# Patient Record
Sex: Male | Born: 1970 | Race: White | Hispanic: No | Marital: Single | Smoking: Former smoker
Health system: Southern US, Community
[De-identification: ages and names within clinical notes are randomized; demographics above are authoritative.]

## PROBLEM LIST (undated history)

## (undated) DIAGNOSIS — F419 Anxiety disorder, unspecified: Secondary | ICD-10-CM

## (undated) DIAGNOSIS — F32A Depression, unspecified: Secondary | ICD-10-CM

## (undated) DIAGNOSIS — E119 Type 2 diabetes mellitus without complications: Secondary | ICD-10-CM

## (undated) DIAGNOSIS — F329 Major depressive disorder, single episode, unspecified: Secondary | ICD-10-CM

## (undated) DIAGNOSIS — I350 Nonrheumatic aortic (valve) stenosis: Secondary | ICD-10-CM

## (undated) DIAGNOSIS — I509 Heart failure, unspecified: Secondary | ICD-10-CM

## (undated) DIAGNOSIS — I4891 Unspecified atrial fibrillation: Secondary | ICD-10-CM

## (undated) DIAGNOSIS — I10 Essential (primary) hypertension: Secondary | ICD-10-CM

## (undated) HISTORY — PX: CARDIAC VALVE REPLACEMENT: SHX585

## (undated) HISTORY — PX: HERNIA REPAIR: SHX51

## (undated) HISTORY — PX: GREATER OMENTAL FLAP CLOSURE: SHX6319

## (undated) HISTORY — PX: OTHER SURGICAL HISTORY: SHX169

## (undated) HISTORY — PX: VALVE REPLACEMENT: SUR13

## (undated) HISTORY — PX: APPLICATION OF WOUND VAC: SHX5189

---

## 2011-08-07 DIAGNOSIS — T8579XA Infection and inflammatory reaction due to other internal prosthetic devices, implants and grafts, initial encounter: Secondary | ICD-10-CM | POA: Insufficient documentation

## 2012-08-18 ENCOUNTER — Emergency Department: Payer: Self-pay | Admitting: Emergency Medicine

## 2012-08-18 LAB — CBC
HCT: 40.9 % (ref 40.0–52.0)
HGB: 13.8 g/dL (ref 13.0–18.0)
MCH: 27.6 pg (ref 26.0–34.0)
MCHC: 33.9 g/dL (ref 32.0–36.0)
Platelet: 257 10*3/uL (ref 150–440)
RDW: 14.3 % (ref 11.5–14.5)

## 2012-08-18 LAB — COMPREHENSIVE METABOLIC PANEL
Albumin: 3.9 g/dL (ref 3.4–5.0)
Alkaline Phosphatase: 69 U/L (ref 50–136)
Anion Gap: 9 (ref 7–16)
BUN: 8 mg/dL (ref 7–18)
Bilirubin,Total: 0.4 mg/dL (ref 0.2–1.0)
Chloride: 104 mmol/L (ref 98–107)
Co2: 24 mmol/L (ref 21–32)
EGFR (Non-African Amer.): >60
Osmolality: 271 (ref 275–301)
Potassium: 3.3 mmol/L — ABNORMAL LOW (ref 3.5–5.1)
SGOT(AST): 13 U/L — ABNORMAL LOW (ref 15–37)
Total Protein: 7.2 g/dL (ref 6.4–8.2)

## 2012-08-18 LAB — TROPONIN I: Troponin-I: <0.02 ng/mL

## 2012-08-19 LAB — TROPONIN I: Troponin-I: <0.02 ng/mL

## 2015-03-18 ENCOUNTER — Inpatient Hospital Stay
Admit: 2015-03-18 | Discharge: 2015-03-18 | Disposition: A | Discharge status: Home / Self Care | Payer: 59 | Attending: Cardiovascular Disease | Admitting: Cardiovascular Disease

## 2015-03-18 ENCOUNTER — Inpatient Hospital Stay
Admission: EM | Admit: 2015-03-18 | Discharge: 2015-03-19 | DRG: 292 | Disposition: A | Discharge status: Home / Self Care | Payer: 59 | Attending: Internal Medicine | Admitting: Internal Medicine

## 2015-03-18 ENCOUNTER — Encounter: Payer: Self-pay | Admitting: Emergency Medicine

## 2015-03-18 ENCOUNTER — Emergency Department: Payer: 59

## 2015-03-18 DIAGNOSIS — F17210 Nicotine dependence, cigarettes, uncomplicated: Secondary | ICD-10-CM | POA: Diagnosis present

## 2015-03-18 DIAGNOSIS — Z79899 Other long term (current) drug therapy: Secondary | ICD-10-CM | POA: Diagnosis not present

## 2015-03-18 DIAGNOSIS — R7989 Other specified abnormal findings of blood chemistry: Secondary | ICD-10-CM

## 2015-03-18 DIAGNOSIS — R0600 Dyspnea, unspecified: Secondary | ICD-10-CM

## 2015-03-18 DIAGNOSIS — E089 Diabetes mellitus due to underlying condition without complications: Secondary | ICD-10-CM

## 2015-03-18 DIAGNOSIS — I352 Nonrheumatic aortic (valve) stenosis with insufficiency: Secondary | ICD-10-CM | POA: Diagnosis present

## 2015-03-18 DIAGNOSIS — I959 Hypotension, unspecified: Secondary | ICD-10-CM | POA: Diagnosis present

## 2015-03-18 DIAGNOSIS — M7989 Other specified soft tissue disorders: Secondary | ICD-10-CM

## 2015-03-18 DIAGNOSIS — Z6841 Body Mass Index (BMI) 40.0 and over, adult: Secondary | ICD-10-CM

## 2015-03-18 DIAGNOSIS — F17200 Nicotine dependence, unspecified, uncomplicated: Secondary | ICD-10-CM | POA: Diagnosis present

## 2015-03-18 DIAGNOSIS — F419 Anxiety disorder, unspecified: Secondary | ICD-10-CM | POA: Diagnosis present

## 2015-03-18 DIAGNOSIS — I248 Other forms of acute ischemic heart disease: Secondary | ICD-10-CM | POA: Diagnosis present

## 2015-03-18 DIAGNOSIS — R0601 Orthopnea: Secondary | ICD-10-CM

## 2015-03-18 DIAGNOSIS — I110 Hypertensive heart disease with heart failure: Secondary | ICD-10-CM | POA: Diagnosis not present

## 2015-03-18 DIAGNOSIS — E119 Type 2 diabetes mellitus without complications: Secondary | ICD-10-CM | POA: Diagnosis present

## 2015-03-18 DIAGNOSIS — G4730 Sleep apnea, unspecified: Secondary | ICD-10-CM | POA: Diagnosis present

## 2015-03-18 DIAGNOSIS — I5031 Acute diastolic (congestive) heart failure: Secondary | ICD-10-CM | POA: Insufficient documentation

## 2015-03-18 DIAGNOSIS — I509 Heart failure, unspecified: Secondary | ICD-10-CM

## 2015-03-18 DIAGNOSIS — Z7984 Long term (current) use of oral hypoglycemic drugs: Secondary | ICD-10-CM

## 2015-03-18 DIAGNOSIS — E785 Hyperlipidemia, unspecified: Secondary | ICD-10-CM | POA: Diagnosis present

## 2015-03-18 DIAGNOSIS — E66813 Obesity, class 3: Secondary | ICD-10-CM

## 2015-03-18 DIAGNOSIS — R778 Other specified abnormalities of plasma proteins: Secondary | ICD-10-CM

## 2015-03-18 DIAGNOSIS — Z720 Tobacco use: Secondary | ICD-10-CM

## 2015-03-18 HISTORY — DX: Type 2 diabetes mellitus without complications: E11.9

## 2015-03-18 HISTORY — DX: Essential (primary) hypertension: I10

## 2015-03-18 HISTORY — DX: Anxiety disorder, unspecified: F41.9

## 2015-03-18 LAB — CBC
HEMATOCRIT: 37.6 % — AB (ref 40.0–52.0)
HEMOGLOBIN: 12 g/dL — AB (ref 13.0–18.0)
MCH: 26 pg (ref 26.0–34.0)
MCHC: 32 g/dL (ref 32.0–36.0)
MCV: 81.2 fL (ref 80.0–100.0)
PLATELETS: 293 10*3/uL (ref 150–440)
RBC: 4.63 MIL/uL (ref 4.40–5.90)
RDW: 17 % — AB (ref 11.5–14.5)
WBC: 9.6 10*3/uL (ref 3.8–10.6)

## 2015-03-18 LAB — COMPREHENSIVE METABOLIC PANEL
ALBUMIN: 4 g/dL (ref 3.5–5.0)
ALK PHOS: 64 U/L (ref 38–126)
ALT: 28 U/L (ref 17–63)
ANION GAP: 8 (ref 5–15)
AST: 19 U/L (ref 15–41)
BILIRUBIN TOTAL: 0.9 mg/dL (ref 0.3–1.2)
BUN: 15 mg/dL (ref 6–20)
CALCIUM: 9.2 mg/dL (ref 8.9–10.3)
CO2: 25 mmol/L (ref 22–32)
Chloride: 106 mmol/L (ref 101–111)
Creatinine, Ser: 0.55 mg/dL — ABNORMAL LOW (ref 0.61–1.24)
Glucose, Bld: 114 mg/dL — ABNORMAL HIGH (ref 65–99)
POTASSIUM: 4 mmol/L (ref 3.5–5.1)
Sodium: 139 mmol/L (ref 135–145)
TOTAL PROTEIN: 6.9 g/dL (ref 6.5–8.1)

## 2015-03-18 LAB — FIBRIN DERIVATIVES D-DIMER (ARMC ONLY): FIBRIN DERIVATIVES D-DIMER (ARMC): 863 — AB (ref 0–499)

## 2015-03-18 LAB — CBC WITH DIFFERENTIAL/PLATELET
BASOS ABS: 0.1 10*3/uL (ref 0–0.1)
BASOS PCT: 1 %
Eosinophils Absolute: 0.1 10*3/uL (ref 0–0.7)
Eosinophils Relative: 2 %
HEMATOCRIT: 36.4 % — AB (ref 40.0–52.0)
HEMOGLOBIN: 11.5 g/dL — AB (ref 13.0–18.0)
Lymphocytes Relative: 21 %
Lymphs Abs: 1.7 10*3/uL (ref 1.0–3.6)
MCH: 26.3 pg (ref 26.0–34.0)
MCHC: 31.6 g/dL — ABNORMAL LOW (ref 32.0–36.0)
MCV: 83.3 fL (ref 80.0–100.0)
MONOS PCT: 6 %
Monocytes Absolute: 0.5 10*3/uL (ref 0.2–1.0)
NEUTROS ABS: 5.7 10*3/uL (ref 1.4–6.5)
Neutrophils Relative %: 70 %
Platelets: 270 10*3/uL (ref 150–440)
RBC: 4.37 MIL/uL — ABNORMAL LOW (ref 4.40–5.90)
RDW: 17.3 % — ABNORMAL HIGH (ref 11.5–14.5)
WBC: 7.9 10*3/uL (ref 3.8–10.6)

## 2015-03-18 LAB — CREATININE, SERUM
CREATININE: 0.56 mg/dL — AB (ref 0.61–1.24)
GFR calc Af Amer: >60 mL/min (ref >60)
GFR calc non Af Amer: >60 mL/min (ref >60)

## 2015-03-18 LAB — TSH: TSH: 1.459 u[IU]/mL (ref 0.350–4.500)

## 2015-03-18 LAB — GLUCOSE, CAPILLARY
GLUCOSE-CAPILLARY: 85 mg/dL (ref 65–99)
GLUCOSE-CAPILLARY: 101 mg/dL — AB (ref 65–99)
Glucose-Capillary: 91 mg/dL (ref 65–99)

## 2015-03-18 LAB — HEMOGLOBIN A1C: Hgb A1c MFr Bld: 5.7 % (ref 4.0–6.0)

## 2015-03-18 LAB — TROPONIN I
TROPONIN I: 0.06 ng/mL — AB (ref <0.031)
TROPONIN I: 0.05 ng/mL — AB (ref <0.031)
Troponin I: 0.06 ng/mL — ABNORMAL HIGH (ref <0.031)

## 2015-03-18 LAB — BRAIN NATRIURETIC PEPTIDE: B NATRIURETIC PEPTIDE 5: 206 pg/mL — AB (ref 0.0–100.0)

## 2015-03-18 LAB — LIPASE, BLOOD: LIPASE: 24 U/L (ref 11–51)

## 2015-03-18 MED ORDER — ACETAMINOPHEN 650 MG RE SUPP
650.0000 mg | Freq: Four times a day (QID) | RECTAL | Status: DC | PRN
Start: 2015-03-18 — End: 2015-03-19

## 2015-03-18 MED ORDER — METFORMIN HCL 500 MG PO TABS
500.0000 mg | ORAL_TABLET | Freq: Two times a day (BID) | ORAL | Status: DC
  Administered 2015-03-18 – 2015-03-19 (×2): 500 mg via ORAL
  Filled 2015-03-18 (×2): qty 1

## 2015-03-18 MED ORDER — BISACODYL 10 MG RE SUPP: 10.0000 mg | Freq: Every day | RECTAL | Status: DC | PRN

## 2015-03-18 MED ORDER — LISINOPRIL 10 MG PO TABS
10.0000 mg | ORAL_TABLET | Freq: Every day | ORAL | Status: DC
  Administered 2015-03-19: 10 mg via ORAL
  Filled 2015-03-18: qty 1

## 2015-03-18 MED ORDER — ENOXAPARIN SODIUM 40 MG/0.4ML ~~LOC~~ SOLN
40.0000 mg | Freq: Two times a day (BID) | SUBCUTANEOUS | Status: DC
  Filled 2015-03-18: qty 0.4

## 2015-03-18 MED ORDER — ONDANSETRON HCL 4 MG/2ML IJ SOLN: 4.0000 mg | Freq: Four times a day (QID) | INTRAMUSCULAR | Status: DC | PRN

## 2015-03-18 MED ORDER — IOHEXOL 350 MG/ML SOLN
125.0000 mL | Freq: Once | INTRAVENOUS | Status: AC | PRN
  Administered 2015-03-18: 125 mL via INTRAVENOUS

## 2015-03-18 MED ORDER — FUROSEMIDE 10 MG/ML IJ SOLN
20.0000 mg | Freq: Two times a day (BID) | INTRAMUSCULAR | Status: DC
  Administered 2015-03-19: 20 mg via INTRAVENOUS
  Filled 2015-03-18: qty 2

## 2015-03-18 MED ORDER — PANTOPRAZOLE SODIUM 40 MG PO TBEC
40.0000 mg | DELAYED_RELEASE_TABLET | Freq: Every day | ORAL | Status: DC
  Filled 2015-03-18 (×2): qty 1

## 2015-03-18 MED ORDER — INSULIN ASPART 100 UNIT/ML ~~LOC~~ SOLN: 0.0000 [IU] | Freq: Three times a day (TID) | SUBCUTANEOUS | Status: DC

## 2015-03-18 MED ORDER — NICOTINE 21 MG/24HR TD PT24
21.0000 mg | MEDICATED_PATCH | Freq: Every day | TRANSDERMAL | Status: DC
  Administered 2015-03-18 – 2015-03-19 (×2): 21 mg via TRANSDERMAL
  Filled 2015-03-18 (×2): qty 1

## 2015-03-18 MED ORDER — ASPIRIN 81 MG PO CHEW
324.0000 mg | CHEWABLE_TABLET | Freq: Once | ORAL | Status: AC
  Administered 2015-03-18: 324 mg via ORAL
  Filled 2015-03-18: qty 4

## 2015-03-18 MED ORDER — SENNOSIDES-DOCUSATE SODIUM 8.6-50 MG PO TABS: 1.0000 | ORAL_TABLET | Freq: Every evening | ORAL | Status: DC | PRN

## 2015-03-18 MED ORDER — LORAZEPAM 1 MG PO TABS
1.0000 mg | ORAL_TABLET | Freq: Two times a day (BID) | ORAL | Status: DC
  Administered 2015-03-18 – 2015-03-19 (×2): 1 mg via ORAL
  Filled 2015-03-18 (×2): qty 1

## 2015-03-18 MED ORDER — OXYCODONE HCL 5 MG PO TABS: 5.0000 mg | ORAL_TABLET | ORAL | Status: DC | PRN

## 2015-03-18 MED ORDER — FUROSEMIDE 10 MG/ML IJ SOLN
40.0000 mg | Freq: Once | INTRAMUSCULAR | Status: AC
  Administered 2015-03-18: 40 mg via INTRAVENOUS
  Filled 2015-03-18: qty 4

## 2015-03-18 MED ORDER — ACETAMINOPHEN 325 MG PO TABS
650.0000 mg | ORAL_TABLET | Freq: Four times a day (QID) | ORAL | Status: DC | PRN
Start: 2015-03-18 — End: 2015-03-19
  Administered 2015-03-18: 650 mg via ORAL
  Filled 2015-03-18: qty 2

## 2015-03-18 MED ORDER — ONDANSETRON HCL 4 MG PO TABS: 4.0000 mg | ORAL_TABLET | Freq: Four times a day (QID) | ORAL | Status: DC | PRN

## 2015-03-18 NOTE — Progress Notes (Signed)
Anticoagulation monitoring(Lovenox):  45 yo  ordered Lovenox 40 mg Q24h  Filed Weights   03/18/15 0710 03/18/15 1403  Weight: 395 lb (179.171 kg) 391 lb 8 oz (177.583 kg)   BMI 52  Lab Results  Component Value Date   CREATININE 0.55* 03/18/2015   CREATININE 0.67 08/18/2012   Estimated Creatinine Clearance: 198 mL/min (by C-G formula based on Cr of 0.55). Hemoglobin & Hematocrit     Component Value Date/Time   HGB 11.5* 03/18/2015 0745   HGB 13.8 08/18/2012 1817   HCT 36.4* 03/18/2015 0745   HCT 40.9 08/18/2012 1817     Per Protocol for Patient with estCrcl > 30 ml/min and BMI < 40, will transition to Lovenox 40 mg Q 12 h.  Lenis Noon, PharmD Clinical Pharmacist 2:22 PM

## 2015-03-18 NOTE — ED Notes (Addendum)
Pt states he has been having pain for about 2 mos. Hx of hernia surgery x3. Pt has appt with PCP today and feels he wont get the answers he needs so he came to ED. Pt is having multiple symptoms including SOB,fatigue, tingling in fingers, leg cramps and urinary frequency. Pt recently had bronchitis and believes the cough may have caused some of his symptoms.

## 2015-03-18 NOTE — ED Provider Notes (Signed)
[facility] Emergency Department Provider Note  ____________________________________________  Time seen: Approximately 9:14 AM  I have reviewed the triage vital signs and the nursing notes.   HISTORY  Chief Complaint Abdominal Pain    HPI Andres Kawczynski is a 45 y.o. male history of diabetes, hypertension.  Patient presents today for evaluation of intermittent discomfort in the abdomen for about 2 months. Reports that he gets an occasional stinging or burning discomfort across the lower abdomen, freely while at work. He also reports that for about the last 2 months she's been feeling times where he is slightly short of breath, slightly more tired than normal, occasionally get muscle cramps and some slight tingling in both hands.  Portable present time he is not having any abdominal pain. He does have occasional constipation, but denies having this presently. No fevers or chills. No chest pain. Does report he feels slightly more short of breath when he is at home trying to sleep and reclined in the bed, however he does not notice any shortness of breath while at work. 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  No history of any blood clots, recent surgeries, coughing up blood, chest pain, or new unilateral leg swelling.   Past Medical History  Diagnosis Date  . Diabetes mellitus without complication ([place])   . Hypertension   . Anxiety     Patient Active Problem List   Diagnosis Date Noted  . CHF (congestive heart failure) ([place]) 03/18/2015    Past Surgical History  Procedure Laterality Date  . Hernia repair      X3    No current outpatient prescriptions on file.  Allergies Review of patient's allergies indicates no known allergies.  Family History  Problem Relation Age of Onset  . Hypertension Mother   . Hypertension Maternal Grandmother   . Diabetes Maternal  Grandmother     Social History Social History  Substance Use Topics  . Smoking status: Current Every Day Smoker  . Smokeless tobacco: None  . Alcohol Use: No    Review of Systems Constitutional: No fever/chills Eyes: No visual changes. ENT: No sore throat. Cardiovascular: Denies chest pain. Respiratory: See history of present illness. Only notices shortness of breath primarily when he is trying to lay down in the bed at night, it is improved slightly with laying in a recliner. Gastrointestinal: See history of present illness. No nausea, no vomiting.  No diarrhea.  No constipation. Reports 3 previous abdominal surgeries for hernia repairs. Genitourinary: Negative for dysuria. Musculoskeletal: Negative for back pain. Skin: Negative for rash. Neurological: Negative for headaches, focal weakness or numbness.  10-point ROS otherwise negative.  ____________________________________________   PHYSICAL EXAM:  VITAL SIGNS: ED Triage Vitals  Enc Vitals Group     BP 03/18/15 0710 123/48 mmHg     Pulse Rate 03/18/15 0710 82     Resp 03/18/15 0710 20     Temp 03/18/15 0710 97.7 F (36.5 C)     Temp Source 03/18/15 0710 Oral     SpO2 03/18/15 0710 96 %     Weight 03/18/15 0710 395 lb (179.171 kg)     Height 03/18/15 0710 6' (1.829 m)     Head Cir --      Peak Flow --      Pain Score 03/18/15 0710 7     Pain Loc --      Pain Edu? --  Excl. in [place]? --    Constitutional: Alert and oriented. Well appearing and in no acute distress. Eyes: Conjunctivae are normal. PERRL. EOMI. Head: Atraumatic. Nose: No congestion/rhinnorhea. Mouth/Throat: Mucous membranes are moist.  Oropharynx non-erythematous. Neck: No stridor.   Cardiovascular: Normal rate, regular rhythm. Grossly normal heart sounds.  Good peripheral circulation. Respiratory: Normal respiratory effort.  No retractions. Lungs CTAB. Speaks in full and clear sentences. Gastrointestinal: Soft and nontender with a noted ventral  hernia when he sits up, which reduces when he lays back down. No distention. Old surgical scars across the lower abdomen are intact. No abdominal pain at this time. No peritonitis. No evidence of active incarcerated hernia. Musculoskeletal: 1+ bilateral lower extremity edema. No joint effusions. Neurologic:  Normal speech and language. No gross focal neurologic deficits are appreciated. Patient able to ambulate to radiology and back without difficulty. Trending pulse ox 96% while ambulating. Skin:  Skin is warm, dry and intact. No rash noted. Psychiatric: Mood and affect are normal. Speech and behavior are normal.  ____________________________________________   LABS (all labs ordered are listed, but only abnormal results are displayed)  Labs Reviewed  CBC WITH DIFFERENTIAL/PLATELET - Abnormal; Notable for the following:    RBC 4.37 (*)    Hemoglobin 11.5 (*)    HCT 36.4 (*)    MCHC 31.6 (*)    RDW 17.3 (*)    All other components within normal limits  COMPREHENSIVE METABOLIC PANEL - Abnormal; Notable for the following:    Glucose, Bld 114 (*)    Creatinine, Ser 0.55 (*)    All other components within normal limits  FIBRIN DERIVATIVES D-DIMER ([facility] ONLY) - Abnormal; Notable for the following:    Fibrin derivatives D-dimer ([facility]) 863 (*)    All other components within normal limits  TROPONIN I - Abnormal; Notable for the following:    Troponin I 0.06 (*)    All other components within normal limits  BRAIN NATRIURETIC PEPTIDE - Abnormal; Notable for the following:    B Natriuretic Peptide 206.0 (*)    All other components within normal limits  CBC - Abnormal; Notable for the following:    Hemoglobin 12.0 (*)    HCT 37.6 (*)    RDW 17.0 (*)    All other components within normal limits  CREATININE, SERUM - Abnormal; Notable for the following:    Creatinine, Ser 0.56 (*)    All other components within normal limits  TROPONIN I - Abnormal; Notable for the following:    Troponin I  0.05 (*)    All other components within normal limits  LIPASE, BLOOD  HEMOGLOBIN A1C  GLUCOSE, CAPILLARY  TSH  GLUCOSE, CAPILLARY  URINALYSIS COMPLETEWITH MICROSCOPIC ([facility] ONLY)  TROPONIN I  TROPONIN I   ____________________________________________  EKG  Reviewed and interpreted by me at 10 AM Normal sinus rhythm Heart rate 73 QT 420 QRS 100 PR 160 T-wave inversions noted in lead 1 and aVL, otherwise some mild artifact without evidence of acute ST abnormality. Reviewed and interpreted as normal sinus rhythm with possible ischemic versus nonspecific T-wave abnormality seen in 1, aVL ____________________________________________  RADIOLOGY  IMPRESSION: No evidence pulmonary embolism.  Suspected early/mild interstitial edema, although without pleural effusions.  Mild thoracic lymphadenopathy, including partially calcified nodes, possibly sequela of prior granulomatous disease.  No evidence of bowel obstruction. Normal appendix.  2.5 cm benign right adrenal adenoma.  Additional ancillary findings as above.   Electronically Signed By: Julian Hy M.D. On: 03/18/2015 11:23 ____________________________________________  PROCEDURES  Procedure(s) performed: None  Critical Care performed: No  ____________________________________________   INITIAL IMPRESSION / ASSESSMENT AND PLAN / ED COURSE  Pertinent labs & imaging results that were available during my care of the patient were reviewed by me and considered in my medical decision making (see chart for details).   ____________________________________________   FINAL CLINICAL IMPRESSION(S) / ED DIAGNOSES  Final diagnoses:  Dyspnea  Congestive heart failure, unspecified congestive heart failure chronicity, unspecified congestive heart failure type (HCC)      Delman Kitten, MD 03/18/15 1740

## 2015-03-18 NOTE — ED Notes (Signed)
Pt reports multiple abdominal surgeries in 2013 (2 hernia).  Currently c/o lower abdominal pain and feels like he has another hernia.  C/o being constipated and blood in stool (possible hemorrhoids per pt). Feels a lot of pressure in lower abdomen and reports this makes it hard to breath.

## 2015-03-18 NOTE — Progress Notes (Signed)
AGREE WITH IV LASIX, WILL DO ECHO TO EVALUATE EF. MILDLY ELEVATED, WITH EKG SHOWING NSR WITH PRWP, NON-SPECIFIC ST AND T CHANGES.

## 2015-03-18 NOTE — Progress Notes (Signed)
Patient admitted to unit. Oriented to room, call bell, and staff. Bed in lowest position. Fall safety plan reviewed. Full assessment to Epic. Skin assessment verified with --Carlyle Dolly RN---. Telemetry box verification with tele clerk and Gerald Stabs NT- Box#: [id number]. Will continue to monitor.

## 2015-03-18 NOTE — ED Notes (Signed)
MD Quale at bedside. 

## 2015-03-18 NOTE — ED Notes (Signed)
Pt ambulated with O2 monitoring. Initially O2 at 93% but maintained at an average of 96% during ambulation.

## 2015-03-18 NOTE — H&P (Signed)
[facility] [place] NAME: Russell Ray    MR#:  [id number]  DATE OF BIRTH:  [birth year]  DATE OF ADMISSION:  03/18/2015  PRIMARY CARE PHYSICIAN: Volanda Napoleon, MD   REQUESTING/REFERRING PHYSICIAN: Quale  CHIEF COMPLAINT:  Increasing shortness of breath and leg swelling and abdominal wall edema for the last 2 months.  HISTORY OF PRESENT ILLNESS:  Russell Ray  is a 45 y.o. male with a known history of morbid obesity, type 2 diabetes, heavy tobacco abuse, comes to the emergency room accompanied by his wife with increasing shortness of breath and cough with leg edema and abdominal wall swelling for progressively worsening for the last 2 months. Patient reports smoking heavily. In the emergency room patient was found to have leg edema. He is receiving 40 minutes of IV Lasix. Patient is being admitted with a possible new onset congestive heart failure acute.  PAST MEDICAL HISTORY:   Past Medical History  Diagnosis Date  . Diabetes mellitus without complication ([place])   . Hypertension   . Anxiety     PAST SURGICAL HISTOIRY:   Past Surgical History  Procedure Laterality Date  . Hernia repair      X3    SOCIAL HISTORY:   Social History  Substance Use Topics  . Smoking status: Current Every Day Smoker  . Smokeless tobacco: Not on file  . Alcohol Use: No    FAMILY HISTORY:  Hypertension diabetes runs in the family.  DRUG ALLERGIES:  No Known Allergies  REVIEW OF SYSTEMS:  Review of Systems  Constitutional: Negative for fever, chills and weight loss.  HENT: Negative for ear discharge, ear pain and nosebleeds.   Eyes: Negative for blurred vision, pain and discharge.  Respiratory: Positive for cough and shortness of breath. Negative for sputum production, wheezing and stridor.   Cardiovascular: Positive for leg swelling. Negative for chest pain, palpitations, orthopnea and PND.  Gastrointestinal: Negative for nausea,  vomiting, abdominal pain and diarrhea.  Genitourinary: Negative for urgency and frequency.  Musculoskeletal: Negative for back pain and joint pain.  Neurological: Positive for weakness. Negative for sensory change, speech change and focal weakness.  Psychiatric/Behavioral: Negative for depression and hallucinations. The patient is not nervous/anxious.   All other systems reviewed and are negative.    MEDICATIONS AT HOME:   Prior to Admission medications   Medication Sig Start Date End Date Taking? Authorizing Provider  lisinopril (PRINIVIL,ZESTRIL) 10 MG tablet Take 10 mg by mouth daily. 03/02/15  Yes Historical Provider, MD  LORazepam (ATIVAN) 1 MG tablet Take 1 mg by mouth 2 (two) times daily. 03/11/15  Yes Historical Provider, MD  metFORMIN (GLUCOPHAGE) 500 MG tablet Take 500 mg by mouth 2 (two) times daily. 03/08/15  Yes Historical Provider, MD  omeprazole (PRILOSEC) 40 MG capsule Take 1 capsule by mouth daily. 03/02/15  Yes Historical Provider, MD      VITAL SIGNS:  Blood pressure 95/37, pulse 73, temperature 97.7 F (36.5 C), temperature source Oral, resp. rate 14, height 6' (1.829 m), weight 179.171 kg (395 lb), SpO2 97 %.  PHYSICAL EXAMINATION:  GENERAL:  45 y.o.-year-old patient lying in the bed with no acute distress. Morbidly obese  EYES: Pupils equal, round, reactive to light and accommodation. No scleral icterus. Extraocular muscles intact.  HEENT: Head atraumatic, normocephalic. Oropharynx and nasopharynx clear.  NECK:  Supple, no jugular venous distention. No thyroid enlargement, no tenderness.  LUNGS Distant  breath sounds bilaterally, no wheezing, rales,rhonchi or  crepitation. No use of accessory muscles of respiration.  CARDIOVASCULAR: S1, S2 normal. No murmurs, rubs, or gallops.  ABDOMEN: Soft, nontender, nondistended. Bowel sounds present. No organomegaly or mass.  EXTREMITIES:++ pedal edema, no cyanosis, or clubbing.  NEUROLOGIC: Cranial nerves II through XII are  intact. Muscle strength 5/5 in all extremities. Sensation intact. Gait not checked.  PSYCHIATRIC: The patient is alert and oriented x 3.  SKIN: No obvious rash, lesion, or ulcer.   LABORATORY PANEL:   CBC  Recent Labs Lab 03/18/15 0745  WBC 7.9  HGB 11.5*  HCT 36.4*  PLT 270   ------------------------------------------------------------------------------------------------------------------  Chemistries   Recent Labs Lab 03/18/15 0745  NA 139  K 4.0  CL 106  CO2 25  GLUCOSE 114*  BUN 15  CREATININE 0.55*  CALCIUM 9.2  AST 19  ALT 28  ALKPHOS 64  BILITOT 0.9   ------------------------------------------------------------------------------------------------------------------  Cardiac Enzymes  Recent Labs Lab 03/18/15 0745  TROPONINI 0.06*   ------------------------------------------------------------------------------------------------------------------  RADIOLOGY:  Dg Chest 2 View  03/18/2015  CLINICAL DATA:  Progressive shortness of breath. Abdominal distention constipation. Intermittent pain and constipation for 2 weeks. EXAM: CHEST - 2 VIEW COMPARISON:  Two-view chest x-ray a 08/19/2014. FINDINGS: The heart is mildly enlarged. A diffuse interstitial pattern suggests mild edema. Small effusions are present bilaterally as well. Minimal atelectasis is present at the bases without significant airspace consolidation. Atherosclerotic changes are present at the aortic arch. Emphysematous changes are again noted. Ankylosis of the thoracic spine is present. IMPRESSION: 1. Mild cardiomegaly with new edema and small effusions suggesting early congestive heart failure. 2. Emphysema. 3. No significant airspace consolidation. Electronically Signed   By: San Morelle M.D.   On: 03/18/2015 09:35   Ct Angio Chest Pe W/cm &/or Wo Cm  03/18/2015  CLINICAL DATA:  Intermittent upper abdominal pain, constipation, shortness of breath, cough EXAM: CT ANGIOGRAPHY CHEST CT ABDOMEN  AND PELVIS WITH CONTRAST TECHNIQUE: Multidetector CT imaging of the chest was performed using the standard protocol during bolus administration of intravenous contrast. Multiplanar CT image reconstructions and MIPs were obtained to evaluate the vascular anatomy. Multidetector CT imaging of the abdomen and pelvis was performed using the standard protocol during bolus administration of intravenous contrast. CONTRAST:  147mL OMNIPAQUE IOHEXOL 350 MG/ML SOLN COMPARISON:  None. FINDINGS: CTA CHEST FINDINGS No evidence of pulmonary embolism. Mediastinum/Nodes: Cardiomegaly.  No pericardial effusion. Atherosclerotic calcifications of the aortic arch. Thoracic lymphadenopathy, including: --11 mm short axis node at the left thoracic inlet (series 6/image 18) --15 mm short axis high right paratracheal node (series 6/ image 36) --16 mm short axis AP window node, partially calcified (series 6/ image 46) --9 mm short axis low right paratracheal node (series 6/ image 48) --22 mm short axis right azygoesophageal recess node (series 6/image 72) Visualized thyroid is unremarkable. Lungs/Pleura: Wedge-shaped ground-glass opacity at the right lung apex (series 10/ image 22), favored to reflect right apical pleural parenchymal scarring. Interlobular septal thickening with ground-glass opacity/mosaic attenuation, favored to reflect mild interstitial edema. Mild subpleural reticulation. Mild patchy opacities at the lung bases, likely atelectasis. No pleural effusions. No pneumothorax. Musculoskeletal: Degenerative changes of the thoracic spine. Exaggerated lower thoracic kyphosis. CT ABDOMEN PELVIS FINDINGS Hepatobiliary: Heterogeneous perfusion of the liver. Gallbladder is unremarkable. No intrahepatic or extrahepatic ductal dilatation. Pancreas: Within normal limits. Spleen: Within normal limits. Adrenals/Urinary Tract: 2.5 cm right adrenal nodule (series 5/ image 30) with greater than 40% relative washout, compatible with a benign  adrenal adenoma. Left adrenal gland is within  normal limits. Two hypoenhancing lesions in the low right lower kidney measuring 1.5 and 1.1 cm (series 9/ images 39 and 42), possibly reflecting small cysts but technically indeterminate. Left kidney is within normal limits.  No hydronephrosis. Bladder is underdistended but unremarkable. Stomach/Bowel: Stomach is within normal limits. No evidence of bowel obstruction. Normal appendix (series 5/image 72). Vascular/Lymphatic: No evidence of abdominal aortic aneurysm. Small left para-aortic nodes which do not meet pathologic CT size criteria. Reproductive: Prostate is unremarkable. Other: No abdominopelvic ascites. Small fat containing right inguinal hernia. Fat within the left inguinal canal. Musculoskeletal: Degenerative changes of the visualized thoracolumbar spine. Review of the MIP images confirms the above findings. IMPRESSION: No evidence pulmonary embolism. Suspected early/mild interstitial edema, although without pleural effusions. Mild thoracic lymphadenopathy, including partially calcified nodes, possibly sequela of prior granulomatous disease. No evidence of bowel obstruction.  Normal appendix. 2.5 cm benign right adrenal adenoma. Additional ancillary findings as above. Electronically Signed   By: Julian Hy M.D.   On: 03/18/2015 11:23   Ct Abdomen Pelvis W Contrast  03/18/2015  CLINICAL DATA:  Intermittent upper abdominal pain, constipation, shortness of breath, cough EXAM: CT ANGIOGRAPHY CHEST CT ABDOMEN AND PELVIS WITH CONTRAST TECHNIQUE: Multidetector CT imaging of the chest was performed using the standard protocol during bolus administration of intravenous contrast. Multiplanar CT image reconstructions and MIPs were obtained to evaluate the vascular anatomy. Multidetector CT imaging of the abdomen and pelvis was performed using the standard protocol during bolus administration of intravenous contrast. CONTRAST:  151mL OMNIPAQUE IOHEXOL 350 MG/ML  SOLN COMPARISON:  None. FINDINGS: CTA CHEST FINDINGS No evidence of pulmonary embolism. Mediastinum/Nodes: Cardiomegaly.  No pericardial effusion. Atherosclerotic calcifications of the aortic arch. Thoracic lymphadenopathy, including: --11 mm short axis node at the left thoracic inlet (series 6/image 18) --15 mm short axis high right paratracheal node (series 6/ image 36) --16 mm short axis AP window node, partially calcified (series 6/ image 46) --9 mm short axis low right paratracheal node (series 6/ image 48) --22 mm short axis right azygoesophageal recess node (series 6/image 72) Visualized thyroid is unremarkable. Lungs/Pleura: Wedge-shaped ground-glass opacity at the right lung apex (series 10/ image 22), favored to reflect right apical pleural parenchymal scarring. Interlobular septal thickening with ground-glass opacity/mosaic attenuation, favored to reflect mild interstitial edema. Mild subpleural reticulation. Mild patchy opacities at the lung bases, likely atelectasis. No pleural effusions. No pneumothorax. Musculoskeletal: Degenerative changes of the thoracic spine. Exaggerated lower thoracic kyphosis. CT ABDOMEN PELVIS FINDINGS Hepatobiliary: Heterogeneous perfusion of the liver. Gallbladder is unremarkable. No intrahepatic or extrahepatic ductal dilatation. Pancreas: Within normal limits. Spleen: Within normal limits. Adrenals/Urinary Tract: 2.5 cm right adrenal nodule (series 5/ image 30) with greater than 40% relative washout, compatible with a benign adrenal adenoma. Left adrenal gland is within normal limits. Two hypoenhancing lesions in the low right lower kidney measuring 1.5 and 1.1 cm (series 9/ images 39 and 42), possibly reflecting small cysts but technically indeterminate. Left kidney is within normal limits.  No hydronephrosis. Bladder is underdistended but unremarkable. Stomach/Bowel: Stomach is within normal limits. No evidence of bowel obstruction. Normal appendix (series 5/image 72).  Vascular/Lymphatic: No evidence of abdominal aortic aneurysm. Small left para-aortic nodes which do not meet pathologic CT size criteria. Reproductive: Prostate is unremarkable. Other: No abdominopelvic ascites. Small fat containing right inguinal hernia. Fat within the left inguinal canal. Musculoskeletal: Degenerative changes of the visualized thoracolumbar spine. Review of the MIP images confirms the above findings. IMPRESSION: No evidence pulmonary embolism. Suspected early/mild interstitial  edema, although without pleural effusions. Mild thoracic lymphadenopathy, including partially calcified nodes, possibly sequela of prior granulomatous disease. No evidence of bowel obstruction.  Normal appendix. 2.5 cm benign right adrenal adenoma. Additional ancillary findings as above. Electronically Signed   By: Julian Hy M.D.   On: 03/18/2015 11:23   Dg Abd 2 Views  03/18/2015  CLINICAL DATA:  Intermittent pain and constipation for 2 months EXAM: ABDOMEN - 2 VIEW COMPARISON:  None. FINDINGS: Stool volume is moderate. There is no evidence of bowel obstruction or perforation. No concerning intra-abdominal mass effect or calcification. Thoracic spondylosis with bulky endplate spurring. IMPRESSION: Benign abdomen with moderate stool volume. Electronically Signed   By: Monte Fantasia M.D.   On: 03/18/2015 09:36    EKG:  Normal sinus rhythm, nonspecific ST-T changes and T-wave inversion in lateral leads  IMPRESSION AND PLAN:   Russell Ray  is a 45 y.o. male with a known history of morbid obesity, type 2 diabetes, heavy tobacco abuse, comes to the emergency room accompanied by his wife with increasing shortness of breath and cough with leg edema and abdominal wall swelling for progressively worsening for the last 2 months.  1. Progressive increasing shortness of breath, leg edema, bilateral elevated BNP, morbid obesity is suggestive of acute onset congestive heart failure, EF pending Admit to  telemetry Cycle cardiac enzymes 3 IV Lasix 20 mg twice a day monitor I's and O's daily weights 2 g sodium ADA 1800-calorie diet Cardiac consultation with Dr. Laurelyn Sickle Echo  2. Type 2 diabetes -Carb control diet, sliding scale insulin, continue metformin 500 twice a day -Check A1c -Give lower dose of lisinopril for renal protection secondary to history of diabetes will start patient on 2.5 mg.  3. Relative hypotension Patient asymptomatic Continue Lasix  4. Suspected sleep apnea Per wife patient snores significantly and she has noticed he deposits to breathe and that nighttime Patient advised to get outpatient sleep study with his primary care physician Dr. Leonette Nutting  5. DVT prophylaxis Lovenox  6. Tobacco abuse heavy, Patient advised on smoking cessation 4 minutes spent. Nicotine patch ordered    All the records are reviewed and case discussed with ED provider. Management plans discussed with the patient, family(wife ) and they are in agreement.  CODE STATUS: Full code  TOTAL TIME TAKING CARE OF THIS PATIENT: 45  minutes.    Graciano Batson M.D on 03/18/2015 at 12:35 PM  Between 7am to 6pm - Pager - [phone]  After 6pm go to [email or web address] - password [email or web address] [facility]  Office  [phone]  CC: Primary care physician; Volanda Napoleon, MD

## 2015-03-19 DIAGNOSIS — M7989 Other specified soft tissue disorders: Secondary | ICD-10-CM

## 2015-03-19 DIAGNOSIS — Z720 Tobacco use: Secondary | ICD-10-CM

## 2015-03-19 DIAGNOSIS — R778 Other specified abnormalities of plasma proteins: Secondary | ICD-10-CM

## 2015-03-19 DIAGNOSIS — R0600 Dyspnea, unspecified: Secondary | ICD-10-CM

## 2015-03-19 DIAGNOSIS — E66813 Obesity, class 3: Secondary | ICD-10-CM

## 2015-03-19 DIAGNOSIS — R7989 Other specified abnormal findings of blood chemistry: Secondary | ICD-10-CM

## 2015-03-19 DIAGNOSIS — R0601 Orthopnea: Secondary | ICD-10-CM

## 2015-03-19 LAB — URINALYSIS COMPLETE WITH MICROSCOPIC (ARMC ONLY)
Bacteria, UA: NONE SEEN
Bilirubin Urine: NEGATIVE
Glucose, UA: NEGATIVE mg/dL
HGB URINE DIPSTICK: NEGATIVE
KETONES UR: NEGATIVE mg/dL
LEUKOCYTES UA: NEGATIVE
Nitrite: NEGATIVE
PH: 6 (ref 5.0–8.0)
PROTEIN: NEGATIVE mg/dL
RBC / HPF: NONE SEEN RBC/hpf (ref 0–5)
SPECIFIC GRAVITY, URINE: 1.008 (ref 1.005–1.030)
Squamous Epithelial / LPF: NONE SEEN
WBC UA: NONE SEEN WBC/hpf (ref 0–5)

## 2015-03-19 LAB — TROPONIN I: TROPONIN I: 0.06 ng/mL — AB (ref <0.031)

## 2015-03-19 LAB — GLUCOSE, CAPILLARY: GLUCOSE-CAPILLARY: 97 mg/dL (ref 65–99)

## 2015-03-19 MED ORDER — FUROSEMIDE 40 MG PO TABS: 40.0000 mg | ORAL_TABLET | Freq: Every day | ORAL | Status: DC

## 2015-03-19 MED ORDER — SENNOSIDES-DOCUSATE SODIUM 8.6-50 MG PO TABS
1.0000 | ORAL_TABLET | Freq: Every evening | ORAL | Status: DC | PRN
Start: 2015-03-19 — End: 2017-05-13

## 2015-03-19 MED ORDER — ASPIRIN EC 81 MG PO TBEC: 81.0000 mg | DELAYED_RELEASE_TABLET | Freq: Every day | ORAL | Status: DC

## 2015-03-19 MED ORDER — NICOTINE 21 MG/24HR TD PT24: 21.0000 mg | MEDICATED_PATCH | Freq: Every day | TRANSDERMAL | Status: DC

## 2015-03-19 NOTE — Clinical Documentation Improvement (Signed)
Internal Medicine  Can the diagnosis of CHF be further specified?     Acute diastolic CHF    Acute systolic CHF    Other condition __________   Supporting Information: -->H&P, "Progressive increasing shortness of breath, leg edema, bilateral elevated BNP, morbid obesity is suggestive of acute onset congestive heart failure, EF pending. Cycle cardiac enzymes 3, IV Lasix 20 mg twice a day, monitor I's and O's, daily weights. Cardiac consultation with Dr. Laurelyn Sickle. Echo. -->Echo 03/18/15, "Study Conclusions - Left ventricle: The cavity size was normal. Systolic function was normal. The estimated ejection fraction was 65%. Wall motion was normal; there were no regional wall motion abnormalities. Doppler parameters are consistent with abnormal left ventricular relaxation (grade 1 diastolic dysfunction)."       Please update your documentation within the medical record to reflect your response to this query.   Please exercise your independent, professional judgment when responding.  A specific answer is not anticipated or expected.   Thank You,   Posey Pronto, RN, BSN, [place] Clinical Documentation Improvement Specialist HIM department--[place] [phone]

## 2015-03-19 NOTE — Discharge Summary (Signed)
[facility] NAME: Russell Ray    MR#:  [id number]  DATE OF BIRTH:  1970-03-02  DATE OF ADMISSION:  03/18/2015 ADMITTING PHYSICIAN: Fritzi Mandes, MD  DATE OF DISCHARGE: 03/19/2015 12:14 PM  PRIMARY CARE PHYSICIAN: Volanda Napoleon, MD     ADMISSION DIAGNOSIS:  Dyspnea [R06.00] Congestive heart failure, unspecified congestive heart failure chronicity, unspecified congestive heart failure type ([place]) [I50.9]  DISCHARGE DIAGNOSIS:  Principal Problem:   Acute diastolic CHF (congestive heart failure) (HCC) Active Problems:   Morbid obesity (HCC)   Orthopnea   Leg swelling   Dyspnea   Tobacco abuse   Elevated troponin   SECONDARY DIAGNOSIS:   Past Medical History  Diagnosis Date  . Diabetes mellitus without complication ([place])   . Hypertension   . Anxiety     .pro HOSPITAL COURSE:   The patient is a 45 year old Caucasian male with medical history significant for history of morbid obesity,  diabetes, essential hypertension, anxiety who presents to the hospital with complaints of lower extremity and abdominal wall edema, worsening over the past 2 months. In emergency room, patient was noted to have no pulmonary embolism but early/mild interstitial edema without pleural effusions was noted, concerning for CHF. Patient was admitted to the hospital for diuresis. He received Lasix and diuresed total of 4.6 L. With this, he felt much more comfortable and had no shortness of breath. His leg as well as abdominal swelling subsided. Patient was seen by cardiologist. An echocardiogram was performed. Echocardiogram revealed normal ejection fraction, it also showed moderate aortic regurgitation and severe aortic stenosis, elevated pulmonary arterial pressures, grade 1 diastolic dysfunction but no regional wall motion abnormalities. Patient was advised to follow up with Dr. Humphrey Rolls, cardiologist as outpatient within 1 week after discharge  for which patient was agreeable Discussion by problem #1. Acute diastolic CHF due to severe aortic stenosis and moderate aortic regurgitation, patient is to continue ACE inhibitor, Lasix, aspirin was added, patient's to follow up with cardiologist for further recommendations. He will may need to be evaluated by cardiothoracic surgeon #2. Morbid obesity, weight loss was advised, TSH as well as a hemoglobin A1c were unremarkable.  #3. Essential hypertension, well controlled. Continue outpatient medications #4. Elevated troponin, likely demand ischemia, patient is to follow up with cardiologist for further recommendations and therapy  DISCHARGE CONDITIONS:   Stable  CONSULTS OBTAINED:  Treatment Team:  Dionisio David, MD  DRUG ALLERGIES:  No Known Allergies  DISCHARGE MEDICATIONS:   Discharge Medication List as of 03/19/2015 11:05 AM    START taking these medications   Details  aspirin EC 81 MG tablet Take 1 tablet (81 mg total) by mouth daily., Starting 03/19/2015, Until Discontinued, Normal    nicotine (NICODERM CQ - DOSED IN MG/24 HOURS) 21 mg/24hr patch Place 1 patch (21 mg total) onto the skin daily., Starting 03/19/2015, Until Discontinued, Normal    senna-docusate (SENOKOT-S) 8.6-50 MG tablet Take 1 tablet by mouth at bedtime as needed for mild constipation., Starting 03/19/2015, Until Discontinued, Normal      CONTINUE these medications which have NOT CHANGED   Details  lisinopril (PRINIVIL,ZESTRIL) 10 MG tablet Take 10 mg by mouth daily., Starting 03/02/2015, Until Discontinued, Historical Med    LORazepam (ATIVAN) 1 MG tablet Take 1 mg by mouth 2 (two) times daily., Starting 03/11/2015, Until Discontinued, Historical Med    metFORMIN (GLUCOPHAGE) 500 MG tablet Take 500 mg by mouth 2 (two) times daily., Starting 03/08/2015,  Until Discontinued, Historical Med    omeprazole (PRILOSEC) 40 MG capsule Take 1 capsule by mouth daily., Starting 03/02/2015, Until Discontinued, Historical  Med         DISCHARGE INSTRUCTIONS:    Patient is to follow-up with primary care physician and cardiologist as outpatient  If you experience worsening of your admission symptoms, develop shortness of breath, life threatening emergency, suicidal or homicidal thoughts you must seek medical attention immediately by calling 911 or calling your MD immediately  if symptoms less severe.  You Must read complete instructions/literature along with all the possible adverse reactions/side effects for all the Medicines you take and that have been prescribed to you. Take any new Medicines after you have completely understood and accept all the possible adverse reactions/side effects.   Please note  You were cared for by a hospitalist during your hospital stay. If you have any questions about your discharge medications or the care you received while you were in the hospital after you are discharged, you can call the unit and asked to speak with the hospitalist on call if the hospitalist that took care of you is not available. Once you are discharged, your primary care physician will handle any further medical issues. Please note that NO REFILLS for any discharge medications will be authorized once you are discharged, as it is imperative that you return to your primary care physician (or establish a relationship with a primary care physician if you do not have one) for your aftercare needs so that they can reassess your need for medications and monitor your lab values.    Today   CHIEF COMPLAINT:   Chief Complaint  Patient presents with  . Abdominal Pain    HISTORY OF PRESENT ILLNESS:  Russell Ray  is a 45 y.o. male with a known history of morbid obesity,  diabetes, essential hypertension, anxiety who presents to the hospital with complaints of lower extremity and abdominal wall edema, worsening over the past 2 months. In emergency room, patient was noted to have no pulmonary embolism but early/mild  interstitial edema without pleural effusions was noted, concerning for CHF. Patient was admitted to the hospital for diuresis. He received Lasix and diuresed total of 4.6 L. With this, he felt much more comfortable and had no shortness of breath. His leg as well as abdominal swelling subsided. Patient was seen by cardiologist. An echocardiogram was performed. Echocardiogram revealed normal ejection fraction, it also showed moderate aortic regurgitation and severe aortic stenosis, elevated pulmonary arterial pressures, grade 1 diastolic dysfunction but no regional wall motion abnormalities. Patient was advised to follow up with Dr. Humphrey Rolls, cardiologist as outpatient within 1 week after discharge for which patient was agreeable Discussion by problem #1. Acute diastolic CHF due to severe aortic stenosis and moderate aortic regurgitation, patient is to continue ACE inhibitor, Lasix, aspirin was added, patient's to follow up with cardiologist for further recommendations. He will may need to be evaluated by cardiothoracic surgeon #2. Morbid obesity, weight loss was advised, TSH as well as a hemoglobin A1c were unremarkable.  #3. Essential hypertension, well controlled. Continue outpatient medications #4. Elevated troponin, likely demand ischemia, patient is to follow up with cardiologist for further recommendations and therapy    VITAL SIGNS:  Blood pressure 118/56, pulse 78, temperature 97.6 F (36.4 C), temperature source Oral, resp. rate 19, height 6' (1.829 m), weight 177.583 kg (391 lb 8 oz), SpO2 95 %.  I/O:   Intake/Output Summary (Last 24 hours) at 03/19/15 1502 Last  data filed at 03/19/15 1000  Gross per 24 hour  Intake    480 ml  Output   4100 ml  Net  -3620 ml    PHYSICAL EXAMINATION:  GENERAL:  45 y.o.-year-old patient lying in the bed with no acute distress.  EYES: Pupils equal, round, reactive to light and accommodation. No scleral icterus. Extraocular muscles intact.  HEENT: Head  atraumatic, normocephalic. Oropharynx and nasopharynx clear.  NECK:  Supple, no jugular venous distention. No thyroid enlargement, no tenderness.  LUNGS: Normal breath sounds bilaterally, no wheezing, rales,rhonchi or crepitation. No use of accessory muscles of respiration.  CARDIOVASCULAR: S1, S2 distant. No  murmurs, rubs, or gallops.  ABDOMEN: Soft, non-tender, non-distended. Bowel sounds present. No organomegaly or mass.  EXTREMITIES: No pedal edema, cyanosis, or clubbing.  NEUROLOGIC: Cranial nerves II through XII are intact. Muscle strength 5/5 in all extremities. Sensation intact. Gait not checked.  PSYCHIATRIC: The patient is alert and oriented x 3.  SKIN: No obvious rash, lesion, or ulcer.   DATA REVIEW:   CBC  Recent Labs Lab 03/18/15 1420  WBC 9.6  HGB 12.0*  HCT 37.6*  PLT 293    Chemistries   Recent Labs Lab 03/18/15 0745 03/18/15 1420  NA 139  --   K 4.0  --   CL 106  --   CO2 25  --   GLUCOSE 114*  --   BUN 15  --   CREATININE 0.55* 0.56*  CALCIUM 9.2  --   AST 19  --   ALT 28  --   ALKPHOS 64  --   BILITOT 0.9  --     Cardiac Enzymes  Recent Labs Lab 03/19/15 0241  TROPONINI 0.06*    Microbiology Results  No results found for this or any previous visit.  RADIOLOGY:  Dg Chest 2 View  03/18/2015  CLINICAL DATA:  Progressive shortness of breath. Abdominal distention constipation. Intermittent pain and constipation for 2 weeks. EXAM: CHEST - 2 VIEW COMPARISON:  Two-view chest x-ray a 08/19/2014. FINDINGS: The heart is mildly enlarged. A diffuse interstitial pattern suggests mild edema. Small effusions are present bilaterally as well. Minimal atelectasis is present at the bases without significant airspace consolidation. Atherosclerotic changes are present at the aortic arch. Emphysematous changes are again noted. Ankylosis of the thoracic spine is present. IMPRESSION: 1. Mild cardiomegaly with new edema and small effusions suggesting early  congestive heart failure. 2. Emphysema. 3. No significant airspace consolidation. Electronically Signed   By: San Morelle M.D.   On: 03/18/2015 09:35   Ct Angio Chest Pe W/cm &/or Wo Cm  03/18/2015  CLINICAL DATA:  Intermittent upper abdominal pain, constipation, shortness of breath, cough EXAM: CT ANGIOGRAPHY CHEST CT ABDOMEN AND PELVIS WITH CONTRAST TECHNIQUE: Multidetector CT imaging of the chest was performed using the standard protocol during bolus administration of intravenous contrast. Multiplanar CT image reconstructions and MIPs were obtained to evaluate the vascular anatomy. Multidetector CT imaging of the abdomen and pelvis was performed using the standard protocol during bolus administration of intravenous contrast. CONTRAST:  135mL OMNIPAQUE IOHEXOL 350 MG/ML SOLN COMPARISON:  None. FINDINGS: CTA CHEST FINDINGS No evidence of pulmonary embolism. Mediastinum/Nodes: Cardiomegaly.  No pericardial effusion. Atherosclerotic calcifications of the aortic arch. Thoracic lymphadenopathy, including: --11 mm short axis node at the left thoracic inlet (series 6/image 18) --15 mm short axis high right paratracheal node (series 6/ image 36) --16 mm short axis AP window node, partially calcified (series 6/ image 46) --9 mm short  axis low right paratracheal node (series 6/ image 48) --22 mm short axis right azygoesophageal recess node (series 6/image 72) Visualized thyroid is unremarkable. Lungs/Pleura: Wedge-shaped ground-glass opacity at the right lung apex (series 10/ image 22), favored to reflect right apical pleural parenchymal scarring. Interlobular septal thickening with ground-glass opacity/mosaic attenuation, favored to reflect mild interstitial edema. Mild subpleural reticulation. Mild patchy opacities at the lung bases, likely atelectasis. No pleural effusions. No pneumothorax. Musculoskeletal: Degenerative changes of the thoracic spine. Exaggerated lower thoracic kyphosis. CT ABDOMEN PELVIS  FINDINGS Hepatobiliary: Heterogeneous perfusion of the liver. Gallbladder is unremarkable. No intrahepatic or extrahepatic ductal dilatation. Pancreas: Within normal limits. Spleen: Within normal limits. Adrenals/Urinary Tract: 2.5 cm right adrenal nodule (series 5/ image 30) with greater than 40% relative washout, compatible with a benign adrenal adenoma. Left adrenal gland is within normal limits. Two hypoenhancing lesions in the low right lower kidney measuring 1.5 and 1.1 cm (series 9/ images 39 and 42), possibly reflecting small cysts but technically indeterminate. Left kidney is within normal limits.  No hydronephrosis. Bladder is underdistended but unremarkable. Stomach/Bowel: Stomach is within normal limits. No evidence of bowel obstruction. Normal appendix (series 5/image 72). Vascular/Lymphatic: No evidence of abdominal aortic aneurysm. Small left para-aortic nodes which do not meet pathologic CT size criteria. Reproductive: Prostate is unremarkable. Other: No abdominopelvic ascites. Small fat containing right inguinal hernia. Fat within the left inguinal canal. Musculoskeletal: Degenerative changes of the visualized thoracolumbar spine. Review of the MIP images confirms the above findings. IMPRESSION: No evidence pulmonary embolism. Suspected early/mild interstitial edema, although without pleural effusions. Mild thoracic lymphadenopathy, including partially calcified nodes, possibly sequela of prior granulomatous disease. No evidence of bowel obstruction.  Normal appendix. 2.5 cm benign right adrenal adenoma. Additional ancillary findings as above. Electronically Signed   By: Julian Hy M.D.   On: 03/18/2015 11:23   Ct Abdomen Pelvis W Contrast  03/18/2015  CLINICAL DATA:  Intermittent upper abdominal pain, constipation, shortness of breath, cough EXAM: CT ANGIOGRAPHY CHEST CT ABDOMEN AND PELVIS WITH CONTRAST TECHNIQUE: Multidetector CT imaging of the chest was performed using the standard  protocol during bolus administration of intravenous contrast. Multiplanar CT image reconstructions and MIPs were obtained to evaluate the vascular anatomy. Multidetector CT imaging of the abdomen and pelvis was performed using the standard protocol during bolus administration of intravenous contrast. CONTRAST:  177mL OMNIPAQUE IOHEXOL 350 MG/ML SOLN COMPARISON:  None. FINDINGS: CTA CHEST FINDINGS No evidence of pulmonary embolism. Mediastinum/Nodes: Cardiomegaly.  No pericardial effusion. Atherosclerotic calcifications of the aortic arch. Thoracic lymphadenopathy, including: --11 mm short axis node at the left thoracic inlet (series 6/image 18) --15 mm short axis high right paratracheal node (series 6/ image 36) --16 mm short axis AP window node, partially calcified (series 6/ image 46) --9 mm short axis low right paratracheal node (series 6/ image 48) --22 mm short axis right azygoesophageal recess node (series 6/image 72) Visualized thyroid is unremarkable. Lungs/Pleura: Wedge-shaped ground-glass opacity at the right lung apex (series 10/ image 22), favored to reflect right apical pleural parenchymal scarring. Interlobular septal thickening with ground-glass opacity/mosaic attenuation, favored to reflect mild interstitial edema. Mild subpleural reticulation. Mild patchy opacities at the lung bases, likely atelectasis. No pleural effusions. No pneumothorax. Musculoskeletal: Degenerative changes of the thoracic spine. Exaggerated lower thoracic kyphosis. CT ABDOMEN PELVIS FINDINGS Hepatobiliary: Heterogeneous perfusion of the liver. Gallbladder is unremarkable. No intrahepatic or extrahepatic ductal dilatation. Pancreas: Within normal limits. Spleen: Within normal limits. Adrenals/Urinary Tract: 2.5 cm right adrenal nodule (series 5/ image  30) with greater than 40% relative washout, compatible with a benign adrenal adenoma. Left adrenal gland is within normal limits. Two hypoenhancing lesions in the low right lower  kidney measuring 1.5 and 1.1 cm (series 9/ images 39 and 42), possibly reflecting small cysts but technically indeterminate. Left kidney is within normal limits.  No hydronephrosis. Bladder is underdistended but unremarkable. Stomach/Bowel: Stomach is within normal limits. No evidence of bowel obstruction. Normal appendix (series 5/image 72). Vascular/Lymphatic: No evidence of abdominal aortic aneurysm. Small left para-aortic nodes which do not meet pathologic CT size criteria. Reproductive: Prostate is unremarkable. Other: No abdominopelvic ascites. Small fat containing right inguinal hernia. Fat within the left inguinal canal. Musculoskeletal: Degenerative changes of the visualized thoracolumbar spine. Review of the MIP images confirms the above findings. IMPRESSION: No evidence pulmonary embolism. Suspected early/mild interstitial edema, although without pleural effusions. Mild thoracic lymphadenopathy, including partially calcified nodes, possibly sequela of prior granulomatous disease. No evidence of bowel obstruction.  Normal appendix. 2.5 cm benign right adrenal adenoma. Additional ancillary findings as above. Electronically Signed   By: Julian Hy M.D.   On: 03/18/2015 11:23   Dg Abd 2 Views  03/18/2015  CLINICAL DATA:  Intermittent pain and constipation for 2 months EXAM: ABDOMEN - 2 VIEW COMPARISON:  None. FINDINGS: Stool volume is moderate. There is no evidence of bowel obstruction or perforation. No concerning intra-abdominal mass effect or calcification. Thoracic spondylosis with bulky endplate spurring. IMPRESSION: Benign abdomen with moderate stool volume. Electronically Signed   By: Monte Fantasia M.D.   On: 03/18/2015 09:36    EKG:   Orders placed or performed during the hospital encounter of 03/18/15  . ED EKG  . ED EKG  . EKG 12-Lead  . EKG 12-Lead  . EKG 12-Lead  . EKG 12-Lead      Management plans discussed with the patient, family and they are in agreement.  CODE  STATUS:     Code Status Orders        Start     Ordered   03/18/15 1415  Full code   Continuous     03/18/15 1414    Code Status History    Date Active Date Inactive Code Status Order ID Comments User Context   This patient has a current code status but no historical code status.      TOTAL TIME TAKING CARE OF THIS PATIENT: 40 minutes.  Discussed with cardiology  Tatumn Corbridge M.D on 03/19/2015 at 3:02 PM  Between 7am to 6pm - Pager - [phone]  After 6pm go to [email or web address] - password [email or web address]  Office  [phone]  CC: Primary care physician; Volanda Napoleon, MD

## 2015-03-19 NOTE — Progress Notes (Signed)
Spoke with dr. Ether Griffins to make aware patient was not ordered to go home with lasix, per md she will add 40mg  po lasix daily and send rx to pharmacy. Discharged instructions along with home medication list and follow up gone over with patient and wife. chf card given to patient, patient was encouraged to call tina to follow up with heart failure clinic both verbalized that they understood instructions. Patient made aware that all rx were electronically submitted to pharmacy. No distress noted, no c/o pain. Patient to be discharged home on ra. Unable to go HF vest due to bmi greater than 36.

## 2015-03-19 NOTE — Progress Notes (Signed)
Russell Ray is a 45 y.o. male  NX:[id number]  Primary Cardiologist: Neoma Laming Reason for Consultation: CHF  HPI: This is a 45 year old white male with morbid obesity hypertension hyperlipidemia diabetes presented to the emergency room with severe shortness of breath. Patient had appointment with me yesterday but apparently got worse short of breath and cardiac ended up in the hospital. He denied any chest pain but has had PND orthopnea and leg swelling. He is feeling much better now with less shortness of breath after getting Lasix IV.   Review of Systems: No chest pain   Past Medical History  Diagnosis Date  . Diabetes mellitus without complication ([place])   . Hypertension   . Anxiety     Medications Prior to Admission  Medication Sig Dispense Refill  . lisinopril (PRINIVIL,ZESTRIL) 10 MG tablet Take 10 mg by mouth daily.  2  . LORazepam (ATIVAN) 1 MG tablet Take 1 mg by mouth 2 (two) times daily.  2  . metFORMIN (GLUCOPHAGE) 500 MG tablet Take 500 mg by mouth 2 (two) times daily.  0  . omeprazole (PRILOSEC) 40 MG capsule Take 1 capsule by mouth daily.  2     . enoxaparin (LOVENOX) injection  40 mg Subcutaneous Q12H  . furosemide  20 mg Intravenous Q12H  . insulin aspart  0-15 Units Subcutaneous TID WC  . lisinopril  10 mg Oral Daily  . LORazepam  1 mg Oral BID  . metFORMIN  500 mg Oral BID WC  . nicotine  21 mg Transdermal Daily  . pantoprazole  40 mg Oral Daily    Infusions:    No Known Allergies  Social History   Social History  . Marital Status: Married    Spouse Name: N/A  . Number of Children: N/A  . Years of Education: N/A   Occupational History  . Not on file.   Social History Main Topics  . Smoking status: Current Every Day Smoker  . Smokeless tobacco: Not on file  . Alcohol Use: No  . Drug Use: No  . Sexual Activity: Not on file   Other Topics Concern  . Not on file   Social History Narrative  . No narrative on file    Family History   Problem Relation Age of Onset  . Hypertension Mother   . Hypertension Maternal Grandmother   . Diabetes Maternal Grandmother     PHYSICAL EXAM: Filed Vitals:   03/19/15 0418 03/19/15 0755  BP: 111/49 118/56  Pulse: 78   Temp: 97.6 F (36.4 C)   Resp: 19      Intake/Output Summary (Last 24 hours) at 03/19/15 1108 Last data filed at 03/19/15 1000  Gross per 24 hour  Intake    480 ml  Output   5100 ml  Net  -4620 ml    General:  Well appearing. No respiratory difficulty HEENT: normal Neck: supple. no JVD. Carotids 2+ bilat; no bruits. No lymphadenopathy or thryomegaly appreciated. Cor: PMI nondisplaced. Regular rate & rhythm. No rubs, gallops or murmurs. Lungs: clear Abdomen: soft, nontender, nondistended. No hepatosplenomegaly. No bruits or masses. Good bowel sounds. Extremities: no cyanosis, clubbing, rash, edema Neuro: alert & oriented x 3, cranial nerves grossly intact. moves all 4 extremities w/o difficulty. Affect pleasant.  ECG: Normal sinus rhythm with NONSPECIFIC ST and T changes in the inferior leads suggestive of possible ischemia  Results for orders placed or performed during the hospital encounter of 03/18/15 (from the past 24 hour(s))  Glucose,  capillary     Status: None   Collection Time: 03/18/15  1:06 PM  Result Value Ref Range   Glucose-Capillary 85 65 - 99 mg/dL  Hemoglobin A1c     Status: None   Collection Time: 03/18/15  2:20 PM  Result Value Ref Range   Hgb A1c MFr Bld 5.7 4.0 - 6.0 %  CBC     Status: Abnormal   Collection Time: 03/18/15  2:20 PM  Result Value Ref Range   WBC 9.6 3.8 - 10.6 K/uL   RBC 4.63 4.40 - 5.90 MIL/uL   Hemoglobin 12.0 (L) 13.0 - 18.0 g/dL   HCT 37.6 (L) 40.0 - 52.0 %   MCV 81.2 80.0 - 100.0 fL   MCH 26.0 26.0 - 34.0 pg   MCHC 32.0 32.0 - 36.0 g/dL   RDW 17.0 (H) 11.5 - 14.5 %   Platelets 293 150 - 440 K/uL  Creatinine, serum     Status: Abnormal   Collection Time: 03/18/15  2:20 PM  Result Value Ref Range    Creatinine, Ser 0.56 (L) 0.61 - 1.24 mg/dL   GFR calc non Af Amer >60 >60 mL/min   GFR calc Af Amer >60 >60 mL/min  TSH     Status: None   Collection Time: 03/18/15  2:20 PM  Result Value Ref Range   TSH 1.459 0.350 - 4.500 uIU/mL  Troponin I     Status: Abnormal   Collection Time: 03/18/15  2:20 PM  Result Value Ref Range   Troponin I 0.05 (H) <0.031 ng/mL  Glucose, capillary     Status: None   Collection Time: 03/18/15  4:14 PM  Result Value Ref Range   Glucose-Capillary 91 65 - 99 mg/dL  Glucose, capillary     Status: Abnormal   Collection Time: 03/18/15  7:36 PM  Result Value Ref Range   Glucose-Capillary 101 (H) 65 - 99 mg/dL  Troponin I     Status: Abnormal   Collection Time: 03/18/15  8:27 PM  Result Value Ref Range   Troponin I 0.06 (H) <0.031 ng/mL  Troponin I     Status: Abnormal   Collection Time: 03/19/15  2:41 AM  Result Value Ref Range   Troponin I 0.06 (H) <0.031 ng/mL  Urinalysis complete, with microscopic ([facility] only)     Status: Abnormal   Collection Time: 03/19/15  4:40 AM  Result Value Ref Range   Color, Urine STRAW (A) YELLOW   APPearance CLEAR (A) CLEAR   Glucose, UA NEGATIVE NEGATIVE mg/dL   Bilirubin Urine NEGATIVE NEGATIVE   Ketones, ur NEGATIVE NEGATIVE mg/dL   Specific Gravity, Urine 1.008 1.005 - 1.030   Hgb urine dipstick NEGATIVE NEGATIVE   pH 6.0 5.0 - 8.0   Protein, ur NEGATIVE NEGATIVE mg/dL   Nitrite NEGATIVE NEGATIVE   Leukocytes, UA NEGATIVE NEGATIVE   RBC / HPF NONE SEEN 0 - 5 RBC/hpf   WBC, UA NONE SEEN 0 - 5 WBC/hpf   Bacteria, UA NONE SEEN NONE SEEN   Squamous Epithelial / LPF NONE SEEN NONE SEEN  Glucose, capillary     Status: None   Collection Time: 03/19/15  7:28 AM  Result Value Ref Range   Glucose-Capillary 97 65 - 99 mg/dL   Dg Chest 2 View  03/18/2015  CLINICAL DATA:  Progressive shortness of breath. Abdominal distention constipation. Intermittent pain and constipation for 2 weeks. EXAM: CHEST - 2 VIEW COMPARISON:   Two-view chest x-ray a 08/19/2014. FINDINGS: The heart  is mildly enlarged. A diffuse interstitial pattern suggests mild edema. Small effusions are present bilaterally as well. Minimal atelectasis is present at the bases without significant airspace consolidation. Atherosclerotic changes are present at the aortic arch. Emphysematous changes are again noted. Ankylosis of the thoracic spine is present. IMPRESSION: 1. Mild cardiomegaly with new edema and small effusions suggesting early congestive heart failure. 2. Emphysema. 3. No significant airspace consolidation. Electronically Signed   By: San Morelle M.D.   On: 03/18/2015 09:35   Ct Angio Chest Pe W/cm &/or Wo Cm  03/18/2015  CLINICAL DATA:  Intermittent upper abdominal pain, constipation, shortness of breath, cough EXAM: CT ANGIOGRAPHY CHEST CT ABDOMEN AND PELVIS WITH CONTRAST TECHNIQUE: Multidetector CT imaging of the chest was performed using the standard protocol during bolus administration of intravenous contrast. Multiplanar CT image reconstructions and MIPs were obtained to evaluate the vascular anatomy. Multidetector CT imaging of the abdomen and pelvis was performed using the standard protocol during bolus administration of intravenous contrast. CONTRAST:  183mL OMNIPAQUE IOHEXOL 350 MG/ML SOLN COMPARISON:  None. FINDINGS: CTA CHEST FINDINGS No evidence of pulmonary embolism. Mediastinum/Nodes: Cardiomegaly.  No pericardial effusion. Atherosclerotic calcifications of the aortic arch. Thoracic lymphadenopathy, including: --11 mm short axis node at the left thoracic inlet (series 6/image 18) --15 mm short axis high right paratracheal node (series 6/ image 36) --16 mm short axis AP window node, partially calcified (series 6/ image 46) --9 mm short axis low right paratracheal node (series 6/ image 48) --22 mm short axis right azygoesophageal recess node (series 6/image 72) Visualized thyroid is unremarkable. Lungs/Pleura: Wedge-shaped ground-glass  opacity at the right lung apex (series 10/ image 22), favored to reflect right apical pleural parenchymal scarring. Interlobular septal thickening with ground-glass opacity/mosaic attenuation, favored to reflect mild interstitial edema. Mild subpleural reticulation. Mild patchy opacities at the lung bases, likely atelectasis. No pleural effusions. No pneumothorax. Musculoskeletal: Degenerative changes of the thoracic spine. Exaggerated lower thoracic kyphosis. CT ABDOMEN PELVIS FINDINGS Hepatobiliary: Heterogeneous perfusion of the liver. Gallbladder is unremarkable. No intrahepatic or extrahepatic ductal dilatation. Pancreas: Within normal limits. Spleen: Within normal limits. Adrenals/Urinary Tract: 2.5 cm right adrenal nodule (series 5/ image 30) with greater than 40% relative washout, compatible with a benign adrenal adenoma. Left adrenal gland is within normal limits. Two hypoenhancing lesions in the low right lower kidney measuring 1.5 and 1.1 cm (series 9/ images 39 and 42), possibly reflecting small cysts but technically indeterminate. Left kidney is within normal limits.  No hydronephrosis. Bladder is underdistended but unremarkable. Stomach/Bowel: Stomach is within normal limits. No evidence of bowel obstruction. Normal appendix (series 5/image 72). Vascular/Lymphatic: No evidence of abdominal aortic aneurysm. Small left para-aortic nodes which do not meet pathologic CT size criteria. Reproductive: Prostate is unremarkable. Other: No abdominopelvic ascites. Small fat containing right inguinal hernia. Fat within the left inguinal canal. Musculoskeletal: Degenerative changes of the visualized thoracolumbar spine. Review of the MIP images confirms the above findings. IMPRESSION: No evidence pulmonary embolism. Suspected early/mild interstitial edema, although without pleural effusions. Mild thoracic lymphadenopathy, including partially calcified nodes, possibly sequela of prior granulomatous disease. No  evidence of bowel obstruction.  Normal appendix. 2.5 cm benign right adrenal adenoma. Additional ancillary findings as above. Electronically Signed   By: Julian Hy M.D.   On: 03/18/2015 11:23   Ct Abdomen Pelvis W Contrast  03/18/2015  CLINICAL DATA:  Intermittent upper abdominal pain, constipation, shortness of breath, cough EXAM: CT ANGIOGRAPHY CHEST CT ABDOMEN AND PELVIS WITH CONTRAST TECHNIQUE: Multidetector CT imaging  of the chest was performed using the standard protocol during bolus administration of intravenous contrast. Multiplanar CT image reconstructions and MIPs were obtained to evaluate the vascular anatomy. Multidetector CT imaging of the abdomen and pelvis was performed using the standard protocol during bolus administration of intravenous contrast. CONTRAST:  17mL OMNIPAQUE IOHEXOL 350 MG/ML SOLN COMPARISON:  None. FINDINGS: CTA CHEST FINDINGS No evidence of pulmonary embolism. Mediastinum/Nodes: Cardiomegaly.  No pericardial effusion. Atherosclerotic calcifications of the aortic arch. Thoracic lymphadenopathy, including: --11 mm short axis node at the left thoracic inlet (series 6/image 18) --15 mm short axis high right paratracheal node (series 6/ image 36) --16 mm short axis AP window node, partially calcified (series 6/ image 46) --9 mm short axis low right paratracheal node (series 6/ image 48) --22 mm short axis right azygoesophageal recess node (series 6/image 72) Visualized thyroid is unremarkable. Lungs/Pleura: Wedge-shaped ground-glass opacity at the right lung apex (series 10/ image 22), favored to reflect right apical pleural parenchymal scarring. Interlobular septal thickening with ground-glass opacity/mosaic attenuation, favored to reflect mild interstitial edema. Mild subpleural reticulation. Mild patchy opacities at the lung bases, likely atelectasis. No pleural effusions. No pneumothorax. Musculoskeletal: Degenerative changes of the thoracic spine. Exaggerated lower  thoracic kyphosis. CT ABDOMEN PELVIS FINDINGS Hepatobiliary: Heterogeneous perfusion of the liver. Gallbladder is unremarkable. No intrahepatic or extrahepatic ductal dilatation. Pancreas: Within normal limits. Spleen: Within normal limits. Adrenals/Urinary Tract: 2.5 cm right adrenal nodule (series 5/ image 30) with greater than 40% relative washout, compatible with a benign adrenal adenoma. Left adrenal gland is within normal limits. Two hypoenhancing lesions in the low right lower kidney measuring 1.5 and 1.1 cm (series 9/ images 39 and 42), possibly reflecting small cysts but technically indeterminate. Left kidney is within normal limits.  No hydronephrosis. Bladder is underdistended but unremarkable. Stomach/Bowel: Stomach is within normal limits. No evidence of bowel obstruction. Normal appendix (series 5/image 72). Vascular/Lymphatic: No evidence of abdominal aortic aneurysm. Small left para-aortic nodes which do not meet pathologic CT size criteria. Reproductive: Prostate is unremarkable. Other: No abdominopelvic ascites. Small fat containing right inguinal hernia. Fat within the left inguinal canal. Musculoskeletal: Degenerative changes of the visualized thoracolumbar spine. Review of the MIP images confirms the above findings. IMPRESSION: No evidence pulmonary embolism. Suspected early/mild interstitial edema, although without pleural effusions. Mild thoracic lymphadenopathy, including partially calcified nodes, possibly sequela of prior granulomatous disease. No evidence of bowel obstruction.  Normal appendix. 2.5 cm benign right adrenal adenoma. Additional ancillary findings as above. Electronically Signed   By: Julian Hy M.D.   On: 03/18/2015 11:23   Dg Abd 2 Views  03/18/2015  CLINICAL DATA:  Intermittent pain and constipation for 2 months EXAM: ABDOMEN - 2 VIEW COMPARISON:  None. FINDINGS: Stool volume is moderate. There is no evidence of bowel obstruction or perforation. No concerning  intra-abdominal mass effect or calcification. Thoracic spondylosis with bulky endplate spurring. IMPRESSION: Benign abdomen with moderate stool volume. Electronically Signed   By: Monte Fantasia M.D.   On: 03/18/2015 09:36     ASSESSMENT AND PLAN: CHF with mildly elevated troponin 0.05 0.06 and nonspecific ST-T changes on EKG. Presented with CHF which has resolved with IV Lasix. Patient has mildly elevated troponin most likely due to CHF and denies any chest pain. Patient can go home on Lasix and aspirin with follow-up in the office Friday at 3:00 where [facility] will be done in the office.  KHAN,SHAUKAT A

## 2015-04-22 ENCOUNTER — Encounter: Payer: Self-pay | Admitting: Emergency Medicine

## 2015-04-22 ENCOUNTER — Encounter
Admission: EM | Disposition: A | Discharge status: Other Acute Inpatient Hospital | Payer: Self-pay | Source: Home / Self Care | Attending: Emergency Medicine

## 2015-04-22 ENCOUNTER — Emergency Department: Payer: 59

## 2015-04-22 ENCOUNTER — Observation Stay
Admission: EM | Admit: 2015-04-22 | Discharge: 2015-04-22 | Payer: 59 | Attending: Internal Medicine | Admitting: Internal Medicine

## 2015-04-22 ENCOUNTER — Ambulatory Visit (HOSPITAL_COMMUNITY)
Admission: AD | Admit: 2015-04-22 | Discharge: 2015-04-22 | Disposition: A | Discharge status: Home / Self Care | Payer: 59 | Source: Other Acute Inpatient Hospital | Attending: Internal Medicine | Admitting: Internal Medicine

## 2015-04-22 DIAGNOSIS — R0600 Dyspnea, unspecified: Secondary | ICD-10-CM | POA: Diagnosis present

## 2015-04-22 DIAGNOSIS — I5031 Acute diastolic (congestive) heart failure: Secondary | ICD-10-CM | POA: Insufficient documentation

## 2015-04-22 DIAGNOSIS — I110 Hypertensive heart disease with heart failure: Secondary | ICD-10-CM | POA: Diagnosis not present

## 2015-04-22 DIAGNOSIS — Z7982 Long term (current) use of aspirin: Secondary | ICD-10-CM | POA: Diagnosis not present

## 2015-04-22 DIAGNOSIS — J810 Acute pulmonary edema: Secondary | ICD-10-CM | POA: Insufficient documentation

## 2015-04-22 DIAGNOSIS — I272 Other secondary pulmonary hypertension: Secondary | ICD-10-CM | POA: Insufficient documentation

## 2015-04-22 DIAGNOSIS — R0602 Shortness of breath: Secondary | ICD-10-CM | POA: Insufficient documentation

## 2015-04-22 DIAGNOSIS — R778 Other specified abnormalities of plasma proteins: Secondary | ICD-10-CM | POA: Diagnosis not present

## 2015-04-22 DIAGNOSIS — Z6841 Body Mass Index (BMI) 40.0 and over, adult: Secondary | ICD-10-CM | POA: Insufficient documentation

## 2015-04-22 DIAGNOSIS — I350 Nonrheumatic aortic (valve) stenosis: Secondary | ICD-10-CM | POA: Insufficient documentation

## 2015-04-22 DIAGNOSIS — Z87891 Personal history of nicotine dependence: Secondary | ICD-10-CM | POA: Insufficient documentation

## 2015-04-22 DIAGNOSIS — Z79899 Other long term (current) drug therapy: Secondary | ICD-10-CM | POA: Diagnosis not present

## 2015-04-22 DIAGNOSIS — R079 Chest pain, unspecified: Secondary | ICD-10-CM | POA: Diagnosis present

## 2015-04-22 DIAGNOSIS — Z8249 Family history of ischemic heart disease and other diseases of the circulatory system: Secondary | ICD-10-CM | POA: Diagnosis not present

## 2015-04-22 DIAGNOSIS — M7989 Other specified soft tissue disorders: Secondary | ICD-10-CM | POA: Insufficient documentation

## 2015-04-22 DIAGNOSIS — Z833 Family history of diabetes mellitus: Secondary | ICD-10-CM | POA: Insufficient documentation

## 2015-04-22 DIAGNOSIS — Z794 Long term (current) use of insulin: Secondary | ICD-10-CM | POA: Insufficient documentation

## 2015-04-22 DIAGNOSIS — E119 Type 2 diabetes mellitus without complications: Secondary | ICD-10-CM | POA: Insufficient documentation

## 2015-04-22 DIAGNOSIS — F419 Anxiety disorder, unspecified: Secondary | ICD-10-CM | POA: Diagnosis not present

## 2015-04-22 HISTORY — PX: CARDIAC CATHETERIZATION: SHX172

## 2015-04-22 LAB — BASIC METABOLIC PANEL
ANION GAP: 11 (ref 5–15)
BUN: 18 mg/dL (ref 6–20)
CALCIUM: 9.2 mg/dL (ref 8.9–10.3)
CHLORIDE: 102 mmol/L (ref 101–111)
CO2: 23 mmol/L (ref 22–32)
Creatinine, Ser: 0.81 mg/dL (ref 0.61–1.24)
GFR calc non Af Amer: >60 mL/min (ref >60)
GLUCOSE: 125 mg/dL — AB (ref 65–99)
POTASSIUM: 4.2 mmol/L (ref 3.5–5.1)
Sodium: 136 mmol/L (ref 135–145)

## 2015-04-22 LAB — HEMOGLOBIN A1C: Hgb A1c MFr Bld: 5.8 % (ref 4.0–6.0)

## 2015-04-22 LAB — GLUCOSE, CAPILLARY
GLUCOSE-CAPILLARY: 103 mg/dL — AB (ref 65–99)
Glucose-Capillary: 122 mg/dL — ABNORMAL HIGH (ref 65–99)

## 2015-04-22 LAB — CBC WITH DIFFERENTIAL/PLATELET
BASOS ABS: 0.1 10*3/uL (ref 0–0.1)
EOS ABS: 0.2 10*3/uL (ref 0–0.7)
Eosinophils Relative: 2 %
HCT: 39.5 % — ABNORMAL LOW (ref 40.0–52.0)
HEMOGLOBIN: 13.2 g/dL (ref 13.0–18.0)
LYMPHS ABS: 2 10*3/uL (ref 1.0–3.6)
MCH: 26.6 pg (ref 26.0–34.0)
MCHC: 33.5 g/dL (ref 32.0–36.0)
MCV: 79.2 fL — ABNORMAL LOW (ref 80.0–100.0)
Monocytes Absolute: 0.6 10*3/uL (ref 0.2–1.0)
NEUTROS ABS: 7.3 10*3/uL — AB (ref 1.4–6.5)
Platelets: 262 10*3/uL (ref 150–440)
RBC: 4.99 MIL/uL (ref 4.40–5.90)
RDW: 16.9 % — ABNORMAL HIGH (ref 11.5–14.5)
WBC: 10.3 10*3/uL (ref 3.8–10.6)

## 2015-04-22 LAB — TROPONIN I
Troponin I: 0.1 ng/mL — ABNORMAL HIGH (ref <0.031)
Troponin I: 0.1 ng/mL — ABNORMAL HIGH (ref <0.031)

## 2015-04-22 LAB — BRAIN NATRIURETIC PEPTIDE: B Natriuretic Peptide: 248 pg/mL — ABNORMAL HIGH (ref 0.0–100.0)

## 2015-04-22 SURGERY — RIGHT/LEFT HEART CATH AND CORONARY ANGIOGRAPHY
Anesthesia: Moderate Sedation | Laterality: Right

## 2015-04-22 MED ORDER — SODIUM CHLORIDE 0.9% FLUSH: 3.0000 mL | Freq: Two times a day (BID) | INTRAVENOUS | Status: DC

## 2015-04-22 MED ORDER — SODIUM CHLORIDE 0.9% FLUSH
3.0000 mL | Freq: Two times a day (BID) | INTRAVENOUS | Status: DC
  Administered 2015-04-22: 3 mL via INTRAVENOUS

## 2015-04-22 MED ORDER — MORPHINE SULFATE (PF) 4 MG/ML IV SOLN
4.0000 mg | Freq: Once | INTRAVENOUS | Status: AC
  Administered 2015-04-22: 4 mg via INTRAVENOUS

## 2015-04-22 MED ORDER — SODIUM CHLORIDE 0.9 % IV SOLN: 250.0000 mL | INTRAVENOUS | Status: DC | PRN

## 2015-04-22 MED ORDER — MORPHINE SULFATE (PF) 4 MG/ML IV SOLN
4.0000 mg | Freq: Once | INTRAVENOUS | Status: AC
  Administered 2015-04-22: 4 mg via INTRAVENOUS
  Filled 2015-04-22: qty 1

## 2015-04-22 MED ORDER — ASPIRIN EC 81 MG PO TBEC
81.0000 mg | DELAYED_RELEASE_TABLET | Freq: Every day | ORAL | Status: DC
  Administered 2015-04-22: 81 mg via ORAL
  Filled 2015-04-22: qty 1

## 2015-04-22 MED ORDER — HEPARIN SODIUM (PORCINE) 5000 UNIT/ML IJ SOLN
5000.0000 [IU] | Freq: Three times a day (TID) | INTRAMUSCULAR | Status: DC
  Filled 2015-04-22: qty 1

## 2015-04-22 MED ORDER — SENNOSIDES-DOCUSATE SODIUM 8.6-50 MG PO TABS: 1.0000 | ORAL_TABLET | Freq: Every evening | ORAL | Status: DC | PRN

## 2015-04-22 MED ORDER — ACETAMINOPHEN 650 MG RE SUPP: 650.0000 mg | Freq: Four times a day (QID) | RECTAL | Status: DC | PRN

## 2015-04-22 MED ORDER — SODIUM CHLORIDE 0.9 % IV SOLN
INTRAVENOUS | Status: AC
Start: 2015-04-22 — End: 2015-04-22
  Administered 2015-04-22: 11:00:00 via INTRAVENOUS

## 2015-04-22 MED ORDER — INSULIN ASPART 100 UNIT/ML ~~LOC~~ SOLN: 0.0000 [IU] | Freq: Three times a day (TID) | SUBCUTANEOUS | Status: DC

## 2015-04-22 MED ORDER — INSULIN ASPART 100 UNIT/ML ~~LOC~~ SOLN: 0.0000 [IU] | Freq: Every day | SUBCUTANEOUS | Status: DC

## 2015-04-22 MED ORDER — HEPARIN (PORCINE) IN NACL 2-0.9 UNIT/ML-% IJ SOLN
INTRAMUSCULAR | Status: AC
  Filled 2015-04-22: qty 1000

## 2015-04-22 MED ORDER — ONDANSETRON HCL 4 MG/2ML IJ SOLN: 4.0000 mg | Freq: Four times a day (QID) | INTRAMUSCULAR | Status: DC | PRN

## 2015-04-22 MED ORDER — MIDAZOLAM HCL 2 MG/2ML IJ SOLN
INTRAMUSCULAR | Status: AC
  Filled 2015-04-22: qty 2

## 2015-04-22 MED ORDER — FENTANYL CITRATE (PF) 100 MCG/2ML IJ SOLN
INTRAMUSCULAR | Status: AC
  Filled 2015-04-22: qty 2

## 2015-04-22 MED ORDER — FUROSEMIDE 10 MG/ML IJ SOLN: 20.0000 mg | Freq: Once | INTRAMUSCULAR | Status: DC

## 2015-04-22 MED ORDER — LISINOPRIL 10 MG PO TABS
10.0000 mg | ORAL_TABLET | Freq: Every day | ORAL | Status: DC
  Administered 2015-04-22: 10 mg via ORAL
  Filled 2015-04-22: qty 1

## 2015-04-22 MED ORDER — ACETAMINOPHEN 325 MG PO TABS: 650.0000 mg | ORAL_TABLET | Freq: Four times a day (QID) | ORAL | Status: DC | PRN

## 2015-04-22 MED ORDER — ACETAMINOPHEN 325 MG PO TABS: 650.0000 mg | ORAL_TABLET | ORAL | Status: DC | PRN

## 2015-04-22 MED ORDER — OXYCODONE HCL 5 MG PO TABS
5.0000 mg | ORAL_TABLET | ORAL | Status: DC | PRN
  Administered 2015-04-22 (×2): 5 mg via ORAL
  Filled 2015-04-22 (×2): qty 1

## 2015-04-22 MED ORDER — CYCLOBENZAPRINE HCL 10 MG PO TABS: 5.0000 mg | ORAL_TABLET | Freq: Three times a day (TID) | ORAL | Status: DC | PRN

## 2015-04-22 MED ORDER — SODIUM CHLORIDE 0.9% FLUSH: 3.0000 mL | INTRAVENOUS | Status: DC | PRN

## 2015-04-22 MED ORDER — NAPROXEN 500 MG PO TABS: 500.0000 mg | ORAL_TABLET | Freq: Two times a day (BID) | ORAL | Status: DC | PRN

## 2015-04-22 MED ORDER — NICOTINE 14 MG/24HR TD PT24
14.0000 mg | MEDICATED_PATCH | Freq: Every day | TRANSDERMAL | Status: DC
  Administered 2015-04-22: 14 mg via TRANSDERMAL
  Filled 2015-04-22: qty 1

## 2015-04-22 MED ORDER — ASPIRIN 81 MG PO CHEW: 81.0000 mg | CHEWABLE_TABLET | ORAL | Status: DC

## 2015-04-22 MED ORDER — MORPHINE SULFATE (PF) 4 MG/ML IV SOLN
INTRAVENOUS | Status: DC
Start: 2015-04-22 — End: 2015-04-22
  Filled 2015-04-22: qty 1

## 2015-04-22 MED ORDER — MORPHINE SULFATE (PF) 2 MG/ML IV SOLN
2.0000 mg | INTRAVENOUS | Status: DC | PRN
  Administered 2015-04-22: 2 mg via INTRAVENOUS
  Filled 2015-04-22: qty 1

## 2015-04-22 MED ORDER — ONDANSETRON HCL 4 MG/2ML IJ SOLN
4.0000 mg | Freq: Once | INTRAMUSCULAR | Status: AC
  Administered 2015-04-22: 4 mg via INTRAVENOUS
  Filled 2015-04-22: qty 2

## 2015-04-22 MED ORDER — FUROSEMIDE 40 MG PO TABS
40.0000 mg | ORAL_TABLET | Freq: Every day | ORAL | Status: DC
  Administered 2015-04-22: 40 mg via ORAL
  Filled 2015-04-22: qty 1

## 2015-04-22 MED ORDER — SODIUM CHLORIDE 0.9 % WEIGHT BASED INFUSION
1.0000 mL/kg/h | INTRAVENOUS | Status: DC
  Administered 2015-04-22 (×2): 1 mL/kg/h via INTRAVENOUS

## 2015-04-22 MED ORDER — SODIUM CHLORIDE 0.9 % WEIGHT BASED INFUSION
3.0000 mL/kg/h | INTRAVENOUS | Status: DC
Start: 2015-04-23 — End: 2015-04-22
  Administered 2015-04-22: 3 mL/kg/h via INTRAVENOUS

## 2015-04-22 MED ORDER — IOHEXOL 300 MG/ML  SOLN
INTRAMUSCULAR | Status: DC | PRN
  Administered 2015-04-22: 125 mL via INTRA_ARTERIAL

## 2015-04-22 MED ORDER — SODIUM CHLORIDE 0.9 % IV BOLUS (SEPSIS)
500.0000 mL | Freq: Once | INTRAVENOUS | Status: AC
  Administered 2015-04-22: 500 mL via INTRAVENOUS

## 2015-04-22 MED ORDER — MIDAZOLAM HCL 2 MG/2ML IJ SOLN
INTRAMUSCULAR | Status: DC | PRN
  Administered 2015-04-22: 1 mg via INTRAVENOUS

## 2015-04-22 MED ORDER — NITROGLYCERIN 0.4 MG SL SUBL: 0.4000 mg | SUBLINGUAL_TABLET | SUBLINGUAL | Status: DC | PRN

## 2015-04-22 MED ORDER — LORAZEPAM 1 MG PO TABS
1.0000 mg | ORAL_TABLET | Freq: Two times a day (BID) | ORAL | Status: DC
  Administered 2015-04-22: 1 mg via ORAL
  Filled 2015-04-22: qty 1

## 2015-04-22 MED ORDER — ONDANSETRON HCL 4 MG PO TABS: 4.0000 mg | ORAL_TABLET | Freq: Four times a day (QID) | ORAL | Status: DC | PRN

## 2015-04-22 MED ORDER — SODIUM CHLORIDE 0.9 % WEIGHT BASED INFUSION: 1.0000 mL/kg/h | INTRAVENOUS | Status: DC

## 2015-04-22 SURGICAL SUPPLY — 17 items
CATH INFINITI 5 FR 3DRC (CATHETERS) ×3 IMPLANT
CATH INFINITI 5FR ANG PIGTAIL (CATHETERS) IMPLANT
CATH INFINITI 5FR JL4 (CATHETERS) ×3 IMPLANT
CATH INFINITI 5FR JL5 (CATHETERS) ×3 IMPLANT
CATH INFINITI JR4 5F (CATHETERS) ×3 IMPLANT
CATH SWANZ 7F THERMO (CATHETERS) ×3 IMPLANT
DEVICE CLOSURE MYNXGRIP 5F (Vascular Products) ×3 IMPLANT
DEVICE CLOSURE MYNXGRIP 6/7F (Vascular Products) ×3 IMPLANT
KIT MANI 3VAL PERCEP (MISCELLANEOUS) ×3 IMPLANT
KIT RIGHT HEART (MISCELLANEOUS) ×3 IMPLANT
NEEDLE PERC 18GX7CM (NEEDLE) ×3 IMPLANT
PACK CARDIAC CATH (CUSTOM PROCEDURE TRAY) ×3 IMPLANT
SHEATH AVANTI 5FR X 11CM (SHEATH) ×3 IMPLANT
SHEATH AVANTI 6FR X 11CM (SHEATH) IMPLANT
SHEATH PINNACLE 7F 10CM (SHEATH) ×3 IMPLANT
WIRE EMERALD 3MM-J .035X150CM (WIRE) ×3 IMPLANT
WIRE EMERALD ST .035X150CM (WIRE) ×3 IMPLANT

## 2015-04-22 NOTE — Progress Notes (Signed)
Report given to [facility] will pick up "in a couple hours". Pt educated on tx. Insurance was checked by [employer]. Pt has no further concerns at this time. MD hower discussed with pt via phone about Bp issues.

## 2015-04-22 NOTE — Progress Notes (Signed)
Patient's BP is running low 72/28.  Paged Hower.  Orders to give a liter of fluid and reassess.  Cath pending.

## 2015-04-22 NOTE — Discharge Summary (Signed)
[place] [facility] [place] NAME: Russell Ray    MR#:  [id number]  DATE OF BIRTH:  [birth year]  DATE OF ADMISSION:  04/22/2015 ADMITTING PHYSICIAN: Lytle Butte, MD  DATE OF DISCHARGE: No discharge date for patient encounter.  PRIMARY CARE PHYSICIAN: Volanda Napoleon, MD    ADMISSION DIAGNOSIS:  Acute pulmonary edema (HCC) [J81.0] Dyspnea [R06.00] Chest pain, unspecified chest pain type [R07.9]  DISCHARGE DIAGNOSIS:  Active Problems:   Chest pain, central severe aortic stenosis  SECONDARY DIAGNOSIS:   Past Medical History  Diagnosis Date  . Diabetes mellitus without complication ([place])   . Hypertension   . Anxiety     HOSPITAL COURSE:  Russell Ray  is a 45 y.o. male admitted 04/22/2015 with chief complaint of chest pain at rest. Noted mildly elevated troponin without upward trend. Cardiology consulted and performed right/left heart cath.  Found severe aortic stenosis: AV mean gradient 53.0 mmHg; peak gradient 74.0 mmHg; AVA 0.36 cm2; EF 65%requiring transfer to [facility] for TAVR at the recommendation of cardiology.  DISCHARGE CONDITIONS:   Stable/require transfer  CONSULTS OBTAINED:  Treatment Team:  Lytle Butte, MD Dionisio David, MD  DRUG ALLERGIES:  No Known Allergies  DISCHARGE MEDICATIONS:   Current Discharge Medication List    CONTINUE these medications which have NOT CHANGED   Details  aspirin EC 81 MG tablet Take 1 tablet (81 mg total) by mouth daily. Qty: 30 tablet, Refills: 0    cyclobenzaprine (FLEXERIL) 5 MG tablet Take 5 mg by mouth 3 (three) times daily as needed.    furosemide (LASIX) 40 MG tablet Take 1 tablet (40 mg total) by mouth daily. Qty: 30 tablet, Refills: 6    lisinopril (PRINIVIL,ZESTRIL) 10 MG tablet Take 10 mg by mouth daily. Refills: 2    LORazepam (ATIVAN) 1 MG tablet Take 1 mg by mouth 2 (two) times daily. Refills: 2    metFORMIN (GLUCOPHAGE) 500 MG tablet Take 500 mg by mouth  2 (two) times daily. Refills: 0    nicotine (NICODERM CQ - DOSED IN MG/24 HOURS) 14 mg/24hr patch Place 14 mg onto the skin daily.    senna-docusate (SENOKOT-S) 8.6-50 MG tablet Take 1 tablet by mouth at bedtime as needed for mild constipation. Qty: 30 tablet, Refills: 6         DISCHARGE INSTRUCTIONS:    DIET:  Cardiac diet  DISCHARGE CONDITION:  Fair  ACTIVITY:  Activity as tolerated  OXYGEN:  Home Oxygen: No.   Oxygen Delivery: room air  DISCHARGE LOCATION:  [facility]   If you experience worsening of your admission symptoms, develop shortness of breath, life threatening emergency, suicidal or homicidal thoughts you must seek medical attention immediately by calling 911 or calling your MD immediately  if symptoms less severe.  You Must read complete instructions/literature along with all the possible adverse reactions/side effects for all the Medicines you take and that have been prescribed to you. Take any new Medicines after you have completely understood and accpet all the possible adverse reactions/side effects.   Please note  You were cared for by a hospitalist during your hospital stay. If you have any questions about your discharge medications or the care you received while you were in the hospital after you are discharged, you can call the unit and asked to speak with the hospitalist on call if the hospitalist that took care of you is not available. Once you are discharged, your primary care physician  will handle any further medical issues. Please note that NO REFILLS for any discharge medications will be authorized once you are discharged, as it is imperative that you return to your primary care physician (or establish a relationship with a primary care physician if you do not have one) for your aftercare needs so that they can reassess your need for medications and monitor your lab values.    On the day of Discharge:   VITAL SIGNS:  Blood pressure 79/28, pulse 83,  temperature 98.3 F (36.8 C), temperature source Oral, resp. rate 20, height 6' (1.829 m), weight 177 kg (390 lb 3.4 oz), SpO2 92 %.  I/O:   Intake/Output Summary (Last 24 hours) at 04/22/15 1615 Last data filed at 04/22/15 1500  Gross per 24 hour  Intake      0 ml  Output    800 ml  Net   -800 ml    PHYSICAL EXAMINATION:  GENERAL:  45 y.o.-year-old patient lying in the bed with no acute distress.  EYES: Pupils equal, round, reactive to light and accommodation. No scleral icterus. Extraocular muscles intact.  HEENT: Head atraumatic, normocephalic. Oropharynx and nasopharynx clear.  NECK:  Supple, no jugular venous distention. No thyroid enlargement, no tenderness.  LUNGS: Normal breath sounds bilaterally, no wheezing, rales,rhonchi or crepitation. No use of accessory muscles of respiration.  CARDIOVASCULAR: S1, S2 normal. No murmurs, rubs, or gallops.  ABDOMEN: Soft, non-tender, non-distended. Bowel sounds present. No organomegaly or mass.  EXTREMITIES: No pedal edema, cyanosis, or clubbing.  NEUROLOGIC: Cranial nerves II through XII are intact. Muscle strength 5/5 in all extremities. Sensation intact. Gait not checked.  PSYCHIATRIC: The patient is alert and oriented x 3.  SKIN: No obvious rash, lesion, or ulcer.   DATA REVIEW:   CBC  Recent Labs Lab 04/22/15 0351  WBC 10.3  HGB 13.2  HCT 39.5*  PLT 262    Chemistries   Recent Labs Lab 04/22/15 0351  NA 136  K 4.2  CL 102  CO2 23  GLUCOSE 125*  BUN 18  CREATININE 0.81  CALCIUM 9.2    Cardiac Enzymes  Recent Labs Lab 04/22/15 0919  TROPONINI 0.10*    Microbiology Results  No results found for this or any previous visit.  RADIOLOGY:  Dg Chest 2 View  04/22/2015  CLINICAL DATA:  Acute onset of shortness of breath and left-sided chest tightness. Initial encounter. EXAM: CHEST  2 VIEW COMPARISON:  Chest radiograph and CTA of the chest performed 03/18/2015 FINDINGS: The lungs are well-aerated. Vascular  congestion is noted. Increased interstitial markings raise concern for pulmonary edema. There is no evidence of pleural effusion or pneumothorax. The heart is normal in size; the mediastinal contour is within normal limits. No acute osseous abnormalities are seen. IMPRESSION: Vascular congestion noted. Increased interstitial markings raise concern for pulmonary edema. Electronically Signed   By: Garald Balding M.D.   On: 04/22/2015 04:48     Management plans discussed with the patient, family and they are in agreement.  CODE STATUS:     Code Status Orders        Start     Ordered   04/22/15 0737  Full code   Continuous     04/22/15 0737    Code Status History    Date Active Date Inactive Code Status Order ID Comments User Context   03/18/2015  2:14 PM 03/19/2015  3:15 PM Full Code [id number]  Fritzi Mandes, MD Inpatient      TOTAL  TIME TAKING CARE OF THIS PATIENT: 28 minutes.    Hower,  Karenann Cai.D on 04/22/2015 at 4:15 PM  Between 7am to 6pm - Pager - [phone]  After 6pm go to [email or web address] - password [email or web address] [facility]  Office  [phone]  CC: Primary care physician; Volanda Napoleon, MD

## 2015-04-22 NOTE — H&P (Signed)
[facility] [place] NAME: Russell Ray    MR#:  [id number]  DATE OF BIRTH:  [birth year]   DATE OF ADMISSION:  04/22/2015  PRIMARY CARE PHYSICIAN: Volanda Napoleon, MD   REQUESTING/REFERRING PHYSICIAN: [place]  CHIEF COMPLAINT:   Chief Complaint  Patient presents with  . Chest Pain    HISTORY OF PRESENT ILLNESS:  Russell Ray  is a 45 y.o. male with a known history of aortic stenosis, type 2 diabetes non-insulin-requiring presenting with chest pain shortness of breath He describes acute onset of chest pain described chest tightness retrosternal intensity 8/10 nonradiating no worsening or relieving factors associated shortness of breath. These occurred at rest while sitting his chair. Symptoms eventually resolved by themselves present to the Hospital further workup and evaluation. Of note recently evaluated for similar symptoms found to have aortic stenosis as a follow-up with his cardiologist planning continued workup Chest pain-free at this time  PAST MEDICAL HISTORY:   Past Medical History  Diagnosis Date  . Diabetes mellitus without complication ([place])   . Hypertension   . Anxiety     PAST SURGICAL HISTORY:   Past Surgical History  Procedure Laterality Date  . Hernia repair      X3    SOCIAL HISTORY:   Social History  Substance Use Topics  . Smoking status: Former Smoker    Types: Cigarettes  . Smokeless tobacco: Not on file  . Alcohol Use: No    FAMILY HISTORY:   Family History  Problem Relation Age of Onset  . Hypertension Mother   . Hypertension Maternal Grandmother   . Diabetes Maternal Grandmother     DRUG ALLERGIES:  No Known Allergies  REVIEW OF SYSTEMS:  REVIEW OF SYSTEMS:  CONSTITUTIONAL: Denies fevers, chills, fatigue, weakness.  EYES: Denies blurred vision, double vision, or eye pain.  EARS, NOSE, THROAT: Denies tinnitus, ear pain, hearing loss.  RESPIRATORY: denies cough,  positive shortness of breath, denies wheezing  CARDIOVASCULAR: Positive chest pain, denies palpitations, edema.  GASTROINTESTINAL: Denies nausea, vomiting, diarrhea, abdominal pain.  GENITOURINARY: Denies dysuria, hematuria.  ENDOCRINE: Denies nocturia or thyroid problems. HEMATOLOGIC AND LYMPHATIC: Denies easy bruising or bleeding.  SKIN: Denies rash or lesions.  MUSCULOSKELETAL: Denies pain in neck, back, shoulder, knees, hips, or further arthritic symptoms.  NEUROLOGIC: Denies paralysis, paresthesias.  PSYCHIATRIC: Denies anxiety or depressive symptoms. Otherwise full review of systems performed by me is negative.   MEDICATIONS AT HOME:   Prior to Admission medications   Medication Sig Start Date End Date Taking? Authorizing Provider  aspirin EC 81 MG tablet Take 1 tablet (81 mg total) by mouth daily. 03/19/15  Yes Theodoro Grist, MD  cyclobenzaprine (FLEXERIL) 5 MG tablet Take 5 mg by mouth 3 (three) times daily as needed.   Yes Historical Provider, MD  furosemide (LASIX) 40 MG tablet Take 1 tablet (40 mg total) by mouth daily. 03/19/15  Yes Theodoro Grist, MD  lisinopril (PRINIVIL,ZESTRIL) 10 MG tablet Take 10 mg by mouth daily. 03/02/15  Yes Historical Provider, MD  LORazepam (ATIVAN) 1 MG tablet Take 1 mg by mouth 2 (two) times daily. 03/11/15  Yes Historical Provider, MD  metFORMIN (GLUCOPHAGE) 500 MG tablet Take 500 mg by mouth 2 (two) times daily. 03/08/15  Yes Historical Provider, MD  naproxen sodium (ANAPROX) 550 MG tablet Take 550 mg by mouth 2 (two) times daily as needed for mild pain.    Yes Historical Provider, MD  nicotine (NICODERM CQ -  DOSED IN MG/24 HOURS) 14 mg/24hr patch Place 14 mg onto the skin daily.   Yes Historical Provider, MD  senna-docusate (SENOKOT-S) 8.6-50 MG tablet Take 1 tablet by mouth at bedtime as needed for mild constipation. 03/19/15  Yes Theodoro Grist, MD      VITAL SIGNS:  Blood pressure 97/60, pulse 84, temperature 98 F (36.7 C), temperature source  Oral, resp. rate 16, height 6' (1.829 m), weight 176.903 kg (390 lb), SpO2 92 %.  PHYSICAL EXAMINATION:  VITAL SIGNS: Filed Vitals:   04/22/15 0700 04/22/15 0730  BP: 103/43 97/60  Pulse: 83 84  Temp:    Resp: 21 16   GENERAL:44 y.o.male currently in no acute distress.  HEAD: Normocephalic, atraumatic.  EYES: Pupils equal, round, reactive to light. Extraocular muscles intact. No scleral icterus.  MOUTH: Moist mucosal membrane. Dentition intact. No abscess noted.  EAR, NOSE, THROAT: Clear without exudates. No external lesions.  NECK: Supple. No thyromegaly. No nodules. No JVD.  PULMONARY: Clear to ascultation, without wheeze rails or rhonci. No use of accessory muscles, Good respiratory effort. good air entry bilaterally CHEST: Nontender to palpation.  CARDIOVASCULAR: S1 and S2. Regular rate and rhythm. 3/6 systolic ejection murmur, rubs, or gallops. No edema. Pedal pulses 2+ bilaterally.  GASTROINTESTINAL: Soft, nontender, nondistended. No masses. Positive bowel sounds. No hepatosplenomegaly.  MUSCULOSKELETAL: No swelling, clubbing, or edema. Range of motion full in all extremities.  NEUROLOGIC: Cranial nerves II through XII are intact. No gross focal neurological deficits. Sensation intact. Reflexes intact.  SKIN: No ulceration, lesions, rashes, or cyanosis. Skin warm and dry. Turgor intact.  PSYCHIATRIC: Mood, affect within normal limits. The patient is awake, alert and oriented x 3. Insight, judgment intact.    LABORATORY PANEL:   CBC  Recent Labs Lab 04/22/15 0351  WBC 10.3  HGB 13.2  HCT 39.5*  PLT 262   ------------------------------------------------------------------------------------------------------------------  Chemistries   Recent Labs Lab 04/22/15 0351  NA 136  K 4.2  CL 102  CO2 23  GLUCOSE 125*  BUN 18  CREATININE 0.81  CALCIUM 9.2    ------------------------------------------------------------------------------------------------------------------  Cardiac Enzymes  Recent Labs Lab 04/22/15 0351  TROPONINI 0.10*   ------------------------------------------------------------------------------------------------------------------  RADIOLOGY:  Dg Chest 2 View  04/22/2015  CLINICAL DATA:  Acute onset of shortness of breath and left-sided chest tightness. Initial encounter. EXAM: CHEST  2 VIEW COMPARISON:  Chest radiograph and CTA of the chest performed 03/18/2015 FINDINGS: The lungs are well-aerated. Vascular congestion is noted. Increased interstitial markings raise concern for pulmonary edema. There is no evidence of pleural effusion or pneumothorax. The heart is normal in size; the mediastinal contour is within normal limits. No acute osseous abnormalities are seen. IMPRESSION: Vascular congestion noted. Increased interstitial markings raise concern for pulmonary edema. Electronically Signed   By: Garald Balding M.D.   On: 04/22/2015 04:48    EKG:   Orders placed or performed during the hospital encounter of 03/18/15  . ED EKG  . ED EKG  . EKG 12-Lead  . EKG 12-Lead  . EKG 12-Lead  . EKG 12-Lead    IMPRESSION AND PLAN:   45 year old Caucasian gentleman history of aortic stenosis recently discovered presenting with chest pain shortness breath  1.Chest pain, central: Initiate aspirin and statin therapy, admitted to telemetry, trend cardiac enzymes 3,  if continued elevation will initiate heparin drip ,nitroglycerin when necessary, morphine when necessary, consult cardiology follows with dr Humphrey Rolls.  2. Type 2 diabetes mellitus requiring: Hold oral agents at insulin sliding scale with Accu-Cheks  3. Essential hypertension: Continue home medications 4. Venous thrombi was provided: Heparin subcutaneous     All the records are reviewed and case discussed with ED provider. Management plans discussed with the  patient, family and they are in agreement.  CODE STATUS: Full  TOTAL TIME TAKING CARE OF THIS PATIENT: 35 minutes.    Charley Lafrance,  Karenann Cai.D on 04/22/2015 at 8:16 AM  Between 7am to 6pm - Pager - [phone]  After 6pm: House Pager: - [facility]  Office  [phone]  CC: Primary care physician; Volanda Napoleon, MD

## 2015-04-22 NOTE — Care Management (Signed)
Informed that patient is for transfer to [facility] for cardiac intervention not performed at [facility]- TAVR.  Patient is asking if patient's insurance is in network with [facility].  First attempt was informed that the following hospitals were not in network: [employer],  [place], [place], [place], [place], [place], [place]. Explained that this list contained every hospital in the area.  CM was informed that [facility] has informed patient that "everything is okay with payor. Was told that Alvester Chou from [facility] has the information that patient's insurance is in network.  Alvester Chou says that he was informed by his "insurance person"  received authorization from insurance.  This CM was not able to speak directly with this person from [facility].  Made a third call to [facility] and provided zip codes to all of the surround facilities and informed that [facility] and [facility] in network with patient's insurance.   He is for transport by [facility] .  Patient is very anxious over current circumstances.

## 2015-04-22 NOTE — Progress Notes (Signed)
Russell Ray is a 45 y.o. male  [id number]  Primary Cardiologist: Neoma Laming Reason for Consultation: Aortic stenosis chest pain  HPI: This is a 45 year old white male with a past medical history of severe aortic stenosis by echocardiogram was scheduled for CTA coronaries in the office but because of his weight the quality of the study would have been suboptimal. It was decided to consider TAVR, but needs evaluation of the coronaries. He did he got now admitted with chest pain and shortness of breath and has been admitted a month ago with the congestive heart failure symptoms which got better with Lasix.   Review of Systems: Positive orthopnea PND and leg swelling   Past Medical History  Diagnosis Date  . Diabetes mellitus without complication ([place])   . Hypertension   . Anxiety     Medications Prior to Admission  Medication Sig Dispense Refill  . aspirin EC 81 MG tablet Take 1 tablet (81 mg total) by mouth daily. 30 tablet 0  . cyclobenzaprine (FLEXERIL) 5 MG tablet Take 5 mg by mouth 3 (three) times daily as needed.    . furosemide (LASIX) 40 MG tablet Take 1 tablet (40 mg total) by mouth daily. 30 tablet 6  . lisinopril (PRINIVIL,ZESTRIL) 10 MG tablet Take 10 mg by mouth daily.  2  . LORazepam (ATIVAN) 1 MG tablet Take 1 mg by mouth 2 (two) times daily.  2  . metFORMIN (GLUCOPHAGE) 500 MG tablet Take 500 mg by mouth 2 (two) times daily.  0  . naproxen sodium (ANAPROX) 550 MG tablet Take 550 mg by mouth 2 (two) times daily as needed for mild pain.     . nicotine (NICODERM CQ - DOSED IN MG/24 HOURS) 14 mg/24hr patch Place 14 mg onto the skin daily.    Marland Kitchen senna-docusate (SENOKOT-S) 8.6-50 MG tablet Take 1 tablet by mouth at bedtime as needed for mild constipation. 30 tablet 6     . aspirin EC  81 mg Oral Daily  . furosemide  40 mg Oral Daily  . heparin  5,000 Units Subcutaneous 3 times per day  . insulin aspart  0-5 Units Subcutaneous QHS  . insulin aspart  0-9 Units  Subcutaneous TID WC  . lisinopril  10 mg Oral Daily  . LORazepam  1 mg Oral BID  . nicotine  14 mg Transdermal Daily  . sodium chloride flush  3 mL Intravenous Q12H    Infusions:    No Known Allergies  Social History   Social History  . Marital Status: Married    Spouse Name: N/A  . Number of Children: N/A  . Years of Education: N/A   Occupational History  . Not on file.   Social History Main Topics  . Smoking status: Former Smoker    Types: Cigarettes  . Smokeless tobacco: Not on file  . Alcohol Use: No  . Drug Use: No  . Sexual Activity: Not on file   Other Topics Concern  . Not on file   Social History Narrative    Family History  Problem Relation Age of Onset  . Hypertension Mother   . Hypertension Maternal Grandmother   . Diabetes Maternal Grandmother     PHYSICAL EXAM: Filed Vitals:   04/22/15 0800 04/22/15 0842  BP: 100/47 112/81  Pulse: 84 91  Temp:    Resp: 17     No intake or output data in the 24 hours ending 04/22/15 0950  General:  Well appearing. No  respiratory difficulty HEENT: normal Neck: supple. no JVD. Carotids 2+ bilat; no bruits. No lymphadenopathy or thryomegaly appreciated. Cor: PMI nondisplaced. Regular rate & rhythm. No rubs, gallops or murmurs. Lungs: clear Abdomen: soft, nontender, nondistended. No hepatosplenomegaly. No bruits or masses. Good bowel sounds. Extremities: no cyanosis, clubbing, rash, edema Neuro: alert & oriented x 3, cranial nerves grossly intact. moves all 4 extremities w/o difficulty. Affect pleasant.  ECG: Sinus rhythm and poor R-wave progression nonspecific ST-T changes  Results for orders placed or performed during the hospital encounter of 04/22/15 (from the past 24 hour(s))  Brain natriuretic peptide     Status: Abnormal   Collection Time: 04/22/15  3:51 AM  Result Value Ref Range   B Natriuretic Peptide 248.0 (H) 0.0 - 100.0 pg/mL  Basic metabolic panel     Status: Abnormal   Collection Time:  04/22/15  3:51 AM  Result Value Ref Range   Sodium 136 135 - 145 mmol/L   Potassium 4.2 3.5 - 5.1 mmol/L   Chloride 102 101 - 111 mmol/L   CO2 23 22 - 32 mmol/L   Glucose, Bld 125 (H) 65 - 99 mg/dL   BUN 18 6 - 20 mg/dL   Creatinine, Ser 0.81 0.61 - 1.24 mg/dL   Calcium 9.2 8.9 - 10.3 mg/dL   GFR calc non Af Amer >60 >60 mL/min   GFR calc Af Amer >60 >60 mL/min   Anion gap 11 5 - 15  Troponin I     Status: Abnormal   Collection Time: 04/22/15  3:51 AM  Result Value Ref Range   Troponin I 0.10 (H) <0.031 ng/mL  CBC with Differential     Status: Abnormal   Collection Time: 04/22/15  3:51 AM  Result Value Ref Range   WBC 10.3 3.8 - 10.6 K/uL   RBC 4.99 4.40 - 5.90 MIL/uL   Hemoglobin 13.2 13.0 - 18.0 g/dL   HCT 39.5 (L) 40.0 - 52.0 %   MCV 79.2 (L) 80.0 - 100.0 fL   MCH 26.6 26.0 - 34.0 pg   MCHC 33.5 32.0 - 36.0 g/dL   RDW 16.9 (H) 11.5 - 14.5 %   Platelets 262 150 - 440 K/uL   Neutrophils Relative % 71% %   Neutro Abs 7.3 (H) 1.4 - 6.5 K/uL   Lymphocytes Relative 20% %   Lymphs Abs 2.0 1.0 - 3.6 K/uL   Monocytes Relative 6% %   Monocytes Absolute 0.6 0.2 - 1.0 K/uL   Eosinophils Relative 2% %   Eosinophils Absolute 0.2 0 - 0.7 K/uL   Basophils Relative 1% %   Basophils Absolute 0.1 0 - 0.1 K/uL   Dg Chest 2 View  04/22/2015  CLINICAL DATA:  Acute onset of shortness of breath and left-sided chest tightness. Initial encounter. EXAM: CHEST  2 VIEW COMPARISON:  Chest radiograph and CTA of the chest performed 03/18/2015 FINDINGS: The lungs are well-aerated. Vascular congestion is noted. Increased interstitial markings raise concern for pulmonary edema. There is no evidence of pleural effusion or pneumothorax. The heart is normal in size; the mediastinal contour is within normal limits. No acute osseous abnormalities are seen. IMPRESSION: Vascular congestion noted. Increased interstitial markings raise concern for pulmonary edema. Electronically Signed   By: Garald Balding M.D.    On: 04/22/2015 04:48     ASSESSMENT AND PLAN: Severe aortic stenosis with documented CHF and chest pain and mildly elevated troponin suggestive of acute coronary syndrome. Plan is to go ahead and do a  right and left heart catheterization prior toTAVR.  Chadwick A atop

## 2015-04-22 NOTE — Progress Notes (Signed)
Patient transported to Cath Lab.  VSS.  Having some pain, but unable to give pain medication d/t fluctuating BP.

## 2015-04-22 NOTE — ED Provider Notes (Signed)
[facility] Emergency Department Provider Note  ____________________________________________  Time seen: Seen upon arrival to the emergency department  I have reviewed the triage vital signs and the nursing notes.   HISTORY  Chief Complaint Chest Pain    HPI Russell Ray is a 45 y.o. male with a history of diabetes, hypertension and CHF who is presenting to the emergency department today with chest pain and shortness of breath. He says that he woke up from sleep at about midnight with left-sided, 4 out of 10, chest pain as well as difficulty breathing. He says that the breathing difficulty is not worsened with exertion. Says the chest pain feels like a pressure. Was recently diagnosed with congestive heart failure as well as aortic stenosis and is taking Lasix. He is a patient of Dr. Humphrey Rolls of cardiology service. He was given aspirin as well as 1 nitroglycerin spray en route which did not help his pain. He says that this shortness of breath is intermittent. Nonradiating chest pain over the left chest. Denies any nausea or vomiting.Patient says that he was supposed to be doing a sleep study tonight. Says that he thinks he has sleep apnea. Says that he snores loudly per his wife.   Past Medical History  Diagnosis Date  . Diabetes mellitus without complication ([place])   . Hypertension   . Anxiety     Patient Active Problem List   Diagnosis Date Noted  . Morbid obesity ([place]) 03/19/2015  . Leg swelling 03/19/2015  . Dyspnea 03/19/2015  . Orthopnea 03/19/2015  . Tobacco abuse 03/19/2015  . Elevated troponin 03/19/2015  . Acute diastolic CHF (congestive heart failure) ([place]) 03/18/2015    Past Surgical History  Procedure Laterality Date  . Hernia repair      X3    Current Outpatient Rx  Name  Route  Sig  Dispense  Refill  . aspirin EC 81 MG tablet   Oral   Take 1 tablet (81 mg total) by mouth daily.   30 tablet   0   . cyclobenzaprine (FLEXERIL) 5 MG  tablet   Oral   Take 5 mg by mouth 3 (three) times daily as needed.         . furosemide (LASIX) 40 MG tablet   Oral   Take 1 tablet (40 mg total) by mouth daily.   30 tablet   6   . lisinopril (PRINIVIL,ZESTRIL) 10 MG tablet   Oral   Take 10 mg by mouth daily.      2   . LORazepam (ATIVAN) 1 MG tablet   Oral   Take 1 mg by mouth 2 (two) times daily.      2   . metFORMIN (GLUCOPHAGE) 500 MG tablet   Oral   Take 500 mg by mouth 2 (two) times daily.      0   . naproxen sodium (ANAPROX) 550 MG tablet   Oral   Take 550 mg by mouth 2 (two) times daily as needed for mild pain.          . nicotine (NICODERM CQ - DOSED IN MG/24 HOURS) 14 mg/24hr patch   Transdermal   Place 14 mg onto the skin daily.         Marland Kitchen senna-docusate (SENOKOT-S) 8.6-50 MG tablet   Oral   Take 1 tablet by mouth at bedtime as needed for mild constipation.   30 tablet   6     Allergies Review of patient's allergies indicates no  known allergies.  Family History  Problem Relation Age of Onset  . Hypertension Mother   . Hypertension Maternal Grandmother   . Diabetes Maternal Grandmother     Social History Social History  Substance Use Topics  . Smoking status: Former Smoker    Types: Cigarettes  . Smokeless tobacco: None  . Alcohol Use: No    Review of Systems Constitutional: No fever/chills Eyes: No visual changes. ENT: No sore throat. Cardiovascular: As above  Respiratory: As above Gastrointestinal: No abdominal pain.  No nausea, no vomiting.  No diarrhea.  No constipation. Genitourinary: Negative for dysuria. Musculoskeletal: Negative for back pain. Skin: Negative for rash. Neurological: Negative for headaches, focal weakness or numbness.  10-point ROS otherwise negative.  ____________________________________________   PHYSICAL EXAM:  VITAL SIGNS: ED Triage Vitals  Enc Vitals Group     BP 04/22/15 0351 118/64 mmHg     Pulse Rate 04/22/15 0346 100     Resp  04/22/15 0346 18     Temp 04/22/15 0346 98 F (36.7 C)     Temp Source 04/22/15 0346 Oral     SpO2 04/22/15 0346 97 %     Weight 04/22/15 0346 390 lb (176.903 kg)     Height 04/22/15 0346 6' (1.829 m)     Head Cir --      Peak Flow --      Pain Score 04/22/15 0350 4     Pain Loc --      Pain Edu? --      Excl. in [place]? --     Constitutional: Alert and oriented. Well appearing and in no acute distress. Morbidly obese. Eyes: Conjunctivae are normal. PERRL. EOMI. Head: Atraumatic. Nose: No congestion/rhinnorhea. Mouth/Throat: Mucous membranes are moist.  Oropharynx non-erythematous. Neck: No stridor.   Cardiovascular: Normal rate, regular rhythm. Grossly normal heart sounds.  Good peripheral circulation. Respiratory: Mild to moderate increased respiratory effort.  No retractions. Prolonged expiratory phase throughout. Gastrointestinal: Soft and nontender. No distention. No abdominal bruits. No CVA tenderness. Musculoskeletal: No lower extremity tenderness nor edema.  No joint effusions. Neurologic:  Normal speech and language. No gross focal neurologic deficits are appreciated. No gait instability. Skin:  Skin is warm, dry and intact. No rash noted. Psychiatric: Mood and affect are normal. Speech and behavior are normal.  ____________________________________________   LABS (all labs ordered are listed, but only abnormal results are displayed)  Labs Reviewed  BRAIN NATRIURETIC PEPTIDE - Abnormal; Notable for the following:    B Natriuretic Peptide 248.0 (*)    All other components within normal limits  BASIC METABOLIC PANEL - Abnormal; Notable for the following:    Glucose, Bld 125 (*)    All other components within normal limits  TROPONIN I - Abnormal; Notable for the following:    Troponin I 0.10 (*)    All other components within normal limits  CBC WITH DIFFERENTIAL/PLATELET - Abnormal; Notable for the following:    HCT 39.5 (*)    MCV 79.2 (*)    RDW 16.9 (*)    Neutro  Abs 7.3 (*)    All other components within normal limits   ____________________________________________  EKG  ED ECG REPORT I, Doran Stabler, the attending physician, personally viewed and interpreted this ECG.   Date: 04/22/2015  EKG Time: 345  Rate: 104  Rhythm: sinus tachycardia  Axis: Normal axis  Intervals:none  ST&T Change: T wave inversions in 1, aVL, V6. Inversion in V6 is new from 2014.  ____________________________________________  RADIOLOGY  IMPRESSION: Vascular congestion noted. Increased interstitial markings raise concern for pulmonary edema.  ____________________________________________   PROCEDURES    ____________________________________________   INITIAL IMPRESSION / ASSESSMENT AND PLAN / ED COURSE  Pertinent labs & imaging results that were available during my care of the patient were reviewed by me and considered in my medical decision making (see chart for details).  ----------------------------------------- 6:41 AM on 04/22/2015 -----------------------------------------  She resting comfortably at this time. Reviewed the lab results as well as imaging with the patient. He is aware of the need for admission to the hospital. Signed out to Dr. Marcille Blanco. My concern is that the patient has a slightly elevated troponin from his baseline with pulmonary edema. This in the context of his shortness of breath and chest pain is concerning. ____________________________________________   FINAL CLINICAL IMPRESSION(S) / ED DIAGNOSES  Chest pain. Shortness of breath. Pulmonary edema.    Orbie Pyo, MD 04/22/15 [phone]

## 2015-04-22 NOTE — Progress Notes (Signed)
MD Hower was notified of BP is low. 500 cc bolus NS and monitor.

## 2015-04-22 NOTE — H&P (Signed)
Report called to [facility] to [place]. BP improved.

## 2015-04-22 NOTE — Progress Notes (Signed)
MD hower notified of bp 80/43. No further orders.

## 2015-04-22 NOTE — ED Notes (Signed)
Pt arrived from home by EMS with c/o of SOB and chest pain that woke pt up during night. Pt reports that he was diagnosed with aortic stenosis, needing a valve replacement. Pt reports that he has been a smoker for 28 years and has recently stopped smoking 5 weeks ago. Pt states he has worked in a lot of dust at his job in the past week.

## 2015-04-23 DIAGNOSIS — F419 Anxiety disorder, unspecified: Secondary | ICD-10-CM | POA: Insufficient documentation

## 2015-04-23 DIAGNOSIS — E119 Type 2 diabetes mellitus without complications: Secondary | ICD-10-CM | POA: Insufficient documentation

## 2015-04-23 DIAGNOSIS — I509 Heart failure, unspecified: Secondary | ICD-10-CM | POA: Insufficient documentation

## 2015-04-23 DIAGNOSIS — I5032 Chronic diastolic (congestive) heart failure: Secondary | ICD-10-CM | POA: Insufficient documentation

## 2015-04-23 DIAGNOSIS — I10 Essential (primary) hypertension: Secondary | ICD-10-CM | POA: Insufficient documentation

## 2015-04-25 ENCOUNTER — Encounter: Payer: Self-pay | Admitting: Cardiovascular Disease

## 2015-05-03 ENCOUNTER — Emergency Department: Payer: 59

## 2015-05-03 ENCOUNTER — Inpatient Hospital Stay
Admission: EM | Admit: 2015-05-03 | Discharge: 2015-05-05 | DRG: 309 | Disposition: A | Discharge status: Home / Self Care | Payer: 59 | Attending: Internal Medicine | Admitting: Internal Medicine

## 2015-05-03 ENCOUNTER — Encounter: Payer: Self-pay | Admitting: Emergency Medicine

## 2015-05-03 DIAGNOSIS — E669 Obesity, unspecified: Secondary | ICD-10-CM | POA: Diagnosis present

## 2015-05-03 DIAGNOSIS — I4892 Unspecified atrial flutter: Secondary | ICD-10-CM | POA: Diagnosis present

## 2015-05-03 DIAGNOSIS — Z7901 Long term (current) use of anticoagulants: Secondary | ICD-10-CM | POA: Diagnosis not present

## 2015-05-03 DIAGNOSIS — G4733 Obstructive sleep apnea (adult) (pediatric): Secondary | ICD-10-CM | POA: Diagnosis present

## 2015-05-03 DIAGNOSIS — Z79899 Other long term (current) drug therapy: Secondary | ICD-10-CM

## 2015-05-03 DIAGNOSIS — Z952 Presence of prosthetic heart valve: Secondary | ICD-10-CM

## 2015-05-03 DIAGNOSIS — I10 Essential (primary) hypertension: Secondary | ICD-10-CM | POA: Diagnosis present

## 2015-05-03 DIAGNOSIS — E8770 Fluid overload, unspecified: Secondary | ICD-10-CM | POA: Diagnosis present

## 2015-05-03 DIAGNOSIS — F17210 Nicotine dependence, cigarettes, uncomplicated: Secondary | ICD-10-CM | POA: Diagnosis present

## 2015-05-03 DIAGNOSIS — Z6841 Body Mass Index (BMI) 40.0 and over, adult: Secondary | ICD-10-CM

## 2015-05-03 DIAGNOSIS — Z7984 Long term (current) use of oral hypoglycemic drugs: Secondary | ICD-10-CM | POA: Diagnosis not present

## 2015-05-03 DIAGNOSIS — D649 Anemia, unspecified: Secondary | ICD-10-CM | POA: Diagnosis present

## 2015-05-03 DIAGNOSIS — I4891 Unspecified atrial fibrillation: Secondary | ICD-10-CM | POA: Diagnosis present

## 2015-05-03 DIAGNOSIS — E1165 Type 2 diabetes mellitus with hyperglycemia: Secondary | ICD-10-CM | POA: Diagnosis present

## 2015-05-03 DIAGNOSIS — Z7982 Long term (current) use of aspirin: Secondary | ICD-10-CM | POA: Diagnosis not present

## 2015-05-03 DIAGNOSIS — Z8249 Family history of ischemic heart disease and other diseases of the circulatory system: Secondary | ICD-10-CM

## 2015-05-03 DIAGNOSIS — Z79891 Long term (current) use of opiate analgesic: Secondary | ICD-10-CM | POA: Diagnosis not present

## 2015-05-03 DIAGNOSIS — D72829 Elevated white blood cell count, unspecified: Secondary | ICD-10-CM

## 2015-05-03 DIAGNOSIS — I248 Other forms of acute ischemic heart disease: Secondary | ICD-10-CM | POA: Diagnosis present

## 2015-05-03 DIAGNOSIS — F419 Anxiety disorder, unspecified: Secondary | ICD-10-CM | POA: Diagnosis present

## 2015-05-03 DIAGNOSIS — R778 Other specified abnormalities of plasma proteins: Secondary | ICD-10-CM

## 2015-05-03 DIAGNOSIS — E871 Hypo-osmolality and hyponatremia: Secondary | ICD-10-CM

## 2015-05-03 DIAGNOSIS — R7989 Other specified abnormal findings of blood chemistry: Secondary | ICD-10-CM

## 2015-05-03 DIAGNOSIS — D509 Iron deficiency anemia, unspecified: Secondary | ICD-10-CM

## 2015-05-03 LAB — URINE DRUG SCREEN, QUALITATIVE (ARMC ONLY)
Amphetamines, Ur Screen: NOT DETECTED
BARBITURATES, UR SCREEN: NOT DETECTED
Benzodiazepine, Ur Scrn: NOT DETECTED
CANNABINOID 50 NG, UR ~~LOC~~: NOT DETECTED
COCAINE METABOLITE, UR ~~LOC~~: NOT DETECTED
MDMA (ECSTASY) UR SCREEN: NOT DETECTED
Methadone Scn, Ur: NOT DETECTED
OPIATE, UR SCREEN: NOT DETECTED
PHENCYCLIDINE (PCP) UR S: NOT DETECTED
TRICYCLIC, UR SCREEN: NOT DETECTED

## 2015-05-03 LAB — GLUCOSE, CAPILLARY
GLUCOSE-CAPILLARY: 110 mg/dL — AB (ref 65–99)
Glucose-Capillary: 159 mg/dL — ABNORMAL HIGH (ref 65–99)

## 2015-05-03 LAB — CBC
HCT: 28.9 % — ABNORMAL LOW (ref 40.0–52.0)
Hemoglobin: 9.4 g/dL — ABNORMAL LOW (ref 13.0–18.0)
MCH: 25.7 pg — ABNORMAL LOW (ref 26.0–34.0)
MCHC: 32.4 g/dL (ref 32.0–36.0)
MCV: 79.3 fL — AB (ref 80.0–100.0)
PLATELETS: 291 10*3/uL (ref 150–440)
RBC: 3.65 MIL/uL — ABNORMAL LOW (ref 4.40–5.90)
RDW: 17.5 % — AB (ref 11.5–14.5)
WBC: 12.2 10*3/uL — AB (ref 3.8–10.6)

## 2015-05-03 LAB — COMPREHENSIVE METABOLIC PANEL
ALBUMIN: 3.5 g/dL (ref 3.5–5.0)
ALT: 160 U/L — ABNORMAL HIGH (ref 17–63)
ANION GAP: 10 (ref 5–15)
AST: 27 U/L (ref 15–41)
Alkaline Phosphatase: 96 U/L (ref 38–126)
BILIRUBIN TOTAL: 0.7 mg/dL (ref 0.3–1.2)
BUN: 17 mg/dL (ref 6–20)
CHLORIDE: 100 mmol/L — AB (ref 101–111)
CO2: 24 mmol/L (ref 22–32)
Calcium: 8.8 mg/dL — ABNORMAL LOW (ref 8.9–10.3)
Creatinine, Ser: 0.62 mg/dL (ref 0.61–1.24)
GFR calc Af Amer: >60 mL/min (ref >60)
GLUCOSE: 124 mg/dL — AB (ref 65–99)
POTASSIUM: 3.9 mmol/L (ref 3.5–5.1)
Sodium: 134 mmol/L — ABNORMAL LOW (ref 135–145)
TOTAL PROTEIN: 6.5 g/dL (ref 6.5–8.1)

## 2015-05-03 LAB — PROTIME-INR
INR: 1.56
Prothrombin Time: 18.7 seconds — ABNORMAL HIGH (ref 11.4–15.0)

## 2015-05-03 LAB — TROPONIN I
TROPONIN I: 0.23 ng/mL — AB (ref <0.031)
Troponin I: 0.19 ng/mL — ABNORMAL HIGH (ref <0.031)
Troponin I: 0.27 ng/mL — ABNORMAL HIGH (ref <0.031)

## 2015-05-03 LAB — APTT: APTT: 33 s (ref 24–36)

## 2015-05-03 LAB — MRSA PCR SCREENING: MRSA by PCR: NEGATIVE

## 2015-05-03 MED ORDER — ENOXAPARIN SODIUM 150 MG/ML ~~LOC~~ SOLN
1.0000 mg/kg | Freq: Once | SUBCUTANEOUS | Status: AC
  Administered 2015-05-03: 175 mg via SUBCUTANEOUS
  Filled 2015-05-03: qty 1.18

## 2015-05-03 MED ORDER — HEPARIN BOLUS VIA INFUSION
7250.0000 [IU] | Freq: Once | INTRAVENOUS | Status: DC
  Filled 2015-05-03: qty 7250

## 2015-05-03 MED ORDER — CYCLOBENZAPRINE HCL 10 MG PO TABS
5.0000 mg | ORAL_TABLET | Freq: Three times a day (TID) | ORAL | Status: DC | PRN
Start: 2015-05-03 — End: 2015-05-05

## 2015-05-03 MED ORDER — INSULIN ASPART 100 UNIT/ML ~~LOC~~ SOLN
3.0000 [IU] | Freq: Three times a day (TID) | SUBCUTANEOUS | Status: DC
  Administered 2015-05-04: 3 [IU] via SUBCUTANEOUS
  Filled 2015-05-03 (×2): qty 3

## 2015-05-03 MED ORDER — ASPIRIN EC 81 MG PO TBEC
81.0000 mg | DELAYED_RELEASE_TABLET | Freq: Every day | ORAL | Status: DC
  Administered 2015-05-04 – 2015-05-05 (×2): 81 mg via ORAL
  Filled 2015-05-03 (×2): qty 1

## 2015-05-03 MED ORDER — SODIUM CHLORIDE 0.9% FLUSH
3.0000 mL | Freq: Two times a day (BID) | INTRAVENOUS | Status: DC
  Administered 2015-05-03: 3 mL via INTRAVENOUS

## 2015-05-03 MED ORDER — ACETAMINOPHEN 325 MG PO TABS
650.0000 mg | ORAL_TABLET | Freq: Four times a day (QID) | ORAL | Status: DC | PRN
  Administered 2015-05-05: 650 mg via ORAL
  Filled 2015-05-03: qty 2

## 2015-05-03 MED ORDER — VITAMIN B-12 1000 MCG PO TABS
1000.0000 ug | ORAL_TABLET | Freq: Every day | ORAL | Status: DC
  Administered 2015-05-04 – 2015-05-05 (×2): 1000 ug via ORAL
  Filled 2015-05-03 (×2): qty 1

## 2015-05-03 MED ORDER — LORAZEPAM 1 MG PO TABS
ORAL_TABLET | ORAL | Status: AC
  Administered 2015-05-03: 1 mg via ORAL
  Filled 2015-05-03: qty 1

## 2015-05-03 MED ORDER — DILTIAZEM HCL 25 MG/5ML IV SOLN
INTRAVENOUS | Status: AC
  Administered 2015-05-03: 10 mg via INTRAVENOUS
  Filled 2015-05-03: qty 5

## 2015-05-03 MED ORDER — HYDROMORPHONE HCL 1 MG/ML IJ SOLN
1.0000 mg | Freq: Once | INTRAMUSCULAR | Status: AC
  Administered 2015-05-03: 1 mg via INTRAVENOUS
  Filled 2015-05-03: qty 1

## 2015-05-03 MED ORDER — WARFARIN SODIUM 1 MG PO TABS
3.0000 mg | ORAL_TABLET | Freq: Once | ORAL | Status: AC
  Administered 2015-05-03: 3 mg via ORAL
  Filled 2015-05-03: qty 1

## 2015-05-03 MED ORDER — AMIODARONE HCL IN DEXTROSE 360-4.14 MG/200ML-% IV SOLN
INTRAVENOUS | Status: AC
  Filled 2015-05-03: qty 200

## 2015-05-03 MED ORDER — DILTIAZEM HCL 25 MG/5ML IV SOLN
10.0000 mg | Freq: Once | INTRAVENOUS | Status: AC
  Administered 2015-05-03: 10 mg via INTRAVENOUS

## 2015-05-03 MED ORDER — FUROSEMIDE 10 MG/ML IJ SOLN
40.0000 mg | Freq: Two times a day (BID) | INTRAMUSCULAR | Status: DC
  Administered 2015-05-04 – 2015-05-05 (×3): 40 mg via INTRAVENOUS
  Filled 2015-05-03 (×3): qty 4

## 2015-05-03 MED ORDER — METOPROLOL TARTRATE 1 MG/ML IV SOLN
5.0000 mg | Freq: Once | INTRAVENOUS | Status: AC
  Administered 2015-05-03: 5 mg via INTRAVENOUS
  Filled 2015-05-03: qty 5

## 2015-05-03 MED ORDER — FAMOTIDINE 20 MG PO TABS
20.0000 mg | ORAL_TABLET | Freq: Two times a day (BID) | ORAL | Status: DC
  Administered 2015-05-03 – 2015-05-05 (×4): 20 mg via ORAL
  Filled 2015-05-03 (×4): qty 1

## 2015-05-03 MED ORDER — HEPARIN (PORCINE) IN NACL 100-0.45 UNIT/ML-% IJ SOLN
1800.0000 [IU]/h | INTRAMUSCULAR | Status: DC
  Filled 2015-05-03: qty 250

## 2015-05-03 MED ORDER — SENNA 8.6 MG PO TABS: 1.0000 | ORAL_TABLET | Freq: Every day | ORAL | Status: DC | PRN

## 2015-05-03 MED ORDER — SODIUM CHLORIDE 0.9% FLUSH
3.0000 mL | Freq: Two times a day (BID) | INTRAVENOUS | Status: DC
  Administered 2015-05-04 – 2015-05-05 (×2): 3 mL via INTRAVENOUS

## 2015-05-03 MED ORDER — POLYETHYLENE GLYCOL 3350 17 G PO PACK: 17.0000 g | PACK | Freq: Every day | ORAL | Status: DC | PRN

## 2015-05-03 MED ORDER — AMIODARONE HCL IN DEXTROSE 360-4.14 MG/200ML-% IV SOLN
60.0000 mg/h | Freq: Once | INTRAVENOUS | Status: AC
  Administered 2015-05-03: 60 mg/h via INTRAVENOUS
  Filled 2015-05-03: qty 200

## 2015-05-03 MED ORDER — ZOLPIDEM TARTRATE 5 MG PO TABS: 5.0000 mg | ORAL_TABLET | Freq: Every evening | ORAL | Status: DC | PRN

## 2015-05-03 MED ORDER — NICOTINE 7 MG/24HR TD PT24
14.0000 mg | MEDICATED_PATCH | Freq: Every day | TRANSDERMAL | Status: DC
  Administered 2015-05-04 – 2015-05-05 (×2): 14 mg via TRANSDERMAL
  Filled 2015-05-03 (×2): qty 2

## 2015-05-03 MED ORDER — METFORMIN HCL 500 MG PO TABS
500.0000 mg | ORAL_TABLET | Freq: Two times a day (BID) | ORAL | Status: DC
  Administered 2015-05-03 – 2015-05-05 (×4): 500 mg via ORAL
  Filled 2015-05-03 (×4): qty 1

## 2015-05-03 MED ORDER — WARFARIN SODIUM 4 MG PO TABS: 4.0000 mg | ORAL_TABLET | Freq: Every day | ORAL | Status: DC

## 2015-05-03 MED ORDER — OXYCODONE HCL 5 MG PO TABS
5.0000 mg | ORAL_TABLET | ORAL | Status: DC | PRN
  Administered 2015-05-03 – 2015-05-05 (×10): 5 mg via ORAL
  Filled 2015-05-03 (×9): qty 1

## 2015-05-03 MED ORDER — OXYCODONE HCL 5 MG PO TABS
ORAL_TABLET | ORAL | Status: AC
  Administered 2015-05-03: 5 mg via ORAL
  Filled 2015-05-03: qty 1

## 2015-05-03 MED ORDER — ENOXAPARIN SODIUM 100 MG/ML ~~LOC~~ SOLN
SUBCUTANEOUS | Status: AC
  Administered 2015-05-03: 175 mg
  Filled 2015-05-03: qty 2

## 2015-05-03 MED ORDER — FUROSEMIDE 10 MG/ML IJ SOLN
40.0000 mg | Freq: Two times a day (BID) | INTRAMUSCULAR | Status: DC
  Administered 2015-05-03: 40 mg via INTRAVENOUS
  Filled 2015-05-03: qty 4

## 2015-05-03 MED ORDER — AMIODARONE IV BOLUS ONLY 150 MG/100ML
150.0000 mg | Freq: Once | INTRAVENOUS | Status: AC
  Administered 2015-05-03: 150 mg via INTRAVENOUS
  Filled 2015-05-03: qty 100

## 2015-05-03 MED ORDER — DEXTROSE 5 % IV SOLN: 60.0000 mg/h | Freq: Once | INTRAVENOUS | Status: DC

## 2015-05-03 MED ORDER — WARFARIN - PHARMACIST DOSING INPATIENT
Freq: Every day | Status: DC
  Filled 2015-05-03 (×2): qty 1

## 2015-05-03 MED ORDER — ACETAMINOPHEN 650 MG RE SUPP
650.0000 mg | Freq: Four times a day (QID) | RECTAL | Status: DC | PRN
Start: 2015-05-03 — End: 2015-05-05

## 2015-05-03 MED ORDER — SODIUM CHLORIDE 0.9% FLUSH: 3.0000 mL | INTRAVENOUS | Status: DC | PRN

## 2015-05-03 MED ORDER — AMIODARONE IV BOLUS ONLY 150 MG/100ML
INTRAVENOUS | Status: AC
  Filled 2015-05-03: qty 100

## 2015-05-03 MED ORDER — GI COCKTAIL ~~LOC~~
ORAL | Status: AC
  Filled 2015-05-03: qty 30

## 2015-05-03 MED ORDER — POTASSIUM CHLORIDE CRYS ER 20 MEQ PO TBCR
20.0000 meq | EXTENDED_RELEASE_TABLET | Freq: Two times a day (BID) | ORAL | Status: DC
  Administered 2015-05-03 – 2015-05-05 (×4): 20 meq via ORAL
  Filled 2015-05-03 (×4): qty 1

## 2015-05-03 MED ORDER — SODIUM CHLORIDE 0.9 % IV SOLN
250.0000 mL | INTRAVENOUS | Status: DC | PRN
  Administered 2015-05-05: 09:00:00 via INTRAVENOUS

## 2015-05-03 MED ORDER — DOCUSATE SODIUM 100 MG PO CAPS
100.0000 mg | ORAL_CAPSULE | Freq: Two times a day (BID) | ORAL | Status: DC
  Administered 2015-05-03 – 2015-05-05 (×3): 100 mg via ORAL
  Filled 2015-05-03 (×3): qty 1

## 2015-05-03 MED ORDER — LORAZEPAM 0.5 MG PO TABS
1.0000 mg | ORAL_TABLET | Freq: Four times a day (QID) | ORAL | Status: DC | PRN
  Administered 2015-05-03 – 2015-05-05 (×5): 1 mg via ORAL
  Filled 2015-05-03 (×2): qty 2
  Filled 2015-05-03: qty 1
  Filled 2015-05-03 (×3): qty 2

## 2015-05-03 MED ORDER — ACETAMINOPHEN 325 MG PO TABS
650.0000 mg | ORAL_TABLET | ORAL | Status: DC | PRN
  Administered 2015-05-03: 650 mg via ORAL
  Filled 2015-05-03: qty 2

## 2015-05-03 MED ORDER — INSULIN ASPART 100 UNIT/ML ~~LOC~~ SOLN: 0.0000 [IU] | Freq: Every day | SUBCUTANEOUS | Status: DC

## 2015-05-03 MED ORDER — INSULIN ASPART 100 UNIT/ML ~~LOC~~ SOLN: 0.0000 [IU] | Freq: Three times a day (TID) | SUBCUTANEOUS | Status: DC

## 2015-05-03 MED ORDER — GI COCKTAIL ~~LOC~~: 30.0000 mL | Freq: Once | ORAL | Status: DC

## 2015-05-03 MED ORDER — METOPROLOL TARTRATE 25 MG PO TABS
25.0000 mg | ORAL_TABLET | Freq: Four times a day (QID) | ORAL | Status: DC
  Administered 2015-05-03 – 2015-05-04 (×4): 25 mg via ORAL
  Filled 2015-05-03 (×4): qty 1

## 2015-05-03 MED ORDER — SENNOSIDES-DOCUSATE SODIUM 8.6-50 MG PO TABS: 1.0000 | ORAL_TABLET | Freq: Every evening | ORAL | Status: DC | PRN

## 2015-05-03 NOTE — ED Notes (Signed)
Pharmacy to send filtered tubing

## 2015-05-03 NOTE — ED Provider Notes (Signed)
[facility] Emergency Department Provider Note     Time seen: ----------------------------------------- 10:56 AM on 05/03/2015 -----------------------------------------    I have reviewed the triage vital signs and the nursing notes.   HISTORY  Chief Complaint Tachycardia    HPI Russell Ray is a 45 y.o. male who presents to the ER from home with fast heart rate and atrial flutter. Patient was released from [facility] yesterday after having an aortic valve replacement. According to his history he had a bicuspid aortic valve with stenosis which was causing severe symptoms. Patient then underwent valve replacement and is felt much better. Prior to being discharged she did have atrial flutter, states he can feel his heart racing currently. He has had some shortness of breath, he denies fevers or chills.   Past Medical History  Diagnosis Date  . Diabetes mellitus without complication ([place])   . Hypertension   . Anxiety     Patient Active Problem List   Diagnosis Date Noted  . Chest pain, central 04/22/2015  . Morbid obesity ([place]) 03/19/2015  . Leg swelling 03/19/2015  . Orthopnea 03/19/2015  . Tobacco abuse 03/19/2015    Past Surgical History  Procedure Laterality Date  . Hernia repair      X3  . Cardiac catheterization Right 04/22/2015    Procedure: Right/Left Heart Cath and Coronary Angiography;  Surgeon: Dionisio David, MD;  Location: [place] [facility];  Service: Cardiovascular;  Laterality: Right;    Allergies Review of patient's allergies indicates no known allergies.  Social History Social History  Substance Use Topics  . Smoking status: Former Smoker    Types: Cigarettes  . Smokeless tobacco: Not on file  . Alcohol Use: No    Review of Systems Constitutional: Negative for fever. Eyes: Negative for visual changes. ENT: Negative for sore throat. Cardiovascular: Positive for chest soreness and  palpitations Respiratory: Positive shortness of breath Gastrointestinal: Negative for abdominal pain, vomiting and diarrhea. Genitourinary: Negative for dysuria. Musculoskeletal: Negative for back pain. Skin: Negative for rash. Neurological: Negative for headaches, focal weakness or numbness.  10-point ROS otherwise negative.  ____________________________________________   PHYSICAL EXAM:  VITAL SIGNS: ED Triage Vitals  Enc Vitals Group     BP 05/03/15 1054 117/69 mmHg     Pulse Rate 05/03/15 1054 140     Resp 05/03/15 1054 20     Temp 05/03/15 1054 98.8 F (37.1 C)     Temp Source 05/03/15 1054 Oral     SpO2 05/03/15 1054 97 %     Weight 05/03/15 1054 390 lb (176.903 kg)     Height 05/03/15 1054 6' (1.829 m)     Head Cir --      Peak Flow --      Pain Score --      Pain Loc --      Pain Edu? --      Excl. in [place]? --     Constitutional: Alert and oriented. Well appearing and in no distress. Eyes: Conjunctivae are normal. PERRL. Normal extraocular movements. ENT   Head: Normocephalic and atraumatic.   Nose: No congestion/rhinnorhea.   Mouth/Throat: Mucous membranes are moist.   Neck: No stridor. Cardiovascular: Rapid rate, regular rhythm. Normal and symmetric distal pulses are present in all extremities. Artificial heart valve murmur is appreciated Respiratory: Normal respiratory effort without tachypnea nor retractions. Breath sounds are clear and equal bilaterally. No wheezes/rales/rhonchi. Gastrointestinal: Soft and nontender. No distention. No abdominal bruits.  Musculoskeletal: Nontender  with normal range of motion in all extremities. No joint effusions.  No lower extremity tenderness nor edema. Neurologic:  Normal speech and language. No gross focal neurologic deficits are appreciated.  Skin:  Skin is warm, dry and intact. No rash noted. Psychiatric: Mood and affect are normal. Speech and behavior are normal. Patient exhibits appropriate insight and  judgment. ____________________________________________  EKG: Interpreted by me. Atrial flutter with a 2-1 conduction. Rate is 141 bpm, normal QRS, normal QT interval.  ____________________________________________  ED COURSE:  Pertinent labs & imaging results that were available during my care of the patient were reviewed by me and considered in my medical decision making (see chart for details). Patient is in no acute distress, appears to be in atrial flutter however, we'll try amiodarone and reevaluate. ____________________________________________    LABS (pertinent positives/negatives)  Labs Reviewed  CBC - Abnormal; Notable for the following:    WBC 12.2 (*)    RBC 3.65 (*)    Hemoglobin 9.4 (*)    HCT 28.9 (*)    MCV 79.3 (*)    MCH 25.7 (*)    RDW 17.5 (*)    All other components within normal limits  TROPONIN I - Abnormal; Notable for the following:    Troponin I 0.23 (*)    All other components within normal limits  COMPREHENSIVE METABOLIC PANEL - Abnormal; Notable for the following:    Sodium 134 (*)    Chloride 100 (*)    Glucose, Bld 124 (*)    Calcium 8.8 (*)    ALT 160 (*)    All other components within normal limits  PROTIME-INR - Abnormal; Notable for the following:    Prothrombin Time 18.7 (*)    All other components within normal limits  APTT   CRITICAL CARE Performed by: Earleen Newport   Total critical care time: 30 minutes  Critical care time was exclusive of separately billable procedures and treating other patients.  Critical care was necessary to treat or prevent imminent or life-threatening deterioration.  Critical care was time spent personally by me on the following activities: development of treatment plan with patient and/or surrogate as well as nursing, discussions with consultants, evaluation of patient's response to treatment, examination of patient, obtaining history from patient or surrogate, ordering and performing treatments  and interventions, ordering and review of laboratory studies, ordering and review of radiographic studies, pulse oximetry and re-evaluation of patient's condition. RADIOLOGY  Chest x-ray IMPRESSION: Pulmonary edema has resolved. Lungs are under aerated and grossly clear. ____________________________________________  FINAL ASSESSMENT AND PLAN  Atrial flutter  Plan: Patient with labs and imaging as dictated above. Patient was given a bolus of amiodarone 150 mg without significant improvement in his heart rate. I did place on amiodarone drip, he is subtherapeutic on Coumadin and was given Lovenox. I will discuss with the CT surgery team at [facility].   Earleen Newport, MD Patient has been discussed with the CT surgery team, it is felt that he could stay here. I have given him a dose of Cardizem as well for rate control and have discussed with Dr. Chancy Milroy from cardiology.  Earleen Newport, MD 05/03/15 [id number]

## 2015-05-03 NOTE — ED Notes (Signed)
Pt from home; released from [facility] yesterday after valve replacement. Pt is in a-flutter, with hr of 140. Pt alert & oriented with NAD noted. Pain 7/10. Wife at bedside.

## 2015-05-03 NOTE — Progress Notes (Signed)
Advise po amiodrone 400mg  bid after converts to NSR. Also may need metoprolol po to control rate.

## 2015-05-03 NOTE — H&P (Addendum)
[place] [facility] [place] NAME: Russell Ray    MR#:  [id number]  DATE OF BIRTH:  11-12-1970  DATE OF ADMISSION:  05/03/2015  PRIMARY CARE PHYSICIAN: Volanda Napoleon, MD   REQUESTING/REFERRING PHYSICIAN:   CHIEF COMPLAINT:   Chief Complaint  Patient presents with  . Tachycardia    HISTORY OF PRESENT ILLNESS: Russell Ray  is a 45 y.o. male with a known history of cardiac valve replacement due to bicuspid aortic valve which stenosed approximately week ago at [facility] with resultant postoperative atrial fibrillation, converted to sinus rhythm,  patient was discharged home on amiodarone comes back to the hospital with complaints of shortness of breath looks similar to swelling which happened today in the morning. He checked his heart rate and was 140. He presented emergency room for further evaluation and treatment. The patient was given metoprolol as well as Cardizem injections. In emergency room, however, did not improve and his heart rate remained around 140, he was initiated on on amiodarone drip, however, patient remains in atrial fibrillation, RVR at a rate of 140, still he denies any chest pains. Admits of lower extremity swelling, shortness of breath.   PAST MEDICAL HISTORY:   Past Medical History  Diagnosis Date  . Diabetes mellitus without complication ([place])   . Hypertension   . Anxiety     PAST SURGICAL HISTORY: Past Surgical History  Procedure Laterality Date  . Hernia repair      X3  . Cardiac catheterization Right 04/22/2015    Procedure: Right/Left Heart Cath and Coronary Angiography;  Surgeon: Dionisio David, MD;  Location: [place] [facility];  Service: Cardiovascular;  Laterality: Right;  . Valve replacement      SOCIAL HISTORY:  Social History  Substance Use Topics  . Smoking status: Former Smoker    Types: Cigarettes  . Smokeless tobacco: Not on file  . Alcohol Use: No    FAMILY  HISTORY:  Family History  Problem Relation Age of Onset  . Hypertension Mother   . Hypertension Maternal Grandmother   . Diabetes Maternal Grandmother     DRUG ALLERGIES: No Known Allergies  Review of Systems  Constitutional: Negative for fever, chills, weight loss and malaise/fatigue.  HENT: Negative for congestion.   Eyes: Negative for blurred vision and double vision.  Respiratory: Positive for cough and shortness of breath. Negative for sputum production and wheezing.   Cardiovascular: Positive for palpitations and leg swelling. Negative for chest pain, orthopnea and PND.  Gastrointestinal: Negative for nausea, vomiting, abdominal pain, diarrhea, constipation, blood in stool and melena.  Genitourinary: Negative for dysuria, urgency, frequency and hematuria.  Musculoskeletal: Negative for falls.  Skin: Negative for rash.  Neurological: Negative for dizziness and weakness.  Psychiatric/Behavioral: Negative for depression and memory loss. The patient is not nervous/anxious.     MEDICATIONS AT HOME:  Prior to Admission medications   Medication Sig Start Date End Date Taking? Authorizing Provider  acetaminophen (TYLENOL) 325 MG tablet Take 650 mg by mouth every 4 (four) hours as needed for moderate pain.   Yes Historical Provider, MD  amiodarone (PACERONE) 200 MG tablet Take 1 tablet by mouth daily. 05/02/15  Yes Historical Provider, MD  aspirin EC 81 MG tablet Take 1 tablet (81 mg total) by mouth daily. 03/19/15  Yes Theodoro Grist, MD  cyanocobalamin 1000 MCG tablet Take 1,000 mcg by mouth daily.   Yes Historical Provider, MD  cyclobenzaprine (FLEXERIL) 5 MG  tablet Take 5 mg by mouth 3 (three) times daily as needed.   Yes Historical Provider, MD  furosemide (LASIX) 40 MG tablet Take 1 tablet (40 mg total) by mouth daily. Patient taking differently: Take 40 mg by mouth 2 (two) times daily.  03/19/15  Yes Theodoro Grist, MD  KLOR-CON M20 20 MEQ tablet Take 1 tablet by mouth 2 (two) times  daily. 05/02/15  Yes Historical Provider, MD  LORazepam (ATIVAN) 1 MG tablet Take 1 mg by mouth 2 (two) times daily. 03/11/15  Yes Historical Provider, MD  metFORMIN (GLUCOPHAGE) 500 MG tablet Take 500 mg by mouth 2 (two) times daily. 03/08/15  Yes Historical Provider, MD  metoprolol (LOPRESSOR) 50 MG tablet Take 1 tablet by mouth 2 (two) times daily. 05/02/15  Yes Historical Provider, MD  nicotine (NICODERM CQ - DOSED IN MG/24 HOURS) 14 mg/24hr patch Place 14 mg onto the skin daily.   Yes Historical Provider, MD  oxyCODONE (OXY IR/ROXICODONE) 5 MG immediate release tablet Take 1 tablet by mouth every 4 (four) hours as needed. 05/02/15  Yes Historical Provider, MD  polyethylene glycol (MIRALAX / GLYCOLAX) packet Take 17 g by mouth daily as needed.   Yes Historical Provider, MD  senna-docusate (SENOKOT-S) 8.6-50 MG tablet Take 1 tablet by mouth at bedtime as needed for mild constipation. 03/19/15  Yes Theodoro Grist, MD  warfarin (COUMADIN) 2 MG tablet Take 2 tablets by mouth at bedtime. 05/02/15  Yes Historical Provider, MD      PHYSICAL EXAMINATION:   VITAL SIGNS: Blood pressure 131/82, pulse 141, temperature 98.8 F (37.1 C), temperature source Oral, resp. rate 17, height 6' (1.829 m), weight 176.903 kg (390 lb), SpO2 96 %.  GENERAL:  45 y.o.-year-old patient standing by the  bed with no acute distress.  EYES: Pupils equal, round, reactive to light and accommodation. No scleral icterus. Extraocular muscles intact.  HEENT: Head atraumatic, normocephalic. Oropharynx and nasopharynx clear.  NECK:  Supple, no jugular venous distention. No thyroid enlargement, no tenderness.  LUNGS: Normal breath sounds bilaterally, no wheezing, few rhonchi , no  crepitation. No use of accessory muscles of respiration.  Scar was noted in anterior aspect of the chest in the midline crossing sternum , no drainage, erythema, bleeding noted  CARDIOVASCULAR: S1, Sirregular irregular, tachycardic . No murmurs, rubs, or gallops.   ABDOMEN: Soft, nontender, nondistended. Bowel sounds present. No organomegaly or mass.  EXTREMITIES:  3+ lower extremity andal edema,  no cyanosis, or clubbing.  NEUROLOGIC: Cranial nerves II through XII are intact. Muscle strength 5/5 in all extremities. Sensation intact. Gait not checked.  PSYCHIATRIC: The patient is alert and oriented x 3.  SKIN: No obvious rash, lesion, or ulcer.   LABORATORY PANEL:   CBC  Recent Labs Lab 05/03/15 1056  WBC 12.2*  HGB 9.4*  HCT 28.9*  PLT 291  MCV 79.3*  MCH 25.7*  MCHC 32.4  RDW 17.5*   ------------------------------------------------------------------------------------------------------------------  Chemistries   Recent Labs Lab 05/03/15 1056  NA 134*  K 3.9  CL 100*  CO2 24  GLUCOSE 124*  BUN 17  CREATININE 0.62  CALCIUM 8.8*  AST 27  ALT 160*  ALKPHOS 96  BILITOT 0.7   ------------------------------------------------------------------------------------------------------------------  Cardiac Enzymes  Recent Labs Lab 05/03/15 1056  TROPONINI 0.23*   ------------------------------------------------------------------------------------------------------------------  RADIOLOGY: Dg Chest Port 1 View  05/03/2015  CLINICAL DATA:  Atrial fibrillation EXAM: PORTABLE CHEST 1 VIEW COMPARISON:  04/22/2015 FINDINGS: Mild cardiomegaly. Diffuse edema has resolved. Low lung volumes. Lungs are  grossly clear. No pneumothorax. IMPRESSION: Pulmonary edema has resolved. Lungs are under aerated and grossly clear. Electronically Signed   By: Marybelle Killings M.D.   On: 05/03/2015 11:49    EKG: Orders placed or performed during the hospital encounter of 05/03/15  . EKG 12-Lead  . EKG 12-Lead  . ED EKG  . ED EKG   EKG in the emergency room revealed atrial flutter at a rate of 141 bpm to 21 AV conduction, nonspecific ST-T changes  IMPRESSION AND PLAN:  Active Problems:   A-fib (HCC)   Atrial fibrillation with RVR ([place]) #1. A. fib, RVR,  admit patient to intensive care unit stepdown continue amiodarone, patient will need to be loaded with 8-9 g of amiodarone intravenously and orally, then continue maintenance therapy, he had a few episodes of atrial fibrillation, converted to sinus rhythm on amiodarone in the past, continue Coumadin, INR is 1.56, initiate patient on heparin IV drip #2. Hyponatremia in fluid overloaded patient, continue patient on Lasix intravenously him a follow sodium level in the morning. #3. Hyperglycemia, with most recent hemoglobin A1c 6.1, according to the patient, no diabetes, continue sliding scale, metformin was which was initiated to improve tissue sensitivity to insulin   #4. Obstructive sleep apnea, clinically, initiate patient on auto CPAP. #5. Anxiety, continue Ativan, patient wanted to advance dose to 3 times daily. #6. Elevated troponin, likely due to A. fib, RVR, getting cardiologist involved for further recommendations. #7 leukocytosis, unclear etiology, likely stress, following the morning. No antibiotic at present. #8 anemia, follow with diuretics, get a Hemoccult #9 . Tobacco abuse, continue nicotine replacement therapy, discussed with patient approximately 3-4 minutes . She admits of quitting the proximal medicine weeks ago, congratulated  All the records are reviewed and case discussed with ED provider. Management plans discussed with the patient, family and they are in agreement.  CODE STATUS: Code Status History    Date Active Date Inactive Code Status Order ID Comments User Context   04/22/2015  7:37 AM 04/22/2015  9:30 PM Full Code [id number]  Lytle Butte, MD ED   03/18/2015  2:14 PM 03/19/2015  3:15 PM Full Code [id number]  Fritzi Mandes, MD Inpatient       TOTAL TIME TAKING CARE OF THIS PATIENT: 60 minutes.  Discussed with patient's wife   Keriann Rankin M.D on 05/03/2015 at 2:35 PM  Between 7am to 6pm - Pager - [phone] After 6pm go to [email or web address] - password [email or web address] [facility]  Office  [phone]  CC: Primary care physician; Volanda Napoleon, MD

## 2015-05-03 NOTE — Progress Notes (Signed)
ANTICOAGULATION CONSULT NOTE - Initial Consult  Pharmacy Consult for wafarin  Indication: atrial fibrillation  No Known Allergies  Patient Measurements: Height: 6' (182.9 cm) Weight: (!) 390 lb (176.903 kg) IBW/kg (Calculated) : 77.6 Heparin Dosing Weight:   Vital Signs: Temp: 98.8 F (37.1 C) (03/07 1054) Temp Source: Oral (03/07 1054) BP: 120/69 mmHg (03/07 1746) Pulse Rate: 143 (03/07 1746)  Labs:  Recent Labs  05/03/15 1056 05/03/15 1611  HGB 9.4*  --   HCT 28.9*  --   PLT 291  --   APTT 33  --   LABPROT 18.7*  --   INR 1.56  --   CREATININE 0.62  --   TROPONINI 0.23* 0.19*    Estimated Creatinine Clearance: 197.5 mL/min (by C-G formula based on Cr of 0.62).   Medical History: Past Medical History  Diagnosis Date  . Diabetes mellitus without complication ([place])   . Hypertension   . Anxiety     Medications:   (Not in a hospital admission) Scheduled:  . aspirin EC  81 mg Oral Daily  . docusate sodium  100 mg Oral BID  . famotidine  20 mg Oral BID  . furosemide  40 mg Intravenous Q12H  . insulin aspart  0-5 Units Subcutaneous QHS  . [START ON 05/04/2015] insulin aspart  0-9 Units Subcutaneous TID WC  . [START ON 05/04/2015] insulin aspart  3 Units Subcutaneous TID WC  . metFORMIN  500 mg Oral BID  . metoprolol  25 mg Oral Q6H  . nicotine  14 mg Transdermal Daily  . potassium chloride  20 mEq Oral BID  . sodium chloride flush  3 mL Intravenous Q12H  . sodium chloride flush  3 mL Intravenous Q12H  . cyanocobalamin  1,000 mcg Oral Daily  . warfarin  4 mg Oral QHS    Assessment: Pharmacy consulted to dose and monitor warfarin therapy in this 45 year old male being treated for atrial fibrillation.   Home dose: 2 mg PO daily  3/7: INR: 1.56;      Goal of Therapy:  INR 2-3 Monitor platelets by anticoagulation protocol: Yes   Plan:  Will give warfarin 3 mg PO x 1 (50% increase in home dose). Will reorder PT/INR with am labs.Pharmacy to follow.      Press Casale D 05/03/2015,5:50 PM

## 2015-05-03 NOTE — ED Notes (Signed)
Dr. Clayton Bibles in room at this time.

## 2015-05-03 NOTE — Progress Notes (Signed)
ANTICOAGULATION CONSULT NOTE - Initial Consult  Pharmacy Consult for Heparin  Indication: atrial fibrillation  No Known Allergies  Patient Measurements: Height: 6' (182.9 cm) Weight: (!) 390 lb (176.903 kg) IBW/kg (Calculated) : 77.6 Heparin Dosing Weight: 121 kg   Vital Signs: Temp: 98.8 F (37.1 C) (03/07 1054) Temp Source: Oral (03/07 1054) BP: 109/68 mmHg (03/07 1500) Pulse Rate: 141 (03/07 1500)  Labs:  Recent Labs  05/03/15 1056  HGB 9.4*  HCT 28.9*  PLT 291  APTT 33  LABPROT 18.7*  INR 1.56  CREATININE 0.62  TROPONINI 0.23*    Estimated Creatinine Clearance: 197.5 mL/min (by C-G formula based on Cr of 0.62).   Medical History: Past Medical History  Diagnosis Date  . Diabetes mellitus without complication ([place])   . Hypertension   . Anxiety     Medications:   (Not in a hospital admission)  Assessment: Pharmacy consulted to dose heparin in this 45 year old male admitted with A fib.  Pt was started on warfarin on 3/06 but INR on 3/07 was subtherapeutic (1.56).    Patient received Enoxaparin 175 mg SQ x 1 @ ~12:00 on 3/7; therefore will not bolus patient with heparin.   Goal of Therapy:  Heparin level 0.3-0.7 units/ml Monitor platelets by anticoagulation protocol: Yes   Plan:  Will start heparin gtt 1800 units/hr  12 hours  After the enoxaparin dose. Heparin gtt scheduled to start on 3/8 @ 01:00. Heparin level will be ordered for 07:00 on 3/8. Pharmacy to follow.   Royetta Probus D 05/03/2015,3:56 PM

## 2015-05-03 NOTE — ED Notes (Signed)
Pt from home with tachycardia and aflutter. Pt released from [place] yesterday after valve replacement. Pt alert & oriented, states he feels like his heart is racing.

## 2015-05-03 NOTE — Progress Notes (Deleted)
ANTICOAGULATION CONSULT NOTE - Initial Consult  Pharmacy Consult for Heparin  Indication: atrial fibrillation  No Known Allergies  Patient Measurements: Height: 6' (182.9 cm) Weight: (!) 390 lb (176.903 kg) IBW/kg (Calculated) : 77.6 Heparin Dosing Weight: 121 kg   Vital Signs: Temp: 98.8 F (37.1 C) (03/07 1054) Temp Source: Oral (03/07 1054) BP: 131/82 mmHg (03/07 1330) Pulse Rate: 141 (03/07 1330)  Labs:  Recent Labs  05/03/15 1056  HGB 9.4*  HCT 28.9*  PLT 291  APTT 33  LABPROT 18.7*  INR 1.56  CREATININE 0.62  TROPONINI 0.23*    Estimated Creatinine Clearance: 197.5 mL/min (by C-G formula based on Cr of 0.62).   Medical History: Past Medical History  Diagnosis Date  . Diabetes mellitus without complication ([place])   . Hypertension   . Anxiety     Medications:   (Not in a hospital admission)  Assessment: Pharmacy consulted to dose heparin in this 45 year old male admitted with A fib.  Pt was started on warfarin on 3/06 but INR on 3/07 was subtherapeutic (1.56).   Will go ahead and bolus this pt.   Goal of Therapy:  Heparin level 0.3-0.7 units/ml Monitor platelets by anticoagulation protocol: Yes   Plan:  Give 7250 units bolus x 1 Start heparin infusion at 1800 units/hr  Will order 1st HL 6 hrs after start of drip on 3/07 @   Anglea Gordner D 05/03/2015,2:53 PM

## 2015-05-04 ENCOUNTER — Inpatient Hospital Stay
Admit: 2015-05-04 | Discharge: 2015-05-04 | Disposition: A | Discharge status: Home / Self Care | Payer: 59 | Attending: Cardiology | Admitting: Cardiology

## 2015-05-04 LAB — TROPONIN I
TROPONIN I: 0.38 ng/mL — AB (ref <0.031)
Troponin I: 0.41 ng/mL — ABNORMAL HIGH (ref <0.031)

## 2015-05-04 LAB — GLUCOSE, CAPILLARY
GLUCOSE-CAPILLARY: 116 mg/dL — AB (ref 65–99)
GLUCOSE-CAPILLARY: 121 mg/dL — AB (ref 65–99)
Glucose-Capillary: 109 mg/dL — ABNORMAL HIGH (ref 65–99)
Glucose-Capillary: 139 mg/dL — ABNORMAL HIGH (ref 65–99)

## 2015-05-04 LAB — CBC
HEMATOCRIT: 27.8 % — AB (ref 40.0–52.0)
Hemoglobin: 9.2 g/dL — ABNORMAL LOW (ref 13.0–18.0)
MCH: 25.8 pg — AB (ref 26.0–34.0)
MCHC: 33.1 g/dL (ref 32.0–36.0)
MCV: 77.8 fL — AB (ref 80.0–100.0)
Platelets: 251 10*3/uL (ref 150–440)
RBC: 3.57 MIL/uL — ABNORMAL LOW (ref 4.40–5.90)
RDW: 17.8 % — AB (ref 11.5–14.5)
WBC: 10.8 10*3/uL — ABNORMAL HIGH (ref 3.8–10.6)

## 2015-05-04 LAB — BASIC METABOLIC PANEL
Anion gap: 12 (ref 5–15)
BUN: 14 mg/dL (ref 6–20)
CO2: 23 mmol/L (ref 22–32)
Calcium: 8.8 mg/dL — ABNORMAL LOW (ref 8.9–10.3)
Chloride: 100 mmol/L — ABNORMAL LOW (ref 101–111)
Creatinine, Ser: 0.76 mg/dL (ref 0.61–1.24)
GFR calc non Af Amer: >60 mL/min (ref >60)
GLUCOSE: 105 mg/dL — AB (ref 65–99)
Potassium: 4 mmol/L (ref 3.5–5.1)
SODIUM: 135 mmol/L (ref 135–145)

## 2015-05-04 LAB — APTT: aPTT: 35 seconds (ref 24–36)

## 2015-05-04 LAB — PROTIME-INR
INR: 1.69
Prothrombin Time: 19.9 seconds — ABNORMAL HIGH (ref 11.4–15.0)

## 2015-05-04 LAB — HEMOGLOBIN A1C: Hgb A1c MFr Bld: 6.2 % — ABNORMAL HIGH (ref 4.0–6.0)

## 2015-05-04 LAB — HEPARIN LEVEL (UNFRACTIONATED): Heparin Unfractionated: <0.1 IU/mL — ABNORMAL LOW (ref 0.30–0.70)

## 2015-05-04 MED ORDER — METOPROLOL TARTRATE 50 MG PO TABS: 50.0000 mg | ORAL_TABLET | Freq: Two times a day (BID) | ORAL | Status: DC

## 2015-05-04 MED ORDER — SODIUM CHLORIDE 0.9 % IV SOLN: 250.0000 mL | INTRAVENOUS | Status: DC

## 2015-05-04 MED ORDER — SODIUM CHLORIDE 0.9% FLUSH
3.0000 mL | Freq: Two times a day (BID) | INTRAVENOUS | Status: DC
  Administered 2015-05-05: 3 mL via INTRAVENOUS

## 2015-05-04 MED ORDER — WARFARIN SODIUM 1 MG PO TABS
3.0000 mg | ORAL_TABLET | Freq: Once | ORAL | Status: AC
  Administered 2015-05-04: 3 mg via ORAL
  Filled 2015-05-04: qty 3

## 2015-05-04 MED ORDER — AMIODARONE HCL IN DEXTROSE 360-4.14 MG/200ML-% IV SOLN
30.0000 mg/h | INTRAVENOUS | Status: DC
  Administered 2015-05-04: 30 mg/h via INTRAVENOUS
  Filled 2015-05-04 (×5): qty 200

## 2015-05-04 MED ORDER — METOPROLOL TARTRATE 25 MG PO TABS
25.0000 mg | ORAL_TABLET | Freq: Once | ORAL | Status: AC
  Administered 2015-05-04: 25 mg via ORAL
  Filled 2015-05-04: qty 1

## 2015-05-04 MED ORDER — SODIUM CHLORIDE 0.9% FLUSH
3.0000 mL | INTRAVENOUS | Status: DC | PRN
Start: 2015-05-04 — End: 2015-05-05

## 2015-05-04 MED ORDER — SODIUM CHLORIDE 0.9 % IV SOLN: INTRAVENOUS | Status: DC

## 2015-05-04 MED ORDER — ACETAMINOPHEN 325 MG PO TABS: 650.0000 mg | ORAL_TABLET | ORAL | Status: DC | PRN

## 2015-05-04 MED ORDER — METOPROLOL TARTRATE 50 MG PO TABS
50.0000 mg | ORAL_TABLET | Freq: Four times a day (QID) | ORAL | Status: DC
  Administered 2015-05-04 – 2015-05-05 (×5): 50 mg via ORAL
  Filled 2015-05-04 (×5): qty 1

## 2015-05-04 MED ORDER — HEPARIN (PORCINE) IN NACL 100-0.45 UNIT/ML-% IJ SOLN
1850.0000 [IU]/h | INTRAMUSCULAR | Status: DC
Start: 2015-05-04 — End: 2015-05-04
  Administered 2015-05-04: 1850 [IU]/h via INTRAVENOUS
  Filled 2015-05-04 (×3): qty 250

## 2015-05-04 MED ORDER — ENOXAPARIN SODIUM 150 MG/ML ~~LOC~~ SOLN
175.0000 mg | Freq: Two times a day (BID) | SUBCUTANEOUS | Status: DC
  Administered 2015-05-05 (×2): 175 mg via SUBCUTANEOUS
  Filled 2015-05-04 (×3): qty 1.17

## 2015-05-04 MED ORDER — ONDANSETRON HCL 4 MG/2ML IJ SOLN: 4.0000 mg | Freq: Four times a day (QID) | INTRAMUSCULAR | Status: DC | PRN

## 2015-05-04 MED ORDER — AMIODARONE LOAD VIA INFUSION
150.0000 mg | Freq: Once | INTRAVENOUS | Status: AC
  Administered 2015-05-04: 150 mg via INTRAVENOUS

## 2015-05-04 MED ORDER — AMIODARONE HCL IN DEXTROSE 360-4.14 MG/200ML-% IV SOLN
30.0000 mg/h | Freq: Once | INTRAVENOUS | Status: AC
  Administered 2015-05-04: 16.67 mL/h via INTRAVENOUS
  Filled 2015-05-04 (×2): qty 200

## 2015-05-04 NOTE — Consult Note (Signed)
ANTICOAGULATION CONSULT NOTE - Initial Consult  Pharmacy Consult for Heparin Indication: atrial fibrillation  No Known Allergies  Patient Measurements: Height: 6' (182.9 cm) Weight: (!) 390 lb (176.903 kg) IBW/kg (Calculated) : 77.6 Heparin Dosing Weight: 121 kg  Vital Signs: Temp: 98.6 F (37 C) (03/08 1944) Temp Source: Oral (03/08 1944) BP: 123/95 mmHg (03/08 1944) Pulse Rate: 147 (03/08 1742)  Labs:  Recent Labs  05/03/15 1056  05/03/15 2025 05/04/15 0202 05/04/15 0710 05/04/15 0711 05/04/15 1028  HGB 9.4*  --   --   --  9.2*  --   --   HCT 28.9*  --   --   --  27.8*  --   --   PLT 291  --   --   --  251  --   --   APTT 33  --   --   --   --   --  35  LABPROT 18.7*  --   --   --  19.9*  --   --   INR 1.56  --   --   --  1.69  --   --   HEPARINUNFRC  --   --   --   --   --   --  <0.10*  CREATININE 0.62  --   --   --  0.76  --   --   TROPONINI 0.23*  < > 0.27* 0.41*  --  0.38*  --   < > = values in this interval not displayed.  Estimated Creatinine Clearance: 197.5 mL/min (by C-G formula based on Cr of 0.76).   Medical History: Past Medical History  Diagnosis Date  . Diabetes mellitus without complication ([place])   . Hypertension   . Anxiety     Medications:  Scheduled:  . aspirin EC  81 mg Oral Daily  . docusate sodium  100 mg Oral BID  . enoxaparin (LOVENOX) injection  175 mg Subcutaneous BID  . famotidine  20 mg Oral BID  . furosemide  40 mg Intravenous Q12H  . insulin aspart  0-5 Units Subcutaneous QHS  . insulin aspart  0-9 Units Subcutaneous TID WC  . insulin aspart  3 Units Subcutaneous TID WC  . metFORMIN  500 mg Oral BID  . metoprolol tartrate  50 mg Oral 4 times per day  . nicotine  14 mg Transdermal Daily  . potassium chloride  20 mEq Oral BID  . sodium chloride flush  3 mL Intravenous Q12H  . sodium chloride flush  3 mL Intravenous Q12H  . sodium chloride flush  3 mL Intravenous Q12H  . cyanocobalamin  1,000 mcg Oral Daily  . Warfarin  - Pharmacist Dosing Inpatient   Does not apply q1800   Infusions:  . sodium chloride    . [START ON 05/05/2015] sodium chloride    . amiodarone 30 mg/hr (05/04/15 2000)    Assessment: Russell Ray is a 45yo male admitted for chest pain 2/2 recent aortic valve replacement and afib with subtherapeutic INR. Patient is Ray/p enoxaparin 175mg  SQ x1 @ noon on 3/7. On warfarin 2mg  PO daily PTA. INR this am 1.69.   Goal of Therapy:  Heparin level 0.3-0.7 units/ml Monitor platelets by anticoagulation protocol: Yes   Plan:  Due to prior enoxaparin administration and warfarin administration, will not bolus with heparin at this time.  Start heparin infusion at 1850 units/hr Check anti-Xa level in 8 hours and daily while on heparin Continue to monitor H&H and platelets  Heparin level will be ordered for 2000 on 3/8. Ordered baseline HL, aPTT prior to heparin gtt initiation.  3/8 PM patient refusing heparin monitoring draws. Changing to Lovenox 1 mg/kg bid per consult.   Russell Ray 05/04/2015,11:11 PM

## 2015-05-04 NOTE — Progress Notes (Signed)
Patient has remained alert and oriented throughout shift.  Monitor shows patient in A Flutter (HR 140's) most of the night with a recent conversion around 0400 to A Fib (HR 100's-120's).  All other vital signs remain stable as well.  Patient has complained of some pain from surgical site but has been well managed on PRN meds.  Will continue to monitor.

## 2015-05-04 NOTE — Progress Notes (Signed)
[facility] [place] NAME: Russell Ray    MR#:  [id number]  DATE OF BIRTH:  Sep 25, 1970  SUBJECTIVE:  CHIEF COMPLAINT:   Chief Complaint  Patient presents with  . Tachycardia   - patient is s/p aortic valve replacement surgery at [facility] last week and admitted yesterday for new onset atrial flutter- still HR in 140's now - Improving chest pain and also palpitations - on amiodarone drip. - plan for possible cardioversion tomorrow - anticoagulated with heparin drip for now.  REVIEW OF SYSTEMS:  Review of Systems  Constitutional: Negative for fever and chills.  HENT: Negative for ear discharge, ear pain and nosebleeds.   Eyes: Negative for blurred vision and double vision.  Respiratory: Negative for cough, shortness of breath and wheezing.   Cardiovascular: Positive for chest pain, palpitations and leg swelling.  Gastrointestinal: Negative for nausea, vomiting, abdominal pain, diarrhea and constipation.  Genitourinary: Negative for dysuria.       Scrotal swelling  Musculoskeletal:       Left arm pain from IV infiltration  Neurological: Negative for dizziness, sensory change, speech change, focal weakness, seizures, weakness and headaches.  Psychiatric/Behavioral: Negative for depression. The patient is nervous/anxious.     DRUG ALLERGIES:  No Known Allergies  VITALS:  Blood pressure 132/93, pulse 147, temperature 98.8 F (37.1 C), temperature source Oral, resp. rate 26, height 6' (1.829 m), weight 176.903 kg (390 lb), SpO2 89 %.  PHYSICAL EXAMINATION:  Physical Exam  GENERAL:  45 y.o.-year-old obese patient sitting in a chair with no acute distress.  EYES: Pupils equal, round, reactive to light and accommodation. No scleral icterus. Extraocular muscles intact.  HEENT: Head atraumatic, normocephalic. Oropharynx and nasopharynx clear.  NECK:  Supple, no jugular venous distention. No thyroid enlargement, no tenderness.  LUNGS:  Normal breath sounds bilaterally, no wheezing, rales,rhonchi or crepitation. No use of accessory muscles of respiration. Decreased bibasilar breath sounds. CARDIOVASCULAR: S1, S2 normal. Rapid rate, regular rhythm. Mid sternal incision with staples noted- clean appearing. No murmurs, rubs, or gallops.  ABDOMEN: Soft, nontender, nondistended. Bowel sounds present. No organomegaly or mass.  EXTREMITIES: No cyanosis, or clubbing. 1+ pedal edema present. NEUROLOGIC: Cranial nerves II through XII are intact. Muscle strength 5/5 in all extremities. Sensation intact. Gait not checked.  PSYCHIATRIC: The patient is alert and oriented x 3.  SKIN: No obvious rash, lesion, or ulcer.    LABORATORY PANEL:   CBC  Recent Labs Lab 05/04/15 0710  WBC 10.8*  HGB 9.2*  HCT 27.8*  PLT 251   ------------------------------------------------------------------------------------------------------------------  Chemistries   Recent Labs Lab 05/03/15 1056 05/04/15 0710  NA 134* 135  K 3.9 4.0  CL 100* 100*  CO2 24 23  GLUCOSE 124* 105*  BUN 17 14  CREATININE 0.62 0.76  CALCIUM 8.8* 8.8*  AST 27  --   ALT 160*  --   ALKPHOS 96  --   BILITOT 0.7  --    ------------------------------------------------------------------------------------------------------------------  Cardiac Enzymes  Recent Labs Lab 05/04/15 0711  TROPONINI 0.38*   ------------------------------------------------------------------------------------------------------------------  RADIOLOGY:  Dg Chest Port 1 View  05/03/2015  CLINICAL DATA:  Atrial fibrillation EXAM: PORTABLE CHEST 1 VIEW COMPARISON:  04/22/2015 FINDINGS: Mild cardiomegaly. Diffuse edema has resolved. Low lung volumes. Lungs are grossly clear. No pneumothorax. IMPRESSION: Pulmonary edema has resolved. Lungs are under aerated and grossly clear. Electronically Signed   By: Marybelle Killings M.D.   On: 05/03/2015 11:49    EKG:  Orders placed or performed during the  hospital encounter of 05/03/15  . EKG 12-Lead  . EKG 12-Lead  . ED EKG  . ED EKG  . EKG 12-lead  . EKG 12-Lead pre-cardioversion  . EKG 12-Lead    ASSESSMENT AND PLAN:   44y/oM with PMH of HTN, NIDDM, bicuspid aortic valve s/p replacement done last week admitted for Afib with Rvr  Active Problems:  A-fib (HCC)  Atrial fibrillation with RVR ([place])  #1. A. fib, RVR: No prior history- started at [place] last week, was in NSR at discharge and then converted again. - continue amiodarone drip- likely flutter. - HR still at 140's -possible cardioversion tomorrow - also on metoprolol for rate control - cardiology consult appreciated, f/u ECHO -on IV heparin drip for now and coumadin  #2. Hyponatremia in fluid overloaded patient, continue patient on Lasix intravenously  And  follow sodium level   #3. Hyperglycemia, with most recent hemoglobin A1c 6.1, according to the patient, Borderline diabetes. continue sliding scale, metformin - which was initiated to improve tissue sensitivity to insulin   #4. Obstructive sleep apnea, , initiate patient on auto CPAP.  #5. Anxiety, continue Ativan  #6. Elevated troponin, likely due to A. fib, RVR, getting cardiologist involved for further recommendations. Not acute MI, just had a cardiac cath prior to valve replacement surgery and normal coronaries. Already on heparin drip anyway  #7 . Tobacco abuse, continue nicotine replacement therapy    All the records are reviewed and case discussed with Care Management/Social Workerr. Management plans discussed with the patient, family and they are in agreement.  CODE STATUS: Full Code  TOTAL CRITICAL CARE TIME SPENT IN TAKING CARE OF THIS PATIENT: 38 minutes.   POSSIBLE D/C IN 1-2 DAYS, DEPENDING ON CLINICAL CONDITION.   Gladstone Lighter M.D on 05/04/2015 at 6:42 PM  Between 7am to 6pm - Pager - [phone]  After 6pm go to [email or web address] - password [email or web address]   Office  [phone]  CC: Primary care physician; Volanda Napoleon, MD

## 2015-05-04 NOTE — Progress Notes (Signed)
Patient has been refusing lab draws for Heparin level from both lab workers working [employer].  This nurse attempted twice to draw Heparin level but was unsuccessful.  Patient became increasingly agitated.  Spoke with Dr. Jannifer Franklin regarding situation.  Dr. Jannifer Franklin ordered to anticoagulate with Lovenox for now and d/c the Heparin drip to avoid further lab draws.  Will continue to monitor.

## 2015-05-04 NOTE — Progress Notes (Signed)
Patient's assessments remain unchanged from this a.m, Pecolia Ades NP has made rounds and updated, pt still has complaints of chest soreness, HR in the 140s, BP wnl. Spouse at bedside has been updated. Patient has been ambulatory to bathroom. Amiodarone and heparin drips continue as charted. Pain meds given with improvement. No ss of distress noted.

## 2015-05-04 NOTE — Consult Note (Addendum)
ANTICOAGULATION CONSULT NOTE - Initial Consult  Pharmacy Consult for Heparin Indication: atrial fibrillation  No Known Allergies  Patient Measurements: Height: 6' (182.9 cm) Weight: (!) 390 lb (176.903 kg) IBW/kg (Calculated) : 77.6 Heparin Dosing Weight: 121 kg  Vital Signs: Temp: 98.6 F (37 C) (03/08 0700) Temp Source: Oral (03/08 0700) BP: 126/98 mmHg (03/08 0800) Pulse Rate: 147 (03/08 0800)  Labs:  Recent Labs  05/03/15 1056  05/03/15 2025 05/04/15 0202 05/04/15 0710 05/04/15 0711  HGB 9.4*  --   --   --  9.2*  --   HCT 28.9*  --   --   --  27.8*  --   PLT 291  --   --   --  251  --   APTT 33  --   --   --   --   --   LABPROT 18.7*  --   --   --  19.9*  --   INR 1.56  --   --   --  1.69  --   CREATININE 0.62  --   --   --  0.76  --   TROPONINI 0.23*  < > 0.27* 0.41*  --  0.38*  < > = values in this interval not displayed.  Estimated Creatinine Clearance: 197.5 mL/min (by C-G formula based on Cr of 0.76).   Medical History: Past Medical History  Diagnosis Date  . Diabetes mellitus without complication ([place])   . Hypertension   . Anxiety     Medications:  Scheduled:  . aspirin EC  81 mg Oral Daily  . docusate sodium  100 mg Oral BID  . famotidine  20 mg Oral BID  . furosemide  40 mg Intravenous Q12H  . insulin aspart  0-5 Units Subcutaneous QHS  . insulin aspart  0-9 Units Subcutaneous TID WC  . insulin aspart  3 Units Subcutaneous TID WC  . metFORMIN  500 mg Oral BID  . metoprolol tartrate  50 mg Oral 4 times per day  . nicotine  14 mg Transdermal Daily  . potassium chloride  20 mEq Oral BID  . sodium chloride flush  3 mL Intravenous Q12H  . sodium chloride flush  3 mL Intravenous Q12H  . cyanocobalamin  1,000 mcg Oral Daily  . warfarin  3 mg Oral ONCE-1800  . Warfarin - Pharmacist Dosing Inpatient   Does not apply q1800   Infusions:  . heparin      Assessment: Russell Ray is a 45yo male admitted for chest pain 2/2 recent aortic valve replacement  and afib with subtherapeutic INR. Patient is s/p enoxaparin 175mg  SQ x1 @ noon on 3/7. On warfarin 2mg  PO daily PTA. INR this am 1.69.   Goal of Therapy:  Heparin level 0.3-0.7 units/ml Monitor platelets by anticoagulation protocol: Yes   Plan:  Due to prior enoxaparin administration and warfarin administration, will not bolus with heparin at this time.  Start heparin infusion at 1850 units/hr Check anti-Xa level in 8 hours and daily while on heparin Continue to monitor H&H and platelets   Heparin level will be ordered for 2000 on 3/8. Ordered baseline HL, aPTT prior to heparin gtt initiation.  Vena Rua 05/04/2015,9:55 AM

## 2015-05-04 NOTE — Progress Notes (Signed)
ANTICOAGULATION CONSULT NOTE - Follow Up  Pharmacy Consult for wafarin  Indication: atrial fibrillation  No Known Allergies  Patient Measurements: Height: 6' (182.9 cm) Weight: (!) 390 lb (176.903 kg) IBW/kg (Calculated) : 77.6 Heparin Dosing Weight:   Vital Signs: Temp: 98.6 F (37 C) (03/08 0700) Temp Source: Oral (03/08 0700) BP: 126/98 mmHg (03/08 0800) Pulse Rate: 147 (03/08 0800)  Labs:  Recent Labs  05/03/15 1056  05/03/15 2025 05/04/15 0202 05/04/15 0710 05/04/15 0711  HGB 9.4*  --   --   --  9.2*  --   HCT 28.9*  --   --   --  27.8*  --   PLT 291  --   --   --  251  --   APTT 33  --   --   --   --   --   LABPROT 18.7*  --   --   --  19.9*  --   INR 1.56  --   --   --  1.69  --   CREATININE 0.62  --   --   --  0.76  --   TROPONINI 0.23*  < > 0.27* 0.41*  --  0.38*  < > = values in this interval not displayed.  Estimated Creatinine Clearance: 197.5 mL/min (by C-G formula based on Cr of 0.76).   Medical History: Past Medical History  Diagnosis Date  . Diabetes mellitus without complication ([place])   . Hypertension   . Anxiety     Medications:  Prescriptions prior to admission  Medication Sig Dispense Refill Last Dose  . acetaminophen (TYLENOL) 325 MG tablet Take 650 mg by mouth every 4 (four) hours as needed for moderate pain.   PRN at PRN  . amiodarone (PACERONE) 200 MG tablet Take 1 tablet by mouth daily.   05/03/2015 at 0830  . aspirin EC 81 MG tablet Take 1 tablet (81 mg total) by mouth daily. 30 tablet 0 05/03/2015 at Unknown time  . cyanocobalamin 1000 MCG tablet Take 1,000 mcg by mouth daily.   Past Week at Unknown time  . cyclobenzaprine (FLEXERIL) 5 MG tablet Take 5 mg by mouth 3 (three) times daily as needed.   PRN at PRN  . furosemide (LASIX) 40 MG tablet Take 1 tablet (40 mg total) by mouth daily. (Patient taking differently: Take 40 mg by mouth 2 (two) times daily. ) 30 tablet 6 05/03/2015 at 0830  . KLOR-CON M20 20 MEQ tablet Take 1 tablet by  mouth 2 (two) times daily.   05/03/2015 at Unknown time  . LORazepam (ATIVAN) 1 MG tablet Take 1 mg by mouth 2 (two) times daily.  2 05/03/2015 at Unknown time  . metFORMIN (GLUCOPHAGE) 500 MG tablet Take 500 mg by mouth 2 (two) times daily.  0 05/03/2015 at 0830  . metoprolol (LOPRESSOR) 50 MG tablet Take 1 tablet by mouth 2 (two) times daily.   05/03/2015 at 0830  . nicotine (NICODERM CQ - DOSED IN MG/24 HOURS) 14 mg/24hr patch Place 14 mg onto the skin daily.   05/03/2015 at 0630  . oxyCODONE (OXY IR/ROXICODONE) 5 MG immediate release tablet Take 1 tablet by mouth every 4 (four) hours as needed.   PRN at PRN  . polyethylene glycol (MIRALAX / GLYCOLAX) packet Take 17 g by mouth daily as needed.   PRN at PRN  . senna-docusate (SENOKOT-S) 8.6-50 MG tablet Take 1 tablet by mouth at bedtime as needed for mild constipation. 30 tablet 6 PRN  at PRN  . warfarin (COUMADIN) 2 MG tablet Take 2 tablets by mouth at bedtime.   05/02/2015 at Unknown time   Scheduled:  . aspirin EC  81 mg Oral Daily  . docusate sodium  100 mg Oral BID  . famotidine  20 mg Oral BID  . furosemide  40 mg Intravenous Q12H  . insulin aspart  0-5 Units Subcutaneous QHS  . insulin aspart  0-9 Units Subcutaneous TID WC  . insulin aspart  3 Units Subcutaneous TID WC  . metFORMIN  500 mg Oral BID  . metoprolol tartrate  50 mg Oral BID  . nicotine  14 mg Transdermal Daily  . potassium chloride  20 mEq Oral BID  . sodium chloride flush  3 mL Intravenous Q12H  . sodium chloride flush  3 mL Intravenous Q12H  . cyanocobalamin  1,000 mcg Oral Daily  . Warfarin - Pharmacist Dosing Inpatient   Does not apply q1800    Assessment: Pharmacy consulted to dose and monitor warfarin therapy in this 45 year old male being treated for mechanical aortic valve replacement and atrial fibrillation.   Of note, patient is also on heparin gtt.  Home dose: 2 mg PO daily  3/7: INR 1.56; warfarin 3mg   3/8: INR 1.69;    Goal of Therapy:  INR 2-3 Monitor  platelets by anticoagulation protocol: Yes   Plan:  Will give another dose of warfarin 3 mg PO (50% increase in home dose), as INR is still subtherapeutic.   Will reorder PT/INR with am labs. Pharmacy to follow.     Vena Rua 05/04/2015,8:33 AM

## 2015-05-04 NOTE — Progress Notes (Signed)
*  PRELIMINARY RESULTS* Echocardiogram 2D Echocardiogram has been performed.  Russell Ray 05/04/2015, 2:34 PM

## 2015-05-04 NOTE — Consult Note (Signed)
Russell Ray is a 45 y.o. male  [id number]  Primary Cardiologist:  Neoma Laming Reason for Consultation: atrial fibrillation  HPI: Russell Ray is well known to our cardiology service. He recently underwent aortic valve replacement at [facility]. He developed post-op afib and was started on amiodarone. He was in sinus rhythm prior to discharge yesterday. A few hours after being home he began to feel bad and noted a very fast heart rate. He called [facility] and was directed to go to his local ED. Amiodarone drip was started and he has been given beta blocker. This morning his heart rate was still in the 140's. The patient is no short of breath at rest in the bed. He had a cath pre- valve surgery and was found to have normal coronary arteries.    Review of Systems:  Cardiovascular ROS: positive for - dyspnea on exertion, edema and irregular heartbeat   Past Medical History  Diagnosis Date  . Diabetes mellitus without complication ([place])   . Hypertension   . Anxiety     Medications Prior to Admission  Medication Sig Dispense Refill  . acetaminophen (TYLENOL) 325 MG tablet Take 650 mg by mouth every 4 (four) hours as needed for moderate pain.    Marland Kitchen amiodarone (PACERONE) 200 MG tablet Take 1 tablet by mouth daily.    Marland Kitchen aspirin EC 81 MG tablet Take 1 tablet (81 mg total) by mouth daily. 30 tablet 0  . cyanocobalamin 1000 MCG tablet Take 1,000 mcg by mouth daily.    . cyclobenzaprine (FLEXERIL) 5 MG tablet Take 5 mg by mouth 3 (three) times daily as needed.    . furosemide (LASIX) 40 MG tablet Take 1 tablet (40 mg total) by mouth daily. (Patient taking differently: Take 40 mg by mouth 2 (two) times daily. ) 30 tablet 6  . KLOR-CON M20 20 MEQ tablet Take 1 tablet by mouth 2 (two) times daily.    Marland Kitchen LORazepam (ATIVAN) 1 MG tablet Take 1 mg by mouth 2 (two) times daily.  2  . metFORMIN (GLUCOPHAGE) 500 MG tablet Take 500 mg by mouth 2 (two) times daily.  0  . metoprolol (LOPRESSOR) 50 MG tablet Take 1 tablet by  mouth 2 (two) times daily.    . nicotine (NICODERM CQ - DOSED IN MG/24 HOURS) 14 mg/24hr patch Place 14 mg onto the skin daily.    Marland Kitchen oxyCODONE (OXY IR/ROXICODONE) 5 MG immediate release tablet Take 1 tablet by mouth every 4 (four) hours as needed.    . polyethylene glycol (MIRALAX / GLYCOLAX) packet Take 17 g by mouth daily as needed.    . senna-docusate (SENOKOT-S) 8.6-50 MG tablet Take 1 tablet by mouth at bedtime as needed for mild constipation. 30 tablet 6  . warfarin (COUMADIN) 2 MG tablet Take 2 tablets by mouth at bedtime.       Marland Kitchen aspirin EC  81 mg Oral Daily  . docusate sodium  100 mg Oral BID  . famotidine  20 mg Oral BID  . furosemide  40 mg Intravenous Q12H  . insulin aspart  0-5 Units Subcutaneous QHS  . insulin aspart  0-9 Units Subcutaneous TID WC  . insulin aspart  3 Units Subcutaneous TID WC  . metFORMIN  500 mg Oral BID  . metoprolol tartrate  50 mg Oral 4 times per day  . nicotine  14 mg Transdermal Daily  . potassium chloride  20 mEq Oral BID  . sodium chloride flush  3 mL Intravenous Q12H  .  sodium chloride flush  3 mL Intravenous Q12H  . sodium chloride flush  3 mL Intravenous Q12H  . cyanocobalamin  1,000 mcg Oral Daily  . warfarin  3 mg Oral ONCE-1800  . Warfarin - Pharmacist Dosing Inpatient   Does not apply q1800    Infusions: . [START ON 05/05/2015] sodium chloride    . sodium chloride    . heparin 1,850 Units/hr (05/04/15 1115)    No Known Allergies  Social History   Social History  . Marital Status: Married    Spouse Name: N/A  . Number of Children: N/A  . Years of Education: N/A   Occupational History  . Not on file.   Social History Main Topics  . Smoking status: Former Smoker    Types: Cigarettes  . Smokeless tobacco: Not on file  . Alcohol Use: No  . Drug Use: No  . Sexual Activity: Not on file   Other Topics Concern  . Not on file   Social History Narrative    Family History  Problem Relation Age of Onset  . Hypertension  Mother   . Hypertension Maternal Grandmother   . Diabetes Maternal Grandmother     PHYSICAL EXAM: Filed Vitals:   05/04/15 0800 05/04/15 1200  BP: 126/98 128/90  Pulse: 147 140  Temp:  98.8 F (37.1 C)  Resp: 14 18     Intake/Output Summary (Last 24 hours) at 05/04/15 1243 Last data filed at 05/04/15 1200  Gross per 24 hour  Intake 1195.82 ml  Output   1850 ml  Net -654.18 ml    General:  Well appearing. No respiratory difficulty HEENT: normal Neck: supple. no JVD. Carotids 2+ bilat; no bruits. No lymphadenopathy or thryomegaly appreciated. Cor: PMI nondisplaced. Fast rate. No rubs, gallops or murmurs. Mechanical clic. Lungs: clear Abdomen: soft, nontender, nondistended. No hepatosplenomegaly. No bruits or masses. Good bowel sounds. Extremities: no cyanosis, clubbing, rash, +1 pitting edema of feet/ankles Neuro: alert & oriented x 3, cranial nerves grossly intact. moves all 4 extremities w/o difficulty. Affect pleasant.  ECG: atrial flutter with ventricular rate of 141 bpm  Results for orders placed or performed during the hospital encounter of 05/03/15 (from the past 24 hour(s))  Troponin I     Status: Abnormal   Collection Time: 05/03/15  4:11 PM  Result Value Ref Range   Troponin I 0.19 (H) <0.031 ng/mL  Glucose, capillary     Status: Abnormal   Collection Time: 05/03/15  6:53 PM  Result Value Ref Range   Glucose-Capillary 110 (H) 65 - 99 mg/dL  Urine Drug Screen, Qualitative ([facility] only)     Status: None   Collection Time: 05/03/15  7:40 PM  Result Value Ref Range   Tricyclic, Ur Screen NONE DETECTED NONE DETECTED   Amphetamines, Ur Screen NONE DETECTED NONE DETECTED   MDMA (Ecstasy)Ur Screen NONE DETECTED NONE DETECTED   Cocaine Metabolite,Ur [place] NONE DETECTED NONE DETECTED   Opiate, Ur Screen NONE DETECTED NONE DETECTED   Phencyclidine (PCP) Ur S NONE DETECTED NONE DETECTED   Cannabinoid 50 Ng, Ur [place] NONE DETECTED NONE DETECTED   Barbiturates, Ur Screen NONE  DETECTED NONE DETECTED   Benzodiazepine, Ur Scrn NONE DETECTED NONE DETECTED   Methadone Scn, Ur NONE DETECTED NONE DETECTED  MRSA PCR Screening     Status: None   Collection Time: 05/03/15  7:41 PM  Result Value Ref Range   MRSA by PCR NEGATIVE NEGATIVE  Troponin I     Status: Abnormal  Collection Time: 05/03/15  8:25 PM  Result Value Ref Range   Troponin I 0.27 (H) <0.031 ng/mL  Glucose, capillary     Status: Abnormal   Collection Time: 05/03/15  9:12 PM  Result Value Ref Range   Glucose-Capillary 159 (H) 65 - 99 mg/dL  Troponin I     Status: Abnormal   Collection Time: 05/04/15  2:02 AM  Result Value Ref Range   Troponin I 0.41 (H) <0.031 ng/mL  Protime-INR     Status: Abnormal   Collection Time: 05/04/15  7:10 AM  Result Value Ref Range   Prothrombin Time 19.9 (H) 11.4 - 15.0 seconds   INR [id number]   Basic metabolic panel     Status: Abnormal   Collection Time: 05/04/15  7:10 AM  Result Value Ref Range   Sodium 135 135 - 145 mmol/L   Potassium 4.0 3.5 - 5.1 mmol/L   Chloride 100 (L) 101 - 111 mmol/L   CO2 23 22 - 32 mmol/L   Glucose, Bld 105 (H) 65 - 99 mg/dL   BUN 14 6 - 20 mg/dL   Creatinine, Ser 0.76 0.61 - 1.24 mg/dL   Calcium 8.8 (L) 8.9 - 10.3 mg/dL   GFR calc non Af Amer >60 >60 mL/min   GFR calc Af Amer >60 >60 mL/min   Anion gap 12 5 - 15  CBC     Status: Abnormal   Collection Time: 05/04/15  7:10 AM  Result Value Ref Range   WBC 10.8 (H) 3.8 - 10.6 K/uL   RBC 3.57 (L) 4.40 - 5.90 MIL/uL   Hemoglobin 9.2 (L) 13.0 - 18.0 g/dL   HCT 27.8 (L) 40.0 - 52.0 %   MCV 77.8 (L) 80.0 - 100.0 fL   MCH 25.8 (L) 26.0 - 34.0 pg   MCHC 33.1 32.0 - 36.0 g/dL   RDW 17.8 (H) 11.5 - 14.5 %   Platelets 251 150 - 440 K/uL  Troponin I     Status: Abnormal   Collection Time: 05/04/15  7:11 AM  Result Value Ref Range   Troponin I 0.38 (H) <0.031 ng/mL  Glucose, capillary     Status: Abnormal   Collection Time: 05/04/15  7:22 AM  Result Value Ref Range   Glucose-Capillary 116  (H) 65 - 99 mg/dL  Heparin level (unfractionated)     Status: Abnormal   Collection Time: 05/04/15 10:28 AM  Result Value Ref Range   Heparin Unfractionated <0.10 (L) 0.30 - 0.70 IU/mL  APTT     Status: None   Collection Time: 05/04/15 10:28 AM  Result Value Ref Range   aPTT 35 24 - 36 seconds  Glucose, capillary     Status: Abnormal   Collection Time: 05/04/15 11:16 AM  Result Value Ref Range   Glucose-Capillary 109 (H) 65 - 99 mg/dL   Dg Chest Port 1 View  05/03/2015  CLINICAL DATA:  Atrial fibrillation EXAM: PORTABLE CHEST 1 VIEW COMPARISON:  04/22/2015 FINDINGS: Mild cardiomegaly. Diffuse edema has resolved. Low lung volumes. Lungs are grossly clear. No pneumothorax. IMPRESSION: Pulmonary edema has resolved. Lungs are under aerated and grossly clear. Electronically Signed   By: Marybelle Killings M.D.   On: 05/03/2015 11:49     ASSESSMENT AND PLAN: Atrial fibrillation with rapid ventricular response s/p aortic valve replacement at [facility] with mechanical valve. Mildly elevated troponins may be related to demand ischemia from rapid afib. Amiodarone drip for rhythm control and metoprolol for rate control. Heart rate still in  140's. Will give amiodarone bolus and increase metorpolol frequency.  Pt has had difficulty getting INR to therapeutic range of 2-3. Currently subtherapeutic. I have ordered heparin drip. Will check echo to assess for possible pericardial effusion. Plan for cardioversion in am if pt does not convert before then.  Pt had some chest pain this morning. His coronaries are normal per cath. The pain is likely surgical soreness.  Daune Perch, NP 05/04/2015 12:43 PM

## 2015-05-05 ENCOUNTER — Inpatient Hospital Stay: Payer: 59 | Admitting: Registered Nurse

## 2015-05-05 ENCOUNTER — Encounter
Admission: EM | Disposition: A | Discharge status: Home / Self Care | Payer: Self-pay | Source: Home / Self Care | Attending: Internal Medicine

## 2015-05-05 ENCOUNTER — Encounter: Payer: Self-pay | Admitting: Anesthesiology

## 2015-05-05 DIAGNOSIS — D509 Iron deficiency anemia, unspecified: Secondary | ICD-10-CM

## 2015-05-05 DIAGNOSIS — G4733 Obstructive sleep apnea (adult) (pediatric): Secondary | ICD-10-CM

## 2015-05-05 DIAGNOSIS — Z7901 Long term (current) use of anticoagulants: Secondary | ICD-10-CM

## 2015-05-05 DIAGNOSIS — Z952 Presence of prosthetic heart valve: Secondary | ICD-10-CM

## 2015-05-05 DIAGNOSIS — E871 Hypo-osmolality and hyponatremia: Secondary | ICD-10-CM

## 2015-05-05 DIAGNOSIS — R7989 Other specified abnormal findings of blood chemistry: Secondary | ICD-10-CM

## 2015-05-05 DIAGNOSIS — R778 Other specified abnormalities of plasma proteins: Secondary | ICD-10-CM

## 2015-05-05 DIAGNOSIS — D72829 Elevated white blood cell count, unspecified: Secondary | ICD-10-CM

## 2015-05-05 DIAGNOSIS — E669 Obesity, unspecified: Secondary | ICD-10-CM

## 2015-05-05 DIAGNOSIS — D649 Anemia, unspecified: Secondary | ICD-10-CM

## 2015-05-05 HISTORY — PX: ELECTROPHYSIOLOGIC STUDY: SHX172A

## 2015-05-05 LAB — GLUCOSE, CAPILLARY
Glucose-Capillary: 103 mg/dL — ABNORMAL HIGH (ref 65–99)
Glucose-Capillary: 102 mg/dL — ABNORMAL HIGH (ref 65–99)

## 2015-05-05 LAB — ECHOCARDIOGRAM COMPLETE
HEIGHTINCHES: 72 in
WEIGHTICAEL: 6240 [oz_av]

## 2015-05-05 LAB — CBC
HCT: 29.7 % — ABNORMAL LOW (ref 40.0–52.0)
Hemoglobin: 9.6 g/dL — ABNORMAL LOW (ref 13.0–18.0)
MCH: 25.2 pg — AB (ref 26.0–34.0)
MCHC: 32.4 g/dL (ref 32.0–36.0)
MCV: 77.8 fL — AB (ref 80.0–100.0)
PLATELETS: 198 10*3/uL (ref 150–440)
RBC: 3.82 MIL/uL — AB (ref 4.40–5.90)
RDW: 17.7 % — ABNORMAL HIGH (ref 11.5–14.5)
WBC: 8.9 10*3/uL (ref 3.8–10.6)

## 2015-05-05 LAB — PROTIME-INR
INR: 1.81
PROTHROMBIN TIME: 20.9 s — AB (ref 11.4–15.0)

## 2015-05-05 SURGERY — CARDIOVERSION (CATH LAB)
Anesthesia: General

## 2015-05-05 MED ORDER — AMIODARONE HCL 200 MG PO TABS: 400.0000 mg | ORAL_TABLET | Freq: Two times a day (BID) | ORAL | Status: DC

## 2015-05-05 MED ORDER — AMIODARONE HCL 200 MG PO TABS
800.0000 mg | ORAL_TABLET | Freq: Once | ORAL | Status: AC
  Administered 2015-05-05: 800 mg via ORAL
  Filled 2015-05-05: qty 4

## 2015-05-05 MED ORDER — WARFARIN SODIUM 2 MG PO TABS: 6.0000 mg | ORAL_TABLET | Freq: Every day | ORAL | Status: DC

## 2015-05-05 MED ORDER — PROPOFOL 10 MG/ML IV BOLUS
INTRAVENOUS | Status: DC | PRN
  Administered 2015-05-05: 120 mg via INTRAVENOUS

## 2015-05-05 MED ORDER — AMIODARONE HCL 400 MG PO TABS: 400.0000 mg | ORAL_TABLET | Freq: Two times a day (BID) | ORAL | Status: DC

## 2015-05-05 NOTE — Progress Notes (Signed)
SUBJECTIVE: Patient is feeling much better after cardioversion  Filed Vitals:   05/05/15 0905 05/05/15 0906 05/05/15 0907 05/05/15 0918  BP:   113/73 101/68  Pulse: 77 70 74 77  Temp:      TempSrc:      Resp: 13 15 20 20   Height:      Weight:      SpO2: 99% 100% 100% 99%    Intake/Output Summary (Last 24 hours) at 05/05/15 0929 Last data filed at 05/05/15 0901  Gross per 24 hour  Intake 1097.68 ml  Output   1650 ml  Net -552.32 ml    LABS: Basic Metabolic Panel:  Recent Labs  05/03/15 1056 05/04/15 0710  NA 134* 135  K 3.9 4.0  CL 100* 100*  CO2 24 23  GLUCOSE 124* 105*  BUN 17 14  CREATININE 0.62 0.76  CALCIUM 8.8* 8.8*   Liver Function Tests:  Recent Labs  05/03/15 1056  AST 27  ALT 160*  ALKPHOS 96  BILITOT 0.7  PROT 6.5  ALBUMIN 3.5   No results for input(s): LIPASE, AMYLASE in the last 72 hours. CBC:  Recent Labs  05/03/15 1056 05/04/15 0710  WBC 12.2* 10.8*  HGB 9.4* 9.2*  HCT 28.9* 27.8*  MCV 79.3* 77.8*  PLT 291 251   Cardiac Enzymes:  Recent Labs  05/03/15 2025 05/04/15 0202 05/04/15 0711  TROPONINI 0.27* 0.41* 0.38*   BNP: Invalid input(s): POCBNP D-Dimer: No results for input(s): DDIMER in the last 72 hours. Hemoglobin A1C:  Recent Labs  05/03/15 1056  HGBA1C 6.2*   Fasting Lipid Panel: No results for input(s): CHOL, HDL, LDLCALC, TRIG, CHOLHDL, LDLDIRECT in the last 72 hours. Thyroid Function Tests: No results for input(s): TSH, T4TOTAL, T3FREE, THYROIDAB in the last 72 hours.  Invalid input(s): FREET3 Anemia Panel: No results for input(s): VITAMINB12, FOLATE, FERRITIN, TIBC, IRON, RETICCTPCT in the last 72 hours.   PHYSICAL EXAM General: Well developed, well nourished, in no acute distress HEENT:  Normocephalic and atramatic Neck:  No JVD.  Lungs: Clear bilaterally to auscultation and percussion. Heart: HRRR . Normal S1 and S2 without gallops or murmurs.  Abdomen: Bowel sounds are positive, abdomen soft  and non-tender  Msk:  Back normal, normal gait. Normal strength and tone for age. Extremities: No clubbing, cyanosis or edema.   Neuro: Alert and oriented X 3. Psych:  Good affect, responds appropriately  TELEMETRY: Sinus rhythm  ASSESSMENT AND PLAN: Patient just converted to sinus rhythm and is doing very well continue amiodarone overnight.  Active Problems:   Ray-fib (HCC)   Atrial fibrillation with RVR (HCC)    Russell Laming A, MD, [facility] 05/05/2015 9:29 AM

## 2015-05-05 NOTE — Transfer of Care (Signed)
Immediate Anesthesia Transfer of Care Note  Patient: Russell Ray  Procedure(s) Performed: Procedure(s): Cardioversion (N/A)  Patient Location: PACU and Short Stay  Anesthesia Type:General  Level of Consciousness: awake, alert  and oriented  Airway & Oxygen Therapy: Patient Spontanous Breathing and Patient connected to nasal cannula oxygen  Post-op Assessment: Report given to RN and Post -op Vital signs reviewed and stable  Post vital signs: Reviewed and stable  Last Vitals:  Filed Vitals:   05/05/15 0906 05/05/15 0907  BP:  113/73  Pulse: 70 74  Temp:    Resp: 15 20    Complications: No apparent anesthesia complications

## 2015-05-05 NOTE — Progress Notes (Signed)
Informed pt of the need to stay in bed for 30 minutes post cardioversion. Pt refused and stated he has to get up and sit in the chair. Discussed with pt the need to monitor his blood pressure frequently after cardioversion. He refused and stated his doctor is aware and stated he can have his blood pressure cuff off and only have his blood pressure checked Q4hr. Stated he does not need his O2 prob on because he does not have any respiratory problems.

## 2015-05-05 NOTE — Anesthesia Preprocedure Evaluation (Addendum)
Anesthesia Evaluation  Patient identified by MRN, date of birth, ID band Patient awake    Reviewed: Allergy & Precautions, NPO status , Patient's Chart, lab work & pertinent test results, reviewed documented beta blocker date and time   Airway Mallampati: III  TM Distance: >3 FB     Dental  (+) Chipped   Pulmonary shortness of breath, former smoker,           Cardiovascular hypertension, Pt. on medications and Pt. on home beta blockers + Orthopnea       Neuro/Psych Anxiety    GI/Hepatic   Endo/Other  diabetesMorbid obesity  Renal/GU      Musculoskeletal   Abdominal   Peds  Hematology   Anesthesia Other Findings A fib. Valve replaacement. Smoke quit in [place].  Reproductive/Obstetrics                            Anesthesia Physical Anesthesia Plan  ASA: III  Anesthesia Plan: General   Post-op Pain Management:    Induction: Intravenous  Airway Management Planned: Nasal Cannula  Additional Equipment:   Intra-op Plan:   Post-operative Plan:   Informed Consent: I have reviewed the patients History and Physical, chart, labs and discussed the procedure including the risks, benefits and alternatives for the proposed anesthesia with the patient or authorized representative who has indicated his/her understanding and acceptance.     Plan Discussed with: CRNA  Anesthesia Plan Comments:         Anesthesia Quick Evaluation

## 2015-05-05 NOTE — Discharge Summary (Signed)
[facility] [place] NAME: Russell Ray    MR#:  [id number]  DATE OF BIRTH:  05-17-1970  DATE OF ADMISSION:  05/03/2015 ADMITTING PHYSICIAN: Theodoro Grist, MD  DATE OF DISCHARGE: No discharge date for patient encounter.  PRIMARY CARE PHYSICIAN: Volanda Napoleon, MD     ADMISSION DIAGNOSIS:  Atrial flutter, unspecified type ([place]) [I48.92]  DISCHARGE DIAGNOSIS:  Principal Problem:   A-fib ([place]) Active Problems:   Atrial fibrillation with RVR (H[facility])   Elevated troponin   Obesity   Obstructive sleep apnea   Anticoagulated on Coumadin   H/O aortic valve replacement   Hyponatremia   Leukocytosis   Anemia   SECONDARY DIAGNOSIS:   Past Medical History  Diagnosis Date  . Diabetes mellitus without complication ([place])   . Hypertension   . Anxiety     .pro HOSPITAL COURSE:   Patient is a 45 year old Caucasian male with past medical history significant history of diabetes, hypertension, anxiety, status post recent aortic valve replacement with metal valve, complicated by atrial fibrillation at [facility] postoperatively, converting to sinus rhythm after amiodarone infusion and discharged on amiodarone presents to the hospital with complaints of palpitations. On arrival to emergency room, he was noted to be in A. fib, RVR, rate of 140, sustained. Patient was initiated on amiodarone IV drip. Patient was seen by cardiologist and followed along while he was in the hospital. Patient, however, did not convert to sinus rhythm as it was expected and underwent cardioversion on 05/05/2015. He converted into normal sinus rhythm at that point. Patient was recommended to continue amiodarone loading at 400 mg twice daily dose and follow up with cardiologist as outpatient. Patient's Coumadin level was found to be low on arrival to emergency room, with pro time level of 18.7 and INR of I.56. While in the hospital patient was  managed on advancing doses of Coumadin and heparin intravenously. Patient was noted to have mild elevation of troponin with maximum level of 0.41 with no chest pains, felt to be demand ischemia. It was felt that patient is stable to be discharged home today on 05/05/2015, patient was advised to follow up with cardiologist, advance his Coumadin dose to 6 mg daily, get pro time checked again in the next 1-2 days after discharge and a chest medications depending on the need. He was also advised to continue loading amiodarone. Discussion by problem: #1. A. fib, RVR, status post cardioversion ninth of March 2017, converted into sinus rhythm. Continue amiodarone load at 400 mg twice daily dose, follow up with cardiologist as outpatient for further recommendations. Continue advanced doses of Coumadin at 6 mg once daily dose following ProTime INR closely. #2. Hyponatremia and fluid overloaded patient, results his diuretic. #3. Elevated troponin, likely demand ischemia. #4 OF the sleep apnea, suspected clinically, she was managed on auto CPAP while in the hospital, he would benefit from a sleep study as outpatient, discussed this patient extensively, #5. Anxiety, continue outpatient medications. #6. Hyperglycemia, hemoglobin A1c 6.1. According to the patient, continue metformin, which was initiated to improve tissue sensitivity to insulin. #7. Leukocytosis, likely stress related, resolved with no antibiotic therapy. #8. Tobacco abuse, patient was advised to initiate nicotine replacement therapy if needed, patient advised of quitting few weeks ago, discussed this patient while he was in the hospital  DISCHARGE CONDITIONS:   Stable  CONSULTS OBTAINED:  Treatment Team:  Russell David, MD  DRUG ALLERGIES:  No Known Allergies  DISCHARGE MEDICATIONS:   Current Discharge Medication List    CONTINUE these medications which have CHANGED   Details  amiodarone (PACERONE) 400 MG tablet Take 1 tablet (400 mg  total) by mouth 2 (two) times daily. Qty: 60 tablet, Refills: 2    warfarin (COUMADIN) 2 MG tablet Take 3 tablets (6 mg total) by mouth at bedtime. Qty: 90 tablet, Refills: 5      CONTINUE these medications which have NOT CHANGED   Details  acetaminophen (TYLENOL) 325 MG tablet Take 650 mg by mouth every 4 (four) hours as needed for moderate pain.    cyanocobalamin 1000 MCG tablet Take 1,000 mcg by mouth daily.    cyclobenzaprine (FLEXERIL) 5 MG tablet Take 5 mg by mouth 3 (three) times daily as needed.    furosemide (LASIX) 40 MG tablet Take 1 tablet (40 mg total) by mouth daily. Qty: 30 tablet, Refills: 6    KLOR-CON M20 20 MEQ tablet Take 1 tablet by mouth 2 (two) times daily.    LORazepam (ATIVAN) 1 MG tablet Take 1 mg by mouth 2 (two) times daily. Refills: 2    metFORMIN (GLUCOPHAGE) 500 MG tablet Take 500 mg by mouth 2 (two) times daily. Refills: 0    metoprolol (LOPRESSOR) 50 MG tablet Take 1 tablet by mouth 2 (two) times daily.    nicotine (NICODERM CQ - DOSED IN MG/24 HOURS) 14 mg/24hr patch Place 14 mg onto the skin daily.    oxyCODONE (OXY IR/ROXICODONE) 5 MG immediate release tablet Take 1 tablet by mouth every 4 (four) hours as needed.    polyethylene glycol (MIRALAX / GLYCOLAX) packet Take 17 g by mouth daily as needed.    senna-docusate (SENOKOT-S) 8.6-50 MG tablet Take 1 tablet by mouth at bedtime as needed for mild constipation. Qty: 30 tablet, Refills: 6      STOP taking these medications     aspirin EC 81 MG tablet          DISCHARGE INSTRUCTIONS:    Patient is to follow-up with primary care physician, cardiologist as outpatient  If you experience worsening of your admission symptoms, develop shortness of breath, life threatening emergency, suicidal or homicidal thoughts you must seek medical attention immediately by calling 911 or calling your MD immediately  if symptoms less severe.  You Must read complete instructions/literature along with  all the possible adverse reactions/side effects for all the Medicines you take and that have been prescribed to you. Take any new Medicines after you have completely understood and accept all the possible adverse reactions/side effects.   Please note  You were cared for by a hospitalist during your hospital stay. If you have any questions about your discharge medications or the care you received while you were in the hospital after you are discharged, you can call the unit and asked to speak with the hospitalist on call if the hospitalist that took care of you is not available. Once you are discharged, your primary care physician will handle any further medical issues. Please note that NO REFILLS for any discharge medications will be authorized once you are discharged, as it is imperative that you return to your primary care physician (or establish a relationship with a primary care physician if you do not have one) for your aftercare needs so that they can reassess your need for medications and monitor your lab values.    Today   CHIEF COMPLAINT:   Chief Complaint  Patient presents with  . Tachycardia  HISTORY OF PRESENT ILLNESS:  Russell Ray  is a 45 y.o. male with a known history of diabetes, hypertension, anxiety, status post recent aortic valve replacement with metal valve, complicated by atrial fibrillation at [facility] postoperatively, converting to sinus rhythm after amiodarone infusion and discharged on amiodarone presents to the hospital with complaints of palpitations. On arrival to emergency room, he was noted to be in A. fib, RVR, rate of 140, sustained. Patient was initiated on amiodarone IV drip. Patient was seen by cardiologist and followed along while he was in the hospital. Patient, however, did not convert to sinus rhythm as it was expected and underwent cardioversion on 05/05/2015. He converted into normal sinus rhythm at that point. Patient was recommended  to continue amiodarone loading at 400 mg twice daily dose and follow up with cardiologist as outpatient. Patient's Coumadin level was found to be low on arrival to emergency room, with pro time level of 18.7 and INR of I.56. While in the hospital patient was managed on advancing doses of Coumadin and heparin intravenously. Patient was noted to have mild elevation of troponin with maximum level of 0.41 with no chest pains, felt to be demand ischemia. It was felt that patient is stable to be discharged home today on 05/05/2015, patient was advised to follow up with cardiologist, advance his Coumadin dose to 6 mg daily, get pro time checked again in the next 1-2 days after discharge and a chest medications depending on the need. He was also advised to continue loading amiodarone. Discussion by problem: #1. A. fib, RVR, status post cardioversion ninth of March 2017, converted into sinus rhythm. Continue amiodarone load at 400 mg twice daily dose, follow up with cardiologist as outpatient for further recommendations. Continue advanced doses of Coumadin at 6 mg once daily dose following ProTime INR closely. #2. Hyponatremia and fluid overloaded patient, results his diuretic. #3. Elevated troponin, likely demand ischemia. #4 OF the sleep apnea, suspected clinically, she was managed on auto CPAP while in the hospital, he would benefit from a sleep study as outpatient, discussed this patient extensively, #5. Anxiety, continue outpatient medications. #6. Hyperglycemia, hemoglobin A1c 6.1. According to the patient, continue metformin, which was initiated to improve tissue sensitivity to insulin. #7. Leukocytosis, likely stress related, resolved with no antibiotic therapy. #8. Tobacco abuse, patient was advised to initiate nicotine replacement therapy if needed, patient advised of quitting few weeks ago, discussed this patient while he was in the hospital    VITAL SIGNS:  Blood pressure 112/69, pulse 84,  temperature 98.6 F (37 C), temperature source Oral, resp. rate 16, height 6' (1.829 m), weight 176.903 kg (390 lb), SpO2 98 %.  I/O:   Intake/Output Summary (Last 24 hours) at 05/05/15 1649 Last data filed at 05/05/15 1117  Gross per 24 hour  Intake  444.3 ml  Output   1250 ml  Net -805.7 ml    PHYSICAL EXAMINATION:  GENERAL:  45 y.o.-year-old patient lying in the bed with no acute distress.  EYES: Pupils equal, round, reactive to light and accommodation. No scleral icterus. Extraocular muscles intact.  HEENT: Head atraumatic, normocephalic. Oropharynx and nasopharynx clear.  NECK:  Supple, no jugular venous distention. No thyroid enlargement, no tenderness.  LUNGS: Normal breath sounds bilaterally, no wheezing, rales,rhonchi or crepitation. No use of accessory muscles of respiration.  CARDIOVASCULAR: S1, S2 normal. No murmurs, rubs, or gallops.  ABDOMEN: Soft, non-tender, non-distended. Bowel sounds present. No organomegaly or mass.  EXTREMITIES: No pedal edema, cyanosis,  or clubbing.  NEUROLOGIC: Cranial nerves II through XII are intact. Muscle strength 5/5 in all extremities. Sensation intact. Gait not checked.  PSYCHIATRIC: The patient is alert and oriented x 3.  SKIN: No obvious rash, lesion, or ulcer.   DATA REVIEW:   CBC  Recent Labs Lab 05/05/15 1057  WBC 8.9  HGB 9.6*  HCT 29.7*  PLT 198    Chemistries   Recent Labs Lab 05/03/15 1056 05/04/15 0710  NA 134* 135  K 3.9 4.0  CL 100* 100*  CO2 24 23  GLUCOSE 124* 105*  BUN 17 14  CREATININE 0.62 0.76  CALCIUM 8.8* 8.8*  AST 27  --   ALT 160*  --   ALKPHOS 96  --   BILITOT 0.7  --     Cardiac Enzymes  Recent Labs Lab 05/04/15 0711  TROPONINI 0.38*    Microbiology Results  Results for orders placed or performed during the hospital encounter of 05/03/15  MRSA PCR Screening     Status: None   Collection Time: 05/03/15  7:41 PM  Result Value Ref Range Status   MRSA by PCR NEGATIVE NEGATIVE  Final    Comment:        The GeneXpert MRSA Assay (FDA approved for NASAL specimens only), is one component of a comprehensive MRSA colonization surveillance program. It is not intended to diagnose MRSA infection nor to guide or monitor treatment for MRSA infections.     RADIOLOGY:  No results found.  EKG:   Orders placed or performed during the hospital encounter of 05/03/15  . EKG 12-Lead  . EKG 12-Lead  . ED EKG  . ED EKG  . EKG 12-lead  . EKG 12-Lead pre-cardioversion  . EKG 12-Lead  . EKG 12-Lead pre-cardioversion  . EKG 12-Lead  . EKG 12-Lead  . EKG 12-Lead  . EKG 12-Lead  . EKG 12-Lead  . EKG 12-Lead  . EKG 12-Lead  . EKG 12-Lead  . EKG 12-Lead  . EKG 12-Lead  . EKG 12-Lead      Management plans discussed with the patient, family and they are in agreement.  CODE STATUS:     Code Status Orders        Start     Ordered   05/04/15 0936  Full code   Continuous     05/04/15 0938    Code Status History    Date Active Date Inactive Code Status Order ID Comments User Context   05/03/2015  5:34 PM 05/04/2015  9:38 AM Full Code [id number]  Theodoro Grist, MD ED   04/22/2015  7:37 AM 04/22/2015  9:30 PM Full Code [id number]  Lytle Butte, MD ED   03/18/2015  2:14 PM 03/19/2015  3:15 PM Full Code [id number]  Fritzi Mandes, MD Inpatient      TOTAL TIME TAKING CARE OF THIS PATIENT: 40 minutes.    Theodoro Grist M.D on 05/05/2015 at 4:49 PM  Between 7am to 6pm - Pager - [phone]  After 6pm go to [email or web address] - password [email or web address] [place]  Office  [phone]  CC: Primary care physician; Volanda Napoleon, MD

## 2015-05-05 NOTE — Discharge Instructions (Signed)
Electrical Cardioversion Electrical cardioversion is the delivery of a jolt of electricity to change the rhythm of the heart. Sticky patches or metal paddles are placed on the chest to deliver the electricity from a device. This is done to restore a normal rhythm. A rhythm that is too fast or not regular keeps the heart from pumping well. Electrical cardioversion is done in an emergency if:   There is low or no blood pressure as a result of the heart rhythm.   Normal rhythm must be restored as fast as possible to protect the brain and heart from further damage.   It may save a life. Cardioversion may be done for heart rhythms that are not immediately life threatening, such as atrial fibrillation or flutter, in which:   The heart is beating too fast or is not regular.   Medicine to change the rhythm has not worked.   It is safe to wait in order to allow time for preparation.  Symptoms of the abnormal rhythm are bothersome.  The risk of stroke and other serious problems can be reduced. LET [facility] CARE PROVIDER KNOW ABOUT:   Any allergies you have.  All medicines you are taking, including vitamins, herbs, eye drops, creams, and over-the-counter medicines.  Previous problems you or members of your family have had with the use of anesthetics.   Any blood disorders you have.   Previous surgeries you have had.   Medical conditions you have. RISKS AND COMPLICATIONS  Generally, this is a safe procedure. However, problems can occur and include:   Breathing problems related to the anesthetic used.  A blood clot that breaks free and travels to other parts of your body. This could cause a stroke or other problems. The risk of this is lowered by use of blood-thinning medicine (anticoagulant) prior to the procedure.  Cardiac arrest (rare). BEFORE THE PROCEDURE   You may have tests to detect blood clots in your heart and to evaluate heart function.  You may start taking  anticoagulants so your blood does not clot as easily.   Medicines may be given to help stabilize your heart rate and rhythm. PROCEDURE  You will be given medicine through an IV tube to reduce discomfort and make you sleepy (sedative).   An electrical shock will be delivered. AFTER THE PROCEDURE Your heart rhythm will be watched to make sure it does not change.    This information is not intended to replace advice given to you by your health care provider. Make sure you discuss any questions you have with your health care provider.   Document Released: 02/02/2002 Document Revised: 03/05/2014 Document Reviewed: 08/27/2012 Elsevier Interactive Patient Education 2016 Elsevier Inc.  Atrial Flutter Atrial flutter is a heart rhythm that can cause the heart to beat very fast (tachycardia). It originates in the upper chambers of the heart (atria). In atrial flutter, the top chambers of the heart (atria) often beat much faster than the bottom chambers of the heart (ventricles). Atrial flutter has a regular "saw toothed" appearance in an EKG readout. An EKG is a test that records the electrical activity of the heart. Atrial flutter can cause the heart to beat up to 150 beats per minute (BPM). Atrial flutter can either be short lived (paroxysmal) or permanent.  CAUSES  Causes of atrial flutter can be many. Some of these include:  Heart related issues:  Heart attack (myocardial infarction).  Heart failure.  Heart valve problems.  Poorly controlled high blood pressure (hypertension).  After open heart surgery.  Lung related issues:  A blood clot in the lungs (pulmonary embolism).  Chronic obstructive pulmonary disease (COPD). Medications used to treat COPD can attribute to atrial flutter.  Other related causes:  Hyperthyroidism.  Caffeine.  Some decongestant cold medications.  Low electrolyte levels such as potassium or magnesium.  Cocaine. SYMPTOMS  An awareness of your  heart beating rapidly (palpitations).  Shortness of breath.  Chest pain.  Low blood pressure (hypotension).  Dizziness or fainting. DIAGNOSIS  Different tests can be performed to diagnose atrial flutter.   An EKG.  Holter monitor. This is a 24-hour recording of your heart rhythm. You will also be given a diary. Write down all symptoms that you have and what you were doing at the time you experienced symptoms.  Cardiac event monitor. This small device can be worn for up to 30 days. When you have heart symptoms, you will push a button on the device. This will then record your heart rhythm.  Echocardiogram. This is an imaging test to look at your heart. Your caregiver will look at your heart valves and the ventricles.  Stress test. This test can help determine if the atrial flutter is related to exercise or if coronary artery disease is present.  Laboratory studies will look at certain blood levels like:  Complete blood count (CBC).  Potassium.  Magnesium.  Thyroid function. TREATMENT  Treatment of atrial flutter varies. A combination of therapies may be used or sometimes atrial flutter may need only 1 type of treatment.  Lab work: If your blood work, such as your electrolytes (potassium, magnesium) or your thyroid function tests, are abnormal, your caregiver will treat them accordingly.  Medication:  There are several different types of medications that can convert your heart to a normal rhythm and prevent atrial flutter from reoccurring.  Nonsurgical procedures: Nonsurgical techniques may be used to control atrial flutter. Some examples include:  Cardioversion. This technique uses either drugs or an electrical shock to restore a normal heart rhythm:  Cardioversion drugs may be given through an intravenous (IV) line to help "reset" the heart rhythm.  In electrical cardioversion, your caregiver shocks your heart with electrical energy. This helps to reset the heartbeat to a  normal rhythm.  Ablation. If atrial flutter is a persistent problem, an ablation may be needed. This procedure is done under mild sedation. High frequency radio-wave energy is used to destroy the area of heart tissue responsible for atrial flutter. SEEK IMMEDIATE MEDICAL CARE IF:  You have:  Dizziness.  Near fainting or fainting.  Shortness of breath.  Chest pain or pressure.  Sudden nausea or vomiting.  Profuse sweating. If you have the above symptoms, call your local emergency service immediately! Do not drive yourself to the hospital. MAKE SURE YOU:   Understand these instructions.  Will watch your condition.  Will get help right away if you are not doing well or get worse.   This information is not intended to replace advice given to you by your health care provider. Make sure you discuss any questions you have with your health care provider.   Document Released: 07/01/2008 Document Revised: 03/05/2014 Document Reviewed: 08/27/2014 Elsevier Interactive Patient Education [employer].

## 2015-05-05 NOTE — Progress Notes (Signed)
Spoke with Dr. Humphrey Rolls cardiology and informed him of change in rhythm. Noted of new Bundle branch block noted on ECG. Asked for an EKG. Stated he was not worried about the rhythm since there was still a p wave before every QRS and heart rate was still in 70's with no changes. Stated to send pt home and he will see pt at follow up appointment.

## 2015-05-05 NOTE — Progress Notes (Signed)
Report given to [facility]. Pt transported via bed by orderly Roselyn Reef. Pt was in Afib/Aflutter 140's on transfer. Wife at bedside.

## 2015-05-05 NOTE — Progress Notes (Signed)
Pt wheeled out by Nurse Raquel Sarna , RN. Discharge instructions reviewed with patient and spouse. AVS stated prescriptions was printed. However, previously speaking with Dr. Ether Griffins, the pt told her that he did not need any prescriptions since he already had the medications she was prescribing. Dr. Ether Griffins said via phone that she would not write any prescriptions for him then. Incidently, it still showed on AVS that there was available prescriptions.

## 2015-05-05 NOTE — Progress Notes (Signed)
Spoke with Dr. Humphrey Rolls with cardiology. Stated Mr. Russell Ray is ready to be discharged. Dr. Humphrey Rolls stated he was fine with discharging the patient this afternoon. Stated to give 800 mg PO dose of amiodarone now and remove gtt at 1400. Stated to give 400 mg amiodarone BID to start tomorrow (05/06/15).

## 2015-05-05 NOTE — Progress Notes (Signed)
  NAME:  Russell Ray   MRN: [id number] DOB:  1970/10/21   ADMIT DATE: 05/03/2015  Procedure: Electrical Cardioversion Indications:  Atrial Fibrillation  Procedure Details:    Time Out: Verified patient identification, verified procedure, site/side was marked, verified correct patient position, special equipment/implants available, medications/allergies/relevent history reviewed, required imaging and test results available.    Patient placed on cardiac monitor, pulse oximetry, supplemental oxygen as necessary.  Sedation given:  Pacer pads placed   Cardioverted . 200 J Cardioverted at 200 J Evaluation: Findings: Post procedure EKG shows: Sinus rhythm Complications: No complications Patient did well. Heart rate after cardioversion is 70 bpm.     Dionisio David, M.D. [facility]   05/05/2015 9:31 AM

## 2015-05-05 NOTE — Progress Notes (Signed)
Patient has remained in A Flutter via the monitor during this shift.  Heart rate has ranged from 100's to 140's.  Patient has had nothing to eat or drink except sips with meds since midnight due to possible procedure this afternoon.  Urine output has been adequate and patient has been resting most of the night with stable vital signs.  Will continue to monitor.

## 2015-05-05 NOTE — Anesthesia Procedure Notes (Signed)
Date/Time: 05/05/2015 8:56 AM Performed by: Doreen Salvage Pre-anesthesia Checklist: Patient identified, Emergency Drugs available, Suction available and Patient being monitored Patient Re-evaluated:Patient Re-evaluated prior to inductionOxygen Delivery Method: Nasal cannula Intubation Type: IV induction Dental Injury: Teeth and Oropharynx as per pre-operative assessment  Comments: Nasal cannula with etCO2 monitoring

## 2015-05-05 NOTE — Progress Notes (Signed)
Patient refusing to wear continuous oxygen monitoring.  States "my doctor told me today I didn't have to".  Oxygen saturation currently 98% on room air.  Will continue to spot check O2 sat.

## 2015-05-05 NOTE — Anesthesia Postprocedure Evaluation (Signed)
Anesthesia Post Note  Patient: Russell Ray  Procedure(s) Performed: Procedure(s) (LRB): Cardioversion (N/A)  Patient location during evaluation: Cath Lab Anesthesia Type: General Level of consciousness: awake and alert Pain management: pain level controlled Vital Signs Assessment: post-procedure vital signs reviewed and stable Respiratory status: spontaneous breathing, nonlabored ventilation, respiratory function stable and patient connected to nasal cannula oxygen Cardiovascular status: blood pressure returned to baseline and stable Postop Assessment: no signs of nausea or vomiting Anesthetic complications: no    Last Vitals:  Filed Vitals:   05/05/15 0918 05/05/15 0930  BP: 101/68 112/73  Pulse: 77 75  Temp:    Resp: 20 2    Last Pain:  Filed Vitals:   05/05/15 0952  PainSc: 7                  Smayan Hackbart S

## 2015-05-06 ENCOUNTER — Encounter: Payer: Self-pay | Admitting: Cardiovascular Disease

## 2015-05-17 ENCOUNTER — Encounter: Payer: Self-pay | Admitting: Internal Medicine

## 2015-05-17 ENCOUNTER — Encounter
Admission: EM | Disposition: A | Discharge status: Home / Self Care | Payer: Self-pay | Source: Home / Self Care | Attending: Emergency Medicine

## 2015-05-17 ENCOUNTER — Emergency Department: Payer: 59

## 2015-05-17 ENCOUNTER — Observation Stay: Payer: 59 | Admitting: Certified Registered Nurse Anesthetist

## 2015-05-17 ENCOUNTER — Observation Stay
Admission: EM | Admit: 2015-05-17 | Discharge: 2015-05-17 | Disposition: A | Discharge status: Home / Self Care | Payer: 59 | Attending: Internal Medicine | Admitting: Internal Medicine

## 2015-05-17 ENCOUNTER — Ambulatory Visit: Admit: 2015-05-17 | Payer: Self-pay | Admitting: Cardiovascular Disease

## 2015-05-17 DIAGNOSIS — R079 Chest pain, unspecified: Secondary | ICD-10-CM | POA: Insufficient documentation

## 2015-05-17 DIAGNOSIS — I517 Cardiomegaly: Secondary | ICD-10-CM | POA: Diagnosis not present

## 2015-05-17 DIAGNOSIS — Z87891 Personal history of nicotine dependence: Secondary | ICD-10-CM | POA: Insufficient documentation

## 2015-05-17 DIAGNOSIS — Z952 Presence of prosthetic heart valve: Secondary | ICD-10-CM | POA: Diagnosis not present

## 2015-05-17 DIAGNOSIS — R778 Other specified abnormalities of plasma proteins: Secondary | ICD-10-CM | POA: Insufficient documentation

## 2015-05-17 DIAGNOSIS — M7989 Other specified soft tissue disorders: Secondary | ICD-10-CM | POA: Diagnosis not present

## 2015-05-17 DIAGNOSIS — G4733 Obstructive sleep apnea (adult) (pediatric): Secondary | ICD-10-CM | POA: Diagnosis not present

## 2015-05-17 DIAGNOSIS — D649 Anemia, unspecified: Secondary | ICD-10-CM | POA: Diagnosis not present

## 2015-05-17 DIAGNOSIS — D72829 Elevated white blood cell count, unspecified: Secondary | ICD-10-CM | POA: Insufficient documentation

## 2015-05-17 DIAGNOSIS — R0601 Orthopnea: Secondary | ICD-10-CM | POA: Insufficient documentation

## 2015-05-17 DIAGNOSIS — I471 Supraventricular tachycardia: Secondary | ICD-10-CM | POA: Diagnosis not present

## 2015-05-17 DIAGNOSIS — Z720 Tobacco use: Secondary | ICD-10-CM | POA: Insufficient documentation

## 2015-05-17 DIAGNOSIS — I4891 Unspecified atrial fibrillation: Principal | ICD-10-CM | POA: Diagnosis present

## 2015-05-17 DIAGNOSIS — Z79899 Other long term (current) drug therapy: Secondary | ICD-10-CM | POA: Diagnosis not present

## 2015-05-17 DIAGNOSIS — I10 Essential (primary) hypertension: Secondary | ICD-10-CM | POA: Insufficient documentation

## 2015-05-17 DIAGNOSIS — R0602 Shortness of breath: Secondary | ICD-10-CM | POA: Diagnosis not present

## 2015-05-17 DIAGNOSIS — F419 Anxiety disorder, unspecified: Secondary | ICD-10-CM | POA: Insufficient documentation

## 2015-05-17 DIAGNOSIS — R112 Nausea with vomiting, unspecified: Secondary | ICD-10-CM | POA: Diagnosis not present

## 2015-05-17 DIAGNOSIS — E871 Hypo-osmolality and hyponatremia: Secondary | ICD-10-CM | POA: Insufficient documentation

## 2015-05-17 DIAGNOSIS — Z7901 Long term (current) use of anticoagulants: Secondary | ICD-10-CM | POA: Diagnosis not present

## 2015-05-17 DIAGNOSIS — E119 Type 2 diabetes mellitus without complications: Secondary | ICD-10-CM | POA: Diagnosis not present

## 2015-05-17 DIAGNOSIS — Z7984 Long term (current) use of oral hypoglycemic drugs: Secondary | ICD-10-CM | POA: Diagnosis not present

## 2015-05-17 HISTORY — DX: Unspecified atrial fibrillation: I48.91

## 2015-05-17 HISTORY — PX: ELECTROPHYSIOLOGIC STUDY: SHX172A

## 2015-05-17 LAB — COMPREHENSIVE METABOLIC PANEL
ALBUMIN: 3.6 g/dL (ref 3.5–5.0)
ALK PHOS: 83 U/L (ref 38–126)
ALT: 24 U/L (ref 17–63)
AST: 21 U/L (ref 15–41)
Anion gap: 8 (ref 5–15)
BILIRUBIN TOTAL: 0.6 mg/dL (ref 0.3–1.2)
BUN: 13 mg/dL (ref 6–20)
CO2: 24 mmol/L (ref 22–32)
Calcium: 9.2 mg/dL (ref 8.9–10.3)
Chloride: 102 mmol/L (ref 101–111)
Creatinine, Ser: 0.54 mg/dL — ABNORMAL LOW (ref 0.61–1.24)
GFR calc Af Amer: >60 mL/min (ref >60)
GFR calc non Af Amer: >60 mL/min (ref >60)
GLUCOSE: 104 mg/dL — AB (ref 65–99)
POTASSIUM: 4.1 mmol/L (ref 3.5–5.1)
SODIUM: 134 mmol/L — AB (ref 135–145)
TOTAL PROTEIN: 6.6 g/dL (ref 6.5–8.1)

## 2015-05-17 LAB — CBC
HEMATOCRIT: 33.3 % — AB (ref 40.0–52.0)
HEMOGLOBIN: 10.7 g/dL — AB (ref 13.0–18.0)
MCH: 25 pg — ABNORMAL LOW (ref 26.0–34.0)
MCHC: 32.2 g/dL (ref 32.0–36.0)
MCV: 77.5 fL — ABNORMAL LOW (ref 80.0–100.0)
Platelets: 295 10*3/uL (ref 150–440)
RBC: 4.29 MIL/uL — AB (ref 4.40–5.90)
RDW: 18.6 % — AB (ref 11.5–14.5)
WBC: 9.3 10*3/uL (ref 3.8–10.6)

## 2015-05-17 LAB — BRAIN NATRIURETIC PEPTIDE: B Natriuretic Peptide: 177 pg/mL — ABNORMAL HIGH (ref 0.0–100.0)

## 2015-05-17 LAB — TROPONIN I
Troponin I: <0.03 ng/mL (ref <0.031)
Troponin I: <0.03 ng/mL (ref <0.031)

## 2015-05-17 SURGERY — CARDIOVERSION (CATH LAB)
Anesthesia: General

## 2015-05-17 MED ORDER — AMIODARONE HCL 150 MG/3ML IV SOLN
INTRAVENOUS | Status: AC
  Filled 2015-05-17: qty 3

## 2015-05-17 MED ORDER — ACETAMINOPHEN 325 MG PO TABS: 650.0000 mg | ORAL_TABLET | ORAL | Status: DC | PRN

## 2015-05-17 MED ORDER — INSULIN ASPART 100 UNIT/ML ~~LOC~~ SOLN: 0.0000 [IU] | Freq: Every day | SUBCUTANEOUS | Status: DC

## 2015-05-17 MED ORDER — ONDANSETRON HCL 4 MG/2ML IJ SOLN: 4.0000 mg | Freq: Four times a day (QID) | INTRAMUSCULAR | Status: DC | PRN

## 2015-05-17 MED ORDER — METFORMIN HCL 500 MG PO TABS: 500.0000 mg | ORAL_TABLET | Freq: Two times a day (BID) | ORAL | Status: DC

## 2015-05-17 MED ORDER — NICOTINE 14 MG/24HR TD PT24: 14.0000 mg | MEDICATED_PATCH | Freq: Every day | TRANSDERMAL | Status: DC

## 2015-05-17 MED ORDER — METOPROLOL TARTRATE 1 MG/ML IV SOLN
5.0000 mg | Freq: Once | INTRAVENOUS | Status: AC
  Administered 2015-05-17: 5 mg via INTRAVENOUS

## 2015-05-17 MED ORDER — ONDANSETRON HCL 4 MG PO TABS: 4.0000 mg | ORAL_TABLET | Freq: Four times a day (QID) | ORAL | Status: DC | PRN

## 2015-05-17 MED ORDER — SODIUM CHLORIDE 0.9% FLUSH: 3.0000 mL | Freq: Two times a day (BID) | INTRAVENOUS | Status: DC

## 2015-05-17 MED ORDER — VITAMIN B-12 1000 MCG PO TABS: 1000.0000 ug | ORAL_TABLET | Freq: Every day | ORAL | Status: DC

## 2015-05-17 MED ORDER — HYDROCODONE-ACETAMINOPHEN 5-325 MG PO TABS
1.0000 | ORAL_TABLET | ORAL | Status: DC | PRN
  Administered 2015-05-17: 2 via ORAL

## 2015-05-17 MED ORDER — PROPOFOL 10 MG/ML IV BOLUS
INTRAVENOUS | Status: DC | PRN
  Administered 2015-05-17: 20 mg via INTRAVENOUS
  Administered 2015-05-17: 100 mg via INTRAVENOUS
  Administered 2015-05-17: 20 mg via INTRAVENOUS

## 2015-05-17 MED ORDER — SENNOSIDES-DOCUSATE SODIUM 8.6-50 MG PO TABS: 1.0000 | ORAL_TABLET | Freq: Every evening | ORAL | Status: DC | PRN

## 2015-05-17 MED ORDER — SODIUM CHLORIDE 0.9% FLUSH: 3.0000 mL | INTRAVENOUS | Status: DC | PRN

## 2015-05-17 MED ORDER — FUROSEMIDE 40 MG PO TABS: 40.0000 mg | ORAL_TABLET | Freq: Every day | ORAL | Status: DC

## 2015-05-17 MED ORDER — POTASSIUM CHLORIDE CRYS ER 20 MEQ PO TBCR: 20.0000 meq | EXTENDED_RELEASE_TABLET | Freq: Two times a day (BID) | ORAL | Status: DC

## 2015-05-17 MED ORDER — SODIUM CHLORIDE 0.9 % IV SOLN
250.0000 mL | INTRAVENOUS | Status: DC
  Administered 2015-05-17: 11:00:00 via INTRAVENOUS

## 2015-05-17 MED ORDER — WARFARIN SODIUM 6 MG PO TABS: 6.0000 mg | ORAL_TABLET | Freq: Every day | ORAL | Status: DC

## 2015-05-17 MED ORDER — METOPROLOL TARTRATE 50 MG PO TABS: 50.0000 mg | ORAL_TABLET | Freq: Two times a day (BID) | ORAL | Status: DC

## 2015-05-17 MED ORDER — MORPHINE SULFATE (PF) 2 MG/ML IV SOLN
2.0000 mg | Freq: Once | INTRAVENOUS | Status: AC
  Administered 2015-05-17: 2 mg via INTRAVENOUS
  Filled 2015-05-17: qty 1

## 2015-05-17 MED ORDER — ACETAMINOPHEN 325 MG PO TABS: 650.0000 mg | ORAL_TABLET | Freq: Four times a day (QID) | ORAL | Status: DC | PRN

## 2015-05-17 MED ORDER — METOPROLOL TARTRATE 1 MG/ML IV SOLN
5.0000 mg | Freq: Once | INTRAVENOUS | Status: AC
  Administered 2015-05-17: 5 mg via INTRAVENOUS
  Filled 2015-05-17: qty 5

## 2015-05-17 MED ORDER — DIGOXIN 0.25 MG/ML IJ SOLN
0.5000 mg | Freq: Every day | INTRAMUSCULAR | Status: DC
  Administered 2015-05-17: 0.5 mg via INTRAVENOUS
  Filled 2015-05-17: qty 2

## 2015-05-17 MED ORDER — AMIODARONE HCL 200 MG PO TABS: 400.0000 mg | ORAL_TABLET | Freq: Two times a day (BID) | ORAL | Status: DC

## 2015-05-17 MED ORDER — INSULIN ASPART 100 UNIT/ML ~~LOC~~ SOLN: 0.0000 [IU] | Freq: Three times a day (TID) | SUBCUTANEOUS | Status: DC

## 2015-05-17 MED ORDER — ACETAMINOPHEN 650 MG RE SUPP: 650.0000 mg | Freq: Four times a day (QID) | RECTAL | Status: DC | PRN

## 2015-05-17 MED ORDER — LORAZEPAM 1 MG PO TABS: 1.0000 mg | ORAL_TABLET | Freq: Two times a day (BID) | ORAL | Status: DC

## 2015-05-17 MED ORDER — AMIODARONE IV BOLUS ONLY 150 MG/100ML
150.0000 mg | Freq: Once | INTRAVENOUS | Status: AC
  Administered 2015-05-17: 150 mg via INTRAVENOUS

## 2015-05-17 MED ORDER — METOPROLOL TARTRATE 1 MG/ML IV SOLN
INTRAVENOUS | Status: AC
  Filled 2015-05-17: qty 5

## 2015-05-17 NOTE — H&P (Signed)
Russell Ray at [place] NAME: Russell Ray    MR#:  [id number]  DATE OF BIRTH:  1970-05-08  DATE OF ADMISSION:  05/17/2015  PRIMARY CARE PHYSICIAN: Volanda Napoleon, MD   REQUESTING/REFERRING PHYSICIAN:  Dr Dahlia Client  CHIEF COMPLAINT:  Atrial FIBrillation  HISTORY OF PRESENT ILLNESS:  Russell Ray  is a 45 y.o. male with a known history of Aortic valve repair, essential hypertension, atrial fibrillation and diabetes who presents with above complaint. Patient has been in and out of atrial fibrillation over the past few weeks. Upon arrival to the emergency room his heart rates were in the 150s. He was given metoprolol and heart rate was in the 90s. He is taking amiodarone and metoprolol at home. He has also been given 0.5 mg IV digoxin and ventricular rate is now in the 90s.  PAST MEDICAL HISTORY:   Past Medical History  Diagnosis Date  . Diabetes mellitus without complication ([place])   . Hypertension   . Anxiety    Severe aortic stenosis requiring mechanical valve Atrial fibrillation PAST SURGICAL HISTORY:   Past Surgical History  Procedure Laterality Date  . Hernia repair      X3  . Cardiac catheterization Right 04/22/2015    Procedure: Right/Left Heart Cath and Coronary Angiography;  Surgeon: Dionisio David, MD;  Location: [facility];  Service: Cardiovascular;  Laterality: Right;  . Valve replacement    . Electrophysiologic study N/A 05/05/2015    Procedure: Cardioversion;  Surgeon: Dionisio David, MD;  Location: [facility];  Service: Cardiovascular;  Laterality: N/A;    SOCIAL HISTORY:   Social History  Substance Use Topics  . Smoking status: Former Smoker    Types: Cigarettes  . Smokeless tobacco: Not on file  . Alcohol Use: No    FAMILY HISTORY:   Family History  Problem Relation Age of Onset  . Hypertension Mother   . Hypertension Maternal Grandmother   . Diabetes Maternal Grandmother     DRUG ALLERGIES:   No Known Allergies   REVIEW OF SYSTEMS:  CONSTITUTIONAL: No fever, fatigue or weakness.  EYES: No blurred or double vision.  EARS, NOSE, AND THROAT: No tinnitus or ear pain.  RESPIRATORY: No cough, shortness of breath, wheezing or hemoptysis.  CARDIOVASCULAR: No chest pain, orthopnea, edema. Positive palpitations GASTROINTESTINAL: No nausea, vomiting, diarrhea or abdominal pain.  GENITOURINARY: No dysuria, hematuria.  ENDOCRINE: No polyuria, nocturia,  HEMATOLOGY: No anemia, easy bruising or bleeding SKIN: No rash or lesion. MUSCULOSKELETAL: No joint pain or arthritis.   NEUROLOGIC: No tingling, numbness, weakness.  PSYCHIATRY: Positive anxiety no depression.   MEDICATIONS AT HOME:   Prior to Admission medications   Medication Sig Start Date End Date Taking? Authorizing Provider  acetaminophen (TYLENOL) 325 MG tablet Take 650 mg by mouth every 4 (four) hours as needed for moderate pain.   Yes Historical Provider, MD  amiodarone (PACERONE) 400 MG tablet Take 1 tablet (400 mg total) by mouth 2 (two) times daily. 05/06/15  Yes Theodoro Grist, MD  cyanocobalamin 1000 MCG tablet Take 1,000 mcg by mouth daily.   Yes Historical Provider, MD  cyclobenzaprine (FLEXERIL) 5 MG tablet Take 5 mg by mouth 3 (three) times daily as needed.   Yes Historical Provider, MD  furosemide (LASIX) 40 MG tablet Take 1 tablet (40 mg total) by mouth daily. Patient taking differently: Take 40 mg by mouth 2 (two) times daily.  03/19/15  Yes Theodoro Grist, MD  KLOR-CON M20 20 MEQ tablet Take 1 tablet by mouth 2 (two) times daily. 05/02/15  Yes Historical Provider, MD  LORazepam (ATIVAN) 1 MG tablet Take 1 mg by mouth 2 (two) times daily. 03/11/15  Yes Historical Provider, MD  metFORMIN (GLUCOPHAGE) 500 MG tablet Take 500 mg by mouth 2 (two) times daily. 03/08/15  Yes Historical Provider, MD  metoprolol (LOPRESSOR) 50 MG tablet Take 1 tablet by mouth 2 (two) times daily. 05/02/15  Yes Historical Provider, MD  nicotine  (NICODERM CQ - DOSED IN MG/24 HOURS) 14 mg/24hr patch Place 14 mg onto the skin daily.   Yes Historical Provider, MD  oxyCODONE (OXY IR/ROXICODONE) 5 MG immediate release tablet Take 1 tablet by mouth every 4 (four) hours as needed. 05/02/15  Yes Historical Provider, MD  polyethylene glycol (MIRALAX / GLYCOLAX) packet Take 17 g by mouth daily as needed.   Yes Historical Provider, MD  senna-docusate (SENOKOT-S) 8.6-50 MG tablet Take 1 tablet by mouth at bedtime as needed for mild constipation. 03/19/15  Yes Theodoro Grist, MD  warfarin (COUMADIN) 2 MG tablet Take 3 tablets (6 mg total) by mouth at bedtime. 05/05/15  Yes Theodoro Grist, MD      VITAL SIGNS:  Blood pressure 125/67, pulse 103, resp. rate 15, SpO2 100 %.  PHYSICAL EXAMINATION:  GENERAL:  45 y.o.-year-old patient lying in the bed with no acute distress.  EYES: Pupils equal, round, reactive to light and accommodation. No scleral icterus. Extraocular muscles intact.  HEENT: Head atraumatic, normocephalic. Oropharynx and nasopharynx clear.  NECK:  Supple, no jugular venous distention. No thyroid enlargement, no tenderness.  LUNGS: Normal breath sounds bilaterally, no wheezing, rales,rhonchi or crepitation. No use of accessory muscles of respiration.  CARDIOVASCULAR: Irregular, irregular. No murmurs, rubs, or gallops.  ABDOMEN: Soft, nontender, nondistended. Bowel sounds present. No organomegaly or mass.  EXTREMITIES: No pedal edema, cyanosis, or clubbing.  NEUROLOGIC: Cranial nerves II through XII are grossly intact. No focal deficits. PSYCHIATRIC: The patient is alert and oriented x 3.  SKIN: No obvious rash, lesion, or ulcer.   LABORATORY PANEL:   CBC  Recent Labs Lab 05/17/15 0433  WBC 9.3  HGB 10.7*  HCT 33.3*  PLT 295   ------------------------------------------------------------------------------------------------------------------  Chemistries   Recent Labs Lab 05/17/15 0433  NA 134*  K 4.1  CL 102  CO2 24   GLUCOSE 104*  BUN 13  CREATININE 0.54*  CALCIUM 9.2  AST 21  ALT 24  ALKPHOS 83  BILITOT 0.6   ------------------------------------------------------------------------------------------------------------------  Cardiac Enzymes  Recent Labs Lab 05/17/15 0433 05/17/15 0748  TROPONINI <0.03 <0.03   ------------------------------------------------------------------------------------------------------------------  RADIOLOGY:  Dg Chest Portable 1 View  05/17/2015  CLINICAL DATA:  Left-sided chest pain radiating into the left arm. Recent cardiac surgery. EXAM: PORTABLE CHEST 1 VIEW COMPARISON:  05/03/2015 FINDINGS: There is unchanged cardiomegaly. The lungs are clear. There is no large effusion. The pulmonary vasculature is normal. There is no pneumothorax. IMPRESSION: Cardiomegaly.  No acute findings. Electronically Signed   By: Andreas Newport M.D.   On: 05/17/2015 05:00    EKG:   Atrial fibrillation heart rate with heart rate 148  IMPRESSION AND PLAN:    45 year old male with a history of aortic valve replacement for severe aortic stenosis, atrial fibrillation and diabetes who presents with A. fib RVR.  1. Atrial fibrillation and RVR: Patient's heart rate is better controlled with doxepin. Plan is for cardioversion. Appreciate cardiology consult. Continue amiodarone and metoprolol.  Monitor troponins 3. 2. Aortic valve  replacement: Coumadin management as per pharmacy consult. 3. Diabetes: Continue ADA diet, sliding scale insulin and metformin. 4. Essential hypertension: Continue metoprolol.   All the records are reviewed and case discussed with ED provider. Management plans discussed with the patient and he is in agreement.  CODE STATUS: FULL  TOTAL TIME TAKING CARE OF THIS PATIENT: 50 minutes.    Corene Resnick M.D on 05/17/2015 at 10:32 AM  Between 7am to 6pm - Pager - [phone] After 6pm go to [email or web address] - password [email or web address]   Office  [phone]  CC: Primary care physician; Volanda Napoleon, MD

## 2015-05-17 NOTE — Anesthesia Preprocedure Evaluation (Addendum)
Anesthesia Evaluation  Patient identified by MRN, date of birth, ID band Patient awake    Reviewed: Allergy & Precautions, NPO status , Patient's Chart, lab work & pertinent test results, reviewed documented beta blocker date and time   Airway Mallampati: III  TM Distance: >3 FB     Dental  (+) Chipped   Pulmonary shortness of breath and with exertion, sleep apnea and Continuous Positive Airway Pressure Ventilation , former smoker,    Pulmonary exam normal        Cardiovascular hypertension, Pt. on medications + Orthopnea  + dysrhythmias Atrial Fibrillation  Rhythm:Irregular     Neuro/Psych Anxiety negative neurological ROS     GI/Hepatic negative GI ROS, Neg liver ROS,   Endo/Other  diabetes, Well Controlled, Type 2Morbid obesity  Renal/GU negative Renal ROS  negative genitourinary   Musculoskeletal negative musculoskeletal ROS (+)   Abdominal (+) + obese,   Peds negative pediatric ROS (+)  Hematology  (+) anemia ,   Anesthesia Other Findings A fib. Valve replaacement. Smoke quit in [place].  Reproductive/Obstetrics                            Anesthesia Physical  Anesthesia Plan  ASA: III and emergent  Anesthesia Plan: General   Post-op Pain Management:    Induction: Intravenous  Airway Management Planned: Nasal Cannula  Additional Equipment:   Intra-op Plan:   Post-operative Plan:   Informed Consent: I have reviewed the patients History and Physical, chart, labs and discussed the procedure including the risks, benefits and alternatives for the proposed anesthesia with the patient or authorized representative who has indicated his/her understanding and acceptance.   Dental advisory given  Plan Discussed with: CRNA and Surgeon  Anesthesia Plan Comments:        Anesthesia Quick Evaluation

## 2015-05-17 NOTE — ED Notes (Signed)
Pt's wife came out and said pt wanted to speak with admitting MD. When questioned, pt stated he wants to leave. Pt informed that we are not holding him and that he is always free to leave, but he needs to remember why he came. Pt states that Dr. Humphrey Rolls told him he could take all meds in pill form, and he has an appt tomorrow at [facility] with his surgeon. After speaking with Dr. Benjie Karvonen, this nurse again told pt he is free to leave at any time. Pt requested recliner for comfort, which was provided by this nurse. Pt "is thinking about" whether or not to stay at this time.

## 2015-05-17 NOTE — ED Notes (Signed)
Pt heart rate in 130-140s, upon this RN entry to room pt standing up to void. Pt was informed to not stand up and notify this RN or staff when to needs to void, pt verbalized understanding. Pt informed to notify staff when needing to void and not stand up without notifying staff. Per MD order, pt is to be given 5 mg Lopressor IV. Pt refusing medicine, states "just wait and it will come down once I rest...can you wait 10 minutes."

## 2015-05-17 NOTE — ED Notes (Signed)
Patient ambulatory to triage with steady gait, without difficulty or distress noted; pt reports left sided CP radiating into left arm ; recent mech valve replacement and unsure if surg r/t but pain accomp by dizziness & SHOB; sched for CT angio today; double ds metoprolol yesterday in prep for procedure

## 2015-05-17 NOTE — Transfer of Care (Signed)
Immediate Anesthesia Transfer of Care Note  Patient: Russell Ray  Procedure(s) Performed: Procedure(s): CARDIOVERSION (N/A)  Patient Location: PACU  Anesthesia Type:General  Level of Consciousness: awake, alert  and oriented  Airway & Oxygen Therapy: Patient Spontanous Breathing and Patient connected to nasal cannula oxygen  Post-op Assessment: Report given to RN and Post -op Vital signs reviewed and stable  Post vital signs: Reviewed and stable  Last Vitals:  Filed Vitals:   05/17/15 1118 05/17/15 1119  BP: 147/67 146/60  Pulse: 85 86  Resp: 24 18    Complications: No apparent anesthesia complications

## 2015-05-17 NOTE — ED Notes (Signed)
Ok to remove pulse oximeter per dr mody

## 2015-05-17 NOTE — ED Notes (Signed)
Resumed care from [place], [place]. Pt resting comfortably.

## 2015-05-17 NOTE — ED Notes (Signed)
Pt refused metoprolol. Dr. Dahlia Client notified. Will continue to monitor patient.

## 2015-05-17 NOTE — Anesthesia Procedure Notes (Signed)
Procedure Name: MAC Performed by: Demetrius Charity Pre-anesthesia Checklist: Patient identified, Emergency Drugs available, Suction available, Patient being monitored and Timeout performed Oxygen Delivery Method: Nasal cannula Intubation Type: IV induction

## 2015-05-17 NOTE — Progress Notes (Signed)
Russell Ray is a 45 y.o. male  [id number]  Primary Cardiologist: Neoma Laming Reason for Consultation: Atrial fibrillation  HPI: This is a 45 year old white male with a past medical history of severe aortic stenosis with, presented to the emergency room with atrial fibrillation with rapid ventricular response rate. Patient states that this morning he felt lightheaded dizzy and felt like he was given a pass out and thus he came to the emergency room. His ventricular response rate was 120 he is already on by mouth amiodarone and metoprolol 100 twice a day. Patient was given 0.5 mg of digoxin IV and ventricular rate right now is 100.   Review of Systems: No chest pain no orthopnea but does feel short of breath .   Past Medical History  Diagnosis Date  . Diabetes mellitus without complication ([place])   . Hypertension   . Anxiety      (Not in a hospital admission)   . digoxin  0.5 mg Intravenous Daily    Infusions:    No Known Allergies  Social History   Social History  . Marital Status: Married    Spouse Name: N/A  . Number of Children: N/A  . Years of Education: N/A   Occupational History  . Not on file.   Social History Main Topics  . Smoking status: Former Smoker    Types: Cigarettes  . Smokeless tobacco: Not on file  . Alcohol Use: No  . Drug Use: No  . Sexual Activity: Not on file   Other Topics Concern  . Not on file   Social History Narrative    Family History  Problem Relation Age of Onset  . Hypertension Mother   . Hypertension Maternal Grandmother   . Diabetes Maternal Grandmother     PHYSICAL EXAM: Filed Vitals:   05/17/15 0700 05/17/15 0716  BP:  125/67  Pulse: 70 103  Resp: 17 15    No intake or output data in the 24 hours ending 05/17/15 0933  General:  Well appearing. No respiratory difficulty HEENT: normal Neck: supple. no JVD. Carotids 2+ bilat; no bruits. No lymphadenopathy or thryomegaly appreciated. Cor: PMI nondisplaced.  Regular rate & rhythm. No rubs, gallops or murmurs. Lungs: clear Abdomen: soft, nontender, nondistended. No hepatosplenomegaly. No bruits or masses. Good bowel sounds. Extremities: no cyanosis, clubbing, rash, edema Neuro: alert & oriented x 3, cranial nerves grossly intact. moves all 4 extremities w/o difficulty. Affect pleasant.  ECG: Atrial fibrillation with rapid ventricular response rate 140/m with ST elevation in lead 2,3 aVF but 1 mm  Results for orders placed or performed during the hospital encounter of 05/17/15 (from the past 24 hour(s))  CBC     Status: Abnormal   Collection Time: 05/17/15  4:33 AM  Result Value Ref Range   WBC 9.3 3.8 - 10.6 K/uL   RBC 4.29 (L) 4.40 - 5.90 MIL/uL   Hemoglobin 10.7 (L) 13.0 - 18.0 g/dL   HCT 33.3 (L) 40.0 - 52.0 %   MCV 77.5 (L) 80.0 - 100.0 fL   MCH 25.0 (L) 26.0 - 34.0 pg   MCHC 32.2 32.0 - 36.0 g/dL   RDW 18.6 (H) 11.5 - 14.5 %   Platelets 295 150 - 440 K/uL  Comprehensive metabolic panel     Status: Abnormal   Collection Time: 05/17/15  4:33 AM  Result Value Ref Range   Sodium 134 (L) 135 - 145 mmol/L   Potassium 4.1 3.5 - 5.1 mmol/L   Chloride  102 101 - 111 mmol/L   CO2 24 22 - 32 mmol/L   Glucose, Bld 104 (H) 65 - 99 mg/dL   BUN 13 6 - 20 mg/dL   Creatinine, Ser 0.54 (L) 0.61 - 1.24 mg/dL   Calcium 9.2 8.9 - 10.3 mg/dL   Total Protein 6.6 6.5 - 8.1 g/dL   Albumin 3.6 3.5 - 5.0 g/dL   AST 21 15 - 41 U/L   ALT 24 17 - 63 U/L   Alkaline Phosphatase 83 38 - 126 U/L   Total Bilirubin 0.6 0.3 - 1.2 mg/dL   GFR calc non Af Amer >60 >60 mL/min   GFR calc Af Amer >60 >60 mL/min   Anion gap 8 5 - 15  Troponin I     Status: None   Collection Time: 05/17/15  4:33 AM  Result Value Ref Range   Troponin I <0.03 <0.031 ng/mL  Brain natriuretic peptide     Status: Abnormal   Collection Time: 05/17/15  4:33 AM  Result Value Ref Range   B Natriuretic Peptide 177.0 (H) 0.0 - 100.0 pg/mL  Troponin I     Status: None   Collection Time:  05/17/15  7:48 AM  Result Value Ref Range   Troponin I <0.03 <0.031 ng/mL   Dg Chest Portable 1 View  05/17/2015  CLINICAL DATA:  Left-sided chest pain radiating into the left arm. Recent cardiac surgery. EXAM: PORTABLE CHEST 1 VIEW COMPARISON:  05/03/2015 FINDINGS: There is unchanged cardiomegaly. The lungs are clear. There is no large effusion. The pulmonary vasculature is normal. There is no pneumothorax. IMPRESSION: Cardiomegaly.  No acute findings. Electronically Signed   By: Andreas Newport M.D.   On: 05/17/2015 05:00   The top  ASSESSMENT AND PLAN: Atrial fibrillation with rapid ventricular response rate. Patient had electrical cardioversion and has gone back into atrial fibrillation. Patient was seen yesterday and metoprolol was increased from 100 mg a day to 200 mg a day along with amiodarone 100 mg a day but still is in A. fib with rapid ventricular response rate. Patient will be added digoxin 0.25 mg by mouth once a day. May need electrical cardioversion again.  Anitta Tenny A

## 2015-05-17 NOTE — Progress Notes (Signed)
Patient alert and oriented. In NSR, VSS. Discharge home per [facility] and hospitalist. Discharge instructions reviewed with patient and wife. Follow up appt reviewed. Discharged home via wheelchair with wife.

## 2015-05-17 NOTE — ED Notes (Signed)
EKG  Indicates STEMI; charge nurse notified & pt taken immed to room 15 via w/c by ED tech  for further eval

## 2015-05-17 NOTE — ED Notes (Signed)
Pt requesting something for pain, MD notified.

## 2015-05-17 NOTE — ED Notes (Signed)
MD Webster at bedside 

## 2015-05-17 NOTE — ED Notes (Signed)
Admitting MD at bedside.

## 2015-05-17 NOTE — Progress Notes (Signed)
  NAME:  Russell Ray   MRN: [id number] DOB:  Jul 01, 1970   ADMIT DATE: 05/17/2015  Procedure: Electrical Cardioversion Indications:  Atrial Fibrillation  Procedure Details:    Time Out: Verified patient identification, verified procedure, site/side was marked, verified correct patient position, special equipment/implants available, medications/allergies/relevent history reviewed, required imaging and test results available.    Patient placed on cardiac monitor, pulse oximetry, supplemental oxygen as necessary.  Sedation given:  Pacer pads placed   Cardioverted . Patient cardioverted to 200 J Cardioverted at 200 J synchronized cardioversion  Evaluation: Findings: Post procedure EKG shows:  Complications: No complication Patient did well. Post-cardioversion EKG shows normal sinus rhythm 88 bpm. Patient was given 150 mg bolus of amiodarone. Patient may go home early in the morning after observation and has a follow-up at [facility] postop which she should keep.     Dionisio David, M.D. [facility]   05/17/2015 1:46 PM

## 2015-05-17 NOTE — ED Notes (Signed)
Pt states his wife is going to help him use the restroom and for this nurse to step out. Wanted to let staff know so we wouldn't come running when the monitors showed increased heart rate. Pt disconnected from heart monitor momentarily. Pt requested not to have to wear pulse oximeter and was informed that he would need to wear it until admitting doc sees him since he was brought in c/o sob and he is cardiac patient.

## 2015-05-17 NOTE — Discharge Summary (Signed)
[name] NAME: Russell Ray    MR#:  [id number]  DATE OF BIRTH:  1970-03-16  DATE OF ADMISSION:  05/17/2015 ADMITTING PHYSICIAN: Dionisio David, MD  DATE OF DISCHARGE: 05/17/2015  3:00 PM  PRIMARY CARE PHYSICIAN: Volanda Napoleon, MD    ADMISSION DIAGNOSIS:  CHEST PAIN Afib  Pt in ER Rm 15 Russell Ray in [place] will Page Dr Humphrey Rolls about appt. Spoke with Russell Ray ok per Dr Andree Elk to add on  DISCHARGE DIAGNOSIS:  Active Problems:   Atrial fibrillation ([place])   SECONDARY DIAGNOSIS:   Past Medical History  Diagnosis Date  . Diabetes mellitus without complication ([place])   . Hypertension   . Anxiety   . Atrial fibrillation [facility])     HOSPITAL COURSE:  45 year old male with a history of aortic valve replacement for severe aortic stenosis, atrial fibrillation and diabetes who presents with A. fib RVR.  1. Atrial fibrillation and RVR: He underwent successful cardioversion and is stable for discharge as per Dr Humphrey Rolls. He has follow up tomorrow with [facility]. He ws given a one tome dose of Digoxin and AMIODARONE bolus. He can continue amiodarone and metoprolol.  2. Aortic valve replacement: Contniue Coumadin with GOAL INR 2.5-3.5.  3. Diabetes: Continue ADA diet and metformin. 4. Essential hypertension: Continue metoprolol.   DISCHARGE CONDITIONS AND DIET:  Stable for discharge Cardiac diet  CONSULTS OBTAINED:     DRUG ALLERGIES:  No Known Allergies  DISCHARGE MEDICATIONS:   Discharge Medication List as of 05/17/2015  2:39 PM    CONTINUE these medications which have NOT CHANGED   Details  acetaminophen (TYLENOL) 325 MG tablet Take 650 mg by mouth every 4 (four) hours as needed for moderate pain., Until Discontinued, Historical Med    amiodarone (PACERONE) 400 MG tablet Take 1 tablet (400 mg total) by mouth 2 (two) times daily., Starting 05/06/2015, Until Discontinued, Normal    cyanocobalamin 1000 MCG  tablet Take 1,000 mcg by mouth daily., Until Discontinued, Historical Med    cyclobenzaprine (FLEXERIL) 5 MG tablet Take 5 mg by mouth 3 (three) times daily as needed., Until Discontinued, Historical Med    furosemide (LASIX) 40 MG tablet Take 1 tablet (40 mg total) by mouth daily., Starting 03/19/2015, Until Discontinued, Normal    KLOR-CON M20 20 MEQ tablet Take 1 tablet by mouth 2 (two) times daily., Starting 05/02/2015, Until Discontinued, Historical Med    LORazepam (ATIVAN) 1 MG tablet Take 1 mg by mouth 2 (two) times daily., Starting 03/11/2015, Until Discontinued, Historical Med    metFORMIN (GLUCOPHAGE) 500 MG tablet Take 500 mg by mouth 2 (two) times daily., Starting 03/08/2015, Until Discontinued, Historical Med    metoprolol (LOPRESSOR) 50 MG tablet Take 1 tablet by mouth 2 (two) times daily., Starting 05/02/2015, Until Discontinued, Historical Med    nicotine (NICODERM CQ - DOSED IN MG/24 HOURS) 14 mg/24hr patch Place 14 mg onto the skin daily., Until Discontinued, Historical Med    oxyCODONE (OXY IR/ROXICODONE) 5 MG immediate release tablet Take 1 tablet by mouth every 4 (four) hours as needed., Starting 05/02/2015, Until Discontinued, Historical Med    polyethylene glycol (MIRALAX / GLYCOLAX) packet Take 17 g by mouth daily as needed., Until Discontinued, Historical Med    senna-docusate (SENOKOT-S) 8.6-50 MG tablet Take 1 tablet by mouth at bedtime as needed for mild constipation., Starting 03/19/2015, Until Discontinued, Normal    warfarin (COUMADIN) 2 MG tablet Take 3 tablets (6 mg  total) by mouth at bedtime., Starting 05/05/2015, Until Discontinued, Normal              Today   CHIEF COMPLAINT:  Doing well no palpitations or chest pain   VITAL SIGNS:  Blood pressure 143/86, Ray 84, resp. rate 17, SpO2 96 %.   REVIEW OF SYSTEMS:  Review of Systems  Constitutional: Negative for fever, chills and malaise/fatigue.  HENT: Negative for ear discharge, ear pain, hearing  loss, nosebleeds and sore throat.   Eyes: Negative for blurred vision and pain.  Respiratory: Negative for cough, hemoptysis, shortness of breath and wheezing.   Cardiovascular: Negative for chest pain, palpitations and leg swelling.  Gastrointestinal: Negative for nausea, vomiting, abdominal pain, diarrhea and blood in stool.  Genitourinary: Negative for dysuria.  Musculoskeletal: Negative for back pain.  Neurological: Negative for dizziness, tremors, speech change, focal weakness, seizures and headaches.  Endo/Heme/Allergies: Does not bruise/bleed easily.  Psychiatric/Behavioral: Negative for depression, suicidal ideas and hallucinations.     PHYSICAL EXAMINATION:  GENERAL:  45 y.o.-year-old patient lying in the bed with no acute distress.  NECK:  Supple, no jugular venous distention. No thyroid enlargement, no tenderness.  LUNGS: Normal breath sounds bilaterally, no wheezing, rales,rhonchi  No use of accessory muscles of respiration.  CARDIOVASCULAR: S1, S2 normal. No murmurs, rubs, or gallops.  Staples and sutures clean and intact ABDOMEN: Soft, non-tender, non-distended. Bowel sounds present. No organomegaly or mass.  EXTREMITIES: No pedal edema, cyanosis, or clubbing.  PSYCHIATRIC: The patient is alert and oriented x 3.  SKIN: No obvious rash, lesion, or ulcer.   DATA REVIEW:   CBC  Recent Labs Lab 05/17/15 0433  WBC 9.3  HGB 10.7*  HCT 33.3*  PLT 295    Chemistries   Recent Labs Lab 05/17/15 0433  NA 134*  K 4.1  CL 102  CO2 24  GLUCOSE 104*  BUN 13  CREATININE 0.54*  CALCIUM 9.2  AST 21  ALT 24  ALKPHOS 83  BILITOT 0.6    Cardiac Enzymes  Recent Labs Lab 05/17/15 0433 05/17/15 0748  TROPONINI <0.03 <0.03    Microbiology Results  @[email or web address] @  RADIOLOGY:  Dg Chest Portable 1 View  05/17/2015  CLINICAL DATA:  Left-sided chest pain radiating into the left arm. Recent cardiac surgery. EXAM: PORTABLE CHEST 1 VIEW COMPARISON:  05/03/2015  FINDINGS: There is unchanged cardiomegaly. The lungs are clear. There is no large effusion. The pulmonary vasculature is normal. There is no pneumothorax. IMPRESSION: Cardiomegaly.  No acute findings. Electronically Signed   By: Andreas Newport M.D.   On: 05/17/2015 05:00      Management plans discussed with the patient and he is in agreement. Stable for discharge home  Patient should follow up with [facility] tomorrow  CODE STATUS:     Code Status Orders        Start     Ordered   05/17/15 0958  Full code   Continuous     05/17/15 0957    Code Status History    Date Active Date Inactive Code Status Order ID Comments User Context   05/04/2015  9:38 AM 05/05/2015  8:39 PM Full Code [id number]  Daune Perch, NP Inpatient   05/03/2015  5:34 PM 05/04/2015  9:38 AM Full Code [id number]  Theodoro Grist, MD ED   04/22/2015  7:37 AM 04/22/2015  9:30 PM Full Code [id number]  Lytle Butte, MD ED   03/18/2015  2:14 PM 03/19/2015  3:15 PM Full Code [id number]  Fritzi Mandes, MD Inpatient      TOTAL TIME TAKING CARE OF THIS PATIENT: 35 minutes.    Note: This dictation was prepared with Dragon dictation along with smaller phrase technology. Any transcriptional errors that result from this process are unintentional.  Sierah Lacewell M.D on 05/17/2015 at 4:50 PM  Between 7am to 6pm - Pager - [phone] After 6pm go to [email or web address] - password [email or web address] [facility]  Office  [phone]  CC: Primary care physician; Volanda Napoleon, MD

## 2015-05-17 NOTE — ED Provider Notes (Signed)
[facility] Emergency Department Provider Note  ____________________________________________  Time seen: Approximately 63 AM  I have reviewed the triage vital signs and the nursing notes.   HISTORY  Chief Complaint Chest Pain    HPI Russell Ray is a 45 y.o. male who comes into the hospital with chest pain and shortness of breath. The patient had an aortic valve replacement 3 weeks ago at [facility]. He reports that when he left he was having some problems with atrial fibrillation. The patient saw Dr. Humphrey Rolls yesterdayand was told that they would try to do an angiogram today. He reports that his heart rates have been as fast as 130s to 140s. He reports that when he was at [place] is treated in the symptoms went away but whenever he would stand or move his heart rate would go right back up. The patient reports he's also been shocked to try to get his rhythm stabilized. He reports that he is taking amiodarone and metoprolol and recently increase his metoprolol from 50 mg twice a day 200 mg twice a day. The patient has had some midline chest pain from his sternotomy incision but his chest pain moved today to the left side of his chest. The patient also reports that he started having some shortness of breath. He's had some nausea as well as vomiting and sweats. The patient was scared due to the sweats and the shortness of breath he decided to come in and get checked out today. Patient rates his pain as an 8 out of 10 in intensity.   Past Medical History  Diagnosis Date  . Diabetes mellitus without complication ([place])   . Hypertension   . Anxiety     Patient Active Problem List   Diagnosis Date Noted  . Elevated troponin 05/05/2015  . Obesity 05/05/2015  . Obstructive sleep apnea 05/05/2015  . Hyponatremia 05/05/2015  . Leukocytosis 05/05/2015  . Anemia 05/05/2015  . Anticoagulated on Coumadin 05/05/2015  . H/O aortic valve replacement 05/05/2015  . A-fib ([place]) 05/03/2015   . Atrial fibrillation with RVR ([place]) 05/03/2015  . Chest pain, central 04/22/2015  . Morbid obesity ([place]) 03/19/2015  . Leg swelling 03/19/2015  . Orthopnea 03/19/2015  . Tobacco abuse 03/19/2015    Past Surgical History  Procedure Laterality Date  . Hernia repair      X3  . Cardiac catheterization Right 04/22/2015    Procedure: Right/Left Heart Cath and Coronary Angiography;  Surgeon: Dionisio David, MD;  Location: [facility];  Service: Cardiovascular;  Laterality: Right;  . Valve replacement    . Electrophysiologic study N/A 05/05/2015    Procedure: Cardioversion;  Surgeon: Dionisio David, MD;  Location: [facility] ORS;  Service: Cardiovascular;  Laterality: N/A;    Current Outpatient Rx  Name  Route  Sig  Dispense  Refill  . acetaminophen (TYLENOL) 325 MG tablet   Oral   Take 650 mg by mouth every 4 (four) hours as needed for moderate pain.         Marland Kitchen amiodarone (PACERONE) 400 MG tablet   Oral   Take 1 tablet (400 mg total) by mouth 2 (two) times daily.   60 tablet   2   . cyanocobalamin 1000 MCG tablet   Oral   Take 1,000 mcg by mouth daily.         . cyclobenzaprine (FLEXERIL) 5 MG tablet   Oral   Take 5 mg by mouth 3 (three) times daily as needed.         Marland Kitchen  furosemide (LASIX) 40 MG tablet   Oral   Take 1 tablet (40 mg total) by mouth daily. Patient taking differently: Take 40 mg by mouth 2 (two) times daily.    30 tablet   6   . KLOR-CON M20 20 MEQ tablet   Oral   Take 1 tablet by mouth 2 (two) times daily.           Dispense as written.   Marland Kitchen LORazepam (ATIVAN) 1 MG tablet   Oral   Take 1 mg by mouth 2 (two) times daily.      2   . metFORMIN (GLUCOPHAGE) 500 MG tablet   Oral   Take 500 mg by mouth 2 (two) times daily.      0   . metoprolol (LOPRESSOR) 50 MG tablet   Oral   Take 1 tablet by mouth 2 (two) times daily.         . nicotine (NICODERM CQ - DOSED IN MG/24 HOURS) 14 mg/24hr patch   Transdermal   Place 14 mg onto the skin  daily.         Marland Kitchen oxyCODONE (OXY IR/ROXICODONE) 5 MG immediate release tablet   Oral   Take 1 tablet by mouth every 4 (four) hours as needed.         . polyethylene glycol (MIRALAX / GLYCOLAX) packet   Oral   Take 17 g by mouth daily as needed.         . senna-docusate (SENOKOT-S) 8.6-50 MG tablet   Oral   Take 1 tablet by mouth at bedtime as needed for mild constipation.   30 tablet   6   . warfarin (COUMADIN) 2 MG tablet   Oral   Take 3 tablets (6 mg total) by mouth at bedtime.   90 tablet   5     Allergies Review of patient's allergies indicates no known allergies.  Family History  Problem Relation Age of Onset  . Hypertension Mother   . Hypertension Maternal Grandmother   . Diabetes Maternal Grandmother     Social History Social History  Substance Use Topics  . Smoking status: Former Smoker    Types: Cigarettes  . Smokeless tobacco: Not on file  . Alcohol Use: No    Review of Systems Constitutional: No fever/chills Eyes: No visual changes. ENT: No sore throat. Cardiovascular: chest pain and palpitations Respiratory: shortness of breath. Gastrointestinal: Nausea and vomiting with no abdominal pain. Genitourinary: Negative for dysuria. Musculoskeletal: Negative for back pain. Skin: Negative for rash. Neurological: Negative for headaches, focal weakness or numbness.  10-point ROS otherwise negative.  ____________________________________________   PHYSICAL EXAM:  VITAL SIGNS: ED Triage Vitals  Enc Vitals Group     BP 05/17/15 0422 122/82 mmHg     Pulse Rate 05/17/15 0422 125     Resp 05/17/15 0422 15     Temp --      Temp src --      SpO2 05/17/15 0422 98 %     Weight --      Height --      Head Cir --      Peak Flow --      Pain Score 05/17/15 0411 8     Pain Loc --      Pain Edu? --      Excl. in [place]? --     Constitutional: Alert and oriented. Well appearing and in mild to moderate distress. Eyes: Conjunctivae are normal. PERRL.  EOMI. Head:  Atraumatic. Nose: No congestion/rhinnorhea. Mouth/Throat: Mucous membranes are moist.  Oropharynx non-erythematous. Cardiovascular: Tachycardic, irregularly irregular rhythm. Clicking of artificial heart valve auscultated  Good peripheral circulation. Respiratory: Normal respiratory effort.  No retractions. Lungs CTAB. Gastrointestinal: Soft and nontender. No distention.  Musculoskeletal: No lower extremity tenderness nor edema.   Neurologic:  Normal speech and language.  Skin:  Skin is warm, dry and intact.  Psychiatric: Mood and affect are normal. .  ____________________________________________   LABS (all labs ordered are listed, but only abnormal results are displayed)  Labs Reviewed  CBC - Abnormal; Notable for the following:    RBC 4.29 (*)    Hemoglobin 10.7 (*)    HCT 33.3 (*)    MCV 77.5 (*)    MCH 25.0 (*)    RDW 18.6 (*)    All other components within normal limits  COMPREHENSIVE METABOLIC PANEL - Abnormal; Notable for the following:    Sodium 134 (*)    Glucose, Bld 104 (*)    Creatinine, Ser 0.54 (*)    All other components within normal limits  BRAIN NATRIURETIC PEPTIDE - Abnormal; Notable for the following:    B Natriuretic Peptide 177.0 (*)    All other components within normal limits  TROPONIN I  TROPONIN I   ____________________________________________  EKG  ED ECG REPORT #1 I, Loney Hering, the attending physician, personally viewed and interpreted this ECG.   Date: 05/17/2015  EKG Time: 411  Rate: 140  Rhythm: atrial fibrillation, rate 140  Axis: normal  Intervals:none  ST&T Change: no STEMI   ED ECG REPORT #2 I, Loney Hering, the attending physician, personally viewed and interpreted this ECG.   Date: 05/17/2015  EKG Time: 420  Rate: 128  Rhythm: atrial fibrillation, rate 128  Axis: normal  Intervals:none  ST&T Change: none   ____________________________________________  RADIOLOGY  Chest x-ray:  Cardiomegaly, no acute findings ____________________________________________   PROCEDURES  Procedure(s) performed: None  Critical Care performed: Yes, see critical care note(s)  CRITICAL CARE Performed by: Charlesetta Ivory P   Total critical care time: 30 minutes  Critical care time was exclusive of separately billable procedures and treating other patients.  Critical care was necessary to treat or prevent imminent or life-threatening deterioration.  Critical care was time spent personally by me on the following activities: development of treatment plan with patient and/or surrogate as well as nursing, discussions with consultants, evaluation of patient's response to treatment, examination of patient, obtaining history from patient or surrogate, ordering and performing treatments and interventions, ordering and review of laboratory studies, ordering and review of radiographic studies, pulse oximetry and re-evaluation of patient's condition.  ____________________________________________   INITIAL IMPRESSION / ASSESSMENT AND PLAN / ED COURSE  Pertinent labs & imaging results that were available during my care of the patient were reviewed by me and considered in my medical decision making (see chart for details).  This is a 45 year old male who comes into the hospital today with some chest pain shortness of breath. The patient's initial EKG read as STEMI but the patient does not have any ST segment elevation on his EKG. The patient does have some atrophic relation with rapid ventricular response. When the patient sat down it did give him a dose of Lopressor and his heart rate improved. He did then stand back up as heart rate back up into the 140s. I subsequently offered a second dose but the patient refused to take it. The patient was given  2 mg of morphine for his pain and his heart rate stayed in the 1 teens. As he stood up again is heart related back up into the 130s and remain there  even after he sat back down so he did receive a second dose of Lopressor 5 mg. The patient will be admitted to the hospitalist service for further evaluation of his atrophic relation with rapid ventricular response as well as his chest pain and shortness of breath. The patient's BNP is negative at his initial troponin is normal. ____________________________________________   FINAL CLINICAL IMPRESSION(S) / ED DIAGNOSES  Final diagnoses:  Atrial fibrillation with rapid ventricular response ([place])  Chest pain, unspecified chest pain type      Loney Hering, MD 05/17/15 [phone]

## 2015-05-17 NOTE — Anesthesia Postprocedure Evaluation (Signed)
Anesthesia Post Note  Patient: Russell Ray  Procedure(s) Performed: Procedure(s) (LRB): CARDIOVERSION (N/A)  Patient location during evaluation: PACU Anesthesia Type: General Level of consciousness: awake and alert and oriented Pain management: pain level controlled Vital Signs Assessment: post-procedure vital signs reviewed and stable Respiratory status: spontaneous breathing Cardiovascular status: blood pressure returned to baseline Anesthetic complications: no    Last Vitals:  Filed Vitals:   05/17/15 1144 05/17/15 1145  BP: 132/61 163/83  Pulse: 80 84  Resp: 23 19    Last Pain:  Filed Vitals:   05/17/15 1146  PainSc: 6                  Sentoria Brent

## 2015-08-04 ENCOUNTER — Emergency Department
Admission: EM | Admit: 2015-08-04 | Discharge: 2015-08-04 | Disposition: A | Discharge status: Home / Self Care | Payer: 59 | Attending: Emergency Medicine | Admitting: Emergency Medicine

## 2015-08-04 DIAGNOSIS — Z7901 Long term (current) use of anticoagulants: Secondary | ICD-10-CM | POA: Diagnosis not present

## 2015-08-04 DIAGNOSIS — E119 Type 2 diabetes mellitus without complications: Secondary | ICD-10-CM | POA: Diagnosis not present

## 2015-08-04 DIAGNOSIS — R531 Weakness: Secondary | ICD-10-CM | POA: Diagnosis not present

## 2015-08-04 DIAGNOSIS — Z79899 Other long term (current) drug therapy: Secondary | ICD-10-CM | POA: Diagnosis not present

## 2015-08-04 DIAGNOSIS — R197 Diarrhea, unspecified: Secondary | ICD-10-CM | POA: Insufficient documentation

## 2015-08-04 DIAGNOSIS — Z87891 Personal history of nicotine dependence: Secondary | ICD-10-CM | POA: Diagnosis not present

## 2015-08-04 DIAGNOSIS — R110 Nausea: Secondary | ICD-10-CM | POA: Insufficient documentation

## 2015-08-04 DIAGNOSIS — Z7984 Long term (current) use of oral hypoglycemic drugs: Secondary | ICD-10-CM | POA: Diagnosis not present

## 2015-08-04 DIAGNOSIS — I10 Essential (primary) hypertension: Secondary | ICD-10-CM | POA: Diagnosis not present

## 2015-08-04 DIAGNOSIS — I4891 Unspecified atrial fibrillation: Secondary | ICD-10-CM | POA: Diagnosis not present

## 2015-08-04 DIAGNOSIS — Z952 Presence of prosthetic heart valve: Secondary | ICD-10-CM | POA: Diagnosis not present

## 2015-08-04 DIAGNOSIS — R42 Dizziness and giddiness: Secondary | ICD-10-CM | POA: Diagnosis present

## 2015-08-04 LAB — COMPREHENSIVE METABOLIC PANEL
ALBUMIN: 4.2 g/dL (ref 3.5–5.0)
ALK PHOS: 61 U/L (ref 38–126)
ALT: 23 U/L (ref 17–63)
ANION GAP: 9 (ref 5–15)
AST: 21 U/L (ref 15–41)
BILIRUBIN TOTAL: 0.4 mg/dL (ref 0.3–1.2)
BUN: 7 mg/dL (ref 6–20)
CALCIUM: 9.3 mg/dL (ref 8.9–10.3)
CO2: 24 mmol/L (ref 22–32)
Chloride: 103 mmol/L (ref 101–111)
Creatinine, Ser: 0.54 mg/dL — ABNORMAL LOW (ref 0.61–1.24)
GFR calc Af Amer: >60 mL/min (ref >60)
GFR calc non Af Amer: >60 mL/min (ref >60)
GLUCOSE: 109 mg/dL — AB (ref 65–99)
Potassium: 3 mmol/L — ABNORMAL LOW (ref 3.5–5.1)
SODIUM: 136 mmol/L (ref 135–145)
TOTAL PROTEIN: 7.3 g/dL (ref 6.5–8.1)

## 2015-08-04 LAB — CBC
HEMATOCRIT: 34.6 % — AB (ref 40.0–52.0)
HEMOGLOBIN: 11.3 g/dL — AB (ref 13.0–18.0)
MCH: 24.8 pg — ABNORMAL LOW (ref 26.0–34.0)
MCHC: 32.8 g/dL (ref 32.0–36.0)
MCV: 75.7 fL — AB (ref 80.0–100.0)
Platelets: 255 10*3/uL (ref 150–440)
RBC: 4.57 MIL/uL (ref 4.40–5.90)
RDW: 20.5 % — ABNORMAL HIGH (ref 11.5–14.5)
WBC: 9.3 10*3/uL (ref 3.8–10.6)

## 2015-08-04 LAB — PROTIME-INR
INR: 2.49
Prothrombin Time: 26.6 seconds — ABNORMAL HIGH (ref 11.4–15.0)

## 2015-08-04 LAB — TROPONIN I: Troponin I: <0.03 ng/mL (ref <0.031)

## 2015-08-04 MED ORDER — POTASSIUM CHLORIDE CRYS ER 20 MEQ PO TBCR
40.0000 meq | EXTENDED_RELEASE_TABLET | Freq: Once | ORAL | Status: AC
  Administered 2015-08-04: 40 meq via ORAL
  Filled 2015-08-04: qty 2

## 2015-08-04 MED ORDER — ONDANSETRON HCL 4 MG/2ML IJ SOLN
4.0000 mg | Freq: Once | INTRAMUSCULAR | Status: AC
  Administered 2015-08-04: 4 mg via INTRAVENOUS
  Filled 2015-08-04: qty 2

## 2015-08-04 MED ORDER — SODIUM CHLORIDE 0.9 % IV BOLUS (SEPSIS): 500.0000 mL | Freq: Once | INTRAVENOUS | Status: DC

## 2015-08-04 NOTE — ED Notes (Signed)
MD at bedside. 

## 2015-08-04 NOTE — ED Provider Notes (Signed)
[facility] Emergency Department Provider Note   ____________________________________________  Time seen: Approximately 28 PM  I have reviewed the triage vital signs and the nursing notes.   HISTORY  Chief Complaint Dizziness   HPI Russell Ray is a 45 y.o. male with a recent valve replacement at [facility] as well as hardware infection with hardware removal who is presenting to the emergency department today with nausea, lightheadedness as well as diarrhea. He says that his diarrhea has been ongoing over the past 2 weeks. He denies any blood in his stool. Says that he has been having episodes where he becomes lightheaded and dizzy with some mild blurred vision over the past week. He says that he has gone back to work for the first time after several months and is working 11-12 hour days. 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 Says that he has not been eating and drinking as normal. Says that when he is sitting still and resting that he is asymptomatic. Became concerned today because he had an episode that lasted for about 45 minutes. Says that he can also feel his bones "clicking" in his chest. He says that after the hardware removal that he is concerned about the bones "floating" and causing bleeding. Does not report any recent antibiotics. Wife had a viral upper respiratory infection recently which he thought this may have been from. Patient said that he just had an echo last week as well as blood work which he said appeared normal and that he will be following up at [facility] with his cardiologist in 3 weeks. Patient denies any palpitations.  Past Medical History  Diagnosis Date  . Diabetes mellitus without complication ([place])   . Hypertension   . Anxiety   . Atrial fibrillation [facility])     Patient Active Problem List   Diagnosis Date Noted  . Atrial fibrillation ([place]) 05/17/2015  . Elevated troponin 05/05/2015  . Obesity 05/05/2015  . Obstructive  sleep apnea 05/05/2015  . Hyponatremia 05/05/2015  . Leukocytosis 05/05/2015  . Anemia 05/05/2015  . Anticoagulated on Coumadin 05/05/2015  . H/O aortic valve replacement 05/05/2015  . A-fib ([place]) 05/03/2015  . Atrial fibrillation with RVR ([place]) 05/03/2015  . Chest pain, central 04/22/2015  . Morbid obesity ([place]) 03/19/2015  . Leg swelling 03/19/2015  . Orthopnea 03/19/2015  . Tobacco abuse 03/19/2015    Past Surgical History  Procedure Laterality Date  . Hernia repair      X3  . Cardiac catheterization Right 04/22/2015    Procedure: Right/Left Heart Cath and Coronary Angiography;  Surgeon: Dionisio David, MD;  Location: [facility];  Service: Cardiovascular;  Laterality: Right;  . Valve replacement    . Electrophysiologic study N/A 05/05/2015    Procedure: Cardioversion;  Surgeon: Dionisio David, MD;  Location: [facility] ORS;  Service: Cardiovascular;  Laterality: N/A;  . Electrophysiologic study N/A 05/17/2015    Procedure: CARDIOVERSION;  Surgeon: Dionisio David, MD;  Location: [facility] ORS;  Service: Cardiovascular;  Laterality: N/A;    Current Outpatient Rx  Name  Route  Sig  Dispense  Refill  . acetaminophen (TYLENOL) 325 MG tablet   Oral   Take 650 mg by mouth every 4 (four) hours as needed for moderate pain.         Marland Kitchen amiodarone (PACERONE) 400 MG tablet   Oral   Take 1 tablet (400 mg total) by mouth 2 (two) times daily.  60 tablet   2   . cyanocobalamin 1000 MCG tablet   Oral   Take 1,000 mcg by mouth daily.         . cyclobenzaprine (FLEXERIL) 5 MG tablet   Oral   Take 5 mg by mouth 3 (three) times daily as needed.         . furosemide (LASIX) 40 MG tablet   Oral   Take 1 tablet (40 mg total) by mouth daily. Patient taking differently: Take 40 mg by mouth 2 (two) times daily.    30 tablet   6   . KLOR-CON M20 20 MEQ tablet   Oral   Take 1 tablet by mouth 2 (two) times daily.           Dispense as written.   Marland Kitchen LORazepam (ATIVAN) 1 MG tablet    Oral   Take 1 mg by mouth 2 (two) times daily.      2   . metFORMIN (GLUCOPHAGE) 500 MG tablet   Oral   Take 500 mg by mouth 2 (two) times daily.      0   . metoprolol (LOPRESSOR) 50 MG tablet   Oral   Take 1 tablet by mouth 2 (two) times daily.         . nicotine (NICODERM CQ - DOSED IN MG/24 HOURS) 14 mg/24hr patch   Transdermal   Place 14 mg onto the skin daily.         Marland Kitchen oxyCODONE (OXY IR/ROXICODONE) 5 MG immediate release tablet   Oral   Take 1 tablet by mouth every 4 (four) hours as needed.         . polyethylene glycol (MIRALAX / GLYCOLAX) packet   Oral   Take 17 g by mouth daily as needed.         . senna-docusate (SENOKOT-S) 8.6-50 MG tablet   Oral   Take 1 tablet by mouth at bedtime as needed for mild constipation.   30 tablet   6   . warfarin (COUMADIN) 2 MG tablet   Oral   Take 3 tablets (6 mg total) by mouth at bedtime.   90 tablet   5     Allergies Review of patient's allergies indicates no known allergies.  Family History  Problem Relation Age of Onset  . Hypertension Mother   . Hypertension Maternal Grandmother   . Diabetes Maternal Grandmother     Social History Social History  Substance Use Topics  . Smoking status: Former Smoker    Types: Cigarettes  . Smokeless tobacco: Not on file  . Alcohol Use: No    Review of Systems Constitutional: No fever/chills Eyes: No visual changes. ENT: No sore throat. Cardiovascular: Denies chest pain. Respiratory: Denies shortness of breath. Gastrointestinal: No abdominal pain.  No constipation. Genitourinary: Negative for dysuria. Musculoskeletal: Negative for back pain. Skin: Negative for rash. Neurological: Negative for headaches, focal weakness or numbness.  10-point ROS otherwise negative.  ____________________________________________   PHYSICAL EXAM:  VITAL SIGNS: ED Triage Vitals  Enc Vitals Group     BP 08/04/15 1919 152/73 mmHg     Pulse Rate 08/04/15 1919 71      Resp 08/04/15 1919 18     Temp 08/04/15 1919 97.8 F (36.6 C)     Temp Source 08/04/15 1919 Oral     SpO2 08/04/15 1919 98 %     Weight 08/04/15 1919 390 lb (176.903 kg)     Height 08/04/15 1919 6' (1.829  m)     Head Cir --      Peak Flow --      Pain Score 08/04/15 1920 0     Pain Loc --      Pain Edu? --      Excl. in [place]? --     Constitutional: Alert and oriented. Well appearing and in no acute distress. Eyes: Conjunctivae are normal. PERRL. EOMI. Head: Atraumatic. Nose: No congestion/rhinnorhea. Mouth/Throat: Mucous membranes are moist.   Neck: No stridor.   Cardiovascular: Normal rate, regular rhythm.Audible click from mechanical valve.   Respiratory: Normal respiratory effort.  No retractions. Lungs CTAB. Gastrointestinal: Soft and nontender. No distention. No abdominal bruits. No CVA tenderness. Musculoskeletal: No lower extremity tenderness nor edema.  No joint effusions. Neurologic:  Normal speech and language. No gross focal neurologic deficits are appreciated.  Skin:  Skin is warm, dry and intact. No rash noted. Well-healing scar from the chest down to the mid abdomen in the midline. Psychiatric: Mood and affect are normal. Speech and behavior are normal.  ____________________________________________   LABS (all labs ordered are listed, but only abnormal results are displayed)  Labs Reviewed  CBC - Abnormal; Notable for the following:    Hemoglobin 11.3 (*)    HCT 34.6 (*)    MCV 75.7 (*)    MCH 24.8 (*)    RDW 20.5 (*)    All other components within normal limits  COMPREHENSIVE METABOLIC PANEL - Abnormal; Notable for the following:    Potassium 3.0 (*)    Glucose, Bld 109 (*)    Creatinine, Ser 0.54 (*)    All other components within normal limits  PROTIME-INR - Abnormal; Notable for the following:    Prothrombin Time 26.6 (*)    All other components within normal limits  TROPONIN I   ____________________________________________  EKG  ED ECG  REPORT I, Doran Stabler, the attending physician, personally viewed and interpreted this ECG.   Date: 08/04/2015  EKG Time: 1915  Rate: 79  Rhythm: normal sinus rhythm  Axis: Normal axis  Intervals:none  ST&T Change: No ST segment elevation or depression. T-wave inversions in 1 as well as aVL. Morphology without significant change from EKGs done in March of this year. ____________________________________________  RADIOLOGY   ____________________________________________   PROCEDURES   ____________________________________________   INITIAL IMPRESSION / ASSESSMENT AND PLAN / ED COURSE  Pertinent labs & imaging results that were available during my care of the patient were reviewed by me and considered in my medical decision making (see chart for details).  ----------------------------------------- 9:40 PM on 08/04/2015 -----------------------------------------  Patient with a therapeutic INR. I feel it is much less likely that we would be having an stroke with this INR 2.49. Feel that his symptoms are most likely related to fatigue and that he has put himself back in his work schedule that he was doing prior to surgery. 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 I'll be giving him a work note for tomorrow and he'll be resting and drinking plenty of fluids. ____________________________________________   FINAL CLINICAL IMPRESSION(S) / ED DIAGNOSES  Weakness. Nausea.    NEW MEDICATIONS STARTED DURING THIS VISIT:  New Prescriptions   No medications on file     Note:  This document was prepared using Dragon voice recognition  software and may include unintentional dictation errors.    Orbie Pyo, MD 08/04/15 2141

## 2015-08-04 NOTE — ED Notes (Addendum)
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   Pt started Monday, since then dizziness, nausea, diarrhea (diarrhea x 2 weeks). SOB after work and fatigue after work today. Pt states he is on a blood thinner.

## 2015-08-04 NOTE — ED Notes (Signed)
Pt in with co dizziness, nausea, diarrhea, and abd pain for a week.  States had valve replacement in feb was cleared to go to work this week.

## 2015-08-25 ENCOUNTER — Encounter: Payer: 59 | Attending: Cardiovascular Disease | Admitting: *Deleted

## 2015-08-25 VITALS — Ht 71.0 in | Wt 392.8 lb

## 2015-08-25 DIAGNOSIS — Z952 Presence of prosthetic heart valve: Secondary | ICD-10-CM | POA: Diagnosis present

## 2015-08-25 NOTE — Progress Notes (Signed)
Cardiac Individual Treatment Plan  Patient Details  Name: Russell Ray MRN: [id number] Date of Birth: 05-29-1970 Referring Provider:        Cardiac Rehab from 08/25/2015 in [facility] Cardiac and Pulmonary Rehab   Referring Provider  Neoma Laming      Initial Encounter Date:       Cardiac Rehab from 08/25/2015 in [facility] Cardiac and Pulmonary Rehab   Date  08/25/15   Referring Provider  Neoma Laming      Visit Diagnosis: S/P aortic valve replacement  Patient's Home Medications on Admission:  Current outpatient prescriptions:  .  acetaminophen (TYLENOL) 325 MG tablet, Take 650 mg by mouth every 4 (four) hours as needed for moderate pain., Disp: , Rfl:  .  cyanocobalamin 1000 MCG tablet, Take 1,000 mcg by mouth daily., Disp: , Rfl:  .  LORazepam (ATIVAN) 1 MG tablet, Take 1 mg by mouth 2 (two) times daily., Disp: , Rfl: 2 .  metFORMIN (GLUCOPHAGE) 500 MG tablet, Take 500 mg by mouth 2 (two) times daily., Disp: , Rfl: 0 .  warfarin (COUMADIN) 2 MG tablet, Take 3 tablets (6 mg total) by mouth at bedtime., Disp: 90 tablet, Rfl: 5 .  amiodarone (PACERONE) 400 MG tablet, Take 1 tablet (400 mg total) by mouth 2 (two) times daily., Disp: 60 tablet, Rfl: 2 .  aspirin EC 81 MG tablet, Take by mouth., Disp: , Rfl:  .  cyclobenzaprine (FLEXERIL) 5 MG tablet, Take 5 mg by mouth 3 (three) times daily as needed., Disp: , Rfl:  .  furosemide (LASIX) 40 MG tablet, Take 1 tablet (40 mg total) by mouth daily. (Patient taking differently: Take 40 mg by mouth 2 (two) times daily. ), Disp: 30 tablet, Rfl: 6 .  KLOR-CON M20 20 MEQ tablet, Take 1 tablet by mouth 2 (two) times daily., Disp: , Rfl:  .  metoprolol (LOPRESSOR) 50 MG tablet, Take 1 tablet by mouth 2 (two) times daily., Disp: , Rfl:  .  nicotine (NICODERM CQ - DOSED IN MG/24 HOURS) 14 mg/24hr patch, Place 14 mg onto the skin daily., Disp: , Rfl:  .  oxyCODONE (OXY IR/ROXICODONE) 5 MG immediate release tablet, Take 1 tablet by mouth every 4 (four) hours as  needed., Disp: , Rfl:  .  polyethylene glycol (MIRALAX / GLYCOLAX) packet, Take 17 g by mouth daily as needed., Disp: , Rfl:  .  senna-docusate (SENOKOT-S) 8.6-50 MG tablet, Take 1 tablet by mouth at bedtime as needed for mild constipation., Disp: 30 tablet, Rfl: 6  Past Medical History: Past Medical History  Diagnosis Date  . Diabetes mellitus without complication ([place])   . Hypertension   . Anxiety   . Atrial fibrillation ([facility])     Tobacco Use: History  Smoking status  . Former Smoker  . Types: Cigarettes  Smokeless tobacco  . Not on file    Labs: Recent Review Flowsheet Data    Labs for ITP Cardiac and Pulmonary Rehab Latest Ref Rng 03/18/2015 04/22/2015 05/03/2015   Hemoglobin A1c 4.0 - 6.0 % 5.7 5.8 6.2(H)       Exercise Target Goals: Date: 08/25/15  Exercise Program Goal: Individual exercise prescription set with THRR, safety & activity barriers. Participant demonstrates ability to understand and report RPE using BORG scale, to self-measure pulse accurately, and to acknowledge the importance of the exercise prescription.  Exercise Prescription Goal: Starting with aerobic activity 30 plus minutes a day, 3 days per week for initial exercise prescription. Provide home exercise prescription and guidelines  that participant acknowledges understanding prior to discharge.  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    Cardiac Risk Stratification High      6 Minute Walk:     6 Minute Walk      08/25/15 1424       6 Minute Walk   Distance 1325 feet     Walk Time 6 minutes     MPH 2.5     METS 3.35     RPE 13     VO2 Peak 11.7     Symptoms No     Resting HR 93 bpm     Resting BP 142/68 mmHg     Max Ex. HR 110 bpm     Max Ex. BP 198/104 mmHg     2 Minute  Post BP 150/80 mmHg        Initial Exercise Prescription:     Initial Exercise Prescription - 08/25/15 1400    Date of Initial Exercise RX and Referring Provider   Date 08/25/15   Referring Provider Neoma Laming   Treadmill   MPH 2.5   Grade 0   Minutes 15   METs 2.9   Recumbant Bike   Level 3   RPM 50   Watts 75   Minutes 15   REL-XR   Level 3   Watts 75   Minutes 15   METs 2.8   T5 Nustep   Level 3   Watts 75   Minutes 15   METs 2.8   Prescription Details   Frequency (times per week) 3   Duration Progress to 30 minutes of continuous aerobic without signs/symptoms of physical distress   Intensity   THRR 40-80% of Max Heartrate 126-160   Ratings of Perceived Exertion 11-13   Perceived Dyspnea 0-4   Progression   Progression Continue to progress workloads to maintain intensity without signs/symptoms of physical distress.   Resistance Training   Training Prescription Yes   Weight 7  lifting restriction 30 lb   Reps 10-15      Perform Capillary Blood Glucose checks as needed.  Exercise Prescription Changes:   Exercise Comments:   Discharge Exercise Prescription (Final Exercise Prescription Changes):   Nutrition:  Target Goals: Understanding of nutrition guidelines, daily intake of sodium <1536m, cholesterol <2096m calories 30% from fat and 7% or less from saturated fats, daily to have 5 or more servings of fruits and vegetables.  Biometrics:     Pre Biometrics - 08/25/15 1420    Pre Biometrics   Height 5' 11" (1.803 m)   Weight (!) 392 lb 12.8 oz (178.173 kg)   Waist Circumference 63 inches   Hip Circumference 60 inches   Waist to Hip Ratio 1.05 %   BMI (Calculated) 54.9       Nutrition Therapy Plan and Nutrition Goals:     Nutrition Therapy & Goals - 08/25/15 1256    Nutrition Therapy   Drug/Food Interactions Coumadin/Vit K   Personal Nutrition Goals   Personal Goal #1 Diabetic diet, Coumadin   Intervention Plan   Intervention  Prescribe, educate and counsel regarding individualized specific dietary modifications aiming towards targeted core components such as weight, hypertension, lipid management, diabetes, heart failure and other comorbidities.   Expected Outcomes Short Term Goal:  Understand basic principles of dietary content, such as calories, fat, sodium, cholesterol and nutrients.;Long Term Goal: Adherence to prescribed nutrition plan.      Nutrition Discharge: Rate Your Plate Scores:   Nutrition Goals Re-Evaluation:   Psychosocial: Target Goals: Acknowledge presence or absence of depression, maximize coping skills, provide positive support system. Participant is able to verbalize types and ability to use techniques and skills needed for reducing stress and depression.  Initial Review & Psychosocial Screening:     Initial Psych Review & Screening - 08/25/15 1516    Initial Review   Comments ay said he has talked with his minister and Sixto said he is a man of God and a [place] but is stressed now.       Quality of Life Scores:     Quality of Life - 08/25/15 1513    Quality of Life Scores   Health/Function Pre 15.73 %   Socioeconomic Pre 20.93 %   Psych/Spiritual Pre 19.36 %   Family Pre 21 %   GLOBAL Pre 18.24 %      PHQ-9:     Recent Review Flowsheet Data    Depression screen [facility] 2/9 08/25/2015   Decreased Interest 2   Down, Depressed, Hopeless 1   PHQ - 2 Score 3   Altered sleeping 2   Tired, decreased energy 1   Change in appetite 1   Feeling bad or failure about yourself  1   Trouble concentrating 1   Moving slowly or fidgety/restless 1   Suicidal thoughts 0   PHQ-9 Score 10   Difficult doing work/chores Somewhat difficult      Psychosocial Evaluation and Intervention:   Psychosocial Re-Evaluation:   Vocational Rehabilitation: Provide vocational rehab assistance to qualifying candidates.   Vocational Rehab Evaluation & Intervention:     Vocational Rehab -  08/25/15 1245    Initial Vocational Rehab Evaluation & Intervention   Assessment shows need for Vocational Rehabilitation Yes      Education: Education Goals: Education classes will be provided on a weekly basis, covering required topics. Participant will state understanding/return demonstration of topics presented.  Learning Barriers/Preferences:     Learning Barriers/Preferences - 08/25/15 1245    Learning Barriers/Preferences   Learning Barriers None   Learning Preferences Group Instruction;Skilled Demonstration;Written Material      Education Topics: General Nutrition Guidelines/Fats and Fiber: -Group instruction provided by verbal, written material, models and posters to present the general guidelines for heart healthy nutrition. Gives an explanation and review of dietary fats and fiber.   Controlling Sodium/Reading Food Labels: -Group verbal and written material supporting the discussion of sodium use in heart healthy nutrition. Review and explanation with models, verbal and written materials for utilization of the food label.   Exercise Physiology & Risk Factors: - Group verbal and written instruction with models to review the exercise physiology of the cardiovascular system and associated critical values. Details cardiovascular disease risk factors and the goals associated with each risk factor.   Aerobic Exercise & Resistance Training: - Gives group verbal and written discussion on the health impact of inactivity. On the components of aerobic and resistive training programs and the benefits of this training and how to safely progress through these programs.   Flexibility, Balance, General Exercise Guidelines: - Provides group verbal and written instruction on the benefits of flexibility and balance training programs. Provides general exercise guidelines with specific guidelines to those with heart or lung disease. Demonstration and skill practice provided.   Stress  Management: - Provides group verbal and written instruction about the health risks of elevated stress, cause of high stress, and healthy ways to reduce stress.   Depression: - Provides group verbal and written instruction on the correlation between heart/lung disease and depressed mood, treatment options, and the stigmas associated with seeking treatment.   Anatomy & Physiology of the Heart: - Group verbal and written instruction and models provide basic cardiac anatomy and physiology, with the coronary electrical and arterial systems. Review of: AMI, Angina, Valve disease, Heart Failure, Cardiac Arrhythmia, Pacemakers, and the ICD.   Cardiac Procedures: - Group verbal and written instruction and models to describe the testing methods done to diagnose heart disease. Reviews the outcomes of the test results. Describes the treatment choices: Medical Management, Angioplasty, or Coronary Bypass Surgery.   Cardiac Medications: - Group verbal and written instruction to review commonly prescribed medications for heart disease. Reviews the medication, class of the drug, and side effects. Includes the steps to properly store meds and maintain the prescription regimen.   Go Sex-Intimacy & Heart Disease, Get SMART - Goal Setting: - Group verbal and written instruction through game format to discuss heart disease and the return to sexual intimacy. Provides group verbal and written material to discuss and apply goal setting through the application of the S.M.A.R.T. Method.   Other Matters of the Heart: - Provides group verbal, written materials and models to describe Heart Failure, Angina, Valve Disease, and Diabetes in the realm of heart disease. Includes description of the disease process and treatment options available to the cardiac patient.   Exercise & Equipment Safety: - Individual verbal instruction and demonstration of equipment use and safety with use of the equipment.          Cardiac  Rehab from 08/25/2015 in [facility] Cardiac and Pulmonary Rehab   Date  08/25/15   Educator  C. EnterkinRN   Instruction Review Code  1- partially meets, needs review/practice      Infection Prevention: - Provides verbal and written material to individual with discussion of infection control including proper hand washing and proper equipment cleaning during exercise session.      Cardiac Rehab from 08/25/2015 in [facility] Cardiac and Pulmonary Rehab   Date  08/25/15   Educator  C. [place]   Instruction Review Code  1- partially meets, needs review/practice      Falls Prevention: - Provides verbal and written material to individual with discussion of falls prevention and safety.      Cardiac Rehab from 08/25/2015 in [facility] Cardiac and Pulmonary Rehab   Date  08/25/15   Educator  C. [place]   Instruction Review Code  1- partially meets, needs review/practice      Diabetes: - Individual verbal and written instruction to review signs/symptoms of diabetes, desired ranges of glucose level fasting, after meals and with exercise. Advice that pre and post exercise glucose checks will be done for 3 sessions at entry of program.      Cardiac Rehab from 08/25/2015 in [facility] Cardiac and Pulmonary Rehab   Date  08/25/15   Educator  C. [place]   Instruction Review Code  1- partially meets, needs review/practice       Knowledge Questionnaire Score:     Knowledge Questionnaire Score - 08/25/15 1513    Knowledge Questionnaire Score   Pre Score 25      Core Components/Risk Factors/Patient Goals at Admission:     Personal Goals and Risk Factors at Admission - 08/25/15 1257  Core Components/Risk Factors/Patient Goals on Admission    Weight Management Yes;Obesity   Intervention Weight Management: Develop a combined nutrition and exercise program designed to reach desired caloric intake, while maintaining appropriate intake of nutrient and fiber, sodium and fats, and appropriate energy expenditure  required for the weight goal.;Weight Management: Provide education and appropriate resources to help participant work on and attain dietary goals.;Weight Management/Obesity: Establish reasonable short term and long term weight goals.;Obesity: Provide education and appropriate resources to help participant work on and attain dietary goals.   Admit Weight 392 lb (177.81 kg)   Expected Outcomes Short Term: Continue to assess and modify interventions until short term weight is achieved;Weight Maintenance: Understanding of the daily nutrition guidelines, which includes 25-35% calories from fat, 7% or less cal from saturated fats, less than 21m cholesterol, less than 1.5gm of sodium, & 5 or more servings of fruits and vegetables daily;Weight Loss: Understanding of general recommendations for a balanced deficit meal plan, which promotes 1-2 lb weight loss per week and includes a negative energy balance of [phone] kcal/d;Understanding recommendations for meals to include 15-35% energy as protein, 25-35% energy from fat, 35-60% energy from carbohydrates, less than 2054mof dietary cholesterol, 20-35 gm of total fiber daily;Understanding of distribution of calorie intake throughout the day with the consumption of 4-5 meals/snacks;Weight Gain: Understanding of general recommendations for a high calorie, high protein meal plan that promotes weight gain by distributing calorie intake throughout the day with the consumption for 4-5 meals, snacks, and/or supplements   Sedentary Yes   Intervention Provide advice, education, support and counseling about physical activity/exercise needs.;Develop an individualized exercise prescription for aerobic and resistive training based on initial evaluation findings, risk stratification, comorbidities and participant's personal goals.   Expected Outcomes Achievement of increased cardiorespiratory fitness and enhanced flexibility, muscular endurance and strength shown through  measurements of functional capacity and personal statement of participant.   Stress Yes   Intervention Offer individual and/or small group education and counseling on adjustment to heart disease, stress management and health-related lifestyle change. Teach and support self-help strategies.;Refer participants experiencing significant psychosocial distress to appropriate mental health specialists for further evaluation and treatment. When possible, include family members and significant others in education/counseling sessions.   Expected Outcomes Short Term: Participant demonstrates changes in health-related behavior, relaxation and other stress management skills, ability to obtain effective social support, and compliance with psychotropic medications if prescribed.;Long Term: Emotional wellbeing is indicated by absence of clinically significant psychosocial distress or social isolation.      Core Components/Risk Factors/Patient Goals Review:    Core Components/Risk Factors/Patient Goals at Discharge (Final Review):    ITP Comments:     ITP Comments      08/25/15 1256 08/25/15 1305 08/25/15 1511 08/25/15 1515     ITP Comments JaRyshawnaid he has always been overweight. On Coumadin. Last HBA1c was 6.6 so recently started on Metformin. Stomach issues from abd flap to cover aortic valve replacement surgery.  Very stressed due to bills, $50 copay for Cardiac Rehab, Very stressed due to 4 surgery in 7 day Feb 28,th, Debrid March 31st, 3rd surgery for infection and more debridment May 29, 2015 and then 4th surgery for Omental Flap to put good tissue in open area in open heart surgery for Aortic Valve replacement April  4th, 2017. 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 Wife is a teacher-"I told her if she wanted to leave  me know since I have had 4 surgeries. Father is Gadiel John who was in Cardiac Rehab. Lyrick said he has talked with his minister and Khylan said he is a man of God and a Darrick Meigs but is stressed now.        Comments: Cleave said he has talked with his minister and Achilles said he is a man of God and a Darrick Meigs but is stressed now.

## 2015-08-25 NOTE — Patient Instructions (Signed)
Patient Instructions  Patient Details  Name: Russell Ray MRN: [id number] Date of Birth: Nov 18, 1970 Referring Provider:  Dionisio David, MD  Below are the personal goals you chose as well as exercise and nutrition goals. Our goal is to help you keep on track towards obtaining and maintaining your goals. We will be discussing your progress on these goals with you throughout the program.  Initial Exercise Prescription:     Initial Exercise Prescription - 08/25/15 1400    Date of Initial Exercise RX and Referring Provider   Date 08/25/15   Referring Provider Neoma Laming   Treadmill   MPH 2.5   Grade 0   Minutes 15   METs 2.9   Recumbant Bike   Level 3   RPM 50   Watts 75   Minutes 15   REL-XR   Level 3   Watts 75   Minutes 15   METs 2.8   T5 Nustep   Level 3   Watts 75   Minutes 15   METs 2.8   Prescription Details   Frequency (times per week) 3   Duration Progress to 30 minutes of continuous aerobic without signs/symptoms of physical distress   Intensity   THRR 40-80% of Max Heartrate 126-160   Ratings of Perceived Exertion 11-13   Perceived Dyspnea 0-4   Progression   Progression Continue to progress workloads to maintain intensity without signs/symptoms of physical distress.   Resistance Training   Training Prescription Yes   Weight 7  lifting restriction 30 lb   Reps 10-15      Exercise Goals: Frequency: Be able to perform aerobic exercise three times per week working toward 3-5 days per week.  Intensity: Work with a perceived exertion of 11 (fairly light) - 15 (hard) as tolerated. Follow your new exercise prescription and watch for changes in prescription as you progress with the program. Changes will be reviewed with you when they are made.  Duration: You should be able to do 30 minutes of continuous aerobic exercise in addition to a 5 minute warm-up and a 5 minute cool-down routine.  Nutrition Goals: Your personal nutrition goals will be established  when you do your nutrition analysis with the dietician.  The following are nutrition guidelines to follow: Cholesterol < 200mg /day Sodium < 1500mg /day Fiber: Men over 50 yrs - 30 grams per day  Personal Goals:     Personal Goals and Risk Factors at Admission - 08/25/15 1257    Core Components/Risk Factors/Patient Goals on Admission    Weight Management Yes;Obesity   Intervention Weight Management: Develop a combined nutrition and exercise program designed to reach desired caloric intake, while maintaining appropriate intake of nutrient and fiber, sodium and fats, and appropriate energy expenditure required for the weight goal.;Weight Management: Provide education and appropriate resources to help participant work on and attain dietary goals.;Weight Management/Obesity: Establish reasonable short term and long term weight goals.;Obesity: Provide education and appropriate resources to help participant work on and attain dietary goals.   Admit Weight 392 lb (177.81 kg)   Expected Outcomes Short Term: Continue to assess and modify interventions until short term weight is achieved;Weight Maintenance: Understanding of the daily nutrition guidelines, which includes 25-35% calories from fat, 7% or less cal from saturated fats, less than 200mg  cholesterol, less than 1.5gm of sodium, & 5 or more servings of fruits and vegetables daily;Weight Loss: Understanding of general recommendations for a balanced deficit meal plan, which promotes 1-2 lb weight loss per week and  includes a negative energy balance of [phone] kcal/d;Understanding recommendations for meals to include 15-35% energy as protein, 25-35% energy from fat, 35-60% energy from carbohydrates, less than 200mg  of dietary cholesterol, 20-35 gm of total fiber daily;Understanding of distribution of calorie intake throughout the day with the consumption of 4-5 meals/snacks;Weight Gain: Understanding of general recommendations for a high calorie, high protein  meal plan that promotes weight gain by distributing calorie intake throughout the day with the consumption for 4-5 meals, snacks, and/or supplements   Sedentary Yes   Intervention Provide advice, education, support and counseling about physical activity/exercise needs.;Develop an individualized exercise prescription for aerobic and resistive training based on initial evaluation findings, risk stratification, comorbidities and participant's personal goals.   Expected Outcomes Achievement of increased cardiorespiratory fitness and enhanced flexibility, muscular endurance and strength shown through measurements of functional capacity and personal statement of participant.   Stress Yes   Intervention Offer individual and/or small group education and counseling on adjustment to heart disease, stress management and health-related lifestyle change. Teach and support self-help strategies.;Refer participants experiencing significant psychosocial distress to appropriate mental health specialists for further evaluation and treatment. When possible, include family members and significant others in education/counseling sessions.   Expected Outcomes Short Term: Participant demonstrates changes in health-related behavior, relaxation and other stress management skills, ability to obtain effective social support, and compliance with psychotropic medications if prescribed.;Long Term: Emotional wellbeing is indicated by absence of clinically significant psychosocial distress or social isolation.      Tobacco Use Initial Evaluation: History  Smoking status  . Former Smoker  . Types: Cigarettes  Smokeless tobacco  . Not on file    Copy of goals given to participant.

## 2015-08-25 NOTE — Progress Notes (Signed)
Daily Session Note  Patient Details  Name: Russell Ray MRN: [id number] Date of Birth: 06-May-1970 Referring Provider:    Encounter Date: 08/25/2015  Check In:     Session Check In - 08/25/15 1245    Check-In   Location [facility]-Cardiac & Pulmonary Rehab   Staff Present Nada Maclachlan, BA, ACSM CEP, Exercise Physiologist;Raymond Azure, RN, BSN   Supervising physician immediately available to respond to emergencies See telemetry face sheet for immediately available ER MD   Medication changes reported     No   Warm-up and Cool-down --  6 minute test .    Resistance Training Performed No   Pain Assessment   Currently in Pain? Yes   Pain Score 3    Pain Location Chest   Pain Descriptors / Indicators Sore   Pain Type Chronic pain   Pain Onset More than a month ago   Pain Frequency Constant   Aggravating Factors  Getting in and out off his work truck. Laying on his left side.   Pain Relieving Factors Nothing helps.         Goals Met:  Proper associated with RPD/PD & O2 Sat Personal goals reviewed  Goals Unmet:  Not Applicable  Comments:     Dr. Emily Filbert is Medical Director for [place] and [facility].

## 2015-08-31 ENCOUNTER — Encounter: Payer: 59 | Attending: Cardiovascular Disease | Admitting: *Deleted

## 2015-08-31 DIAGNOSIS — Z952 Presence of prosthetic heart valve: Secondary | ICD-10-CM | POA: Insufficient documentation

## 2015-08-31 LAB — GLUCOSE, CAPILLARY
GLUCOSE-CAPILLARY: 101 mg/dL — AB (ref 65–99)
Glucose-Capillary: 104 mg/dL — ABNORMAL HIGH (ref 65–99)

## 2015-08-31 NOTE — Progress Notes (Signed)
Daily Session Note  Patient Details  Name: Russell Ray MRN: [id number] Date of Birth: [birth year] Referring Provider:        Cardiac Rehab from 08/25/2015 in [facility] Cardiac and Pulmonary Rehab   Referring Provider  Neoma Laming      Encounter Date: 08/31/2015  Check In:     Session Check In - 08/31/15 0757    Check-In   Location [facility]   Staff Present Gerlene Burdock, RN, Vickki Hearing, BA, ACSM CEP, Exercise Physiologist;Benjimen Kelley Luan Pulling, MA, ACSM RCEP, Exercise Physiologist   Supervising physician immediately available to respond to emergencies See telemetry face sheet for immediately available ER MD   Medication changes reported     No   Fall or balance concerns reported    No   Warm-up and Cool-down Performed on first and last piece of equipment   Resistance Training Performed Yes   VAD Patient? No   Pain Assessment   Currently in Pain? No/denies   Multiple Pain Sites No         Goals Met:  Exercise tolerated well Personal goals reviewed No report of cardiac concerns or symptoms Strength training completed today  Goals Unmet:  Not Applicable  Comments: First full day of exercise!  Patient was oriented to gym and equipment including functions, settings, policies, and procedures.  Patient's individual exercise prescription and treatment plan were reviewed.  All starting workloads were established based on the results of the 6 minute walk test done at initial orientation visit.  The plan for exercise progression was also introduced and progression will be customized based on patient's performance and goals.    Dr. Emily Filbert is Medical Director for [place] and [facility].

## 2015-09-05 ENCOUNTER — Encounter: Payer: 59 | Admitting: *Deleted

## 2015-09-05 DIAGNOSIS — Z952 Presence of prosthetic heart valve: Secondary | ICD-10-CM

## 2015-09-05 NOTE — Progress Notes (Signed)
Daily Session Note  Patient Details  Name: Russell Ray MRN: [id number] Date of Birth: [birth year] Referring Provider:        Cardiac Rehab from 08/25/2015 in [facility] Card[facility] Rehab   Referring Provider  Neoma Laming      Encounter Date: 09/05/2015  Check In:     Session Check In - 09/05/15 0826    Check-In   Location [facility]   Staff Present Heath Lark, RN, BSN, Laveda Norman, BS, ACSM CEP, Exercise Physiologist;Jessica [place], [place], ACSM RCEP, Exercise Physiologist   Supervising physician immediately available to respond to emergencies See telemetry face sheet for immediately available ER MD   Medication changes reported     No   Fall or balance concerns reported    No   Warm-up and Cool-down Performed on first and last piece of equipment   Resistance Training Performed Yes   VAD Patient? No   Pain Assessment   Currently in Pain? No/denies   Multiple Pain Sites No         Goals Met:  Proper associated with RPD/PD & O2 Sat Independence with exercise equipment Exercise tolerated well No report of cardiac concerns or symptoms Strength training completed today  Goals Unmet:  Not Applicable  Comments: Patient completed exercise prescription and all exercise goals during rehab session. [name]     Dr. Emily Filbert is Medical Director for [place] and [facility].

## 2015-09-05 NOTE — Progress Notes (Signed)
Daily Session Note  Patient Details  Name: Russell Ray MRN: [id number] Date of Birth: April 26, 1970 Referring Provider:        Cardiac Rehab from 08/25/2015 in [facility] Cardiac and Pulmonary Rehab   Referring Provider  Neoma Laming      Encounter Date: 09/05/2015  Check In:     Session Check In - 09/05/15 0826    Check-In   Location [facility]   Staff Present Heath Lark, RN, BSN, Laveda Norman, BS, ACSM CEP, Exercise Physiologist;Jessica [place], [place], ACSM RCEP, Exercise Physiologist   Supervising physician immediately available to respond to emergencies See telemetry face sheet for immediately available ER MD   Medication changes reported     No   Fall or balance concerns reported    No   Warm-up and Cool-down Performed on first and last piece of equipment   Resistance Training Performed Yes   VAD Patient? No   Pain Assessment   Currently in Pain? No/denies   Multiple Pain Sites No         Goals Met:  Exercise tolerated well No report of cardiac concerns or symptoms  Goals Unmet:  Not Applicable  Comments: Doing well with exercise prescription progression.    Dr. Emily Filbert is Medical Director for [place] and [facility].

## 2015-09-12 ENCOUNTER — Encounter: Payer: 59 | Admitting: *Deleted

## 2015-09-12 DIAGNOSIS — Z952 Presence of prosthetic heart valve: Secondary | ICD-10-CM | POA: Diagnosis not present

## 2015-09-12 NOTE — Progress Notes (Signed)
Daily Session Note  Patient Details  Name: Russell Ray MRN: [id number] Date of Birth: Feb 11, 1971 Referring Provider:        Cardiac Rehab from 08/25/2015 in [facility] Cardiac and Pulmonary Rehab   Referring Provider  Neoma Laming      Encounter Date: 09/12/2015  Check In:     Session Check In - 09/12/15 1004    Check-In   Location [facility]   Staff Present Heath Lark, RN, BSN, Laveda Norman, BS, ACSM CEP, Exercise Physiologist;Jessica [place], [place], ACSM RCEP, Exercise Physiologist   Supervising physician immediately available to respond to emergencies See telemetry face sheet for immediately available ER MD   Medication changes reported     No   Fall or balance concerns reported    No   Warm-up and Cool-down Performed on first and last piece of equipment   Resistance Training Performed Yes   VAD Patient? No   Pain Assessment   Currently in Pain? No/denies   Multiple Pain Sites No         Goals Met:  Independence with exercise equipment Exercise tolerated well No report of cardiac concerns or symptoms Strength training completed today  Goals Unmet:  Not Applicable  Comments: Patient completed exercise prescription and all exercise goals during rehab session. [name]     Dr. Emily Filbert is Medical Director for [place] and [facility].

## 2015-09-14 ENCOUNTER — Encounter: Payer: Self-pay | Admitting: *Deleted

## 2015-09-14 DIAGNOSIS — Z952 Presence of prosthetic heart valve: Secondary | ICD-10-CM

## 2015-09-14 NOTE — Progress Notes (Signed)
Cardiac Individual Treatment Plan  Patient Details  Name: Russell Ray MRN: [id number] Date of Birth: 1970-11-28 Referring Provider:        Cardiac Rehab from 08/25/2015 in [facility] Cardiac and Pulmonary Rehab   Referring Provider  Neoma Laming      Initial Encounter Date:       Cardiac Rehab from 08/25/2015 in [facility] Cardiac and Pulmonary Rehab   Date  08/25/15   Referring Provider  Neoma Laming      Visit Diagnosis: S/P aortic valve replacement  Patient's Home Medications on Admission:  Current outpatient prescriptions:  .  acetaminophen (TYLENOL) 325 MG tablet, Take 650 mg by mouth every 4 (four) hours as needed for moderate pain., Disp: , Rfl:  .  amiodarone (PACERONE) 400 MG tablet, Take 1 tablet (400 mg total) by mouth 2 (two) times daily., Disp: 60 tablet, Rfl: 2 .  aspirin EC 81 MG tablet, Take by mouth., Disp: , Rfl:  .  cyanocobalamin 1000 MCG tablet, Take 1,000 mcg by mouth daily., Disp: , Rfl:  .  cyclobenzaprine (FLEXERIL) 5 MG tablet, Take 5 mg by mouth 3 (three) times daily as needed., Disp: , Rfl:  .  furosemide (LASIX) 40 MG tablet, Take 1 tablet (40 mg total) by mouth daily. (Patient taking differently: Take 40 mg by mouth 2 (two) times daily. ), Disp: 30 tablet, Rfl: 6 .  KLOR-CON M20 20 MEQ tablet, Take 1 tablet by mouth 2 (two) times daily., Disp: , Rfl:  .  LORazepam (ATIVAN) 1 MG tablet, Take 1 mg by mouth 2 (two) times daily., Disp: , Rfl: 2 .  metFORMIN (GLUCOPHAGE) 500 MG tablet, Take 500 mg by mouth 2 (two) times daily., Disp: , Rfl: 0 .  metoprolol (LOPRESSOR) 50 MG tablet, Take 1 tablet by mouth 2 (two) times daily., Disp: , Rfl:  .  nicotine (NICODERM CQ - DOSED IN MG/24 HOURS) 14 mg/24hr patch, Place 14 mg onto the skin daily., Disp: , Rfl:  .  oxyCODONE (OXY IR/ROXICODONE) 5 MG immediate release tablet, Take 1 tablet by mouth every 4 (four) hours as needed., Disp: , Rfl:  .  polyethylene glycol (MIRALAX / GLYCOLAX) packet, Take 17 g by mouth daily as needed.,  Disp: , Rfl:  .  senna-docusate (SENOKOT-S) 8.6-50 MG tablet, Take 1 tablet by mouth at bedtime as needed for mild constipation., Disp: 30 tablet, Rfl: 6 .  warfarin (COUMADIN) 2 MG tablet, Take 3 tablets (6 mg total) by mouth at bedtime., Disp: 90 tablet, Rfl: 5  Past Medical History: Past Medical History  Diagnosis Date  . Diabetes mellitus without complication ([place])   . Hypertension   . Anxiety   . Atrial fibrillation ([facility])     Tobacco Use: History  Smoking status  . Former Smoker  . Types: Cigarettes  Smokeless tobacco  . Not on file    Labs: Recent Review Flowsheet Data    Labs for ITP Cardiac and Pulmonary Rehab Latest Ref Rng 03/18/2015 04/22/2015 05/03/2015   Hemoglobin A1c 4.0 - 6.0 % 5.7 5.8 6.2(H)       Exercise Target Goals:    Exercise Program Goal: Individual exercise prescription set with THRR, safety & activity barriers. Participant demonstrates ability to understand and report RPE using BORG scale, to self-measure pulse accurately, and to acknowledge the importance of the exercise prescription.  Exercise Prescription Goal: Starting with aerobic activity 30 plus minutes a day, 3 days per week for initial exercise prescription. Provide home exercise prescription and guidelines  that participant acknowledges understanding prior to discharge.  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    Cardiac Risk Stratification High      6 Minute Walk:     6 Minute Walk      08/25/15 1424       6 Minute Walk   Distance 1325 feet     Walk Time 6 minutes     MPH 2.5     METS 3.35     RPE 13     VO2 Peak 11.7     Symptoms No     Resting HR 93 bpm     Resting BP 142/68 mmHg     Max Ex. HR 110 bpm     Max Ex. BP 198/104 mmHg     2 Minute Post BP 150/80  mmHg        Initial Exercise Prescription:     Initial Exercise Prescription - 08/25/15 1400    Date of Initial Exercise RX and Referring Provider   Date 08/25/15   Referring Provider Neoma Laming   Treadmill   MPH 2.5   Grade 0   Minutes 15   METs 2.9   Recumbant Bike   Level 3   RPM 50   Watts 75   Minutes 15   REL-XR   Level 3   Watts 75   Minutes 15   METs 2.8   T5 Nustep   Level 3   Watts 75   Minutes 15   METs 2.8   Prescription Details   Frequency (times per week) 3   Duration Progress to 30 minutes of continuous aerobic without signs/symptoms of physical distress   Intensity   THRR 40-80% of Max Heartrate 126-160   Ratings of Perceived Exertion 11-13   Perceived Dyspnea 0-4   Progression   Progression Continue to progress workloads to maintain intensity without signs/symptoms of physical distress.   Resistance Training   Training Prescription Yes   Weight 7  lifting restriction 30 lb   Reps 10-15      Perform Capillary Blood Glucose checks as needed.  Exercise Prescription Changes:     Exercise Prescription Changes      09/08/15 0800           Exercise Review   Progression Yes       Response to Exercise   Blood Pressure (Admit) 150/82 mmHg       Blood Pressure (Exercise) 144/78 mmHg       Blood Pressure (Exit) 124/76 mmHg       Heart Rate (Admit) 86 bpm       Heart Rate (Exercise) 103 bpm       Heart Rate (Exit) 92 bpm       Rating of Perceived Exertion (Exercise) 12       Symptoms none       Duration Progress to 45 minutes of aerobic exercise without signs/symptoms of physical distress       Intensity THRR unchanged       Progression   Progression Continue to progress workloads to maintain intensity without signs/symptoms of physical distress.       Average METs 1.7       Resistance Training   Training Prescription Yes  Weight 7       Reps 10-15       Interval Training   Interval Training No       REL-XR   Level 3        Minutes 15       METs 1.1       T5 Nustep   Level 3       Minutes 15       METs 2.3          Exercise Comments:     Exercise Comments      08/31/15 0759 09/08/15 0849         Exercise Comments First full day of exercise!  Patient was oriented to gym and equipment including functions, settings, policies, and procedures.  Patient's individual exercise prescription and treatment plan were reviewed.  All starting workloads were established based on the results of the 6 minute walk test done at initial orientation visit.  The plan for exercise progression was also introduced and progression will be customized based on patient's performance and goals. Cortavious is off to a good start with exercise.  He has been on vacation this week.  Unfortunately, he cannot afford to stay the whole program due to insurance copays.  He has already made good progress and we will continue to monitor for progress.         Discharge Exercise Prescription (Final Exercise Prescription Changes):     Exercise Prescription Changes - 09/08/15 0800    Exercise Review   Progression Yes   Response to Exercise   Blood Pressure (Admit) 150/82 mmHg   Blood Pressure (Exercise) 144/78 mmHg   Blood Pressure (Exit) 124/76 mmHg   Heart Rate (Admit) 86 bpm   Heart Rate (Exercise) 103 bpm   Heart Rate (Exit) 92 bpm   Rating of Perceived Exertion (Exercise) 12   Symptoms none   Duration Progress to 45 minutes of aerobic exercise without signs/symptoms of physical distress   Intensity THRR unchanged   Progression   Progression Continue to progress workloads to maintain intensity without signs/symptoms of physical distress.   Average METs 1.7   Resistance Training   Training Prescription Yes   Weight 7   Reps 10-15   Interval Training   Interval Training No   REL-XR   Level 3   Minutes 15   METs 1.1   T5 Nustep   Level 3   Minutes 15   METs 2.3      Nutrition:  Target Goals: Understanding of nutrition  guidelines, daily intake of sodium <1574m, cholesterol <2053m calories 30% from fat and 7% or less from saturated fats, daily to have 5 or more servings of fruits and vegetables.  Biometrics:     Pre Biometrics - 08/25/15 1420    Pre Biometrics   Height _0  (1.803 m)   Weight (!) 392 lb 12.8 oz (178.173 kg)   Waist Circumference 63 inches   Hip Circumference 60 inches   Waist to Hip Ratio 1.05 %   BMI (Calculated) 54.9       Nutrition Therapy Plan and Nutrition Goals:     Nutrition Therapy & Goals - 08/25/15 1256    Nutrition Therapy   Drug/Food Interactions Coumadin/Vit K   Personal Nutrition Goals   Personal Goal #1 Diabetic diet, Coumadin   Intervention Plan   Intervention Prescribe, educate and counsel regarding individualized specific dietary modifications aiming towards targeted core components such as weight, hypertension, lipid management, diabetes,  heart failure and other comorbidities.   Expected Outcomes Short Term Goal: Understand basic principles of dietary content, such as calories, fat, sodium, cholesterol and nutrients.;Long Term Goal: Adherence to prescribed nutrition plan.      Nutrition Discharge: Rate Your Plate Scores:   Nutrition Goals Re-Evaluation:   Psychosocial: Target Goals: Acknowledge presence or absence of depression, maximize coping skills, provide positive support system. Participant is able to verbalize types and ability to use techniques and skills needed for reducing stress and depression.  Initial Review & Psychosocial Screening:     Initial Psych Review & Screening - 08/25/15 1517    Initial Review   Comments Jamian said he is not sure that he can go back to work what he was doing so will fax to [employer]. Tami is considereding applying for disability too.       Quality of Life Scores:     Quality of Life - 08/25/15 1513    Quality of Life Scores   Health/Function Pre 15.73 %   Socioeconomic Pre 20.93 %   Psych/Spiritual  Pre 19.36 %   Family Pre 21 %   GLOBAL Pre 18.24 %      PHQ-9:     Recent Review Flowsheet Data    Depression screen [facility] 2/9 08/25/2015   Decreased Interest 2   Down, Depressed, Hopeless 1   PHQ - 2 Score 3   Altered sleeping 2   Tired, decreased energy 1   Change in appetite 1   Feeling bad or failure about yourself  1   Trouble concentrating 1   Moving slowly or fidgety/restless 1   Suicidal thoughts 0   PHQ-9 Score 10   Difficult doing work/chores Somewhat difficult      Psychosocial Evaluation and Intervention:     Psychosocial Evaluation - 08/31/15 0942    Psychosocial Evaluation & Interventions   Interventions Stress management education;Relaxation education;Encouraged to exercise with the program and follow exercise prescription   Comments Counselor met with Mr. Norrod today for initial psychosocial evaluation.  He is a 45 year old with a family history of heart disease.  He had open heart surgery in February of this year and has had multiple surgeries and infections since that time.  His last surgery was 05/31/15.  Mr. Sarli has a strong support system with a spouse of 8 years, both parents and he is also actively involved in his local church.  He has other health issues as well with obesity, hernea surgery and diabetes.  Mr. Sailors states he does not sleep well with intermittently waking up to go to the bathroom.  He states he has been told he likely has sleep apnea and is in need of a sleep study.  Mr. Motyka reports a history of depression and anxiety since 200t and is on medication PRN for anxiety.  He states this is not completely treating all of his symptoms as he continues to worry and feel anxious quite a bit and admits to some Obsessive Compulsive tendencies.  Counselor encouraged Mr. Guirguis to see his Dr/PCP about possible treatment for this as his mood is impacted currently by these symptoms.  Mr. Cubit admits to multiple stressors in his life with finances; potential for  loss of his job and insurance, and his health.  He has goals to get a baseline to know what is reasonable to work out in the gym while here and then transfer that information to his local gym.  Counselor recommended Mr. Picazo  speak to his Dr. about his frequent urination; a sleep study and possible treatment of depression/anxiety.  counselor provided information to consult with his Dr. on an OTC natural sleep aid to help with his intermittent sleep at this time.  Counselor will follow with Mr. Dimare during the course of this program.     Continued Psychosocial Services Needed Yes  Mr. Laguardia will benefit from the psychoeducational components of this program; especially stress management and relaxation. Counselor taught Mr. Eastburn to do deep breathing today and encouraged to practice.  He also needs to meet with the dietician.        Psychosocial Re-Evaluation:   Vocational Rehabilitation: Provide vocational rehab assistance to qualifying candidates.   Vocational Rehab Evaluation & Intervention:     Vocational Rehab - 08/25/15 1245    Initial Vocational Rehab Evaluation & Intervention   Assessment shows need for Vocational Rehabilitation Yes      Education: Education Goals: Education classes will be provided on a weekly basis, covering required topics. Participant will state understanding/return demonstration of topics presented.  Learning Barriers/Preferences:     Learning Barriers/Preferences - 08/25/15 1245    Learning Barriers/Preferences   Learning Barriers None   Learning Preferences Group Instruction;Skilled Demonstration;Written Material      Education Topics: General Nutrition Guidelines/Fats and Fiber: -Group instruction provided by verbal, written material, models and posters to present the general guidelines for heart healthy nutrition. Gives an explanation and review of dietary fats and fiber.   Controlling Sodium/Reading Food Labels: -Group verbal and written  material supporting the discussion of sodium use in heart healthy nutrition. Review and explanation with models, verbal and written materials for utilization of the food label.   Exercise Physiology & Risk Factors: - Group verbal and written instruction with models to review the exercise physiology of the cardiovascular system and associated critical values. Details cardiovascular disease risk factors and the goals associated with each risk factor.   Aerobic Exercise & Resistance Training: - Gives group verbal and written discussion on the health impact of inactivity. On the components of aerobic and resistive training programs and the benefits of this training and how to safely progress through these programs.   Flexibility, Balance, General Exercise Guidelines: - Provides group verbal and written instruction on the benefits of flexibility and balance training programs. Provides general exercise guidelines with specific guidelines to those with heart or lung disease. Demonstration and skill practice provided.   Stress Management: - Provides group verbal and written instruction about the health risks of elevated stress, cause of high stress, and healthy ways to reduce stress.   Depression: - Provides group verbal and written instruction on the correlation between heart/lung disease and depressed mood, treatment options, and the stigmas associated with seeking treatment.   Anatomy & Physiology of the Heart: - Group verbal and written instruction and models provide basic cardiac anatomy and physiology, with the coronary electrical and arterial systems. Review of: AMI, Angina, Valve disease, Heart Failure, Cardiac Arrhythmia, Pacemakers, and the ICD.   Cardiac Procedures: - Group verbal and written instruction and models to describe the testing methods done to diagnose heart disease. Reviews the outcomes of the test results. Describes the treatment choices: Medical Management, Angioplasty,  or Coronary Bypass Surgery.          Cardiac Rehab from 09/12/2015 in [facility] Cardiac and Pulmonary Rehab   Date  08/31/15   Educator  CE   Instruction Review Code  2- meets goals/outcomes  Cardiac Medications: - Group verbal and written instruction to review commonly prescribed medications for heart disease. Reviews the medication, class of the drug, and side effects. Includes the steps to properly store meds and maintain the prescription regimen.      Cardiac Rehab from 09/12/2015 in [facility] Cardiac and Pulmonary Rehab   Date  09/12/15   Educator  SB   Instruction Review Code  2- meets goals/outcomes [cardiac meds lecture 2]      Go Sex-Intimacy & Heart Disease, Get SMART - Goal Setting: - Group verbal and written instruction through game format to discuss heart disease and the return to sexual intimacy. Provides group verbal and written material to discuss and apply goal setting through the application of the S.M.A.R.T. Method.      Cardiac Rehab from 09/12/2015 in [facility] Cardiac and Pulmonary Rehab   Date  08/31/15   Educator  CE   Instruction Review Code  2- meets goals/outcomes      Other Matters of the Heart: - Provides group verbal, written materials and models to describe Heart Failure, Angina, Valve Disease, and Diabetes in the realm of heart disease. Includes description of the disease process and treatment options available to the cardiac patient.   Exercise & Equipment Safety: - Individual verbal instruction and demonstration of equipment use and safety with use of the equipment.      Cardiac Rehab from 09/12/2015 in [facility] Cardiac and Pulmonary Rehab   Date  08/25/15   Educator  C. EnterkinRN   Instruction Review Code  1- partially meets, needs review/practice      Infection Prevention: - Provides verbal and written material to individual with discussion of infection control including proper hand washing and proper equipment cleaning during exercise session.       Cardiac Rehab from 09/12/2015 in [facility] Cardiac and Pulmonary Rehab   Date  08/25/15   Educator  C. [place]   Instruction Review Code  1- partially meets, needs review/practice      Falls Prevention: - Provides verbal and written material to individual with discussion of falls prevention and safety.      Cardiac Rehab from 09/12/2015 in [facility] Cardiac and Pulmonary Rehab   Date  08/25/15   Educator  C. [place]   Instruction Review Code  1- partially meets, needs review/practice      Diabetes: - Individual verbal and written instruction to review signs/symptoms of diabetes, desired ranges of glucose level fasting, after meals and with exercise. Advice that pre and post exercise glucose checks will be done for 3 sessions at entry of program.      Cardiac Rehab from 09/12/2015 in [facility] Cardiac and Pulmonary Rehab   Date  08/25/15   Educator  C. [place]   Instruction Review Code  1- partially meets, needs review/practice       Knowledge Questionnaire Score:     Knowledge Questionnaire Score - 08/25/15 1513    Knowledge Questionnaire Score   Pre Score 25      Core Components/Risk Factors/Patient Goals at Admission:     Personal Goals and Risk Factors at Admission - 08/25/15 1257    Core Components/Risk Factors/Patient Goals on Admission    Weight Management Yes;Obesity   Intervention Weight Management: Develop a combined nutrition and exercise program designed to reach desired caloric intake, while maintaining appropriate intake of nutrient and fiber, sodium and fats, and appropriate energy expenditure required for the weight goal.;Weight Management: Provide education and appropriate resources to help participant work on and  attain dietary goals.;Weight Management/Obesity: Establish reasonable short term and long term weight goals.;Obesity: Provide education and appropriate resources to help participant work on and attain dietary goals.   Admit Weight 392 lb (177.81 kg)    Expected Outcomes Short Term: Continue to assess and modify interventions until short term weight is achieved;Weight Maintenance: Understanding of the daily nutrition guidelines, which includes 25-35% calories from fat, 7% or less cal from saturated fats, less than 275m cholesterol, less than 1.5gm of sodium, & 5 or more servings of fruits and vegetables daily;Weight Loss: Understanding of general recommendations for a balanced deficit meal plan, which promotes 1-2 lb weight loss per week and includes a negative energy balance of [phone] kcal/d;Understanding recommendations for meals to include 15-35% energy as protein, 25-35% energy from fat, 35-60% energy from carbohydrates, less than 2034mof dietary cholesterol, 20-35 gm of total fiber daily;Understanding of distribution of calorie intake throughout the day with the consumption of 4-5 meals/snacks;Weight Gain: Understanding of general recommendations for a high calorie, high protein meal plan that promotes weight gain by distributing calorie intake throughout the day with the consumption for 4-5 meals, snacks, and/or supplements   Sedentary Yes   Intervention Provide advice, education, support and counseling about physical activity/exercise needs.;Develop an individualized exercise prescription for aerobic and resistive training based on initial evaluation findings, risk stratification, comorbidities and participant's personal goals.   Expected Outcomes Achievement of increased cardiorespiratory fitness and enhanced flexibility, muscular endurance and strength shown through measurements of functional capacity and personal statement of participant.   Stress Yes   Intervention Offer individual and/or small group education and counseling on adjustment to heart disease, stress management and health-related lifestyle change. Teach and support self-help strategies.;Refer participants experiencing significant psychosocial distress to appropriate mental health  specialists for further evaluation and treatment. When possible, include family members and significant others in education/counseling sessions.   Expected Outcomes Short Term: Participant demonstrates changes in health-related behavior, relaxation and other stress management skills, ability to obtain effective social support, and compliance with psychotropic medications if prescribed.;Long Term: Emotional wellbeing is indicated by absence of clinically significant psychosocial distress or social isolation.      Core Components/Risk Factors/Patient Goals Review:      Goals and Risk Factor Review      09/12/15 1517           Core Components/Risk Factors/Patient Goals Review   Personal Goals Review Weight Management/Obesity;Sedentary;Increase Strength and Stamina;Diabetes;Tobacco Cessation;Stress;Lipids;Hypertension;Heart Failure       Review JaCorbins off to a good start with rehab.  He is only able to attend for a few weeks due to his copay.  He is not exercising at home yet, but does want to add it.  His weight is back up some today after [place].  He has still not had a cigarrette, but is still craving them.  His blood sugars are doing good but he is sitll getting dizzy and nausea spells.  Lipids are good. Stress is still a contributing factor but he has a good support through church and wife.       Expected Outcomes JaAwabill continue to exercise and come to class.  We will encourage better diet choices for overall improvement.  We will continue to monitor for progress.          Core Components/Risk Factors/Patient Goals at Discharge (Final Review):      Goals and Risk Factor Review - 09/12/15 1517    Core Components/Risk  Factors/Patient Goals Review   Personal Goals Review Weight Management/Obesity;Sedentary;Increase Strength and Stamina;Diabetes;Tobacco Cessation;Stress;Lipids;Hypertension;Heart Failure   Review Gurveer is off to a good start with rehab.  He  is only able to attend for a few weeks due to his copay.  He is not exercising at home yet, but does want to add it.  His weight is back up some today after indulging in [place] over the weekend.  He has still not had a cigarrette, but is still craving them.  His blood sugars are doing good but he is sitll getting dizzy and nausea spells.  Lipids are good. Stress is still a contributing factor but he has a good support through church and wife.   Expected Outcomes Hipolito will continue to exercise and come to class.  We will encourage better diet choices for overall improvement.  We will continue to monitor for progress.      ITP Comments:     ITP Comments      08/25/15 1256 08/25/15 1305 08/25/15 1511 08/25/15 1515 08/25/15 1517   ITP Comments Ihan said he has always been overweight. On Coumadin. Last HBA1c was 6.6 so recently started on Metformin. Stomach issues from abd flap to cover aortic valve replacement surgery.  Very stressed due to bills, $50 copay for Cardiac Rehab, Very stressed due to 4 surgery in 7 day Feb 28,th, Debrid March 31st, 3rd surgery for infection and more debridment May 29, 2015 and then 4th surgery for Omental Flap to put good tissue in open area in open heart surgery for Aortic Valve replacement April  4th, 2017. 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 Wife is a teacher-"I told her if she wanted to leave me know since I have had 4 surgeries. Father is Brit Wernette who was in Cardiac Rehab. Atif said he has talked with his minister and Tabius said he is a man of God and a Darrick Meigs but is stressed now.  Roshaun said he is not sure that he can go back to work what he was doing so will fax to [employer]. Tara is considereding applying for disability too.      09/14/15 0747           ITP Comments 30 day  review. Continue with ITP          Comments:

## 2015-09-28 ENCOUNTER — Encounter: Payer: 59 | Attending: Cardiovascular Disease

## 2015-09-28 DIAGNOSIS — Z952 Presence of prosthetic heart valve: Secondary | ICD-10-CM | POA: Insufficient documentation

## 2015-10-06 ENCOUNTER — Telehealth: Payer: Self-pay | Admitting: *Deleted

## 2015-10-06 ENCOUNTER — Encounter: Payer: Self-pay | Admitting: *Deleted

## 2015-10-06 DIAGNOSIS — Z952 Presence of prosthetic heart valve: Secondary | ICD-10-CM

## 2015-10-06 NOTE — Telephone Encounter (Signed)
Left message on mobile checking of status of return to program.

## 2015-10-12 ENCOUNTER — Encounter: Payer: Self-pay | Admitting: *Deleted

## 2015-10-12 ENCOUNTER — Telehealth: Payer: Self-pay | Admitting: *Deleted

## 2015-10-12 DIAGNOSIS — Z952 Presence of prosthetic heart valve: Secondary | ICD-10-CM

## 2015-10-12 NOTE — Progress Notes (Signed)
Cardiac Individual Treatment Plan  Patient Details  Name: Russell Ray MRN: [id number] Date of Birth: 1971-02-11 Referring Provider:   Flowsheet Row Cardiac Rehab from 08/25/2015 in [facility] Cardiac and Pulmonary Rehab  Referring Provider  Neoma Laming      Initial Encounter Date:  Flowsheet Row Cardiac Rehab from 08/25/2015 in [facility] Cardiac and Pulmonary Rehab  Date  08/25/15  Referring Provider  Neoma Laming      Visit Diagnosis: S/P aortic valve replacement  Patient's Home Medications on Admission:  Current Outpatient Prescriptions:  .  acetaminophen (TYLENOL) 325 MG tablet, Take 650 mg by mouth every 4 (four) hours as needed for moderate pain., Disp: , Rfl:  .  amiodarone (PACERONE) 400 MG tablet, Take 1 tablet (400 mg total) by mouth 2 (two) times daily., Disp: 60 tablet, Rfl: 2 .  aspirin EC 81 MG tablet, Take by mouth., Disp: , Rfl:  .  cyanocobalamin 1000 MCG tablet, Take 1,000 mcg by mouth daily., Disp: , Rfl:  .  cyclobenzaprine (FLEXERIL) 5 MG tablet, Take 5 mg by mouth 3 (three) times daily as needed., Disp: , Rfl:  .  furosemide (LASIX) 40 MG tablet, Take 1 tablet (40 mg total) by mouth daily. (Patient taking differently: Take 40 mg by mouth 2 (two) times daily. ), Disp: 30 tablet, Rfl: 6 .  KLOR-CON M20 20 MEQ tablet, Take 1 tablet by mouth 2 (two) times daily., Disp: , Rfl:  .  LORazepam (ATIVAN) 1 MG tablet, Take 1 mg by mouth 2 (two) times daily., Disp: , Rfl: 2 .  metFORMIN (GLUCOPHAGE) 500 MG tablet, Take 500 mg by mouth 2 (two) times daily., Disp: , Rfl: 0 .  metoprolol (LOPRESSOR) 50 MG tablet, Take 1 tablet by mouth 2 (two) times daily., Disp: , Rfl:  .  nicotine (NICODERM CQ - DOSED IN MG/24 HOURS) 14 mg/24hr patch, Place 14 mg onto the skin daily., Disp: , Rfl:  .  oxyCODONE (OXY IR/ROXICODONE) 5 MG immediate release tablet, Take 1 tablet by mouth every 4 (four) hours as needed., Disp: , Rfl:  .  polyethylene glycol (MIRALAX / GLYCOLAX) packet, Take 17 g by mouth  daily as needed., Disp: , Rfl:  .  senna-docusate (SENOKOT-S) 8.6-50 MG tablet, Take 1 tablet by mouth at bedtime as needed for mild constipation., Disp: 30 tablet, Rfl: 6 .  warfarin (COUMADIN) 2 MG tablet, Take 3 tablets (6 mg total) by mouth at bedtime., Disp: 90 tablet, Rfl: 5  Past Medical History: Past Medical History:  Diagnosis Date  . Anxiety   . Atrial fibrillation ([place])   . Diabetes mellitus without complication ([place])   . Hypertension     Tobacco Use: History  Smoking Status  . Former Smoker  . Types: Cigarettes  Smokeless Tobacco  . Not on file    Labs: Recent Review Flowsheet Data    Labs for ITP Cardiac and Pulmonary Rehab Latest Ref Rng & Units 03/18/2015 04/22/2015 05/03/2015   Hemoglobin A1c 4.0 - 6.0 % 5.7 5.8 6.2(H)       Exercise Target Goals:    Exercise Program Goal: Individual exercise prescription set with THRR, safety & activity barriers. Participant demonstrates ability to understand and report RPE using BORG scale, to self-measure pulse accurately, and to acknowledge the importance of the exercise prescription.  Exercise Prescription Goal: Starting with aerobic activity 30 plus minutes a day, 3 days per week for initial exercise prescription. Provide home exercise prescription and guidelines that participant acknowledges understanding prior to discharge.  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    Cardiac Risk Stratification High      6 Minute Walk:     6 Minute Walk    Row Name 08/25/15 1424 10/12/15 1053       6 Minute Walk   Distance 1325 feet -  Discharge 6 min walk not done early exit.    Walk Time 6 minutes  -    MPH 2.5  -    METS 3.35  -    RPE 13  -    VO2 Peak 11.7  -    Symptoms No  -    Resting HR 93 bpm  -     Resting BP 142/68  -    Max Ex. HR 110 bpm  -    Max Ex. BP 198/104  -    2 Minute Post BP 150/80  -       Initial Exercise Prescription:     Initial Exercise Prescription - 08/25/15 1400      Date of Initial Exercise RX and Referring Provider   Date 08/25/15   Referring Provider Neoma Laming     Treadmill   MPH 2.5   Grade 0   Minutes 15   METs 2.9     Recumbant Bike   Level 3   RPM 50   Watts 75   Minutes 15     REL-XR   Level 3   Watts 75   Minutes 15   METs 2.8     T5 Nustep   Level 3   Watts 75   Minutes 15   METs 2.8     Prescription Details   Frequency (times per week) 3   Duration Progress to 30 minutes of continuous aerobic without signs/symptoms of physical distress     Intensity   THRR 40-80% of Max Heartrate 126-160   Ratings of Perceived Exertion 11-13   Perceived Dyspnea 0-4     Progression   Progression Continue to progress workloads to maintain intensity without signs/symptoms of physical distress.     Resistance Training   Training Prescription Yes   Weight 7  lifting restriction 30 lb   Reps 10-15      Perform Capillary Blood Glucose checks as needed.  Exercise Prescription Changes:     Exercise Prescription Changes    Row Name 09/08/15 0800 09/21/15 1400           Exercise Review   Progression Yes Yes        Response to Exercise   Blood Pressure (Admit) 150/82 160/90      Blood Pressure (Exercise) 144/78 158/82      Blood Pressure (Exit) 124/76 126/74      Heart Rate (Admit) 86 bpm 82 bpm      Heart Rate (Exercise) 103 bpm 126 bpm      Heart Rate (Exit) 92 bpm 82 bpm      Rating of Perceived Exertion (Exercise) 12 15      Symptoms none none      Duration Progress to 45 minutes of aerobic exercise without signs/symptoms of physical distress Progress to 45 minutes of aerobic exercise without signs/symptoms of physical distress      Intensity THRR unchanged THRR unchanged  Progression   Progression  Continue to progress workloads to maintain intensity without signs/symptoms of physical distress. Continue to progress workloads to maintain intensity without signs/symptoms of physical distress.      Average METs 1.7 2.61        Resistance Training   Training Prescription Yes Yes      Weight 7 7      Reps 10-15 10-15        Interval Training   Interval Training No No        Treadmill   MPH  - 2.5      Grade  - 0      Minutes  - 15      METs  - 2.91        REL-XR   Level 3  -      Minutes 15  -      METs 1.1  -        T5 Nustep   Level 3 3      Minutes 15 15      METs 2.3 2.3         Exercise Comments:     Exercise Comments    Row Name 08/31/15 0759 09/08/15 0849 09/21/15 1445 10/06/15 1133     Exercise Comments First full day of exercise!  Patient was oriented to gym and equipment including functions, settings, policies, and procedures.  Patient's individual exercise prescription and treatment plan were reviewed.  All starting workloads were established based on the results of the 6 minute walk test done at initial orientation visit.  The plan for exercise progression was also introduced and progression will be customized based on patient's performance and goals. Russell Ray is off to a good start with exercise.  He has been on vacation this week.  Unfortunately, he cannot afford to stay the whole program due to insurance copays.  He has already made good progress and we will continue to monitor for progress. Russell Ray has only been in once since last review.  We will continue to monitor for progression when here. Pt has been out since last review.       Discharge Exercise Prescription (Final Exercise Prescription Changes):     Exercise Prescription Changes - 09/21/15 1400      Exercise Review   Progression Yes     Response to Exercise   Blood Pressure (Admit) 160/90   Blood Pressure (Exercise) 158/82   Blood Pressure (Exit) 126/74   Heart Rate (Admit) 82 bpm   Heart Rate  (Exercise) 126 bpm   Heart Rate (Exit) 82 bpm   Rating of Perceived Exertion (Exercise) 15   Symptoms none   Duration Progress to 45 minutes of aerobic exercise without signs/symptoms of physical distress   Intensity THRR unchanged     Progression   Progression Continue to progress workloads to maintain intensity without signs/symptoms of physical distress.   Average METs 2.61     Resistance Training   Training Prescription Yes   Weight 7   Reps 10-15     Interval Training   Interval Training No     Treadmill   MPH 2.5   Grade 0   Minutes 15   METs 2.91     T5 Nustep   Level 3   Minutes 15   METs 2.3      Nutrition:  Target Goals: Understanding of nutrition guidelines, daily intake of sodium <1542m, cholesterol <2081m calories 30% from fat and 7% or less from  saturated fats, daily to have 5 or more servings of fruits and vegetables.  Biometrics:     Pre Biometrics - 08/25/15 1420      Pre Biometrics   Height 5' 11"  (1.803 m)   Weight (!)  392 lb 12.8 oz (178.2 kg)   Waist Circumference 63 inches   Hip Circumference 60 inches   Waist to Hip Ratio 1.05 %   BMI (Calculated) 54.9       Nutrition Therapy Plan and Nutrition Goals:     Nutrition Therapy & Goals - 08/25/15 1256      Nutrition Therapy   Drug/Food Interactions Coumadin/Vit K     Personal Nutrition Goals   Personal Goal #1 Diabetic diet, Coumadin     Intervention Plan   Intervention Prescribe, educate and counsel regarding individualized specific dietary modifications aiming towards targeted core components such as weight, hypertension, lipid management, diabetes, heart failure and other comorbidities.   Expected Outcomes Short Term Goal: Understand basic principles of dietary content, such as calories, fat, sodium, cholesterol and nutrients.;Long Term Goal: Adherence to prescribed nutrition plan.      Nutrition Discharge: Rate Your Plate Scores:     Nutrition Assessments - 10/12/15 1053       Rate Your Plate Scores   Post Score --  Not done early exit      Nutrition Goals Re-Evaluation:   Psychosocial: Target Goals: Acknowledge presence or absence of depression, maximize coping skills, provide positive support system. Participant is able to verbalize types and ability to use techniques and skills needed for reducing stress and depression.  Initial Review & Psychosocial Screening:     Initial Psych Review & Screening - 08/25/15 1517      Initial Review   Comments Russell Ray said he is not sure that he can go back to work what he was doing so will fax to [employer]. Russell Ray is considereding applying for disability too.       Quality of Life Scores:     Quality of Life - 10/12/15 1053      Quality of Life Scores   Health/Function Post --  Not done early exit      PHQ-9: Recent Review Flowsheet Data    Depression screen [facility] 2/9 08/25/2015   Decreased Interest 2   Down, Depressed, Hopeless 1   PHQ - 2 Score 3   Altered sleeping 2   Tired, decreased energy 1   Change in appetite 1   Feeling bad or failure about yourself  1   Trouble concentrating 1   Moving slowly or fidgety/restless 1   Suicidal thoughts 0   PHQ-9 Score 10   Difficult doing work/chores Somewhat difficult      Psychosocial Evaluation and Intervention:     Psychosocial Evaluation - 08/31/15 0942      Psychosocial Evaluation & Interventions   Interventions Stress management education;Relaxation education;Encouraged to exercise with the program and follow exercise prescription   Comments Counselor met with Russell Ray today for initial psychosocial evaluation.  He is a 45 year old with a family history of heart disease.  He had open heart surgery in February of this year and has had multiple surgeries and infections since that time.  His last surgery was 05/31/15.  Russell Ray has a strong support system with a spouse of 8 years, both parents and he is also actively involved in his local church.  He  has other health issues as well with obesity, hernea surgery and diabetes.  Russell Ray states he does not sleep well with intermittently waking up to go to the bathroom.  He states he has been told he likely has sleep apnea and is in need of a sleep study.  Russell Ray reports a history of depression and anxiety since 200t and is on medication PRN for anxiety.  He states this is not completely treating all of his symptoms as he continues to worry and feel anxious quite a bit and admits to some Obsessive Compulsive tendencies.  Counselor encouraged Russell Ray to see his Dr/PCP about possible treatment for this as his mood is impacted currently by these symptoms.  Russell Ray admits to multiple stressors in his life with finances; potential for loss of his job and insurance, and his health.  He has goals to get a baseline to know what is reasonable to work out in the gym while here and then transfer that information to his local gym.  Counselor recommended Russell Ray speak to his Dr. about his frequent urination; a sleep study and possible treatment of depression/anxiety.  counselor provided information to consult with his Dr. on an OTC natural sleep aid to help with his intermittent sleep at this time.  Counselor will follow with Russell Ray during the course of this program.     Continued Psychosocial Services Needed Yes  Mr. Heppler will benefit from the psychoeducational components of this program; especially stress management and relaxation. Counselor taught Mr. [place] to do deep breathing today and encouraged to practice.  He also needs to meet with the dietician.        Psychosocial Re-Evaluation:     Psychosocial Re-Evaluation    [place] Name 10/12/15 1053             Psychosocial Re-Evaluation   Comments Russell Ray called to say that he could not return to Cardiac Rehab since he could not afford the $50 copay each visit. Russell Ray said it was very helpful to talk to the [place] Counselor.            Vocational Rehabilitation: Provide vocational rehab assistance to qualifying candidates.   Vocational Rehab Evaluation & Intervention:     Vocational Rehab - 08/25/15 1245      Initial Vocational Rehab Evaluation & Intervention   Assessment shows need for Vocational Rehabilitation Yes      Education: Education Goals: Education classes will be provided on a weekly basis, covering required topics. Participant will state understanding/return demonstration of topics presented.  Learning Barriers/Preferences:     Learning Barriers/Preferences - 08/25/15 1245      Learning Barriers/Preferences   Learning Barriers None   Learning Preferences Group Instruction;Skilled Demonstration;Written Material      Education Topics: General Nutrition Guidelines/Fats and Fiber: -Group instruction provided by verbal, written material, models and posters to present the general guidelines for heart healthy nutrition. Gives an explanation and review of dietary fats and fiber.   Controlling Sodium/Reading Food Labels: -Group verbal and written material supporting the discussion of sodium use in heart healthy nutrition. Review and explanation with models, verbal and written materials for utilization of the food label.   Exercise Physiology & Risk Factors: - Group verbal and written instruction with models to review the exercise physiology of the cardiovascular system and associated critical values. Details cardiovascular disease risk factors and the goals associated with each risk factor.   Aerobic Exercise & Resistance Training: - Gives group verbal and written discussion on the health impact of inactivity. On  the components of aerobic and resistive training programs and the benefits of this training and how to safely progress through these programs.   Flexibility, Balance, General Exercise Guidelines: - Provides group verbal and written instruction on the benefits of flexibility and  balance training programs. Provides general exercise guidelines with specific guidelines to those with heart or lung disease. Demonstration and skill practice provided.   Stress Management: - Provides group verbal and written instruction about the health risks of elevated stress, cause of high stress, and healthy ways to reduce stress.   Depression: - Provides group verbal and written instruction on the correlation between heart/lung disease and depressed mood, treatment options, and the stigmas associated with seeking treatment.   Anatomy & Physiology of the Heart: - Group verbal and written instruction and models provide basic cardiac anatomy and physiology, with the coronary electrical and arterial systems. Review of: AMI, Angina, Valve disease, Heart Failure, Cardiac Arrhythmia, Pacemakers, and the ICD.   Cardiac Procedures: - Group verbal and written instruction and models to describe the testing methods done to diagnose heart disease. Reviews the outcomes of the test results. Describes the treatment choices: Medical Management, Angioplasty, or Coronary Bypass Surgery. Flowsheet Row Cardiac Rehab from 09/12/2015 in [facility] Cardiac and Pulmonary Rehab  Date  08/31/15  Educator  CE  Instruction Review Code  2- meets goals/outcomes      Cardiac Medications: - Group verbal and written instruction to review commonly prescribed medications for heart disease. Reviews the medication, class of the drug, and side effects. Includes the steps to properly store meds and maintain the prescription regimen. Flowsheet Row Cardiac Rehab from 09/12/2015 in [facility] Cardiac and Pulmonary Rehab  Date  09/12/15  Educator  SB  Instruction Review Code  2- meets goals/outcomes [cardiac meds lecture 2]      Go Sex-Intimacy & Heart Disease, Get SMART - Goal Setting: - Group verbal and written instruction through game format to discuss heart disease and the return to sexual intimacy. Provides group verbal and  written material to discuss and apply goal setting through the application of the S.M.A.R.T. Method. Flowsheet Row Cardiac Rehab from 09/12/2015 in [facility] Cardiac and Pulmonary Rehab  Date  08/31/15  Educator  CE  Instruction Review Code  2- meets goals/outcomes      Other Matters of the Heart: - Provides group verbal, written materials and models to describe Heart Failure, Angina, Valve Disease, and Diabetes in the realm of heart disease. Includes description of the disease process and treatment options available to the cardiac patient.   Exercise & Equipment Safety: - Individual verbal instruction and demonstration of equipment use and safety with use of the equipment. Flowsheet Row Cardiac Rehab from 09/12/2015 in [facility] Cardiac and Pulmonary Rehab  Date  08/25/15  Educator  C. EnterkinRN  Instruction Review Code  1- partially meets, needs review/practice      Infection Prevention: - Provides verbal and written material to individual with discussion of infection control including proper hand washing and proper equipment cleaning during exercise session. Flowsheet Row Cardiac Rehab from 09/12/2015 in [facility] Cardiac and Pulmonary Rehab  Date  08/25/15  Educator  C. [place]  Instruction Review Code  1- partially meets, needs review/practice      Falls Prevention: - Provides verbal and written material to individual with discussion of falls prevention and safety. Flowsheet Row Cardiac Rehab from 09/12/2015 in [facility] Cardiac and Pulmonary Rehab  Date  08/25/15  Educator  C. EnterkinRN  Instruction Review Code  1-  partially meets, needs review/practice      Diabetes: - Individual verbal and written instruction to review signs/symptoms of diabetes, desired ranges of glucose level fasting, after meals and with exercise. Advice that pre and post exercise glucose checks will be done for 3 sessions at entry of program. [place] from 09/12/2015 in [facility] Cardiac and Pulmonary  Rehab  Date  08/25/15  Educator  C. [place]  Instruction Review Code  1- partially meets, needs review/practice       Knowledge Questionnaire Score:     Knowledge Questionnaire Score - 08/25/15 1513      Knowledge Questionnaire Score   Pre Score 25      Core Components/Risk Factors/Patient Goals at Admission:     Personal Goals and Risk Factors at Admission - 08/25/15 1257      Core Components/Risk Factors/Patient Goals on Admission    Weight Management Yes;Obesity   Intervention Weight Management: Develop a combined nutrition and exercise program designed to reach desired caloric intake, while maintaining appropriate intake of nutrient and fiber, sodium and fats, and appropriate energy expenditure required for the weight goal.;Weight Management: Provide education and appropriate resources to help participant work on and attain dietary goals.;Weight Management/Obesity: Establish reasonable short term and long term weight goals.;Obesity: Provide education and appropriate resources to help participant work on and attain dietary goals.   Admit Weight 392 lb (177.8 kg)   Expected Outcomes Short Term: Continue to assess and modify interventions until short term weight is achieved;Weight Maintenance: Understanding of the daily nutrition guidelines, which includes 25-35% calories from fat, 7% or less cal from saturated fats, less than 264m cholesterol, less than 1.5gm of sodium, & 5 or more servings of fruits and vegetables daily;Weight Loss: Understanding of general recommendations for a balanced deficit meal plan, which promotes 1-2 lb weight loss per week and includes a negative energy balance of [phone] kcal/d;Understanding recommendations for meals to include 15-35% energy as protein, 25-35% energy from fat, 35-60% energy from carbohydrates, less than 2057mof dietary cholesterol, 20-35 gm of total fiber daily;Understanding of distribution of calorie intake throughout the day with the  consumption of 4-5 meals/snacks;Weight Gain: Understanding of general recommendations for a high calorie, high protein meal plan that promotes weight gain by distributing calorie intake throughout the day with the consumption for 4-5 meals, snacks, and/or supplements   Sedentary Yes   Intervention Provide advice, education, support and counseling about physical activity/exercise needs.;Develop an individualized exercise prescription for aerobic and resistive training based on initial evaluation findings, risk stratification, comorbidities and participant's personal goals.   Expected Outcomes Achievement of increased cardiorespiratory fitness and enhanced flexibility, muscular endurance and strength shown through measurements of functional capacity and personal statement of participant.   Stress Yes   Intervention Offer individual and/or small group education and counseling on adjustment to heart disease, stress management and health-related lifestyle change. Teach and support self-help strategies.;Refer participants experiencing significant psychosocial distress to appropriate mental health specialists for further evaluation and treatment. When possible, include family members and significant others in education/counseling sessions.   Expected Outcomes Short Term: Participant demonstrates changes in health-related behavior, relaxation and other stress management skills, ability to obtain effective social support, and compliance with psychotropic medications if prescribed.;Long Term: Emotional wellbeing is indicated by absence of clinically significant psychosocial distress or social isolation.      Core Components/Risk Factors/Patient Goals Review:      Goals and Risk Factor Review    Row Name 09/12/15 1517 10/12/15 1054  Core Components/Risk Factors/Patient Goals Review   Personal Goals Review Weight Management/Obesity;Sedentary;Increase Strength and Stamina;Diabetes;Tobacco  Cessation;Stress;Lipids;Hypertension;Heart Failure  -      Review Russell Ray is off to a good start with rehab.  He is only able to attend for a few weeks due to his copay.  He is not exercising at home yet, but does want to add it.  His weight is back up some today after indulging in [place] over the weekend.  He has still not had a cigarrette, but is still craving them.  His blood sugars are doing good but he is sitll getting dizzy and nausea spells.  Lipids are good. Stress is still a contributing factor but he has a good support through church and wife. Russell Ray called to say that he could not return to Cardiac Rehab since he could not afford the $50 copay each visit. Russell Ray said it was very helpful to talk to the [place] Counselor. Russell Ray stated he is applying for disability.       Expected Outcomes Xavyer will continue to exercise and come to class.  We will encourage better diet choices for overall improvement.  We will continue to monitor for progress. Cont with healthier lifestyle         Core Components/Risk Factors/Patient Goals at Discharge (Final Review):      Goals and Risk Factor Review - 10/12/15 1054      Core Components/Risk Factors/Patient Goals Review   Review Russell Ray Weatherly called to say that he could not return to Cardiac Rehab since he could not afford the $50 copay each visit. Dunbar said it was very helpful to talk to the [place] Counselor. Keidan stated he is applying for disability.    Expected Outcomes Cont with healthier lifestyle      ITP Comments:     ITP Comments    Row Name 08/25/15 1256 08/25/15 1305 08/25/15 1511 08/25/15 1515 08/25/15 1517   ITP Comments Doc said he has always been overweight. On Coumadin. Last HBA1c was 6.6 so recently started on Metformin. Stomach issues from abd flap to cover aortic valve replacement surgery.  Very stressed due to bills, $50 copay for Cardiac Rehab, Very stressed due to 4 surgery in 7 day Feb 28,th,  Debrid March 31st, 3rd surgery for infection and more debridment May 29, 2015 and then 4th surgery for Omental Flap to put good tissue in open area in open heart surgery for Aortic Valve replacement April  4th, 2017. 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 Wife is a teacher-"I told her if she wanted to leave me know since I have had 4 surgeries. Father is Joeseph Verville who was in Cardiac Rehab. Nosson said he has talked with his minister and Cole said he is a man of God and a Darrick Meigs but is stressed now.  Rhys said he is not sure that he can go back to work what he was doing so will fax to [employer]. Dick is considereding applying for disability too.    Row Name 09/14/15 0747 10/06/15 1133 10/12/15 0803 10/12/15 1052     ITP Comments 30 day review. Continue with ITP Pt has been out since last review.  Left message on mobile.  30 day review. Continue with ITP unless changes noted by Medical Director at signature of review. Maleek Craver called to say that he could not return to Cardiac Rehab since he could not afford the $50 copay each visit. Jailan said it was very helpful to talk to the [place] Counselor.        Comments: Rydan Gulyas called to say that he could not return to Cardiac Rehab since he could not afford the $50 copay each visit. Paras said it was very helpful to talk to the [place] Counselor.

## 2015-10-12 NOTE — Telephone Encounter (Signed)
Russell Ray called to say that he could not return to Cardiac Rehab since he could not afford the $50 copay each visit. Russell Ray said it was very helpful to talk to the [place] Counselor.

## 2015-10-12 NOTE — Progress Notes (Signed)
Discharge Summary  Patient Details  Name: Russell Ray MRN: [id number] Date of Birth: 1971/01/24 Referring Provider:   Flowsheet Row Cardiac Rehab from 08/25/2015 in [facility]  Referring Provider  Neoma Laming       Number of Visits: Early exit due to $50 copay/visit  Reason for Discharge:  Early Exit:  Insurance  Smoking History:  History  Smoking Status  . Former Smoker  . Types: Cigarettes  Smokeless Tobacco  . Not on file    Diagnosis:  S/P aortic valve replacement  ADL UCSD:   Initial Exercise Prescription:     Initial Exercise Prescription - 08/25/15 1400      Date of Initial Exercise RX and Referring Provider   Date 08/25/15   Referring Provider Neoma Laming     Treadmill   MPH 2.5   Grade 0   Minutes 15   METs 2.9     Recumbant Bike   Level 3   RPM 50   Watts 75   Minutes 15     REL-XR   Level 3   Watts 75   Minutes 15   METs 2.8     T5 Nustep   Level 3   Watts 75   Minutes 15   METs 2.8     Prescription Details   Frequency (times per week) 3   Duration Progress to 30 minutes of continuous aerobic without signs/symptoms of physical distress     Intensity   THRR 40-80% of Max Heartrate 126-160   Ratings of Perceived Exertion 11-13   Perceived Dyspnea 0-4     Progression   Progression Continue to progress workloads to maintain intensity without signs/symptoms of physical distress.     Resistance Training   Training Prescription Yes   Weight 7  lifting restriction 30 lb   Reps 10-15      Discharge Exercise Prescription (Final Exercise Prescription Changes):     Exercise Prescription Changes - 09/21/15 1400      Exercise Review   Progression Yes     Response to Exercise   Blood Pressure (Admit) 160/90   Blood Pressure (Exercise) 158/82   Blood Pressure (Exit) 126/74   Heart Rate (Admit) 82 bpm   Heart Rate (Exercise) 126 bpm   Heart Rate (Exit) 82 bpm   Rating of Perceived Exertion (Exercise) 15    Symptoms none   Duration Progress to 45 minutes of aerobic exercise without signs/symptoms of physical distress   Intensity THRR unchanged     Progression   Progression Continue to progress workloads to maintain intensity without signs/symptoms of physical distress.   Average METs 2.61     Resistance Training   Training Prescription Yes   Weight 7   Reps 10-15     Interval Training   Interval Training No     Treadmill   MPH 2.5   Grade 0   Minutes 15   METs 2.91     T5 Nustep   Level 3   Minutes 15   METs 2.3      Functional Capacity:     6 Minute Walk    Row Name 08/25/15 1424 10/12/15 1053       6 Minute Walk   Distance 1325 feet -  Discharge 6 min walk not done early exit.    Walk Time 6 minutes  -    MPH 2.5  -    METS 3.35  -    RPE 13  -  VO2 Peak 11.7  -    Symptoms No  -    Resting HR 93 bpm  -    Resting BP 142/68  -    Max Ex. HR 110 bpm  -    Max Ex. BP 198/104  -    2 Minute Post BP 150/80  -       Psychological, QOL, Others - Outcomes: PHQ 2/9: Depression screen PHQ 2/9 08/25/2015  Decreased Interest 2  Down, Depressed, Hopeless 1  PHQ - 2 Score 3  Altered sleeping 2  Tired, decreased energy 1  Change in appetite 1  Feeling bad or failure about yourself  1  Trouble concentrating 1  Moving slowly or fidgety/restless 1  Suicidal thoughts 0  PHQ-9 Score 10  Difficult doing work/chores Somewhat difficult    Quality of Life:     Quality of Life - 10/12/15 1053      Quality of Life Scores   Health/Function Post --  Not done early exit      Personal Goals: Goals established at orientation with interventions provided to work toward goal.     Personal Goals and Risk Factors at Admission - 08/25/15 1257      Core Components/Risk Factors/Patient Goals on Admission    Weight Management Yes;Obesity   Intervention Weight Management: Develop a combined nutrition and exercise program designed to reach desired caloric intake,  while maintaining appropriate intake of nutrient and fiber, sodium and fats, and appropriate energy expenditure required for the weight goal.;Weight Management: Provide education and appropriate resources to help participant work on and attain dietary goals.;Weight Management/Obesity: Establish reasonable short term and long term weight goals.;Obesity: Provide education and appropriate resources to help participant work on and attain dietary goals.   Admit Weight 392 lb (177.8 kg)   Expected Outcomes Short Term: Continue to assess and modify interventions until short term weight is achieved;Weight Maintenance: Understanding of the daily nutrition guidelines, which includes 25-35% calories from fat, 7% or less cal from saturated fats, less than 200mg  cholesterol, less than 1.5gm of sodium, & 5 or more servings of fruits and vegetables daily;Weight Loss: Understanding of general recommendations for a balanced deficit meal plan, which promotes 1-2 lb weight loss per week and includes a negative energy balance of [phone] kcal/d;Understanding recommendations for meals to include 15-35% energy as protein, 25-35% energy from fat, 35-60% energy from carbohydrates, less than 200mg  of dietary cholesterol, 20-35 gm of total fiber daily;Understanding of distribution of calorie intake throughout the day with the consumption of 4-5 meals/snacks;Weight Gain: Understanding of general recommendations for a high calorie, high protein meal plan that promotes weight gain by distributing calorie intake throughout the day with the consumption for 4-5 meals, snacks, and/or supplements   Sedentary Yes   Intervention Provide advice, education, support and counseling about physical activity/exercise needs.;Develop an individualized exercise prescription for aerobic and resistive training based on initial evaluation findings, risk stratification, comorbidities and participant's personal goals.   Expected Outcomes Achievement of  increased cardiorespiratory fitness and enhanced flexibility, muscular endurance and strength shown through measurements of functional capacity and personal statement of participant.   Stress Yes   Intervention Offer individual and/or small group education and counseling on adjustment to heart disease, stress management and health-related lifestyle change. Teach and support self-help strategies.;Refer participants experiencing significant psychosocial distress to appropriate mental health specialists for further evaluation and treatment. When possible, include family members and significant others in education/counseling sessions.   Expected Outcomes Short Term: Participant demonstrates  changes in health-related behavior, relaxation and other stress management skills, ability to obtain effective social support, and compliance with psychotropic medications if prescribed.;Long Term: Emotional wellbeing is indicated by absence of clinically significant psychosocial distress or social isolation.       Personal Goals Discharge:     Goals and Risk Factor Review    Row Name 09/12/15 1517 10/12/15 1054           Core Components/Risk Factors/Patient Goals Review   Personal Goals Review Weight Management/Obesity;Sedentary;Increase Strength and Stamina;Diabetes;Tobacco Cessation;Stress;Lipids;Hypertension;Heart Failure  -      Review Russell Ray is off to a good start with rehab.  He is only able to attend for a few weeks due to his copay.  He is not exercising at home yet, but does want to add it.  His weight is back up some today after indulging in [place] over the weekend.  He has still not had a cigarrette, but is still craving them.  His blood sugars are doing good but he is sitll getting dizzy and nausea spells.  Lipids are good. Stress is still a contributing factor but he has a good support through church and wife. Chandler Andersen called to say that he could not return to Cardiac Rehab since he could not  afford the $50 copay each visit. Daler said it was very helpful to talk to the [place] Counselor. Arnesh stated he is applying for disability.       Expected Outcomes Verden will continue to exercise and come to class.  We will encourage better diet choices for overall improvement.  We will continue to monitor for progress. Cont with healthier lifestyle         Nutrition & Weight - Outcomes:     Pre Biometrics - 08/25/15 1420      Pre Biometrics   Height 5\' 11"  (1.803 m)   Weight (!)  392 lb 12.8 oz (178.2 kg)   Waist Circumference 63 inches   Hip Circumference 60 inches   Waist to Hip Ratio 1.05 %   BMI (Calculated) 54.9       Nutrition:     Nutrition Therapy & Goals - 08/25/15 1256      Nutrition Therapy   Drug/Food Interactions Coumadin/Vit K     Personal Nutrition Goals   Personal Goal #1 Diabetic diet, Coumadin     Intervention Plan   Intervention Prescribe, educate and counsel regarding individualized specific dietary modifications aiming towards targeted core components such as weight, hypertension, lipid management, diabetes, heart failure and other comorbidities.   Expected Outcomes Short Term Goal: Understand basic principles of dietary content, such as calories, fat, sodium, cholesterol and nutrients.;Long Term Goal: Adherence to prescribed nutrition plan.      Nutrition Discharge:     Nutrition Assessments - 10/12/15 1053      Rate Your Plate Scores   Post Score --  Not done early exit      Education Questionnaire Score:     Knowledge Questionnaire Score - 08/25/15 1513      Knowledge Questionnaire Score   Pre Score 25      Goals reviewed with patient; copy given to patient.

## 2015-10-12 NOTE — Progress Notes (Signed)
Cardiac Individual Treatment Plan  Patient Details  Name: Russell Ray MRN: [id number] Date of Birth: 1971-02-11 Referring Provider:   Flowsheet Row Cardiac Rehab from 08/25/2015 in [facility] Cardiac and Pulmonary Rehab  Referring Provider  Neoma Laming      Initial Encounter Date:  Flowsheet Row Cardiac Rehab from 08/25/2015 in [facility] Cardiac and Pulmonary Rehab  Date  08/25/15  Referring Provider  Neoma Laming      Visit Diagnosis: S/P aortic valve replacement  Patient's Home Medications on Admission:  Current Outpatient Prescriptions:  .  acetaminophen (TYLENOL) 325 MG tablet, Take 650 mg by mouth every 4 (four) hours as needed for moderate pain., Disp: , Rfl:  .  amiodarone (PACERONE) 400 MG tablet, Take 1 tablet (400 mg total) by mouth 2 (two) times daily., Disp: 60 tablet, Rfl: 2 .  aspirin EC 81 MG tablet, Take by mouth., Disp: , Rfl:  .  cyanocobalamin 1000 MCG tablet, Take 1,000 mcg by mouth daily., Disp: , Rfl:  .  cyclobenzaprine (FLEXERIL) 5 MG tablet, Take 5 mg by mouth 3 (three) times daily as needed., Disp: , Rfl:  .  furosemide (LASIX) 40 MG tablet, Take 1 tablet (40 mg total) by mouth daily. (Patient taking differently: Take 40 mg by mouth 2 (two) times daily. ), Disp: 30 tablet, Rfl: 6 .  KLOR-CON M20 20 MEQ tablet, Take 1 tablet by mouth 2 (two) times daily., Disp: , Rfl:  .  LORazepam (ATIVAN) 1 MG tablet, Take 1 mg by mouth 2 (two) times daily., Disp: , Rfl: 2 .  metFORMIN (GLUCOPHAGE) 500 MG tablet, Take 500 mg by mouth 2 (two) times daily., Disp: , Rfl: 0 .  metoprolol (LOPRESSOR) 50 MG tablet, Take 1 tablet by mouth 2 (two) times daily., Disp: , Rfl:  .  nicotine (NICODERM CQ - DOSED IN MG/24 HOURS) 14 mg/24hr patch, Place 14 mg onto the skin daily., Disp: , Rfl:  .  oxyCODONE (OXY IR/ROXICODONE) 5 MG immediate release tablet, Take 1 tablet by mouth every 4 (four) hours as needed., Disp: , Rfl:  .  polyethylene glycol (MIRALAX / GLYCOLAX) packet, Take 17 g by mouth  daily as needed., Disp: , Rfl:  .  senna-docusate (SENOKOT-S) 8.6-50 MG tablet, Take 1 tablet by mouth at bedtime as needed for mild constipation., Disp: 30 tablet, Rfl: 6 .  warfarin (COUMADIN) 2 MG tablet, Take 3 tablets (6 mg total) by mouth at bedtime., Disp: 90 tablet, Rfl: 5  Past Medical History: Past Medical History:  Diagnosis Date  . Anxiety   . Atrial fibrillation ([place])   . Diabetes mellitus without complication ([place])   . Hypertension     Tobacco Use: History  Smoking Status  . Former Smoker  . Types: Cigarettes  Smokeless Tobacco  . Not on file    Labs: Recent Review Flowsheet Data    Labs for ITP Cardiac and Pulmonary Rehab Latest Ref Rng & Units 03/18/2015 04/22/2015 05/03/2015   Hemoglobin A1c 4.0 - 6.0 % 5.7 5.8 6.2(H)       Exercise Target Goals:    Exercise Program Goal: Individual exercise prescription set with THRR, safety & activity barriers. Participant demonstrates ability to understand and report RPE using BORG scale, to self-measure pulse accurately, and to acknowledge the importance of the exercise prescription.  Exercise Prescription Goal: Starting with aerobic activity 30 plus minutes a day, 3 days per week for initial exercise prescription. Provide home exercise prescription and guidelines that participant acknowledges understanding prior to discharge.  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    Cardiac Risk Stratification High      6 Minute Walk:     6 Minute Walk    Row Name 08/25/15 1424         6 Minute Walk   Distance 1325 feet     Walk Time 6 minutes     MPH 2.5     METS 3.35     RPE 13     VO2 Peak 11.7     Symptoms No     Resting HR 93 bpm     Resting BP 142/68     Max Ex. HR 110 bpm     Max Ex. BP 198/104      2 Minute Post BP 150/80        Initial Exercise Prescription:     Initial Exercise Prescription - 08/25/15 1400      Date of Initial Exercise RX and Referring Provider   Date 08/25/15   Referring Provider Neoma Laming     Treadmill   MPH 2.5   Grade 0   Minutes 15   METs 2.9     Recumbant Bike   Level 3   RPM 50   Watts 75   Minutes 15     REL-XR   Level 3   Watts 75   Minutes 15   METs 2.8     T5 Nustep   Level 3   Watts 75   Minutes 15   METs 2.8     Prescription Details   Frequency (times per week) 3   Duration Progress to 30 minutes of continuous aerobic without signs/symptoms of physical distress     Intensity   THRR 40-80% of Max Heartrate 126-160   Ratings of Perceived Exertion 11-13   Perceived Dyspnea 0-4     Progression   Progression Continue to progress workloads to maintain intensity without signs/symptoms of physical distress.     Resistance Training   Training Prescription Yes   Weight 7  lifting restriction 30 lb   Reps 10-15      Perform Capillary Blood Glucose checks as needed.  Exercise Prescription Changes:     Exercise Prescription Changes    Row Name 09/08/15 0800 09/21/15 1400           Exercise Review   Progression Yes Yes        Response to Exercise   Blood Pressure (Admit) 150/82 160/90      Blood Pressure (Exercise) 144/78 158/82      Blood Pressure (Exit) 124/76 126/74      Heart Rate (Admit) 86 bpm 82 bpm      Heart Rate (Exercise) 103 bpm 126 bpm      Heart Rate (Exit) 92 bpm 82 bpm      Rating of Perceived Exertion (Exercise) 12 15      Symptoms none none      Duration Progress to 45 minutes of aerobic exercise without signs/symptoms of physical distress Progress to 45 minutes of aerobic exercise without signs/symptoms of physical distress      Intensity THRR unchanged THRR unchanged        Progression   Progression Continue to progress workloads to maintain intensity without signs/symptoms of  physical distress. Continue to progress workloads to maintain intensity without signs/symptoms of physical distress.      Average METs 1.7 2.61        Resistance Training   Training Prescription Yes Yes      Weight 7 7      Reps 10-15 10-15        Interval Training   Interval Training No No        Treadmill   MPH  - 2.5      Grade  - 0      Minutes  - 15      METs  - 2.91        REL-XR   Level 3  -      Minutes 15  -      METs 1.1  -        T5 Nustep   Level 3 3      Minutes 15 15      METs 2.3 2.3         Exercise Comments:     Exercise Comments    Row Name 08/31/15 0759 09/08/15 0849 09/21/15 1445 10/06/15 1133     Exercise Comments First full day of exercise!  Patient was oriented to gym and equipment including functions, settings, policies, and procedures.  Patient's individual exercise prescription and treatment plan were reviewed.  All starting workloads were established based on the results of the 6 minute walk test done at initial orientation visit.  The plan for exercise progression was also introduced and progression will be customized based on patient's performance and goals. Dereke is off to a good start with exercise.  He has been on vacation this week.  Unfortunately, he cannot afford to stay the whole program due to insurance copays.  He has already made good progress and we will continue to monitor for progress. Keigen has only been in once since last review.  We will continue to monitor for progression when here. Pt has been out since last review.       Discharge Exercise Prescription (Final Exercise Prescription Changes):     Exercise Prescription Changes - 09/21/15 1400      Exercise Review   Progression Yes     Response to Exercise   Blood Pressure (Admit) 160/90   Blood Pressure (Exercise) 158/82   Blood Pressure (Exit) 126/74   Heart Rate (Admit) 82 bpm   Heart Rate (Exercise) 126 bpm   Heart Rate (Exit) 82 bpm   Rating of Perceived Exertion  (Exercise) 15   Symptoms none   Duration Progress to 45 minutes of aerobic exercise without signs/symptoms of physical distress   Intensity THRR unchanged     Progression   Progression Continue to progress workloads to maintain intensity without signs/symptoms of physical distress.   Average METs 2.61     Resistance Training   Training Prescription Yes   Weight 7   Reps 10-15     Interval Training   Interval Training No     Treadmill   MPH 2.5   Grade 0   Minutes 15   METs 2.91     T5 Nustep   Level 3   Minutes 15   METs 2.3      Nutrition:  Target Goals: Understanding of nutrition guidelines, daily intake of sodium '1500mg'$ , cholesterol '200mg'$ , calories 30% from fat and 7% or less from saturated fats, daily to have 5 or more servings of fruits and vegetables.  Biometrics:  Pre Biometrics - 08/25/15 1420      Pre Biometrics   Height 5\' 11"  (1.803 m)   Weight (!)  392 lb 12.8 oz (178.2 kg)   Waist Circumference 63 inches   Hip Circumference 60 inches   Waist to Hip Ratio 1.05 %   BMI (Calculated) 54.9       Nutrition Therapy Plan and Nutrition Goals:     Nutrition Therapy & Goals - 08/25/15 1256      Nutrition Therapy   Drug/Food Interactions Coumadin/Vit K     Personal Nutrition Goals   Personal Goal #1 Diabetic diet, Coumadin     Intervention Plan   Intervention Prescribe, educate and counsel regarding individualized specific dietary modifications aiming towards targeted core components such as weight, hypertension, lipid management, diabetes, heart failure and other comorbidities.   Expected Outcomes Short Term Goal: Understand basic principles of dietary content, such as calories, fat, sodium, cholesterol and nutrients.;Long Term Goal: Adherence to prescribed nutrition plan.      Nutrition Discharge: Rate Your Plate Scores:     Nutrition Assessments - 10/10/15 1628      Rate Your Plate Scores   Pre Score 52   Pre Score % 57.7 %       Nutrition Goals Re-Evaluation:   Psychosocial: Target Goals: Acknowledge presence or absence of depression, maximize coping skills, provide positive support system. Participant is able to verbalize types and ability to use techniques and skills needed for reducing stress and depression.  Initial Review & Psychosocial Screening:     Initial Psych Review & Screening - 08/25/15 1517      Initial Review   Comments Ovid said he is not sure that he can go back to work what he was doing so will fax to Vonna Kotyk. Keithan is considereding applying for disability too.       Quality of Life Scores:     Quality of Life - 08/25/15 1513      Quality of Life Scores   Health/Function Pre 15.73 %   Socioeconomic Pre 20.93 %   Psych/Spiritual Pre 19.36 %   Family Pre 21 %   GLOBAL Pre 18.24 %      PHQ-9: Recent Review Flowsheet Data    Depression screen [facility] 2/9 08/25/2015   Decreased Interest 2   Down, Depressed, Hopeless 1   PHQ - 2 Score 3   Altered sleeping 2   Tired, decreased energy 1   Change in appetite 1   Feeling bad or failure about yourself  1   Trouble concentrating 1   Moving slowly or fidgety/restless 1   Suicidal thoughts 0   PHQ-9 Score 10   Difficult doing work/chores Somewhat difficult      Psychosocial Evaluation and Intervention:     Psychosocial Evaluation - 08/31/15 0942      Psychosocial Evaluation & Interventions   Interventions Stress management education;Relaxation education;Encouraged to exercise with the program and follow exercise prescription   Comments Counselor met with Mr. Paprocki today for initial psychosocial evaluation.  He is a 45 year old with a family history of heart disease.  He had open heart surgery in February of this year and has had multiple surgeries and infections since that time.  His last surgery was 05/31/15.  Mr. Antonacci has a strong support system with a spouse of 8 years, both parents and he is also actively involved in his local  church.  He has other health issues as well with obesity, hernea  surgery and diabetes.  Mr. Manninen states he does not sleep well with intermittently waking up to go to the bathroom.  He states he has been told he likely has sleep apnea and is in need of a sleep study.  Mr. Fragoso reports a history of depression and anxiety since 200t and is on medication PRN for anxiety.  He states this is not completely treating all of his symptoms as he continues to worry and feel anxious quite a bit and admits to some Obsessive Compulsive tendencies.  Counselor encouraged Mr. Lipkin to see his Dr/PCP about possible treatment for this as his mood is impacted currently by these symptoms.  Mr. Ohlin admits to multiple stressors in his life with finances; potential for loss of his job and insurance, and his health.  He has goals to get a baseline to know what is reasonable to work out in the gym while here and then transfer that information to his local gym.  Counselor recommended Mr. Naclerio speak to his Dr. about his frequent urination; a sleep study and possible treatment of depression/anxiety.  counselor provided information to consult with his Dr. on an OTC natural sleep aid to help with his intermittent sleep at this time.  Counselor will follow with Mr. Bozard during the course of this program.     Continued Psychosocial Services Needed Yes  Mr. Camero will benefit from the psychoeducational components of this program; especially stress management and relaxation. Counselor taught Mr. Reinoso to do deep breathing today and encouraged to practice.  He also needs to meet with the dietician.        Psychosocial Re-Evaluation:   Vocational Rehabilitation: Provide vocational rehab assistance to qualifying candidates.   Vocational Rehab Evaluation & Intervention:     Vocational Rehab - 08/25/15 1245      Initial Vocational Rehab Evaluation & Intervention   Assessment shows need for Vocational Rehabilitation Yes       Education: Education Goals: Education classes will be provided on a weekly basis, covering required topics. Participant will state understanding/return demonstration of topics presented.  Learning Barriers/Preferences:     Learning Barriers/Preferences - 08/25/15 1245      Learning Barriers/Preferences   Learning Barriers None   Learning Preferences Group Instruction;Skilled Demonstration;Written Material      Education Topics: General Nutrition Guidelines/Fats and Fiber: -Group instruction provided by verbal, written material, models and posters to present the general guidelines for heart healthy nutrition. Gives an explanation and review of dietary fats and fiber.   Controlling Sodium/Reading Food Labels: -Group verbal and written material supporting the discussion of sodium use in heart healthy nutrition. Review and explanation with models, verbal and written materials for utilization of the food label.   Exercise Physiology & Risk Factors: - Group verbal and written instruction with models to review the exercise physiology of the cardiovascular system and associated critical values. Details cardiovascular disease risk factors and the goals associated with each risk factor.   Aerobic Exercise & Resistance Training: - Gives group verbal and written discussion on the health impact of inactivity. On the components of aerobic and resistive training programs and the benefits of this training and how to safely progress through these programs.   Flexibility, Balance, General Exercise Guidelines: - Provides group verbal and written instruction on the benefits of flexibility and balance training programs. Provides general exercise guidelines with specific guidelines to those with heart or lung disease. Demonstration and skill practice provided.   Stress Management: - Provides group verbal  and written instruction about the health risks of elevated stress, cause of high stress, and  healthy ways to reduce stress.   Depression: - Provides group verbal and written instruction on the correlation between heart/lung disease and depressed mood, treatment options, and the stigmas associated with seeking treatment.   Anatomy & Physiology of the Heart: - Group verbal and written instruction and models provide basic cardiac anatomy and physiology, with the coronary electrical and arterial systems. Review of: AMI, Angina, Valve disease, Heart Failure, Cardiac Arrhythmia, Pacemakers, and the ICD.   Cardiac Procedures: - Group verbal and written instruction and models to describe the testing methods done to diagnose heart disease. Reviews the outcomes of the test results. Describes the treatment choices: Medical Management, Angioplasty, or Coronary Bypass Surgery. Flowsheet Row Cardiac Rehab from 09/12/2015 in [facility] Cardiac and Pulmonary Rehab  Date  08/31/15  Educator  CE  Instruction Review Code  2- meets goals/outcomes      Cardiac Medications: - Group verbal and written instruction to review commonly prescribed medications for heart disease. Reviews the medication, class of the drug, and side effects. Includes the steps to properly store meds and maintain the prescription regimen. Flowsheet Row Cardiac Rehab from 09/12/2015 in [facility] Cardiac and Pulmonary Rehab  Date  09/12/15  Educator  SB  Instruction Review Code  2- meets goals/outcomes [cardiac meds lecture 2]      Go Sex-Intimacy & Heart Disease, Get SMART - Goal Setting: - Group verbal and written instruction through game format to discuss heart disease and the return to sexual intimacy. Provides group verbal and written material to discuss and apply goal setting through the application of the S.M.A.R.T. Method. Flowsheet Row Cardiac Rehab from 09/12/2015 in [facility] Cardiac and Pulmonary Rehab  Date  08/31/15  Educator  CE  Instruction Review Code  2- meets goals/outcomes      Other Matters of the Heart: - Provides  group verbal, written materials and models to describe Heart Failure, Angina, Valve Disease, and Diabetes in the realm of heart disease. Includes description of the disease process and treatment options available to the cardiac patient.   Exercise & Equipment Safety: - Individual verbal instruction and demonstration of equipment use and safety with use of the equipment. Flowsheet Row Cardiac Rehab from 09/12/2015 in [facility] Cardiac and Pulmonary Rehab  Date  08/25/15  Educator  C. EnterkinRN  Instruction Review Code  1- partially meets, needs review/practice      Infection Prevention: - Provides verbal and written material to individual with discussion of infection control including proper hand washing and proper equipment cleaning during exercise session. Flowsheet Row Cardiac Rehab from 09/12/2015 in [facility] Cardiac and Pulmonary Rehab  Date  08/25/15  Educator  C. [place]wick  Instruction Review Code  1- partially meets, needs review/practice      Falls Prevention: - Provides verbal and written material to individual with discussion of falls prevention and safety. Flowsheet Row Cardiac Rehab from 09/12/2015 in [facility] Cardiac and Pulmonary Rehab  Date  08/25/15  Educator  C. Stephens  Instruction Review Code  1- partially meets, needs review/practice      Diabetes: - Individual verbal and written instruction to review signs/symptoms of diabetes, desired ranges of glucose level fasting, after meals and with exercise. Advice that pre and post exercise glucose checks will be done for 3 sessions at entry of program. [place] from 09/12/2015 in [facility] Cardiac and Pulmonary Rehab  Date  08/25/15  Educator  C. EnterkinRN  Instruction Review Code  1- partially meets, needs review/practice       Knowledge Questionnaire Score:     Knowledge Questionnaire Score - 08/25/15 1513      Knowledge Questionnaire Score   Pre Score 25      Core Components/Risk Factors/Patient  Goals at Admission:     Personal Goals and Risk Factors at Admission - 08/25/15 1257      Core Components/Risk Factors/Patient Goals on Admission    Weight Management Yes;Obesity   Intervention Weight Management: Develop a combined nutrition and exercise program designed to reach desired caloric intake, while maintaining appropriate intake of nutrient and fiber, sodium and fats, and appropriate energy expenditure required for the weight goal.;Weight Management: Provide education and appropriate resources to help participant work on and attain dietary goals.;Weight Management/Obesity: Establish reasonable short term and long term weight goals.;Obesity: Provide education and appropriate resources to help participant work on and attain dietary goals.   Admit Weight 392 lb (177.8 kg)   Expected Outcomes Short Term: Continue to assess and modify interventions until short term weight is achieved;Weight Maintenance: Understanding of the daily nutrition guidelines, which includes 25-35% calories from fat, 7% or less cal from saturated fats, less than '200mg'$  cholesterol, less than 1.5gm of sodium, & 5 or more servings of fruits and vegetables daily;Weight Loss: Understanding of general recommendations for a balanced deficit meal plan, which promotes 1-2 lb weight loss per week and includes a negative energy balance of [phone] kcal/d;Understanding recommendations for meals to include 15-35% energy as protein, 25-35% energy from fat, 35-60% energy from carbohydrates, less than '200mg'$  of dietary cholesterol, 20-35 gm of total fiber daily;Understanding of distribution of calorie intake throughout the day with the consumption of 4-5 meals/snacks;Weight Gain: Understanding of general recommendations for a high calorie, high protein meal plan that promotes weight gain by distributing calorie intake throughout the day with the consumption for 4-5 meals, snacks, and/or supplements   Sedentary Yes   Intervention Provide  advice, education, support and counseling about physical activity/exercise needs.;Develop an individualized exercise prescription for aerobic and resistive training based on initial evaluation findings, risk stratification, comorbidities and participant's personal goals.   Expected Outcomes Achievement of increased cardiorespiratory fitness and enhanced flexibility, muscular endurance and strength shown through measurements of functional capacity and personal statement of participant.   Stress Yes   Intervention Offer individual and/or small group education and counseling on adjustment to heart disease, stress management and health-related lifestyle change. Teach and support self-help strategies.;Refer participants experiencing significant psychosocial distress to appropriate mental health specialists for further evaluation and treatment. When possible, include family members and significant others in education/counseling sessions.   Expected Outcomes Short Term: Participant demonstrates changes in health-related behavior, relaxation and other stress management skills, ability to obtain effective social support, and compliance with psychotropic medications if prescribed.;Long Term: Emotional wellbeing is indicated by absence of clinically significant psychosocial distress or social isolation.      Core Components/Risk Factors/Patient Goals Review:      Goals and Risk Factor Review    Row Name 09/12/15 1517             Core Components/Risk Factors/Patient Goals Review   Personal Goals Review Weight Management/Obesity;Sedentary;Increase Strength and Stamina;Diabetes;Tobacco Cessation;Stress;Lipids;Hypertension;Heart Failure       Review Dezmen is off to a good start with rehab.  He is only able to attend for a few weeks due to his copay.  He is not exercising at home yet, but does want to add it.  His weight is back up some today after indulging in Bent over the weekend.  He has still not had  a cigarrette, but is still craving them.  His blood sugars are doing good but he is sitll getting dizzy and nausea spells.  Lipids are good. Stress is still a contributing factor but he has a good support through church and wife.       Expected Outcomes Jatavius will continue to exercise and come to class.  We will encourage better diet choices for overall improvement.  We will continue to monitor for progress.          Core Components/Risk Factors/Patient Goals at Discharge (Final Review):      Goals and Risk Factor Review - 09/12/15 1517      Core Components/Risk Factors/Patient Goals Review   Personal Goals Review Weight Management/Obesity;Sedentary;Increase Strength and Stamina;Diabetes;Tobacco Cessation;Stress;Lipids;Hypertension;Heart Failure   Review Coe is off to a good start with rehab.  He is only able to attend for a few weeks due to his copay.  He is not exercising at home yet, but does want to add it.  His weight is back up some today after indulging in [place] over the weekend.  He has still not had a cigarrette, but is still craving them.  His blood sugars are doing good but he is sitll getting dizzy and nausea spells.  Lipids are good. Stress is still a contributing factor but he has a good support through church and wife.   Expected Outcomes Cainan will continue to exercise and come to class.  We will encourage better diet choices for overall improvement.  We will continue to monitor for progress.      ITP Comments:     ITP Comments    Row Name 08/25/15 1256 08/25/15 1305 08/25/15 1511 08/25/15 1515 08/25/15 1517   ITP Comments Doye said he has always been overweight. On Coumadin. Last HBA1c was 6.6 so recently started on Metformin. Stomach issues from abd flap to cover aortic valve replacement surgery.  Very stressed due to bills, $50 copay for Cardiac Rehab, Very stressed due to 4 surgery in 7 day Feb 28,th, Debrid March 31st, 3rd surgery for infection and more debridment May 29, 2015 and then 4th surgery for Omental Flap to put good tissue in open area in open heart surgery for Aortic Valve replacement April  4th, 2017. 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 Wife is a teacher-"I told her if she wanted to leave me know since I have had 4 surgeries. Father is Sonu Kruckenberg who was in Cardiac Rehab. Dalessandro said he has talked with his minister and Noah said he is a man of God and a Darrick Meigs but is stressed now.  Sathvik said he is not sure that he can go back to work what he was doing so will fax to [employer]. Torin is considereding applying for disability too.    [place] Name 09/14/15 0747 10/06/15 1133 10/12/15 0803       ITP Comments 30 day review. Continue with ITP Pt has been out since last review.  Left message on mobile. 30 day review. Continue with ITP unless changes noted by Medical Director at signature of review.  Comments:

## 2015-11-09 ENCOUNTER — Encounter: Payer: Self-pay | Admitting: *Deleted

## 2015-11-09 NOTE — Progress Notes (Signed)
Discharge Summary  Patient Details  Name: Russell Ray MRN: [id number] Date of Birth: 1970-11-18 Referring Provider:   Flowsheet Row Cardiac Rehab from 08/25/2015 in [facility] Cardiac [facility] Pul[facility] Rehab  Referring Provider  Neoma Laming       Number of Visits:   Reason for Discharge:  Early Exit:  Insurance  Smoking History:  History  Smoking Status  . Former Smoker  . Types: Cigarettes  Smokeless Tobacco  . Not on file    Diagnosis:  S/P aortic valve replacement  ADL UCSD:   Initial Exercise Prescription:     Initial Exercise Prescription - 08/25/15 1400      Date of Initial Exercise RX and Referring Provider   Date 08/25/15   Referring Provider Neoma Laming     Treadmill   MPH 2.5   Grade 0   Minutes 15   METs 2.9     Recumbant Bike   Level 3   RPM 50   Watts 75   Minutes 15     REL-XR   Level 3   Watts 75   Minutes 15   METs 2.8     T5 Nustep   Level 3   Watts 75   Minutes 15   METs 2.8     Prescription Details   Frequency (times per week) 3   Duration Progress to 30 minutes of continuous aerobic without signs/symptoms of physical distress     Intensity   THRR 40-80% of Max Heartrate 126-160   Ratings of Perceived Exertion 11-13   Perceived Dyspnea 0-4     Progression   Progression Continue to progress workloads to maintain intensity without signs/symptoms of physical distress.     Resistance Training   Training Prescription Yes   Weight 7  lifting restriction 30 lb   Reps 10-15      Discharge Exercise Prescription (Final Exercise Prescription Changes):     Exercise Prescription Changes - 09/21/15 1400      Exercise Review   Progression Yes     Response to Exercise   Blood Pressure (Admit) 160/90   Blood Pressure (Exercise) 158/82   Blood Pressure (Exit) 126/74   Heart Rate (Admit) 82 bpm   Heart Rate (Exercise) 126 bpm   Heart Rate (Exit) 82 bpm   Rating of Perceived Exertion (Exercise) 15   Symptoms none   Duration  Progress to 45 minutes of aerobic exercise without signs/symptoms of physical distress   Intensity THRR unchanged     Progression   Progression Continue to progress workloads to maintain intensity without signs/symptoms of physical distress.   Average METs 2.61     Resistance Training   Training Prescription Yes   Weight 7   Reps 10-15     Interval Training   Interval Training No     Treadmill   MPH 2.5   Grade 0   Minutes 15   METs 2.91     T5 Nustep   Level 3   Minutes 15   METs 2.3      Functional Capacity:     6 Minute Walk    Row Name 08/25/15 1424 10/12/15 1053       6 Minute Walk   Distance 1325 feet -  Discharge 6 min walk not done early exit.    Walk Time 6 minutes  -    MPH 2.5  -    METS 3.35  -    RPE 13  -    VO2  Peak 11.7  -    Symptoms No  -    Resting HR 93 bpm  -    Resting BP 142/68  -    Max Ex. HR 110 bpm  -    Max Ex. BP 198/104  -    2 Minute Post BP 150/80  -       Psychological, QOL, Others - Outcomes: PHQ 2/9: Depression screen PHQ 2/9 08/25/2015  Decreased Interest 2  Down, Depressed, Hopeless 1  PHQ - 2 Score 3  Altered sleeping 2  Tired, decreased energy 1  Change in appetite 1  Feeling bad or failure about yourself  1  Trouble concentrating 1  Moving slowly or fidgety/restless 1  Suicidal thoughts 0  PHQ-9 Score 10  Difficult doing work/chores Somewhat difficult    Quality of Life:     Quality of Life - 10/12/15 1053      Quality of Life Scores   Health/Function Post --  Not done early exit      Personal Goals: Goals established at orientation with interventions provided to work toward goal.     Personal Goals and Risk Factors at Admission - 08/25/15 1257      Core Components/Risk Factors/Patient Goals on Admission    Weight Management Yes;Obesity   Intervention Weight Management: Develop a combined nutrition and exercise program designed to reach desired caloric intake, while maintaining appropriate  intake of nutrient and fiber, sodium and fats, and appropriate energy expenditure required for the weight goal.;Weight Management: Provide education and appropriate resources to help participant work on and attain dietary goals.;Weight Management/Obesity: Establish reasonable short term and long term weight goals.;Obesity: Provide education and appropriate resources to help participant work on and attain dietary goals.   Admit Weight 392 lb (177.8 kg)   Expected Outcomes Short Term: Continue to assess and modify interventions until short term weight is achieved;Weight Maintenance: Understanding of the daily nutrition guidelines, which includes 25-35% calories from fat, 7% or less cal from saturated fats, less than 200mg  cholesterol, less than 1.5gm of sodium, & 5 or more servings of fruits and vegetables daily;Weight Loss: Understanding of general recommendations for a balanced deficit meal plan, which promotes 1-2 lb weight loss per week and includes a negative energy balance of [id number] kcal/d;Understanding recommendations for meals to include 15-35% energy as protein, 25-35% energy from fat, 35-60% energy from carbohydrates, less than 200mg  of dietary cholesterol, 20-35 gm of total fiber daily;Understanding of distribution of calorie intake throughout the day with the consumption of 4-5 meals/snacks;Weight Gain: Understanding of general recommendations for a high calorie, high protein meal plan that promotes weight gain by distributing calorie intake throughout the day with the consumption for 4-5 meals, snacks, and/or supplements   Sedentary Yes   Intervention Provide advice, education, support and counseling about physical activity/exercise needs.;Develop an individualized exercise prescription for aerobic and resistive training based on initial evaluation findings, risk stratification, comorbidities and participant's personal goals.   Expected Outcomes Achievement of increased cardiorespiratory fitness  and enhanced flexibility, muscular endurance and strength shown through measurements of functional capacity and personal statement of participant.   Stress Yes   Intervention Offer individual and/or small group education and counseling on adjustment to heart disease, stress management and health-related lifestyle change. Teach and support self-help strategies.;Refer participants experiencing significant psychosocial distress to appropriate mental health specialists for further evaluation and treatment. When possible, include family members and significant others in education/counseling sessions.   Expected Outcomes Short Term: Participant demonstrates changes  in health-related behavior, relaxation and other stress management skills, ability to obtain effective social support, and compliance with psychotropic medications if prescribed.;Long Term: Emotional wellbeing is indicated by absence of clinically significant psychosocial distress or social isolation.       Personal Goals Discharge:     Goals and Risk Factor Review    Row Name 09/12/15 1517 10/12/15 1054           Core Components/Risk Factors/Patient Goals Review   Personal Goals Review Weight Management/Obesity;Sedentary;Increase Strength and Stamina;Diabetes;Tobacco Cessation;Stress;Lipids;Hypertension;Heart Failure  -      Review Russell Ray is off to a good start with rehab.  He is only able to attend for a few weeks due to his copay.  He is not exercising at home yet, but does want to add it.  His weight is back up some today after indulging in [place] over the weekend.  He has still not had a cigarrette, but is still craving them.  His blood sugars are doing good but he is sitll getting dizzy and nausea spells.  Lipids are good. Stress is still a contributing factor but he has a good support through church and wife. Russell Ray called to say that he could not return to Cardiac Rehab since he could not afford the $50 copay each visit. Russell Ray said  it was very helpful to talk to the [place] Counselor. Russell Ray stated he is applying for disability.       Expected Outcomes Russell Ray will continue to exercise and come to class.  We will encourage better diet choices for overall improvement.  We will continue to monitor for progress. Cont with healthier lifestyle         Nutrition & Weight - Outcomes:     Pre Biometrics - 08/25/15 1420      Pre Biometrics   Height 5\' 11"  (1.803 m)   Weight (!)  392 lb 12.8 oz (178.2 kg)   Waist Circumference 63 inches   Hip Circumference 60 inches   Waist to Hip Ratio 1.05 %   BMI (Calculated) 54.9       Nutrition:     Nutrition Therapy & Goals - 08/25/15 1256      Nutrition Therapy   Drug/Food Interactions Coumadin/Vit K     Personal Nutrition Goals   Personal Goal #1 Diabetic diet, Coumadin     Intervention Plan   Intervention Prescribe, educate and counsel regarding individualized specific dietary modifications aiming towards targeted core components such as weight, hypertension, lipid management, diabetes, heart failure and other comorbidities.   Expected Outcomes Short Term Goal: Understand basic principles of dietary content, such as calories, fat, sodium, cholesterol and nutrients.;Long Term Goal: Adherence to prescribed nutrition plan.      Nutrition Discharge:     Nutrition Assessments - 10/12/15 1053      Rate Your Plate Scores   Post Score --  Not done early exit      Education Questionnaire Score:     Knowledge Questionnaire Score - 08/25/15 1513      Knowledge Questionnaire Score   Pre Score 25      Goals reviewed with patient; copy given to patient.

## 2015-11-09 NOTE — Progress Notes (Signed)
Discharge Summary  Patient Details  Name: Latravis Mabile MRN: [id number] Date of Birth: 1970/05/22 Referring Provider:   Flowsheet Row Cardiac Rehab from 08/25/2015 in [facility] Cardiac and Pulmonary Rehab  Referring Provider  Neoma Laming       Number of Visits:   Reason for Discharge: Early Exit Insurance  Smoking History:  History  Smoking Status  . Former Smoker  . Types: Cigarettes  Smokeless Tobacco  . Not on file    Diagnosis:  No diagnosis found.  ADL UCSD:   Initial Exercise Prescription:   Discharge Exercise Prescription (Final Exercise Prescription Changes):     Exercise Prescription Changes - 09/21/15 1400      Exercise Review   Progression Yes     Response to Exercise   Blood Pressure (Admit) 160/90   Blood Pressure (Exercise) 158/82   Blood Pressure (Exit) 126/74   Heart Rate (Admit) 82 bpm   Heart Rate (Exercise) 126 bpm   Heart Rate (Exit) 82 bpm   Rating of Perceived Exertion (Exercise) 15   Symptoms none   Duration Progress to 45 minutes of aerobic exercise without signs/symptoms of physical distress   Intensity THRR unchanged     Progression   Progression Continue to progress workloads to maintain intensity without signs/symptoms of physical distress.   Average METs 2.61     Resistance Training   Training Prescription Yes   Weight 7   Reps 10-15     Interval Training   Interval Training No     Treadmill   MPH 2.5   Grade 0   Minutes 15   METs 2.91     T5 Nustep   Level 3   Minutes 15   METs 2.3      Functional Capacity:     6 Minute Walk    Row Name 10/12/15 1053         6 Minute Walk   Distance -  Discharge 6 min walk not done early exit.        Psychological, QOL, Others - Outcomes: PHQ 2/9: Depression screen PHQ 2/9 08/25/2015  Decreased Interest 2  Down, Depressed, Hopeless 1  PHQ - 2 Score 3  Altered sleeping 2  Tired, decreased energy 1  Change in appetite 1  Feeling bad or failure about yourself  1   Trouble concentrating 1  Moving slowly or fidgety/restless 1  Suicidal thoughts 0  PHQ-9 Score 10  Difficult doing work/chores Somewhat difficult    Quality of Life:     Quality of Life - 10/12/15 1053      Quality of Life Scores   Health/Function Post --  Not done early exit      Personal Goals: Goals established at orientation with interventions provided to work toward goal.    Personal Goals Discharge:     Goals and Risk Factor Review    Row Name 09/12/15 1517 10/12/15 1054           Core Components/Risk Factors/Patient Goals Review   Personal Goals Review Weight Management/Obesity;Sedentary;Increase Strength and Stamina;Diabetes;Tobacco Cessation;Stress;Lipids;Hypertension;Heart Failure  -      Review Oaken is off to a good start with rehab.  He is only able to attend for a few weeks due to his copay.  He is not exercising at home yet, but does want to add it.  His weight is back up some today after indulging in [place] over the weekend.  He has still not had a cigarrette, but is still  craving them.  His blood sugars are doing good but he is sitll getting dizzy and nausea spells.  Lipids are good. Stress is still a contributing factor but he has a good support through church and wife. Algot Dennington called to say that he could not return to Cardiac Rehab since he could not afford the $50 copay each visit. Masanobu said it was very helpful to talk to the [place] Counselor. Philopateer stated he is applying for disability.       Expected Outcomes Rajat will continue to exercise and come to class.  We will encourage better diet choices for overall improvement.  We will continue to monitor for progress. Cont with healthier lifestyle         Nutrition & Weight - Outcomes:    Nutrition:   Nutrition Discharge:     Nutrition Assessments - 10/12/15 1053      Rate Your Plate Scores   Post Score --  Not done early exit      Education Questionnaire  Score:   Goals reviewed with patient; copy given to patient.

## 2016-10-04 ENCOUNTER — Encounter: Payer: Self-pay | Admitting: *Deleted

## 2016-10-05 ENCOUNTER — Encounter
Admission: RE | Disposition: A | Discharge status: Home / Self Care | Payer: Self-pay | Source: Ambulatory Visit | Attending: Unknown Physician Specialty

## 2016-10-05 ENCOUNTER — Ambulatory Visit: Payer: BLUE CROSS/BLUE SHIELD | Admitting: Anesthesiology

## 2016-10-05 ENCOUNTER — Ambulatory Visit
Admission: RE | Admit: 2016-10-05 | Discharge: 2016-10-05 | Disposition: A | Discharge status: Home / Self Care | Payer: BLUE CROSS/BLUE SHIELD | Source: Ambulatory Visit | Attending: Unknown Physician Specialty | Admitting: Unknown Physician Specialty

## 2016-10-05 DIAGNOSIS — Z6841 Body Mass Index (BMI) 40.0 and over, adult: Secondary | ICD-10-CM | POA: Diagnosis not present

## 2016-10-05 DIAGNOSIS — F329 Major depressive disorder, single episode, unspecified: Secondary | ICD-10-CM | POA: Insufficient documentation

## 2016-10-05 DIAGNOSIS — Z7901 Long term (current) use of anticoagulants: Secondary | ICD-10-CM | POA: Diagnosis not present

## 2016-10-05 DIAGNOSIS — K210 Gastro-esophageal reflux disease with esophagitis: Secondary | ICD-10-CM | POA: Diagnosis not present

## 2016-10-05 DIAGNOSIS — D127 Benign neoplasm of rectosigmoid junction: Secondary | ICD-10-CM | POA: Diagnosis not present

## 2016-10-05 DIAGNOSIS — Z952 Presence of prosthetic heart valve: Secondary | ICD-10-CM | POA: Insufficient documentation

## 2016-10-05 DIAGNOSIS — K640 First degree hemorrhoids: Secondary | ICD-10-CM | POA: Diagnosis not present

## 2016-10-05 DIAGNOSIS — Z87891 Personal history of nicotine dependence: Secondary | ICD-10-CM | POA: Diagnosis not present

## 2016-10-05 DIAGNOSIS — E119 Type 2 diabetes mellitus without complications: Secondary | ICD-10-CM | POA: Diagnosis not present

## 2016-10-05 DIAGNOSIS — I110 Hypertensive heart disease with heart failure: Secondary | ICD-10-CM | POA: Insufficient documentation

## 2016-10-05 DIAGNOSIS — Z7982 Long term (current) use of aspirin: Secondary | ICD-10-CM | POA: Diagnosis not present

## 2016-10-05 DIAGNOSIS — Z7984 Long term (current) use of oral hypoglycemic drugs: Secondary | ICD-10-CM | POA: Insufficient documentation

## 2016-10-05 DIAGNOSIS — I4891 Unspecified atrial fibrillation: Secondary | ICD-10-CM | POA: Insufficient documentation

## 2016-10-05 DIAGNOSIS — I509 Heart failure, unspecified: Secondary | ICD-10-CM | POA: Insufficient documentation

## 2016-10-05 DIAGNOSIS — K625 Hemorrhage of anus and rectum: Secondary | ICD-10-CM | POA: Diagnosis present

## 2016-10-05 DIAGNOSIS — D123 Benign neoplasm of transverse colon: Secondary | ICD-10-CM | POA: Diagnosis not present

## 2016-10-05 HISTORY — DX: Heart failure, unspecified: I50.9

## 2016-10-05 HISTORY — PX: COLONOSCOPY WITH PROPOFOL: SHX5780

## 2016-10-05 HISTORY — DX: Major depressive disorder, single episode, unspecified: F32.9

## 2016-10-05 HISTORY — PX: ESOPHAGOGASTRODUODENOSCOPY (EGD) WITH PROPOFOL: SHX5813

## 2016-10-05 HISTORY — DX: Nonrheumatic aortic (valve) stenosis: I35.0

## 2016-10-05 HISTORY — DX: Depression, unspecified: F32.A

## 2016-10-05 LAB — GLUCOSE, CAPILLARY: Glucose-Capillary: 179 mg/dL — ABNORMAL HIGH (ref 65–99)

## 2016-10-05 SURGERY — COLONOSCOPY WITH PROPOFOL
Anesthesia: General

## 2016-10-05 MED ORDER — GLYCOPYRROLATE 0.2 MG/ML IJ SOLN
INTRAMUSCULAR | Status: DC | PRN
  Administered 2016-10-05: 0.1 mg via INTRAVENOUS

## 2016-10-05 MED ORDER — PROPOFOL 500 MG/50ML IV EMUL
INTRAVENOUS | Status: AC
  Filled 2016-10-05: qty 50

## 2016-10-05 MED ORDER — PROPOFOL 10 MG/ML IV BOLUS
INTRAVENOUS | Status: DC | PRN
  Administered 2016-10-05: 50 mg via INTRAVENOUS

## 2016-10-05 MED ORDER — FENTANYL CITRATE (PF) 100 MCG/2ML IJ SOLN
INTRAMUSCULAR | Status: DC | PRN
  Administered 2016-10-05 (×2): 50 ug via INTRAVENOUS

## 2016-10-05 MED ORDER — LIDOCAINE HCL (PF) 2 % IJ SOLN
INTRAMUSCULAR | Status: AC
  Filled 2016-10-05: qty 2

## 2016-10-05 MED ORDER — MIDAZOLAM HCL 2 MG/2ML IJ SOLN
INTRAMUSCULAR | Status: AC
  Filled 2016-10-05: qty 2

## 2016-10-05 MED ORDER — METOPROLOL TARTRATE 5 MG/5ML IV SOLN
INTRAVENOUS | Status: AC
  Filled 2016-10-05: qty 5

## 2016-10-05 MED ORDER — SODIUM CHLORIDE 0.9 % IV SOLN: INTRAVENOUS | Status: DC

## 2016-10-05 MED ORDER — SODIUM CHLORIDE 0.9 % IV SOLN
INTRAVENOUS | Status: DC
  Administered 2016-10-05: 09:00:00 via INTRAVENOUS

## 2016-10-05 MED ORDER — PIPERACILLIN-TAZOBACTAM 3.375 G IVPB 30 MIN
3.3750 g | Freq: Once | INTRAVENOUS | Status: AC
  Administered 2016-10-05: 3.375 g via INTRAVENOUS

## 2016-10-05 MED ORDER — MIDAZOLAM HCL 5 MG/5ML IJ SOLN
INTRAMUSCULAR | Status: DC | PRN
  Administered 2016-10-05: 2 mg via INTRAVENOUS
  Administered 2016-10-05: 1 mg via INTRAVENOUS
  Administered 2016-10-05: 2 mg via INTRAVENOUS
  Administered 2016-10-05: 1 mg via INTRAVENOUS

## 2016-10-05 MED ORDER — PHENYLEPHRINE HCL 10 MG/ML IJ SOLN
INTRAMUSCULAR | Status: DC | PRN
  Administered 2016-10-05: 200 ug via INTRAVENOUS
  Administered 2016-10-05: 100 ug via INTRAVENOUS
  Administered 2016-10-05 (×2): 200 ug via INTRAVENOUS
  Administered 2016-10-05 (×3): 100 ug via INTRAVENOUS

## 2016-10-05 MED ORDER — PROPOFOL 500 MG/50ML IV EMUL
INTRAVENOUS | Status: DC | PRN
  Administered 2016-10-05: 75 ug/kg/min via INTRAVENOUS

## 2016-10-05 MED ORDER — BUTAMBEN-TETRACAINE-BENZOCAINE 2-2-14 % EX AERO
INHALATION_SPRAY | CUTANEOUS | Status: DC | PRN
  Administered 2016-10-05: 2 via TOPICAL

## 2016-10-05 MED ORDER — PIPERACILLIN-TAZOBACTAM 3.375 G IVPB
INTRAVENOUS | Status: AC
  Filled 2016-10-05: qty 50

## 2016-10-05 MED ORDER — FENTANYL CITRATE (PF) 100 MCG/2ML IJ SOLN
INTRAMUSCULAR | Status: AC
  Filled 2016-10-05: qty 2

## 2016-10-05 MED ORDER — GLYCOPYRROLATE 0.2 MG/ML IJ SOLN
INTRAMUSCULAR | Status: AC
  Filled 2016-10-05: qty 1

## 2016-10-05 MED ORDER — BUTAMBEN-TETRACAINE-BENZOCAINE 2-2-14 % EX AERO
INHALATION_SPRAY | CUTANEOUS | Status: AC
  Filled 2016-10-05: qty 20

## 2016-10-05 MED ORDER — LIDOCAINE HCL (PF) 2 % IJ SOLN
INTRAMUSCULAR | Status: DC | PRN
  Administered 2016-10-05: 100 mg

## 2016-10-05 NOTE — Op Note (Signed)
[facility] Gastroenterology Patient Name: Russell Ray Procedure Date: 10/05/2016 9:04 AM MRN: [id number] Account #: [id number] Date of Birth: 04-Jul-1970 Admit Type: Outpatient Age: 46 Room: [facility] ENDO ROOM 1 Gender: Male Note Status: Finalized Procedure:            Colonoscopy Indications:          Rectal bleeding Providers:            Manya Silvas, MD Referring MD:         Venetia Maxon. Elijio Miles, MD (Referring MD) Medicines:            Propofol per Anesthesia Complications:        No immediate complications. Procedure:            Pre-Anesthesia Assessment:                       - After reviewing the risks and benefits, the patient                        was deemed in satisfactory condition to undergo the                        procedure.                       After obtaining informed consent, the colonoscope was                        passed under direct vision. Throughout the procedure,                        the patient's blood pressure, pulse, and oxygen                        saturations were monitored continuously. The                        Colonoscope was introduced through the anus and                        advanced to the the cecum, identified by appendiceal                        orifice and ileocecal valve. The colonoscopy was                        performed without difficulty. The patient tolerated the                        procedure well. The quality of the bowel preparation                        was excellent. Findings:      Three sessile polyps were found in the recto-sigmoid colon, transverse       colon and hepatic flexure. The polyps were small in size. These polyps       were removed with a hot snare. Resection and retrieval were complete. To       prevent bleeding after the polypectomy, three hemostatic clips were       successfully placed. One on each of the polypectomy sites There was no  bleeding at the end of the procedure.    Internal hemorrhoids were found during endoscopy. The hemorrhoids were       small, medium-sized and Grade I (internal hemorrhoids that do not       prolapse).      The exam was otherwise without abnormality. Impression:           - Three small polyps at the recto-sigmoid colon, in the                        transverse colon and at the hepatic flexure, removed                        with a hot snare. Resected and retrieved. Clips were                        placed.                       - Internal hemorrhoids.                       - The examination was otherwise normal. Recommendation:       - Await pathology results. Start Lovenox today 4pm,                        start coumadin also today at noon. Manya Silvas, MD 10/05/2016 9:42:50 AM This report has been signed electronically. Number of Addenda: 0 Note Initiated On: 10/05/2016 9:04 AM Scope Withdrawal Time: 0 hours 10 minutes 46 seconds  Total Procedure Duration: 0 hours 16 minutes 58 seconds       [facility]

## 2016-10-05 NOTE — Transfer of Care (Signed)
Immediate Anesthesia Transfer of Care Note  Patient: Russell Ray  Procedure(s) Performed: Procedure(s): COLONOSCOPY WITH PROPOFOL (N/A) ESOPHAGOGASTRODUODENOSCOPY (EGD) WITH PROPOFOL (N/A)  Patient Location: PACU  Anesthesia Type:General  Level of Consciousness: sedated  Airway & Oxygen Therapy: Patient Spontanous Breathing and Patient connected to nasal cannula oxygen  Post-op Assessment: Report given to RN and Post -op Vital signs reviewed and stable  Post vital signs: Reviewed and stable  Last Vitals:  Vitals:   10/05/16 0810  BP: 132/60  Pulse: (!) 103  Resp: 20  Temp: 37 C  SpO2: 99%    Last Pain:  Vitals:   10/05/16 0810  TempSrc: Tympanic         Complications: No apparent anesthesia complications

## 2016-10-05 NOTE — Anesthesia Preprocedure Evaluation (Signed)
Anesthesia Evaluation  Patient identified by MRN, date of birth, ID band Patient awake    Reviewed: Allergy & Precautions, NPO status , Patient's Chart, lab work & pertinent test results, reviewed documented beta blocker date and time   Airway Mallampati: III  TM Distance: >3 FB     Dental  (+) Chipped   Pulmonary shortness of breath and with exertion, sleep apnea and Continuous Positive Airway Pressure Ventilation , former smoker,    Pulmonary exam normal        Cardiovascular hypertension, Pt. on medications +CHF and + Orthopnea  + dysrhythmias Atrial Fibrillation  Rhythm:Irregular     Neuro/Psych Anxiety negative neurological ROS     GI/Hepatic negative GI ROS, Neg liver ROS,   Endo/Other  diabetes, Well Controlled, Type 2Morbid obesity  Renal/GU negative Renal ROS  negative genitourinary   Musculoskeletal negative musculoskeletal ROS (+)   Abdominal (+) + obese,   Peds negative pediatric ROS (+)  Hematology  (+) anemia ,   Anesthesia Other Findings A fib. Valve replaacement. Smoke quit in [place].  Reproductive/Obstetrics                             Anesthesia Physical  Anesthesia Plan  ASA: III and emergent  Anesthesia Plan: General   Post-op Pain Management:    Induction: Intravenous  PONV Risk Score and Plan:   Airway Management Planned: Nasal Cannula  Additional Equipment:   Intra-op Plan:   Post-operative Plan:   Informed Consent: I have reviewed the patients History and Physical, chart, labs and discussed the procedure including the risks, benefits and alternatives for the proposed anesthesia with the patient or authorized representative who has indicated his/her understanding and acceptance.   Dental advisory given  Plan Discussed with: CRNA and Surgeon  Anesthesia Plan Comments:         Anesthesia Quick Evaluation

## 2016-10-05 NOTE — Anesthesia Postprocedure Evaluation (Signed)
Anesthesia Post Note  Patient: Russell Ray  Procedure(s) Performed: Procedure(s) (LRB): COLONOSCOPY WITH PROPOFOL (N/A) ESOPHAGOGASTRODUODENOSCOPY (EGD) WITH PROPOFOL (N/A)  Patient location during evaluation: PACU Anesthesia Type: General Level of consciousness: awake and alert and oriented Pain management: pain level controlled Vital Signs Assessment: post-procedure vital signs reviewed and stable Respiratory status: spontaneous breathing Cardiovascular status: blood pressure returned to baseline Anesthetic complications: no     Last Vitals:  Vitals:   10/05/16 1022 10/05/16 1026  BP: (!) 109/50 (!) 105/56  Pulse:    Resp:    Temp:    SpO2:      Last Pain:  Vitals:   10/05/16 0942  TempSrc: Tympanic                 Xoe Hoe

## 2016-10-05 NOTE — Op Note (Signed)
[facility] Gastroenterology Patient Name: Russell Ray Procedure Date: 10/05/2016 9:04 AM MRN: [id number] Account #: [id number] Date of Birth: 1970/03/26 Admit Type: Outpatient Age: 46 Room: [facility] ENDO ROOM 1 Gender: Male Note Status: Finalized Procedure:            Upper GI endoscopy Indications:          Heartburn, Suspected gastro-esophageal reflux disease Providers:            Manya Silvas, MD Referring MD:         Venetia Maxon. Elijio Miles, MD (Referring MD) Medicines:            Propofol per Anesthesia, Cetacaine spray Complications:        No immediate complications. Procedure:            Pre-Anesthesia Assessment:                       - After reviewing the risks and benefits, the patient                        was deemed in satisfactory condition to undergo the                        procedure.                       After obtaining informed consent, the endoscope was                        passed under direct vision. Throughout the procedure,                        the patient's blood pressure, pulse, and oxygen                        saturations were monitored continuously. The Endoscope                        was introduced through the mouth, and advanced to the                        second part of duodenum. The upper GI endoscopy was                        accomplished without difficulty. The patient tolerated                        the procedure well. Findings:      LA Grade A (one or more mucosal breaks less than 5 mm, not extending       between tops of 2 mucosal folds) esophagitis with no bleeding was found       45 cm from the incisors. This was the gastroesophageal junction.      The stomach was normal. Minimal erythema diffusely in the body of the       stomach.      The examined duodenum was normal. Impression:           - LA Grade A reflux esophagitis.                       - Normal stomach.                       -  Normal examined duodenum.                       - No specimens collected. Recommendation:       - The findings and recommendations were discussed with                        the patient's family. Manya Silvas, MD 10/05/2016 9:15:10 AM This report has been signed electronically. Number of Addenda: 0 Note Initiated On: 10/05/2016 9:04 AM      [facility]

## 2016-10-05 NOTE — H&P (Signed)
Primary Care Physician:  [name], MD Primary Gastroenterologist:  Dr. Vira Agar  Pre-Procedure History & Physical: HPI:  Russell Ray is a 46 y.o. male is here for an endoscopy and colonoscopy.   Past Medical History:  Diagnosis Date  . Anxiety   . Aortic valve stenosis   . Atrial fibrillation ([place])   . CHF (congestive heart failure) ([place])   . Depression   . Diabetes mellitus without complication ([place])   . Hypertension     Past Surgical History:  Procedure Laterality Date  . APPLICATION OF WOUND VAC    . CARDIAC CATHETERIZATION Right 04/22/2015   Procedure: Right/Left Heart Cath and Coronary Angiography;  Surgeon: Dionisio David, MD;  Location: [facility];  Service: Cardiovascular;  Laterality: Right;  . CARDIAC VALVE REPLACEMENT    . debridement sternum    . ELECTROPHYSIOLOGIC STUDY N/A 05/05/2015   Procedure: Cardioversion;  Surgeon: Dionisio David, MD;  Location: [facility] ORS;  Service: Cardiovascular;  Laterality: N/A;  . ELECTROPHYSIOLOGIC STUDY N/A 05/17/2015   Procedure: CARDIOVERSION;  Surgeon: Dionisio David, MD;  Location: [facility] ORS;  Service: Cardiovascular;  Laterality: N/A;  . GREATER OMENTAL FLAP CLOSURE    . HERNIA REPAIR     X3  . VALVE REPLACEMENT      Prior to Admission medications   Medication Sig Start Date End Date Taking? Authorizing Provider  aspirin EC 81 MG tablet Take by mouth.   Yes [provider]  chlorthalidone (HYGROTON) 25 MG tablet Take 25 mg by mouth daily.   Yes [provider]  cyanocobalamin 1000 MCG tablet Take 1,000 mcg by mouth daily.   Yes [provider]  escitalopram (LEXAPRO) 5 MG tablet Take 5 mg by mouth daily.   Yes [provider]  LORazepam (ATIVAN) 1 MG tablet Take 1 mg by mouth daily.  03/11/15  Yes [provider]  metoprolol succinate (TOPROL-XL) 25 MG 24 hr tablet Take 25 mg by mouth daily.   Yes [provider]  pioglitazone (ACTOS) 45 MG tablet Take 45 mg  by mouth daily.   Yes [provider]  warfarin (COUMADIN) 2 MG tablet Take 3 tablets (6 mg total) by mouth at bedtime. Patient taking differently: Take 4 mg by mouth at bedtime. Patient taking 4 mg T/TH/Sat/Sun 5 mg MWF 05/05/15  Yes Theodoro Grist, MD  acetaminophen (TYLENOL) 325 MG tablet Take 650 mg by mouth every 4 (four) hours as needed for moderate pain.    [provider]  amiodarone (PACERONE) 400 MG tablet Take 1 tablet (400 mg total) by mouth 2 (two) times daily. Patient not taking: Reported on 10/05/2016 05/06/15   Theodoro Grist, MD  cyclobenzaprine (FLEXERIL) 5 MG tablet Take 5 mg by mouth 3 (three) times daily as needed.    [provider]  furosemide (LASIX) 40 MG tablet Take 1 tablet (40 mg total) by mouth daily. Patient not taking: Reported on 10/05/2016 03/19/15   Theodoro Grist, MD  KLOR-CON M20 20 MEQ tablet Take 1 tablet by mouth 2 (two) times daily. 05/02/15   [provider]  metFORMIN (GLUCOPHAGE) 500 MG tablet Take 500 mg by mouth 2 (two) times daily. 03/08/15   [provider]  metoprolol (LOPRESSOR) 50 MG tablet Take 1 tablet by mouth 2 (two) times daily. 05/02/15   [provider]  nicotine (NICODERM CQ - DOSED IN MG/24 HOURS) 14 mg/24hr patch Place 14 mg onto the skin daily.    [provider]  oxyCODONE (OXY IR/ROXICODONE) 5 MG immediate release tablet Take 1 tablet by mouth every 4 (four) hours as needed. 05/02/15   [provider]  polyethylene glycol (MIRALAX / GLYCOLAX) packet Take 17 g by mouth daily as needed.    [provider]  senna-docusate (SENOKOT-S) 8.6-50 MG tablet Take 1 tablet by mouth at bedtime as needed for mild constipation. Patient not taking: Reported on 10/05/2016 03/19/15   Theodoro Grist, MD    Allergies as of 09/14/2016  . (No Known Allergies)    Family History  Problem Relation Age of Onset  . Hypertension Mother   . Hypertension Maternal Grandmother   . Diabetes  Maternal Grandmother     Social History   Social History  . Marital status: Married    Spouse name: N/A  . Number of children: N/A  . Years of education: N/A   Occupational History  . Not on file.   Social History Main Topics  . Smoking status: Former Smoker    Packs/day: 2.00    Years: 28.00    Types: Cigarettes  . Smokeless tobacco: Never Used  . Alcohol use No  . Drug use: No  . Sexual activity: Not on file   Other Topics Concern  . Not on file   Social History Narrative  . No narrative on file    Review of Systems: See HPI, otherwise negative ROS  Physical Exam: BP 132/60   Pulse (!) 103   Temp 98.6 F (37 C) (Tympanic)   Resp 20   Ht 6' (1.829 m)   Wt (!) 186.4 kg (411 lb)   SpO2 99%   BMI 55.74 kg/m  General:   Alert,  pleasant and cooperative in NAD Head:  Normocephalic and atraumatic. Neck:  Supple; no masses or thyromegaly. Lungs:  Clear throughout to auscultation.    Heart:  Regular rate and rhythm. Abdomen:  Soft, nontender and nondistended. Normal bowel sounds, without guarding, and without rebound.   Neurologic:  Alert and  oriented x4;  grossly normal neurologically.  Impression/Plan: Russell Ray is here for an endoscopy and colonoscopy to be performed for heartburn and rectal bleeding.  Risks, benefits, limitations, and alternatives regarding  endoscopy and colonoscopy have been reviewed with the patient.  Questions have been answered.  All parties agreeable.   Gaylyn Cheers, MD  10/05/2016, 9:00 AM

## 2016-10-05 NOTE — Anesthesia Post-op Follow-up Note (Signed)
Anesthesia QCDR form completed.        

## 2016-10-08 ENCOUNTER — Encounter: Payer: Self-pay | Admitting: Unknown Physician Specialty

## 2016-10-08 LAB — SURGICAL PATHOLOGY

## 2016-12-01 ENCOUNTER — Emergency Department: Payer: BLUE CROSS/BLUE SHIELD | Admitting: Anesthesiology

## 2016-12-01 ENCOUNTER — Emergency Department: Payer: BLUE CROSS/BLUE SHIELD

## 2016-12-01 ENCOUNTER — Encounter
Admission: EM | Disposition: A | Discharge status: Home / Self Care | Payer: Self-pay | Source: Home / Self Care | Attending: Surgery

## 2016-12-01 ENCOUNTER — Inpatient Hospital Stay
Admission: EM | Admit: 2016-12-01 | Discharge: 2016-12-03 | DRG: 354 | Disposition: A | Discharge status: Home / Self Care | Payer: BLUE CROSS/BLUE SHIELD | Attending: Surgery | Admitting: Surgery

## 2016-12-01 ENCOUNTER — Encounter: Payer: Self-pay | Admitting: Radiology

## 2016-12-01 DIAGNOSIS — E119 Type 2 diabetes mellitus without complications: Secondary | ICD-10-CM | POA: Diagnosis present

## 2016-12-01 DIAGNOSIS — Z7982 Long term (current) use of aspirin: Secondary | ICD-10-CM

## 2016-12-01 DIAGNOSIS — Z79899 Other long term (current) drug therapy: Secondary | ICD-10-CM

## 2016-12-01 DIAGNOSIS — F419 Anxiety disorder, unspecified: Secondary | ICD-10-CM | POA: Diagnosis present

## 2016-12-01 DIAGNOSIS — Z87891 Personal history of nicotine dependence: Secondary | ICD-10-CM | POA: Diagnosis not present

## 2016-12-01 DIAGNOSIS — Z7984 Long term (current) use of oral hypoglycemic drugs: Secondary | ICD-10-CM

## 2016-12-01 DIAGNOSIS — F329 Major depressive disorder, single episode, unspecified: Secondary | ICD-10-CM | POA: Diagnosis present

## 2016-12-01 DIAGNOSIS — G4733 Obstructive sleep apnea (adult) (pediatric): Secondary | ICD-10-CM | POA: Diagnosis present

## 2016-12-01 DIAGNOSIS — Z7901 Long term (current) use of anticoagulants: Secondary | ICD-10-CM | POA: Diagnosis not present

## 2016-12-01 DIAGNOSIS — K436 Other and unspecified ventral hernia with obstruction, without gangrene: Secondary | ICD-10-CM | POA: Diagnosis not present

## 2016-12-01 DIAGNOSIS — K430 Incisional hernia with obstruction, without gangrene: Secondary | ICD-10-CM | POA: Diagnosis present

## 2016-12-01 DIAGNOSIS — Z6841 Body Mass Index (BMI) 40.0 and over, adult: Secondary | ICD-10-CM | POA: Diagnosis not present

## 2016-12-01 DIAGNOSIS — Z952 Presence of prosthetic heart valve: Secondary | ICD-10-CM

## 2016-12-01 DIAGNOSIS — I4891 Unspecified atrial fibrillation: Secondary | ICD-10-CM | POA: Diagnosis present

## 2016-12-01 DIAGNOSIS — I110 Hypertensive heart disease with heart failure: Secondary | ICD-10-CM | POA: Diagnosis present

## 2016-12-01 DIAGNOSIS — D649 Anemia, unspecified: Secondary | ICD-10-CM | POA: Diagnosis present

## 2016-12-01 DIAGNOSIS — K460 Unspecified abdominal hernia with obstruction, without gangrene: Secondary | ICD-10-CM

## 2016-12-01 DIAGNOSIS — I509 Heart failure, unspecified: Secondary | ICD-10-CM | POA: Diagnosis present

## 2016-12-01 HISTORY — PX: VENTRAL HERNIA REPAIR: SHX424

## 2016-12-01 LAB — COMPREHENSIVE METABOLIC PANEL
ALBUMIN: 4.2 g/dL (ref 3.5–5.0)
ALK PHOS: 52 U/L (ref 38–126)
ALT: 17 U/L (ref 17–63)
AST: 22 U/L (ref 15–41)
Anion gap: 12 (ref 5–15)
BUN: 19 mg/dL (ref 6–20)
CALCIUM: 9.6 mg/dL (ref 8.9–10.3)
CHLORIDE: 97 mmol/L — AB (ref 101–111)
CO2: 25 mmol/L (ref 22–32)
CREATININE: 0.79 mg/dL (ref 0.61–1.24)
GFR calc non Af Amer: >60 mL/min (ref >60)
GLUCOSE: 191 mg/dL — AB (ref 65–99)
Potassium: 3.4 mmol/L — ABNORMAL LOW (ref 3.5–5.1)
SODIUM: 134 mmol/L — AB (ref 135–145)
Total Bilirubin: 0.5 mg/dL (ref 0.3–1.2)
Total Protein: 7.5 g/dL (ref 6.5–8.1)

## 2016-12-01 LAB — CBC WITH DIFFERENTIAL/PLATELET
BASOS PCT: 1 %
Basophils Absolute: 0.1 10*3/uL (ref 0–0.1)
EOS ABS: 0.1 10*3/uL (ref 0–0.7)
Eosinophils Relative: 1 %
HEMATOCRIT: 34.7 % — AB (ref 40.0–52.0)
HEMOGLOBIN: 11.8 g/dL — AB (ref 13.0–18.0)
LYMPHS ABS: 1.5 10*3/uL (ref 1.0–3.6)
Lymphocytes Relative: 13 %
MCH: 27.4 pg (ref 26.0–34.0)
MCHC: 34 g/dL (ref 32.0–36.0)
MCV: 80.4 fL (ref 80.0–100.0)
MONO ABS: 0.4 10*3/uL (ref 0.2–1.0)
MONOS PCT: 3 %
NEUTROS PCT: 82 %
Neutro Abs: 9.1 10*3/uL — ABNORMAL HIGH (ref 1.4–6.5)
Platelets: 305 10*3/uL (ref 150–440)
RBC: 4.31 MIL/uL — ABNORMAL LOW (ref 4.40–5.90)
RDW: 15.1 % — AB (ref 11.5–14.5)
WBC: 11 10*3/uL — ABNORMAL HIGH (ref 3.8–10.6)

## 2016-12-01 LAB — LACTIC ACID, PLASMA
LACTIC ACID, VENOUS: 2 mmol/L — AB (ref 0.5–1.9)
Lactic Acid, Venous: 2.3 mmol/L (ref 0.5–1.9)

## 2016-12-01 LAB — PROTIME-INR
INR: 2.07
PROTHROMBIN TIME: 23.1 s — AB (ref 11.4–15.2)

## 2016-12-01 LAB — ABO/RH: ABO/RH(D): A POS

## 2016-12-01 SURGERY — REPAIR, HERNIA, VENTRAL
Anesthesia: General | Site: Abdomen | Wound class: Clean

## 2016-12-01 MED ORDER — SODIUM CHLORIDE 0.9 % IV BOLUS (SEPSIS)
1000.0000 mL | Freq: Once | INTRAVENOUS | Status: AC
  Administered 2016-12-01: 1000 mL via INTRAVENOUS

## 2016-12-01 MED ORDER — BUPIVACAINE-EPINEPHRINE (PF) 0.25% -1:200000 IJ SOLN
INTRAMUSCULAR | Status: DC | PRN
  Administered 2016-12-01: 30 mL

## 2016-12-01 MED ORDER — ONDANSETRON HCL 4 MG/2ML IJ SOLN
INTRAMUSCULAR | Status: DC | PRN
  Administered 2016-12-01: 4 mg via INTRAVENOUS

## 2016-12-01 MED ORDER — EPHEDRINE SULFATE 50 MG/ML IJ SOLN
INTRAMUSCULAR | Status: AC
  Filled 2016-12-01: qty 1

## 2016-12-01 MED ORDER — DEXAMETHASONE SODIUM PHOSPHATE 10 MG/ML IJ SOLN
INTRAMUSCULAR | Status: DC | PRN
  Administered 2016-12-01: 10 mg via INTRAVENOUS

## 2016-12-01 MED ORDER — MIDAZOLAM HCL 2 MG/2ML IJ SOLN
INTRAMUSCULAR | Status: AC
  Filled 2016-12-01: qty 2

## 2016-12-01 MED ORDER — BUPIVACAINE-EPINEPHRINE (PF) 0.25% -1:200000 IJ SOLN
INTRAMUSCULAR | Status: AC
  Filled 2016-12-01: qty 30

## 2016-12-01 MED ORDER — HYDROMORPHONE HCL 1 MG/ML IJ SOLN
2.0000 mg | Freq: Once | INTRAMUSCULAR | Status: AC
  Administered 2016-12-01: 2 mg via INTRAVENOUS
  Filled 2016-12-01: qty 2

## 2016-12-01 MED ORDER — FENTANYL CITRATE (PF) 100 MCG/2ML IJ SOLN
25.0000 ug | INTRAMUSCULAR | Status: DC | PRN
  Filled 2016-12-01: qty 2

## 2016-12-01 MED ORDER — VANCOMYCIN HCL IN DEXTROSE 1-5 GM/200ML-% IV SOLN
1000.0000 mg | Freq: Once | INTRAVENOUS | Status: AC
  Administered 2016-12-01: 1000 mg via INTRAVENOUS
  Filled 2016-12-01: qty 200

## 2016-12-01 MED ORDER — LACTATED RINGERS IV SOLN
INTRAVENOUS | Status: DC | PRN
  Administered 2016-12-01: 22:00:00 via INTRAVENOUS

## 2016-12-01 MED ORDER — BUPIVACAINE LIPOSOME 1.3 % IJ SUSP
INTRAMUSCULAR | Status: AC
  Filled 2016-12-01: qty 20

## 2016-12-01 MED ORDER — PROPOFOL 10 MG/ML IV BOLUS
INTRAVENOUS | Status: DC | PRN
  Administered 2016-12-01: 200 mg via INTRAVENOUS

## 2016-12-01 MED ORDER — SODIUM CHLORIDE 0.9 % IV SOLN
INTRAVENOUS | Status: DC | PRN
  Administered 2016-12-01: 70 mL

## 2016-12-01 MED ORDER — FENTANYL CITRATE (PF) 250 MCG/5ML IJ SOLN
INTRAMUSCULAR | Status: AC
  Filled 2016-12-01: qty 5

## 2016-12-01 MED ORDER — SODIUM CHLORIDE 0.9 % IV SOLN
Freq: Once | INTRAVENOUS | Status: AC
  Administered 2016-12-01: 22:00:00 via INTRAVENOUS

## 2016-12-01 MED ORDER — PHENYLEPHRINE HCL 10 MG/ML IJ SOLN
INTRAMUSCULAR | Status: AC
  Filled 2016-12-01: qty 1

## 2016-12-01 MED ORDER — PROPOFOL 10 MG/ML IV BOLUS
INTRAVENOUS | Status: AC
  Filled 2016-12-01: qty 20

## 2016-12-01 MED ORDER — ROCURONIUM BROMIDE 100 MG/10ML IV SOLN
INTRAVENOUS | Status: DC | PRN
Start: 2016-12-01 — End: 2016-12-02
  Administered 2016-12-01: 50 mg via INTRAVENOUS

## 2016-12-01 MED ORDER — ONDANSETRON HCL 4 MG/2ML IJ SOLN
4.0000 mg | Freq: Once | INTRAMUSCULAR | Status: AC
  Administered 2016-12-01: 4 mg via INTRAVENOUS
  Filled 2016-12-01: qty 2

## 2016-12-01 MED ORDER — SUCCINYLCHOLINE CHLORIDE 20 MG/ML IJ SOLN
INTRAMUSCULAR | Status: AC
  Filled 2016-12-01: qty 1

## 2016-12-01 MED ORDER — PIPERACILLIN-TAZOBACTAM 3.375 G IVPB 30 MIN
INTRAVENOUS | Status: AC
  Filled 2016-12-01: qty 50

## 2016-12-01 MED ORDER — ONDANSETRON HCL 4 MG/2ML IJ SOLN
4.0000 mg | Freq: Once | INTRAMUSCULAR | Status: DC | PRN
  Filled 2016-12-01: qty 2

## 2016-12-01 MED ORDER — SODIUM CHLORIDE 0.9 % IJ SOLN
INTRAMUSCULAR | Status: AC
  Filled 2016-12-01: qty 50

## 2016-12-01 MED ORDER — PIPERACILLIN-TAZOBACTAM 3.375 G IVPB 30 MIN
3.3750 g | Freq: Once | INTRAVENOUS | Status: AC
  Administered 2016-12-01: 3.375 g via INTRAVENOUS

## 2016-12-01 MED ORDER — VECURONIUM BROMIDE 10 MG IV SOLR
INTRAVENOUS | Status: DC | PRN
  Administered 2016-12-01: 5 mg via INTRAVENOUS
  Administered 2016-12-01: 2 mg via INTRAVENOUS

## 2016-12-01 MED ORDER — MIDAZOLAM HCL 2 MG/2ML IJ SOLN
INTRAMUSCULAR | Status: DC | PRN
  Administered 2016-12-01: 2 mg via INTRAVENOUS

## 2016-12-01 MED ORDER — SUCCINYLCHOLINE CHLORIDE 20 MG/ML IJ SOLN
INTRAMUSCULAR | Status: DC | PRN
  Administered 2016-12-01: 140 mg via INTRAVENOUS

## 2016-12-01 MED ORDER — IOPAMIDOL (ISOVUE-300) INJECTION 61%
125.0000 mL | Freq: Once | INTRAVENOUS | Status: AC | PRN
  Administered 2016-12-01: 125 mL via INTRAVENOUS

## 2016-12-01 MED ORDER — SUGAMMADEX SODIUM 500 MG/5ML IV SOLN
INTRAVENOUS | Status: DC | PRN
  Administered 2016-12-01: 375 mg via INTRAVENOUS

## 2016-12-01 MED ORDER — SUGAMMADEX SODIUM 500 MG/5ML IV SOLN
INTRAVENOUS | Status: AC
  Filled 2016-12-01: qty 5

## 2016-12-01 MED ORDER — ROCURONIUM BROMIDE 50 MG/5ML IV SOLN
INTRAVENOUS | Status: AC
  Filled 2016-12-01: qty 1

## 2016-12-01 MED ORDER — FENTANYL CITRATE (PF) 100 MCG/2ML IJ SOLN
INTRAMUSCULAR | Status: DC | PRN
  Administered 2016-12-01: 50 ug via INTRAVENOUS
  Administered 2016-12-01 (×2): 100 ug via INTRAVENOUS

## 2016-12-01 SURGICAL SUPPLY — 52 items
APPLIER CLIP 11 MED OPEN (CLIP)
APPLIER CLIP 13 LRG OPEN (CLIP)
BLADE CLIPPER SURG (BLADE) ×2 IMPLANT
BULB RESERV EVAC DRAIN JP 100C (MISCELLANEOUS) ×4 IMPLANT
CANISTER SUCT 1200ML W/VALVE (MISCELLANEOUS) ×2 IMPLANT
CHLORAPREP W/TINT 26ML (MISCELLANEOUS) ×4 IMPLANT
CLIP APPLIE 11 MED OPEN (CLIP) IMPLANT
CLIP APPLIE 13 LRG OPEN (CLIP) IMPLANT
CRADLE LAMINECT ARM (MISCELLANEOUS) ×4 IMPLANT
DECANTER SPIKE VIAL GLASS SM (MISCELLANEOUS) ×4 IMPLANT
DRAIN CHANNEL JP 19F (MISCELLANEOUS) ×4 IMPLANT
DRAPE LAPAROTOMY 100X77 ABD (DRAPES) ×2 IMPLANT
DRSG OPSITE POSTOP 4X8 (GAUZE/BANDAGES/DRESSINGS) ×2 IMPLANT
DRSG TELFA 3X8 NADH (GAUZE/BANDAGES/DRESSINGS) IMPLANT
ELECT BLADE 6.5 EXT (BLADE) ×2 IMPLANT
ELECT REM PT RETURN 9FT ADLT (ELECTROSURGICAL) ×2
ELECTRODE REM PT RTRN 9FT ADLT (ELECTROSURGICAL) ×1 IMPLANT
GAUZE SPONGE 4X4 12PLY STRL (GAUZE/BANDAGES/DRESSINGS) IMPLANT
GLOVE BIO SURGEON STRL SZ7 (GLOVE) ×16 IMPLANT
GLOVE BIO SURGEON STRL SZ8 (GLOVE) ×4 IMPLANT
GLOVE BIOGEL PI IND STRL 7.0 (GLOVE) ×6 IMPLANT
GLOVE BIOGEL PI INDICATOR 7.0 (GLOVE) ×6
GOWN STRL REUS W/ TWL LRG LVL3 (GOWN DISPOSABLE) ×4 IMPLANT
GOWN STRL REUS W/TWL LRG LVL3 (GOWN DISPOSABLE) ×4
HANDLE SUCTION POOLE (INSTRUMENTS) ×1 IMPLANT
HOLDER FOLEY CATH W/STRAP (MISCELLANEOUS) ×2 IMPLANT
MESH BIO-A 20X20 SYNTHE ×2 IMPLANT
NEEDLE HYPO 22GX1.5 SAFETY (NEEDLE) ×2 IMPLANT
NEEDLE HYPO 25X1 1.5 SAFETY (NEEDLE) ×2 IMPLANT
NEEDLE SPNL 22GX1.5 QUINCKE BK (NEEDLE) IMPLANT
NS IRRIG 500ML POUR BTL (IV SOLUTION) ×2 IMPLANT
PACK BASIN MAJOR ARMC (MISCELLANEOUS) ×4 IMPLANT
PACK BASIN MINOR ARMC (MISCELLANEOUS) IMPLANT
RETRACTOR TRAXI PANNICULUS (MISCELLANEOUS) IMPLANT
SPONGE DRAIN TRACH 4X4 STRL 2S (GAUZE/BANDAGES/DRESSINGS) ×4 IMPLANT
SPONGE LAP 18X18 5 PK (GAUZE/BANDAGES/DRESSINGS) ×6 IMPLANT
STAPLER SKIN PROX 35W (STAPLE) ×2 IMPLANT
SUCTION POOLE HANDLE (INSTRUMENTS) ×2
SUT ETHIBOND 0 (SUTURE) ×2 IMPLANT
SUT ETHIBOND 0 MO6 C/R (SUTURE) ×2 IMPLANT
SUT ETHIBOND CT1 BRD #0 30IN (SUTURE) ×16 IMPLANT
SUT ETHIBOND NAB MO 7 #0 18IN (SUTURE) IMPLANT
SUT PDS AB 1 CT1 27 (SUTURE) ×12 IMPLANT
SUT SILK 2 0SH CR/8 30 (SUTURE) ×2 IMPLANT
SUT VIC AB 2-0 SH 27 (SUTURE) ×4
SUT VIC AB 2-0 SH 27XBRD (SUTURE) ×4 IMPLANT
SYR 20CC LL (SYRINGE) ×2 IMPLANT
SYR 30ML LL (SYRINGE) ×4 IMPLANT
TAPE MICROFOAM 4IN (TAPE) ×2 IMPLANT
TRAXI PANNICULUS RETRACTOR (MISCELLANEOUS)
TRAY FOLEY W/METER SILVER 16FR (SET/KITS/TRAYS/PACK) ×2 IMPLANT
WATER STERILE IRR 1000ML POUR (IV SOLUTION) ×2 IMPLANT

## 2016-12-01 NOTE — ED Triage Notes (Signed)
August had coloscopy and endoscopy. Constipation. Feels that he may have hurt hernia site from straining (surgery x 2 last one in 2013).

## 2016-12-01 NOTE — Progress Notes (Signed)
Preoperative Review   Patient is met in the preoperative holding area. The history is reviewed in the chart and with the patient. I personally reviewed the options and rationale as well as the risks of this procedure that have been previously discussed with the patient. All questions asked by the patient and/or family were answered to their satisfaction. I have type and cross him for RBC and FFP.  Patient agrees to proceed with this procedure at this time.  Caroleen Hamman M.D. FACS

## 2016-12-01 NOTE — Anesthesia Preprocedure Evaluation (Signed)
Anesthesia Evaluation  Patient identified by MRN, date of birth, ID band Patient awake    Reviewed: Allergy & Precautions, NPO status , Patient's Chart, lab work & pertinent test results, reviewed documented beta blocker date and time   Airway Mallampati: III  TM Distance: >3 FB     Dental  (+) Chipped   Pulmonary shortness of breath and with exertion, sleep apnea and Continuous Positive Airway Pressure Ventilation , former smoker,    Pulmonary exam normal        Cardiovascular hypertension, Pt. on medications and Pt. on home beta blockers +CHF and + Orthopnea  + dysrhythmias Atrial Fibrillation + Valvular Problems/Murmurs AS  Rhythm:Irregular     Neuro/Psych PSYCHIATRIC DISORDERS Anxiety Depression negative neurological ROS     GI/Hepatic negative GI ROS, Neg liver ROS,   Endo/Other  diabetes, Well Controlled, Type 2Morbid obesity  Renal/GU negative Renal ROS  negative genitourinary   Musculoskeletal negative musculoskeletal ROS (+)   Abdominal (+) + obese,   Peds negative pediatric ROS (+)  Hematology  (+) anemia ,   Anesthesia Other Findings A fib. Valve replaacement. Smoke quit in [place].  Reproductive/Obstetrics                             Anesthesia Physical  Anesthesia Plan  ASA: III and emergent  Anesthesia Plan: General   Post-op Pain Management:    Induction: Intravenous, Rapid sequence and Cricoid pressure planned  PONV Risk Score and Plan:   Airway Management Planned: Oral ETT  Additional Equipment:   Intra-op Plan:   Post-operative Plan: Extubation in OR  Informed Consent: I have reviewed the patients History and Physical, chart, labs and discussed the procedure including the risks, benefits and alternatives for the proposed anesthesia with the patient or authorized representative who has indicated his/her understanding and acceptance.   Dental advisory  given  Plan Discussed with: CRNA and Surgeon  Anesthesia Plan Comments:         Anesthesia Quick Evaluation

## 2016-12-01 NOTE — Progress Notes (Signed)
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 Patient with multiple comorbidities including CHF, status post aortic valve replacement on anticoagulation. History of A. fib CHF, hypertension, sleep apnea. Discussed with the patient and the family again in detail about the operation. I plan to do an open ventral hernia repair possibly biological mesh placement. Possible bowel resection. Does with the patient and the family extensively about potential complications including but not limited to: Bleeding, infection, recurrence, prolonged hospitalization, multiple re-interventions, need for blood transfusion, need for transferring, need for fistulas and bowel injuries. Even death was discussed. Also we talked about transferring him to give [facility]. He expresses wishes to stay here. I do think that transferring we will also carry its own risk including delayed in his surgical care that we'll increase the chances of bowel ischemia and sepsis. After an extensive discussion with the patient and the family and they have agreed to proceed with surgical revision. Discussed with the anesthesia team in detail. We will keep him intubated and transferred to the ICU at least overnight.  He has been type and cross for rbc and FFP

## 2016-12-01 NOTE — ED Triage Notes (Signed)
Pt states has hx of hernia and now has a mass which is causing stomach pain and states sometimes feels like he can't get his breath

## 2016-12-01 NOTE — H&P (Signed)
Russell Ray is an 46 y.o. male.    Chief Complaint: inc VH  HPI: This a patient with acute onset of abdominal pain with a mass started about 11:30 this morning. He has had a known hernia for over a year but did not seek medical attention for it it has been causing him minimal pain for the year but today it happened when he was straining and noticed it popped out and caused him tremendous pain. It is worse now than it was this morning he's had no nausea or vomiting no fevers or chills had a normal bowel movement yesterday.  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  Family history not significant for ventral hernias  He stopped smoking a year ago does not drink much alcohol. He works for a [employer] and does no heavy lifting.  Past Medical History:  Diagnosis Date  . Anxiety   . Aortic valve stenosis   . Atrial fibrillation ([place])   . CHF (congestive heart failure) ([place])   . Depression   . Diabetes mellitus without complication ([place])   . Hypertension     Past Surgical History:  Procedure Laterality Date  . APPLICATION OF WOUND VAC    . CARDIAC CATHETERIZATION Right 04/22/2015   Procedure: Right/Left Heart Cath and Coronary Angiography;  Surgeon: Dionisio David, MD;  Location: [facility];  Service: Cardiovascular;  Laterality: Right;  . CARDIAC VALVE REPLACEMENT    . COLONOSCOPY WITH PROPOFOL N/A 10/05/2016   Procedure: COLONOSCOPY WITH PROPOFOL;  Surgeon: Manya Silvas, MD;  Location: [facility] ENDOSCOPY;  Service: Endoscopy;  Laterality: N/A;  . debridement sternum    . ELECTROPHYSIOLOGIC STUDY N/A 05/05/2015   Procedure: Cardioversion;  Surgeon: Dionisio David, MD;  Location:  [facility] ORS;  Service: Cardiovascular;  Laterality: N/A;  . ELECTROPHYSIOLOGIC STUDY N/A 05/17/2015   Procedure: CARDIOVERSION;  Surgeon: Dionisio David, MD;  Location: [facility] ORS;  Service: Cardiovascular;  Laterality: N/A;  . ESOPHAGOGASTRODUODENOSCOPY (EGD) WITH PROPOFOL N/A 10/05/2016   Procedure: ESOPHAGOGASTRODUODENOSCOPY (EGD) WITH PROPOFOL;  Surgeon: Manya Silvas, MD;  Location: [facility] ENDOSCOPY;  Service: Endoscopy;  Laterality: N/A;  . GREATER OMENTAL FLAP CLOSURE    . HERNIA REPAIR     X3  . VALVE REPLACEMENT      Family History  Problem Relation Age of Onset  . Hypertension Mother   . Hypertension Maternal Grandmother   . Diabetes Maternal Grandmother    Social History:  reports that he has quit smoking. His smoking use included Cigarettes. He has a 56.00 pack-year smoking history. He has never used smokeless tobacco. He reports that he does not drink alcohol or use drugs.  Allergies: No Known Allergies   (Not in a hospital admission)   Review of Systems  Constitutional: Negative.   HENT: Negative.   Eyes: Negative.   Respiratory: Negative.   Cardiovascular: Negative.   Gastrointestinal: Positive for abdominal pain. Negative for blood in stool, constipation, diarrhea, heartburn, nausea and vomiting.  Genitourinary: Negative.   Musculoskeletal: Negative.   Skin: Negative.   Neurological: Negative.   Endo/Heme/Allergies: Negative.   Psychiatric/Behavioral: Negative.      Physical Exam:  BP (!) 133/102   Pulse 71   Temp 98.1  F (36.7 C) (Oral)   Resp (!) 24   Ht 6' (1.829 m)   Wt (!) 411 lb (186.4 kg)   SpO2 95%   BMI 55.74 kg/m   Physical Exam  Constitutional: He is oriented to person, place, and time and well-developed, well-nourished, and in no distress. No distress.  HENT:  Head: Normocephalic and atraumatic.  Eyes: Pupils are equal, round, and reactive to light. Right eye exhibits no discharge. Left eye exhibits no discharge. No scleral icterus.   Neck: Normal range of motion. No JVD present.  Cardiovascular: Normal rate, regular rhythm and normal heart sounds.   Pulmonary/Chest: Effort normal and breath sounds normal. No respiratory distress. He has no wheezes. He has no rales.  Abdominal: Soft. He exhibits distension. There is tenderness. There is no rebound and no guarding.  Massive morbidly obese abdomen. There is an upper midline incision. There is a sternal scar which is well-healed. There is a transverse abdominoplasty scar beneath his very large pannus in the area where his umbilicus would be expected there is no scar but there is a mass 1 to the left of midline and one to the right of midline both of which are quite tender and nonreducible. There is no overlying skin changes.  Musculoskeletal: Normal range of motion. He exhibits edema. He exhibits no tenderness.  Lymphadenopathy:    He has no cervical adenopathy.  Neurological: He is alert and oriented to person, place, and time.  Skin: Skin is warm and dry. No rash noted. He is not diaphoretic. No erythema.  Psychiatric: Mood and affect normal.  Vitals reviewed.       Results for orders placed or performed during the hospital encounter of 12/01/16 (from the past 48 hour(s))  Comprehensive metabolic panel     Status: Abnormal   Collection Time: 12/01/16  4:36 PM  Result Value Ref Range   Sodium 134 (L) 135 - 145 mmol/L   Potassium 3.4 (L) 3.5 - 5.1 mmol/L   Chloride 97 (L) 101 - 111 mmol/L   CO2 25 22 - 32 mmol/L   Glucose, Bld 191 (H) 65 - 99 mg/dL   BUN 19 6 - 20 mg/dL   Creatinine, Ser 0.79 0.61 - 1.24 mg/dL   Calcium 9.6 8.9 - 10.3 mg/dL   Total Protein 7.5 6.5 - 8.1 g/dL   Albumin 4.2 3.5 - 5.0 g/dL   AST 22 15 - 41 U/L   ALT 17 17 - 63 U/L   Alkaline Phosphatase 52 38 - 126 U/L   Total Bilirubin 0.5 0.3 - 1.2 mg/dL   GFR calc non Af Amer >60 >60 mL/min   GFR calc Af Amer >60 >60 mL/min    Comment: (NOTE) The eGFR has been calculated using the CKD EPI  equation. This calculation has not been validated in all clinical situations. eGFR's persistently <60 mL/min signify possible Chronic Kidney Disease.    Anion gap 12 5 - 15  CBC with Differential     Status: Abnormal   Collection Time: 12/01/16  4:36 PM  Result Value Ref Range   WBC 11.0 (H) 3.8 - 10.6 K/uL   RBC 4.31 (L) 4.40 - 5.90 MIL/uL   Hemoglobin 11.8 (L) 13.0 - 18.0 g/dL   HCT 34.7 (L) 40.0 - 52.0 %   MCV 80.4 80.0 - 100.0 fL   MCH 27.4 26.0 - 34.0 pg   MCHC 34.0 32.0 - 36.0 g/dL   RDW 15.1 (H) 11.5 - 14.5 %  Platelets 305 150 - 440 K/uL   Neutrophils Relative % 82 %   Neutro Abs 9.1 (H) 1.4 - 6.5 K/uL   Lymphocytes Relative 13 %   Lymphs Abs 1.5 1.0 - 3.6 K/uL   Monocytes Relative 3 %   Monocytes Absolute 0.4 0.2 - 1.0 K/uL   Eosinophils Relative 1 %   Eosinophils Absolute 0.1 0 - 0.7 K/uL   Basophils Relative 1 %   Basophils Absolute 0.1 0 - 0.1 K/uL  Lactic acid, plasma     Status: Abnormal   Collection Time: 12/01/16  4:36 PM  Result Value Ref Range   Lactic Acid, Venous 2.0 (HH) 0.5 - 1.9 mmol/L    Comment: CRITICAL RESULT CALLED TO, READ BACK BY AND VERIFIED WITH IRIS GUIDRY ON 12/01/16 AT 1757 QSD   Protime-INR     Status: Abnormal   Collection Time: 12/01/16  4:46 PM  Result Value Ref Range   Prothrombin Time 23.1 (H) 11.4 - 15.2 seconds   INR 2.07    Ct Abdomen Pelvis W Contrast  Result Date: 12/01/2016 CLINICAL DATA:  Acute onset of epigastric abdominal pain and difficulty catching breath. Initial encounter. EXAM: CT ABDOMEN AND PELVIS WITH CONTRAST TECHNIQUE: Multidetector CT imaging of the abdomen and pelvis was performed using the standard protocol following bolus administration of intravenous contrast. CONTRAST:  173m ISOVUE-300 IOPAMIDOL (ISOVUE-300) INJECTION 61% COMPARISON:  CT of the abdomen and pelvis performed 03/18/2015 FINDINGS: Lower chest: Minimal atelectasis is noted at the right lung base. An aortic valve replacement is noted. The heart  remains normal in size. Hepatobiliary: The liver is unremarkable in appearance. The gallbladder is unremarkable in appearance. The common bile duct remains normal in caliber. Pancreas: The pancreas is within normal limits. Spleen: The spleen is unremarkable in appearance. Adrenals/Urinary Tract: A 2.3 cm hypodensity is noted at the right adrenal gland, relatively stable from 2017 and likely benign. The left adrenal gland is unremarkable in appearance. Nonspecific perinephric stranding is noted bilaterally, more prominent than on the prior study, with slightly suboptimal differentiation of renal parenchyma. Underlying mild bilateral pyelonephritis cannot be excluded. Hypodensities at the lower pole of the right kidney likely reflect small cysts. There is no evidence of hydronephrosis. No renal or ureteral stones are identified. Stomach/Bowel: The stomach is unremarkable in appearance. There is significant herniation of small bowel loops into two of the patient's anterior abdominal wall hernias, 1 of which is on the left side at the pannus, and another of which is smaller and on the right side. Additional small anterior abdominal wall hernias are noted more superiorly, and there is also an anterior hernia at the chest, reflecting prior surgery. A small Richter herniation of small bowel is noted at the upper abdomen. There is no evidence for bowel obstruction. The appendix is unremarkable in appearance. Stool is noted within a large postoperative bowel distention at proximal sigmoid colon. The colon is otherwise unremarkable in appearance. Vascular/Lymphatic: Scattered calcification is seen along the abdominal aorta and its branches. The abdominal aorta is otherwise grossly unremarkable. The inferior vena cava is grossly unremarkable. No retroperitoneal lymphadenopathy is seen. No pelvic sidewall lymphadenopathy is identified. Reproductive: The bladder is mildly distended and grossly unremarkable. The prostate remains  normal in size. Other: No additional soft tissue abnormalities are seen. Musculoskeletal: No acute osseous abnormalities are identified. Anterior bridging osteophytes are noted along the lower thoracic spine. The visualized musculature is unremarkable in appearance. IMPRESSION: 1. Significant herniation of small bowel loops into 2 of the  patient's anterior abdominal wall hernias, 1 of which is on the left side at the pannus, and another of which is smaller and on the right side. Small Richter herniation of small bowel at the upper abdomen. No evidence for bowel obstruction. These are all new from 2017. 2. Additional small anterior abdominal wall hernias noted more superiorly. Anterior hernia also noted at the chest wall, reflecting prior surgery. These are new from 2017. 3. Nonspecific bilateral perinephric stranding, with slightly suboptimal differentiation renal parenchyma, more prominent than on the prior study. Underlying mild bilateral pyelonephritis cannot be excluded. Would correlate with the patient's lab findings. 4. Hypodensities at the lower pole of the right kidney likely reflect small cysts. 5. Scattered aortic atherosclerosis. 6. Stable 2.3 cm hypodensity at the right adrenal gland is likely benign. Electronically Signed   By: Garald Balding M.D.   On: 12/01/2016 18:31     Assessment/Plan  This a patient with incarcerated ventral hernia. He has 2 ventral hernias on CT scan which is been personally reviewed. This is actually a recurrent hernia as he had a prior ventral hernia repair with mesh that became infected and needed to be removed. At that time he had an him bullectomy and I believe at the same time he had an abdominoplasty. This requires emergency repair due to the risk of ischemia to his bowel within the tight hernia.  Of additional significance is a mechanical heart valve and his anticoagulation which puts him at risk for both stroke and bleeding. His INR is slightly lower than normal  but still therapeutic. And he also has a history of prior infections including infected mesh and infected sternum.  The plan is for emergent repair tonight. I discussed with he and his wife the options of observation the rationale for repair and the potential for transfer to a higher level of care which she does not want to do. We discussed the risks of bleeding infection recurrence primary repair versus an allograft versus a plastic mesh and the inability to use a plastic mesh under most circumstances such as this due to the risk of infection which she has already experienced once before. I also discussed the high risk of recurrence risk of bowel injury the risk of bowel resection and especially the risk of stroke or bleeding related to his anticoagulation and mechanical heart valve.  This patient has been discussed with Dr. Dahlia Byes as well as the emergency room physician.  He has wife understood and agreed to proceed with this plan.  Florene Glen, MD, FACS

## 2016-12-01 NOTE — Anesthesia Procedure Notes (Signed)
Procedure Name: Intubation Date/Time: 12/01/2016 10:00 PM Performed by: Jonna Clark Pre-anesthesia Checklist: Patient identified, Patient being monitored, Timeout performed, Emergency Drugs available and Suction available Patient Re-evaluated:Patient Re-evaluated prior to induction Oxygen Delivery Method: Circle system utilized Preoxygenation: Pre-oxygenation with 100% oxygen Induction Type: IV induction Ventilation: Mask ventilation without difficulty Laryngoscope Size: Mac and 4 Grade View: Grade II Tube type: Oral Tube size: 8.0 mm Number of attempts: 1 Airway Equipment and Method: Stylet Placement Confirmation: ETT inserted through vocal cords under direct vision,  positive ETCO2 and breath sounds checked- equal and bilateral Secured at: 21 cm Tube secured with: Tape Dental Injury: Teeth and Oropharynx as per pre-operative assessment

## 2016-12-01 NOTE — ED Provider Notes (Signed)
[facility] Emergency Department Provider Note  ____________________________________________   First MD Initiated Contact with Patient 12/01/16 1629     (approximate)  I have reviewed the triage vital signs and the nursing notes.   HISTORY  Chief Complaint Mass   HPI Russell Ray is a 46 y.o. male who self presents to the emergency department with several hours of severe abdominal pain. His pain was sudden onset severe left sided abdominal pain. He has a complex past medical history including morbid obesity with multiple abdominal surgeries and multiple abdominal hernias. He said he is normally able to reduce this hernia, however he strained this morning and ever since it came out he has been unable to put it back in.   Past Medical History:  Diagnosis Date  . Anxiety   . Aortic valve stenosis   . Atrial fibrillation ([place])   . CHF (congestive heart failure) ([place])   . Depression   . Diabetes mellitus without complication ([place])   . Hypertension     Patient Active Problem List   Diagnosis Date Noted  . Ventral hernia with bowel obstruction 12/01/2016  . Atrial fibrillation ([place]) 05/17/2015  . Elevated troponin 05/05/2015  . Obesity 05/05/2015  . Obstructive sleep apnea 05/05/2015  . Hyponatremia 05/05/2015  . Leukocytosis 05/05/2015  . Anemia 05/05/2015  . Anticoagulated on Coumadin 05/05/2015  . H/O aortic valve replacement 05/05/2015  . A-fib ([place]) 05/03/2015  . Atrial fibrillation with RVR ([place]) 05/03/2015  . Chest pain, central 04/22/2015  . Morbid obesity ([place]) 03/19/2015  . Leg swelling 03/19/2015  . Orthopnea 03/19/2015  . Tobacco abuse 03/19/2015    Past Surgical History:  Procedure Laterality Date  . APPLICATION OF WOUND VAC    . CARDIAC CATHETERIZATION Right 04/22/2015   Procedure: Right/Left Heart Cath and Coronary Angiography;  Surgeon: Dionisio David, MD;  Location: [place] [facility];  Service: Cardiovascular;  Laterality:  Right;  . CARDIAC VALVE REPLACEMENT    . COLONOSCOPY WITH PROPOFOL N/A 10/05/2016   Procedure: COLONOSCOPY WITH PROPOFOL;  Surgeon: Manya Silvas, MD;  Location: [facility] ENDOSCOPY;  Service: Endoscopy;  Laterality: N/A;  . debridement sternum    . ELECTROPHYSIOLOGIC STUDY N/A 05/05/2015   Procedure: Cardioversion;  Surgeon: Dionisio David, MD;  Location: [facility] ORS;  Service: Cardiovascular;  Laterality: N/A;  . ELECTROPHYSIOLOGIC STUDY N/A 05/17/2015   Procedure: CARDIOVERSION;  Surgeon: Dionisio David, MD;  Location: [facility] ORS;  Service: Cardiovascular;  Laterality: N/A;  . ESOPHAGOGASTRODUODENOSCOPY (EGD) WITH PROPOFOL N/A 10/05/2016   Procedure: ESOPHAGOGASTRODUODENOSCOPY (EGD) WITH PROPOFOL;  Surgeon: Manya Silvas, MD;  Location: [facility] ENDOSCOPY;  Service: Endoscopy;  Laterality: N/A;  . GREATER OMENTAL FLAP CLOSURE    . HERNIA REPAIR     X3  . VALVE REPLACEMENT      Prior to Admission medications   Medication Sig Start Date End Date Taking? Authorizing Provider  acetaminophen (TYLENOL) 325 MG tablet Take 650 mg by mouth every 4 (four) hours as needed for moderate pain.   Yes [provider]  aspirin EC 81 MG tablet Take 81 mg by mouth daily.    Yes [provider]  chlorthalidone (HYGROTON) 25 MG tablet Take 25 mg by mouth daily.   Yes [provider]  cyanocobalamin 1000 MCG tablet Take 1,000 mcg by mouth daily.   Yes [provider]  cyclobenzaprine (FLEXERIL) 5 MG tablet Take 5 mg by mouth 3 (three) times daily as needed.   Yes [provider]  escitalopram (LEXAPRO) 10 MG tablet Take 5 mg by mouth at bedtime.  11/26/16  Yes [provider]  lisinopril (PRINIVIL,ZESTRIL) 20 MG tablet Take 20 mg by mouth daily. 11/03/16  Yes [provider]  LORazepam (ATIVAN) 1 MG tablet Take 1 mg by mouth 2 (two) times daily.  03/11/15  Yes [provider]  metoprolol succinate (TOPROL-XL) 25 MG 24 hr tablet Take 25 mg by mouth  daily.   Yes [provider]  ondansetron (ZOFRAN) 4 MG tablet Take 4 mg by mouth every 6 (six) hours as needed for nausea. 11/17/16  Yes [provider]  oxyCODONE (OXY IR/ROXICODONE) 5 MG immediate release tablet Take 1 tablet by mouth every 4 (four) hours as needed. 05/02/15  Yes [provider]  pioglitazone (ACTOS) 45 MG tablet Take 45 mg by mouth daily.   Yes [provider]  polyethylene glycol (MIRALAX / GLYCOLAX) packet Take 17 g by mouth daily as needed.   Yes [provider]  senna-docusate (SENOKOT-S) 8.6-50 MG tablet Take 1 tablet by mouth at bedtime as needed for mild constipation. 03/19/15  Yes Theodoro Grist, MD  warfarin (COUMADIN) 2 MG tablet Take 3 tablets (6 mg total) by mouth at bedtime. Patient taking differently: Take 4 mg by mouth at bedtime. Patient taking 4 mg T/TH/Sat/Sun 5 mg MWF 05/05/15  Yes Theodoro Grist, MD  amiodarone (PACERONE) 400 MG tablet Take 1 tablet (400 mg total) by mouth 2 (two) times daily. Patient not taking: Reported on 10/05/2016 05/06/15   Theodoro Grist, MD  furosemide (LASIX) 40 MG tablet Take 1 tablet (40 mg total) by mouth daily. Patient not taking: Reported on 10/05/2016 03/19/15   Theodoro Grist, MD    Allergies Patient has no known allergies.  Family History  Problem Relation Age of Onset  . Hypertension Mother   . Hypertension Maternal Grandmother   . Diabetes Maternal Grandmother     Social History Social History  Substance Use Topics  . Smoking status: Former Smoker    Packs/day: 2.00    Years: 28.00    Types: Cigarettes  . Smokeless tobacco: Never Used  . Alcohol use No    Review of Systems Constitutional: No fever/chills Eyes: No visual changes. ENT: No sore throat. Cardiovascular: Denies chest pain. Respiratory: Denies shortness of breath. Gastrointestinal: positive for abdominal pain.  No nausea, no vomiting.  No diarrhea.  No constipation. Genitourinary: Negative for  dysuria. Musculoskeletal: Negative for back pain. Skin: Negative for rash. Neurological: Negative for headaches, focal weakness or numbness.   ____________________________________________   PHYSICAL EXAM:  VITAL SIGNS: ED Triage Vitals [12/01/16 1618]  Enc Vitals Group     BP      Pulse      Resp      Temp      Temp src      SpO2      Weight (!) 411 lb (186.4 kg)     Height 6' (1.829 m)     Head Circumference      Peak Flow      Pain Score      Pain Loc      Pain Edu?      Excl. in [place]?     Constitutional: alert and oriented 4 appears extremely uncomfortable Eyes: PERRL EOMI. Head: Atraumatic. Nose: No congestion/rhinnorhea. Mouth/Throat: No trismus Neck: No stridor.   Cardiovascular: Normal rate, regular rhythm. Mechanical heart sounds  Good peripheral circulation.  well-healed sternotomy scar Respiratory: Normal respiratory effort.  No retractions. Lungs CTAB and moving good air Gastrointestinal: morbidly obese abdomen with large 8 cm hernia left of midline with no skin changes but is firm and I am unable to reduce it Musculoskeletal: No lower extremity edema   Neurologic:  Normal speech and language. No gross focal neurologic deficits are appreciated. Skin:  Skin is warm, dry and intact. No rash noted. Psychiatric: Mood and affect are normal. Speech and behavior are normal.    ____________________________________________   DIFFERENTIAL includes but not limited to  reducible hernia, incarcerated hernia, strangulated hernia, small bowel obstruction ____________________________________________   LABS (all labs ordered are listed, but only abnormal results are displayed)  Labs Reviewed  COMPREHENSIVE METABOLIC PANEL - Abnormal; Notable for the following:       Result Value   Sodium 134 (*)    Potassium 3.4 (*)    Chloride 97 (*)    Glucose, Bld 191 (*)    All other components within normal limits  CBC WITH DIFFERENTIAL/PLATELET - Abnormal; Notable for  the following:    WBC 11.0 (*)    RBC 4.31 (*)    Hemoglobin 11.8 (*)    HCT 34.7 (*)    RDW 15.1 (*)    Neutro Abs 9.1 (*)    All other components within normal limits  LACTIC ACID, PLASMA - Abnormal; Notable for the following:    Lactic Acid, Venous 2.0 (*)    All other components within normal limits  LACTIC ACID, PLASMA - Abnormal; Notable for the following:    Lactic Acid, Venous 2.3 (*)    All other components within normal limits  PROTIME-INR - Abnormal; Notable for the following:    Prothrombin Time 23.1 (*)    All other components within normal limits  TYPE AND SCREEN  PREPARE RBC (CROSSMATCH)  PREPARE FRESH FROZEN PLASMA  ABO/RH  TYPE AND SCREEN  SURGICAL PATHOLOGY    blood work reviewed and interpreted by me shows an elevated lactic acid which is concerning for early bowel ischemia __________________________________________  EKG  ED ECG REPORT I, Darel Hong, the attending physician, personally viewed and interpreted this ECG.  Date: 12/01/2016 EKG Time:  Rate: 74 Rhythm: normal sinus rhythm QRS Axis: normal Intervals: normal ST/T Wave abnormalities: T-wave inversion high lateral leads which is consistent with previous EKG from 08/04/2015 Narrative Interpretation: no evidence of acute ischemia specifically no dynamic changes  ____________________________________________  RADIOLOGY  CT scan of the abdomen and pelvis reviewed by me with multiple hernias although no signs of small bowel obstruction ____________________________________________   PROCEDURES  Procedure(s) performed: no  Procedures  Critical Care performed: no  Observation: no ____________________________________________   INITIAL IMPRESSION / ASSESSMENT AND PLAN / ED COURSE  Pertinent labs & imaging results that were available during my care of the patient were reviewed by me and considered in my medical decision making (see chart for details).  The patient arrives extremely  uncomfortable appearing with a morbidly obese abdomen     ----------------------------------------- 5:23 PM on 12/01/2016 -----------------------------------------  I discussed the case with on-call general surgeon Dr. Burt Knack who will kindly consult on the patient. He agrees with CT scan and continued analgesia.____________________________________________   ----------------------------------------- 6:39 PM on 12/01/2016 -----------------------------------------  The patient's CT scan is back and red is no small bowel obstruction but multiple hernias. Dr. Burt Knack is on his way to evaluate the patient.  Dr. Burt Knack recommends inpatient admission for operative management today.   FINAL CLINICAL IMPRESSION(S) / ED DIAGNOSES  Final diagnoses:  Incarcerated hernia      NEW MEDICATIONS STARTED DURING THIS VISIT:  Current Discharge Medication List       Note:  This document was prepared using Dragon voice recognition software and may include unintentional dictation errors.     Darel Hong, MD 12/02/16 [phone]

## 2016-12-01 NOTE — ED Notes (Signed)
Report called to Crystal in [place].  Consent signed with Catalina Antigua, RN and pt ready for OR.  Pre-op medications sent with pt via orderly to OR.

## 2016-12-02 DIAGNOSIS — K436 Other and unspecified ventral hernia with obstruction, without gangrene: Secondary | ICD-10-CM

## 2016-12-02 LAB — COMPREHENSIVE METABOLIC PANEL
ALBUMIN: 3.7 g/dL (ref 3.5–5.0)
ALT: 17 U/L (ref 17–63)
AST: 30 U/L (ref 15–41)
Alkaline Phosphatase: 50 U/L (ref 38–126)
Anion gap: 8 (ref 5–15)
BUN: 17 mg/dL (ref 6–20)
CHLORIDE: 99 mmol/L — AB (ref 101–111)
CO2: 25 mmol/L (ref 22–32)
Calcium: 8.8 mg/dL — ABNORMAL LOW (ref 8.9–10.3)
Creatinine, Ser: 0.78 mg/dL (ref 0.61–1.24)
GFR calc Af Amer: >60 mL/min (ref >60)
GFR calc non Af Amer: >60 mL/min (ref >60)
GLUCOSE: 216 mg/dL — AB (ref 65–99)
POTASSIUM: 4 mmol/L (ref 3.5–5.1)
Sodium: 132 mmol/L — ABNORMAL LOW (ref 135–145)
Total Bilirubin: 0.5 mg/dL (ref 0.3–1.2)
Total Protein: 6.9 g/dL (ref 6.5–8.1)

## 2016-12-02 LAB — CBC
HEMATOCRIT: 32 % — AB (ref 40.0–52.0)
Hemoglobin: 10.9 g/dL — ABNORMAL LOW (ref 13.0–18.0)
MCH: 27.9 pg (ref 26.0–34.0)
MCHC: 34.1 g/dL (ref 32.0–36.0)
MCV: 81.7 fL (ref 80.0–100.0)
PLATELETS: 267 10*3/uL (ref 150–440)
RBC: 3.91 MIL/uL — AB (ref 4.40–5.90)
RDW: 15.6 % — AB (ref 11.5–14.5)
WBC: 11.6 10*3/uL — AB (ref 3.8–10.6)

## 2016-12-02 LAB — APTT: aPTT: 33 seconds (ref 24–36)

## 2016-12-02 LAB — GLUCOSE, CAPILLARY
GLUCOSE-CAPILLARY: 221 mg/dL — AB (ref 65–99)
GLUCOSE-CAPILLARY: 194 mg/dL — AB (ref 65–99)
GLUCOSE-CAPILLARY: 215 mg/dL — AB (ref 65–99)
GLUCOSE-CAPILLARY: 207 mg/dL — AB (ref 65–99)
Glucose-Capillary: 191 mg/dL — ABNORMAL HIGH (ref 65–99)

## 2016-12-02 LAB — MRSA PCR SCREENING: MRSA by PCR: NEGATIVE

## 2016-12-02 LAB — PROTIME-INR
INR: 1.92
Prothrombin Time: 21.8 seconds — ABNORMAL HIGH (ref 11.4–15.2)

## 2016-12-02 MED ORDER — ASPIRIN EC 81 MG PO TBEC
81.0000 mg | DELAYED_RELEASE_TABLET | Freq: Every day | ORAL | Status: DC
  Administered 2016-12-02 – 2016-12-03 (×2): 81 mg via ORAL
  Filled 2016-12-02 (×2): qty 1

## 2016-12-02 MED ORDER — WARFARIN SODIUM 2 MG PO TABS
2.0000 mg | ORAL_TABLET | Freq: Once | ORAL | Status: AC
  Administered 2016-12-02: 2 mg via ORAL
  Filled 2016-12-02: qty 1

## 2016-12-02 MED ORDER — LORAZEPAM 1 MG PO TABS
1.0000 mg | ORAL_TABLET | Freq: Two times a day (BID) | ORAL | Status: DC
  Administered 2016-12-02 – 2016-12-03 (×4): 1 mg via ORAL
  Filled 2016-12-02 (×4): qty 1

## 2016-12-02 MED ORDER — WARFARIN SODIUM 4 MG PO TABS: 4.0000 mg | ORAL_TABLET | Freq: Every day | ORAL | Status: DC

## 2016-12-02 MED ORDER — ONDANSETRON 4 MG PO TBDP
4.0000 mg | ORAL_TABLET | Freq: Four times a day (QID) | ORAL | Status: DC | PRN
Start: 2016-12-02 — End: 2016-12-03

## 2016-12-02 MED ORDER — ONDANSETRON HCL 4 MG/2ML IJ SOLN
4.0000 mg | Freq: Four times a day (QID) | INTRAMUSCULAR | Status: DC | PRN
  Administered 2016-12-02: 4 mg via INTRAVENOUS
  Filled 2016-12-02: qty 2

## 2016-12-02 MED ORDER — METOPROLOL SUCCINATE ER 25 MG PO TB24
25.0000 mg | ORAL_TABLET | Freq: Every day | ORAL | Status: DC
Start: 2016-12-02 — End: 2016-12-03
  Administered 2016-12-02 – 2016-12-03 (×2): 25 mg via ORAL
  Filled 2016-12-02 (×2): qty 1

## 2016-12-02 MED ORDER — DIPHENHYDRAMINE HCL 50 MG/ML IJ SOLN: 12.5000 mg | Freq: Four times a day (QID) | INTRAMUSCULAR | Status: DC | PRN

## 2016-12-02 MED ORDER — LACTATED RINGERS IV SOLN
INTRAVENOUS | Status: DC
  Administered 2016-12-02 (×2): via INTRAVENOUS

## 2016-12-02 MED ORDER — INSULIN ASPART 100 UNIT/ML ~~LOC~~ SOLN
0.0000 [IU] | Freq: Three times a day (TID) | SUBCUTANEOUS | Status: DC
  Administered 2016-12-02: 4 [IU] via SUBCUTANEOUS
  Administered 2016-12-02 (×2): 7 [IU] via SUBCUTANEOUS
  Administered 2016-12-03: 4 [IU] via SUBCUTANEOUS
  Filled 2016-12-02 (×4): qty 1

## 2016-12-02 MED ORDER — WARFARIN - PHYSICIAN DOSING INPATIENT
Freq: Every day | Status: DC
  Administered 2016-12-02: 18:00:00

## 2016-12-02 MED ORDER — OXYCODONE HCL 5 MG PO TABS
5.0000 mg | ORAL_TABLET | ORAL | Status: DC | PRN
  Administered 2016-12-02 – 2016-12-03 (×6): 10 mg via ORAL
  Filled 2016-12-02 (×6): qty 2

## 2016-12-02 MED ORDER — HYDRALAZINE HCL 20 MG/ML IJ SOLN: 10.0000 mg | INTRAMUSCULAR | Status: DC | PRN

## 2016-12-02 MED ORDER — POLYETHYLENE GLYCOL 3350 17 G PO PACK: 17.0000 g | PACK | Freq: Every day | ORAL | Status: DC | PRN

## 2016-12-02 MED ORDER — HYDROMORPHONE HCL 1 MG/ML IJ SOLN
1.0000 mg | INTRAMUSCULAR | Status: DC | PRN
  Administered 2016-12-02 – 2016-12-03 (×7): 1 mg via INTRAVENOUS
  Filled 2016-12-02 (×8): qty 1

## 2016-12-02 MED ORDER — HYDROMORPHONE HCL 1 MG/ML IJ SOLN
1.0000 mg | INTRAMUSCULAR | Status: DC | PRN
  Filled 2016-12-02: qty 1

## 2016-12-02 MED ORDER — HYDROMORPHONE HCL 1 MG/ML IJ SOLN
1.0000 mg | Freq: Once | INTRAMUSCULAR | Status: AC
  Administered 2016-12-02: 1 mg via INTRAVENOUS
  Filled 2016-12-02: qty 1

## 2016-12-02 MED ORDER — ACETAMINOPHEN 500 MG PO TABS
1000.0000 mg | ORAL_TABLET | Freq: Four times a day (QID) | ORAL | Status: DC
  Administered 2016-12-02 – 2016-12-03 (×6): 1000 mg via ORAL
  Filled 2016-12-02 (×6): qty 2

## 2016-12-02 MED ORDER — ONDANSETRON HCL 4 MG PO TABS: 4.0000 mg | ORAL_TABLET | Freq: Four times a day (QID) | ORAL | Status: DC | PRN

## 2016-12-02 MED ORDER — WARFARIN SODIUM 5 MG PO TABS
5.0000 mg | ORAL_TABLET | Freq: Once | ORAL | Status: AC
  Administered 2016-12-02: 5 mg via ORAL
  Filled 2016-12-02: qty 1

## 2016-12-02 MED ORDER — CYCLOBENZAPRINE HCL 10 MG PO TABS
5.0000 mg | ORAL_TABLET | Freq: Three times a day (TID) | ORAL | Status: DC
  Administered 2016-12-02 – 2016-12-03 (×4): 5 mg via ORAL
  Filled 2016-12-02 (×2): qty 1
  Filled 2016-12-02 (×3): qty 0.5
  Filled 2016-12-02: qty 1

## 2016-12-02 MED ORDER — HEPARIN SODIUM (PORCINE) 5000 UNIT/ML IJ SOLN
5000.0000 [IU] | Freq: Three times a day (TID) | INTRAMUSCULAR | Status: DC
  Administered 2016-12-02 – 2016-12-03 (×4): 5000 [IU] via SUBCUTANEOUS
  Filled 2016-12-02 (×4): qty 1

## 2016-12-02 MED ORDER — MORPHINE SULFATE (PF) 4 MG/ML IV SOLN
4.0000 mg | INTRAVENOUS | Status: DC | PRN
  Administered 2016-12-02 (×2): 4 mg via INTRAVENOUS
  Filled 2016-12-02 (×2): qty 1

## 2016-12-02 MED ORDER — ENOXAPARIN SODIUM 40 MG/0.4ML ~~LOC~~ SOLN: 40.0000 mg | SUBCUTANEOUS | Status: DC

## 2016-12-02 MED ORDER — DIPHENHYDRAMINE HCL 12.5 MG/5ML PO ELIX
12.5000 mg | ORAL_SOLUTION | Freq: Four times a day (QID) | ORAL | Status: DC | PRN
  Filled 2016-12-02: qty 5

## 2016-12-02 MED ORDER — PANTOPRAZOLE SODIUM 40 MG IV SOLR
40.0000 mg | Freq: Every day | INTRAVENOUS | Status: DC
  Administered 2016-12-02 (×2): 40 mg via INTRAVENOUS
  Filled 2016-12-02 (×2): qty 40

## 2016-12-02 MED ORDER — INSULIN ASPART 100 UNIT/ML ~~LOC~~ SOLN
4.0000 [IU] | Freq: Three times a day (TID) | SUBCUTANEOUS | Status: DC
  Administered 2016-12-02 – 2016-12-03 (×4): 4 [IU] via SUBCUTANEOUS
  Filled 2016-12-02 (×4): qty 1

## 2016-12-02 MED ORDER — OXYCODONE HCL 5 MG PO TABS
5.0000 mg | ORAL_TABLET | ORAL | Status: DC | PRN
  Administered 2016-12-02 (×3): 10 mg via ORAL
  Filled 2016-12-02 (×3): qty 2

## 2016-12-02 NOTE — Transfer of Care (Signed)
Immediate Anesthesia Transfer of Care Note  Patient: Russell Ray  Procedure(s) Performed: HERNIA REPAIR VENTRAL ADULT with mesh (N/A Abdomen)  Patient Location: PACU and ICU  Anesthesia Type:General  Level of Consciousness: awake, alert  and oriented  Airway & Oxygen Therapy: Patient Spontanous Breathing and Patient connected to face mask oxygen  Post-op Assessment: Report given to RN and Post -op Vital signs reviewed and stable  Post vital signs: Reviewed and stable  Last Vitals:  Vitals:   12/01/16 1657 12/02/16 0019  BP: (!) 133/102 (!) 180/96  Pulse: 71 (!) 104  Resp: (!) 24 (!) 22  Temp: 36.7 C   SpO2: 95% 97%    Last Pain:  Vitals:   12/01/16 1657  TempSrc: Oral  PainSc: 10-Worst pain ever         Complications: No apparent anesthesia complications

## 2016-12-02 NOTE — Progress Notes (Addendum)
Pt up in room with abdominal binder on. Steady on feet.  Passing flatus. Wife at bedside.Transferred to room 205 per [facility]. Report called to [facility] RN

## 2016-12-02 NOTE — Op Note (Signed)
Abdominal Hernia Repair w BIOA MESH  Pre-operative Diagnosis: Recurrent incarcerated ventral hernia  Post-operative Diagnosis: Same  Surgeon: Caroleen Hamman, MD FACS  Anesthesia: Gen. with endotracheal tube  Findings: Incarcerated Ventral hernia Viable bowel   Estimated Blood Loss: 50cc         Drains: # 19 FR blake drains x 2         Specimens: Hernia sac       Complications: none          Procedure Details  The patient was seen again in the Holding Room. The benefits, complications, treatment options, and expected outcomes were discussed with the patient. The risks of bleeding, infection, recurrence of symptoms, failure to resolve symptoms, bowel injury, mesh placement, mesh infection, any of which could require further surgery were reviewed with the patient. The likelihood of improving the patient's symptoms with return to their baseline status is good.  The patient and/or family concurred with the proposed plan, giving informed consent.  The patient was taken to Operating Room, identified as Russell Ray and the procedure verified.  A Time Out was held and the above information confirmed.  Prior to the induction of general anesthesia, antibiotic prophylaxis was administered. VTE prophylaxis was in place. General endotracheal anesthesia was then administered and tolerated well. After the induction, the abdomen was prepped with Chloraprep and draped in the sterile fashion. The patient was positioned in the supine position.   Horizontal Incision was created with a scalpel over the hernia defect. Electrocautery was used to dissect through subcutaneous tissue, the hernia sac was opened and lysis of adhesion was performed with Metzenbaum's scissors.  There were 2 continuous defects goodbye by a ridge. I had to incise the fascia to be able to reduce the bowel back to the abdominal cavity. Bowel was viable w/o ischemia or necrosis.Bowel was reduced back to the abdominal wall.  Hernia sac was  excised. I closed the hernia defect primarily with several interrupted 0 Ethibond sutures. The defect was approximately 11 cm. I chose a 20 x 20 cm BIOA mesh, hydrated it and then tailor it to the hernia defect. The mesh was placed in an overrlay fashion Using PDS sutures to secure it to the abdominal wall.. The mesh layed really nicely against the  abdominal wall. 2 #19 [place] Blake drains were placed on top of the mesh. Substance tissue was closed with interrupted 2-0 Vicryls  Liposomal marcaine was placed throughout the abdominal wall to help with postoperative analgesia. Skin was closed with staplers Patient tolerated procedure well and there were no immediate complications. Needle and laparotomy counts were correct   Caroleen Hamman, MD, FACS

## 2016-12-02 NOTE — Progress Notes (Signed)
1 Day Post-Op  Subjective: Feels better is hungry and wants to eat. No nausea or vomiting table in ICU  Objective: Vital signs in last 24 hours: Temp:  [97.7 F (36.5 C)-98.1 F (36.7 C)] 98.1 F (36.7 C) (10/07 0500) Pulse Rate:  [71-104] 97 (10/07 0800) Resp:  [16-24] 20 (10/07 0600) BP: (115-180)/(58-102) 115/80 (10/07 0800) SpO2:  [95 %-98 %] 96 % (10/07 0800) Weight:  [371 lb 14.7 oz (168.7 kg)-411 lb (186.4 kg)] 371 lb 14.7 oz (168.7 kg) (10/07 0100) Last BM Date: 12/01/16  Intake/Output from previous day: 10/06 0701 - 10/07 0700 In: 3111 [I.V.:2840; Blood:271] Out: 1130 [Urine:1050; Drains:30; Blood:50] Intake/Output this shift: Total I/O In: 150 [I.V.:150] Out: 325 [Urine:325]  Physical exam:  Abdomen is morbidly obese otherwise soft nontender wound is dressed. Nontender calves  Lab Results: CBC   Recent Labs  12/01/16 1636 12/02/16 0148  WBC 11.0* 11.6*  HGB 11.8* 10.9*  HCT 34.7* 32.0*  PLT 305 267   BMET  Recent Labs  12/01/16 1636 12/02/16 0148  NA 134* 132*  K 3.4* 4.0  CL 97* 99*  CO2 25 25  GLUCOSE 191* 216*  BUN 19 17  CREATININE 0.79 0.78  CALCIUM 9.6 8.8*   PT/INR  Recent Labs  12/01/16 1646 12/02/16 0148  LABPROT 23.1* 21.8*  INR 2.07 1.92   ABG No results for input(s): PHART, HCO3 in the last 72 hours.  Invalid input(s): PCO2, PO2  Studies/Results: Ct Abdomen Pelvis W Contrast  Result Date: 12/01/2016 CLINICAL DATA:  Acute onset of epigastric abdominal pain and difficulty catching breath. Initial encounter. EXAM: CT ABDOMEN AND PELVIS WITH CONTRAST TECHNIQUE: Multidetector CT imaging of the abdomen and pelvis was performed using the standard protocol following bolus administration of intravenous contrast. CONTRAST:  144mL ISOVUE-300 IOPAMIDOL (ISOVUE-300) INJECTION 61% COMPARISON:  CT of the abdomen and pelvis performed 03/18/2015 FINDINGS: Lower chest: Minimal atelectasis is noted at the right lung base. An aortic valve  replacement is noted. The heart remains normal in size. Hepatobiliary: The liver is unremarkable in appearance. The gallbladder is unremarkable in appearance. The common bile duct remains normal in caliber. Pancreas: The pancreas is within normal limits. Spleen: The spleen is unremarkable in appearance. Adrenals/Urinary Tract: A 2.3 cm hypodensity is noted at the right adrenal gland, relatively stable from 2017 and likely benign. The left adrenal gland is unremarkable in appearance. Nonspecific perinephric stranding is noted bilaterally, more prominent than on the prior study, with slightly suboptimal differentiation of renal parenchyma. Underlying mild bilateral pyelonephritis cannot be excluded. Hypodensities at the lower pole of the right kidney likely reflect small cysts. There is no evidence of hydronephrosis. No renal or ureteral stones are identified. Stomach/Bowel: The stomach is unremarkable in appearance. There is significant herniation of small bowel loops into two of the patient's anterior abdominal wall hernias, 1 of which is on the left side at the pannus, and another of which is smaller and on the right side. Additional small anterior abdominal wall hernias are noted more superiorly, and there is also an anterior hernia at the chest, reflecting prior surgery. A small Richter herniation of small bowel is noted at the upper abdomen. There is no evidence for bowel obstruction. The appendix is unremarkable in appearance. Stool is noted within a large postoperative bowel distention at proximal sigmoid colon. The colon is otherwise unremarkable in appearance. Vascular/Lymphatic: Scattered calcification is seen along the abdominal aorta and its branches. The abdominal aorta is otherwise grossly unremarkable. The inferior vena cava  is grossly unremarkable. No retroperitoneal lymphadenopathy is seen. No pelvic sidewall lymphadenopathy is identified. Reproductive: The bladder is mildly distended and grossly  unremarkable. The prostate remains normal in size. Other: No additional soft tissue abnormalities are seen. Musculoskeletal: No acute osseous abnormalities are identified. Anterior bridging osteophytes are noted along the lower thoracic spine. The visualized musculature is unremarkable in appearance. IMPRESSION: 1. Significant herniation of small bowel loops into 2 of the patient's anterior abdominal wall hernias, 1 of which is on the left side at the pannus, and another of which is smaller and on the right side. Small Richter herniation of small bowel at the upper abdomen. No evidence for bowel obstruction. These are all new from 2017. 2. Additional small anterior abdominal wall hernias noted more superiorly. Anterior hernia also noted at the chest wall, reflecting prior surgery. These are new from 2017. 3. Nonspecific bilateral perinephric stranding, with slightly suboptimal differentiation renal parenchyma, more prominent than on the prior study. Underlying mild bilateral pyelonephritis cannot be excluded. Would correlate with the patient's lab findings. 4. Hypodensities at the lower pole of the right kidney likely reflect small cysts. 5. Scattered aortic atherosclerosis. 6. Stable 2.3 cm hypodensity at the right adrenal gland is likely benign. Electronically Signed   By: Garald Balding M.D.   On: 12/01/2016 18:31    Anti-infectives: Anti-infectives    Start     Dose/Rate Route Frequency Ordered Stop   12/01/16 2013  piperacillin-tazobactam (ZOSYN) 3-0.375 GM/50ML IVPB    Comments:  Odetta Pink   : cabinet override      12/01/16 2013 12/02/16 0829   12/01/16 1930  piperacillin-tazobactam (ZOSYN) IVPB 3.375 g     3.375 g 100 mL/hr over 30 Minutes Intravenous  Once 12/01/16 1926 12/01/16 2051   12/01/16 1930  vancomycin (VANCOCIN) IVPB 1000 mg/200 mL premix     1,000 mg 200 mL/hr over 60 Minutes Intravenous  Once 12/01/16 1920 12/01/16 2317      Assessment/Plan: s/p Procedure(s): HERNIA REPAIR  VENTRAL ADULT with mesh   INR is 1.9. Will restart Coumadin this morning and we'll use subcutaneous heparin only until INR returns to over 2.5. Feel he needs to be fully anticoagulated (IV heparin ) with the INR 1.9.  Will advance diet and moved to the floor. Adjust pain medications  Florene Glen, MD, FACS  12/02/2016

## 2016-12-02 NOTE — Consult Note (Signed)
PULMONARY / CRITICAL CARE MEDICINE   Name: Russell Ray MRN: [id number] DOB: Feb 26, 1971    ADMISSION DATE:  12/01/2016   CONSULTATION DATE:  12/02/2016  REFERRING MD:  Dr. Dahlia Byes  Reason: ICU management, status post hernia repair  CHIEF COMPLAINT:  Abdominal pain  HISTORY OF PRESENT ILLNESS:   This is a 46 year old Caucasian male with a past medical history of recurrent ventral hernia who was admitted with severe abdominal pain, ruled in for an incarcerated ventral hernia and was emergently taken for surgical repair. He is now post op and extubated on room air. He is complaining of abdominal pain which he rates as a 10 on 10; minimal relief with all that morphine and oxycodone. Denies chest pain, shortness of breath, nausea, vomiting and diarrhea.  PAST MEDICAL HISTORY :  He  has a past medical history of Anxiety; Aortic valve stenosis; Atrial fibrillation ([place]); CHF (congestive heart failure) ([place]); Depression; Diabetes mellitus without complication ([place]); and Hypertension.  PAST SURGICAL HISTORY: He  has a past surgical history that includes Hernia repair; Cardiac catheterization (Right, 04/22/2015); Valve replacement; Cardiac catheterization (N/A, 05/05/2015); Cardiac catheterization (N/A, 05/17/2015); Cardiac valve replacement; Application if wound vac; debridement sternum; Greater omental flap closure; Colonoscopy with propofol (N/A, 10/05/2016); and Esophagogastroduodenoscopy (egd) with propofol (N/A, 10/05/2016).  No Known Allergies  No current facility-administered medications on file prior to encounter.    Current Outpatient Prescriptions on File Prior to Encounter  Medication Sig  . acetaminophen (TYLENOL) 325 MG tablet Take 650 mg by mouth every 4 (four) hours as needed for moderate pain.  Marland Kitchen aspirin EC 81 MG tablet Take 81 mg by mouth daily.   . chlorthalidone (HYGROTON) 25 MG tablet Take 25 mg by mouth daily.  . cyanocobalamin 1000 MCG tablet Take 1,000 mcg by mouth daily.  .  cyclobenzaprine (FLEXERIL) 5 MG tablet Take 5 mg by mouth 3 (three) times daily as needed.  Marland Kitchen LORazepam (ATIVAN) 1 MG tablet Take 1 mg by mouth 2 (two) times daily.   . metoprolol succinate (TOPROL-XL) 25 MG 24 hr tablet Take 25 mg by mouth daily.  Marland Kitchen oxyCODONE (OXY IR/ROXICODONE) 5 MG immediate release tablet Take 1 tablet by mouth every 4 (four) hours as needed.  . pioglitazone (ACTOS) 45 MG tablet Take 45 mg by mouth daily.  . polyethylene glycol (MIRALAX / GLYCOLAX) packet Take 17 g by mouth daily as needed.  . senna-docusate (SENOKOT-S) 8.6-50 MG tablet Take 1 tablet by mouth at bedtime as needed for mild constipation.  Marland Kitchen warfarin (COUMADIN) 2 MG tablet Take 3 tablets (6 mg total) by mouth at bedtime. (Patient taking differently: Take 4 mg by mouth at bedtime. Patient taking 4 mg T/TH/Sat/Sun 5 mg MWF)  . amiodarone (PACERONE) 400 MG tablet Take 1 tablet (400 mg total) by mouth 2 (two) times daily. (Patient not taking: Reported on 10/05/2016)  . furosemide (LASIX) 40 MG tablet Take 1 tablet (40 mg total) by mouth daily. (Patient not taking: Reported on 10/05/2016)    FAMILY HISTORY:  His indicated that the status of his mother is unknown. He indicated that the status of his maternal grandmother is unknown.    SOCIAL HISTORY: He  reports that he has quit smoking. His smoking use included Cigarettes. He has a 56.00 pack-year smoking history. He has never used smokeless tobacco. He reports that he does not drink alcohol or use drugs.  REVIEW OF SYSTEMS:   Constitutional: Negative for fever and chills.  HENT: Negative for congestion and rhinorrhea.  Eyes: Negative for redness and visual disturbance.  Respiratory: Negative for shortness of breath and wheezing.  Cardiovascular: Negative for chest pain and palpitations.  Gastrointestinal: Negative  for nausea , vomiting  But positive for  abdominal pain Genitourinary: Negative for dysuria and urgency.  Endocrine: Denies polyuria, polyphagia  and heat intolerance Musculoskeletal: Negative for myalgias and arthralgias.  Skin: Negative for pallor and wound.  Neurological: Negative for dizziness and headaches   SUBJECTIVE:   VITAL SIGNS: BP 120/63   Pulse 91   Temp 97.7 F (36.5 C) (Oral)   Resp 20   Ht 6' (1.829 m)   Wt (!) 371 lb 14.7 oz (168.7 kg)   SpO2 98%   BMI 50.44 kg/m   HEMODYNAMICS:    VENTILATOR SETTINGS:    INTAKE / OUTPUT: No intake/output data recorded.  PHYSICAL EXAMINATION: General:  NAD Neuro:  AAO X4, no focal deficits HEENT:  [place]/AT, PERRLA, trachea midline, no JVD Cardiovascular:  RRR, S1/S2, no MRG Lungs:  CTAB, no wheezing Abdomen:  Obese, non-distended, multiple surgical scars, left  Musculoskeletal:  No deformities, =rom Skin:  Warm and dry  LABS:  BMET  Recent Labs Lab 12/01/16 1636 12/02/16 0148  NA 134* 132*  K 3.4* 4.0  CL 97* 99*  CO2 25 25  BUN 19 17  CREATININE 0.79 0.78  GLUCOSE 191* 216*    Electrolytes  Recent Labs Lab 12/01/16 1636 12/02/16 0148  CALCIUM 9.6 8.8*    CBC  Recent Labs Lab 12/01/16 1636 12/02/16 0148  WBC 11.0* 11.6*  HGB 11.8* 10.9*  HCT 34.7* 32.0*  PLT 305 267    Coag's  Recent Labs Lab 12/01/16 1646 12/02/16 0148  APTT  --  33  INR 2.07  --     Sepsis Markers  Recent Labs Lab 12/01/16 1636 12/01/16 2002  LATICACIDVEN 2.0* 2.3*    ABG No results for input(s): PHART, PCO2ART, PO2ART in the last 168 hours.  Liver Enzymes  Recent Labs Lab 12/01/16 1636 12/02/16 0148  AST 22 30  ALT 17 17  ALKPHOS 52 50  BILITOT 0.5 0.5  ALBUMIN 4.2 3.7    Cardiac Enzymes No results for input(s): TROPONINI, PROBNP in the last 168 hours.  Glucose  Recent Labs Lab 12/02/16 0020  GLUCAP 191*    Imaging Ct Abdomen Pelvis W Contrast  Result Date: 12/01/2016 CLINICAL DATA:  Acute onset of epigastric abdominal pain and difficulty catching breath. Initial encounter. EXAM: CT ABDOMEN AND PELVIS WITH CONTRAST  TECHNIQUE: Multidetector CT imaging of the abdomen and pelvis was performed using the standard protocol following bolus administration of intravenous contrast. CONTRAST:  134mL ISOVUE-300 IOPAMIDOL (ISOVUE-300) INJECTION 61% COMPARISON:  CT of the abdomen and pelvis performed 03/18/2015 FINDINGS: Lower chest: Minimal atelectasis is noted at the right lung base. An aortic valve replacement is noted. The heart remains normal in size. Hepatobiliary: The liver is unremarkable in appearance. The gallbladder is unremarkable in appearance. The common bile duct remains normal in caliber. Pancreas: The pancreas is within normal limits. Spleen: The spleen is unremarkable in appearance. Adrenals/Urinary Tract: A 2.3 cm hypodensity is noted at the right adrenal gland, relatively stable from 2017 and likely benign. The left adrenal gland is unremarkable in appearance. Nonspecific perinephric stranding is noted bilaterally, more prominent than on the prior study, with slightly suboptimal differentiation of renal parenchyma. Underlying mild bilateral pyelonephritis cannot be excluded. Hypodensities at the lower pole of the right kidney likely reflect small cysts. There is no evidence of hydronephrosis.  No renal or ureteral stones are identified. Stomach/Bowel: The stomach is unremarkable in appearance. There is significant herniation of small bowel loops into two of the patient's anterior abdominal wall hernias, 1 of which is on the left side at the pannus, and another of which is smaller and on the right side. Additional small anterior abdominal wall hernias are noted more superiorly, and there is also an anterior hernia at the chest, reflecting prior surgery. A small Richter herniation of small bowel is noted at the upper abdomen. There is no evidence for bowel obstruction. The appendix is unremarkable in appearance. Stool is noted within a large postoperative bowel distention at proximal sigmoid colon. The colon is otherwise  unremarkable in appearance. Vascular/Lymphatic: Scattered calcification is seen along the abdominal aorta and its branches. The abdominal aorta is otherwise grossly unremarkable. The inferior vena cava is grossly unremarkable. No retroperitoneal lymphadenopathy is seen. No pelvic sidewall lymphadenopathy is identified. Reproductive: The bladder is mildly distended and grossly unremarkable. The prostate remains normal in size. Other: No additional soft tissue abnormalities are seen. Musculoskeletal: No acute osseous abnormalities are identified. Anterior bridging osteophytes are noted along the lower thoracic spine. The visualized musculature is unremarkable in appearance. IMPRESSION: 1. Significant herniation of small bowel loops into 2 of the patient's anterior abdominal wall hernias, 1 of which is on the left side at the pannus, and another of which is smaller and on the right side. Small Richter herniation of small bowel at the upper abdomen. No evidence for bowel obstruction. These are all new from 2017. 2. Additional small anterior abdominal wall hernias noted more superiorly. Anterior hernia also noted at the chest wall, reflecting prior surgery. These are new from 2017. 3. Nonspecific bilateral perinephric stranding, with slightly suboptimal differentiation renal parenchyma, more prominent than on the prior study. Underlying mild bilateral pyelonephritis cannot be excluded. Would correlate with the patient's lab findings. 4. Hypodensities at the lower pole of the right kidney likely reflect small cysts. 5. Scattered aortic atherosclerosis. 6. Stable 2.3 cm hypodensity at the right adrenal gland is likely benign. Electronically Signed   By: Garald Balding M.D.   On: 12/01/2016 18:31    STUDIES:  None  CULTURES: none  ANTIBIOTICS: Zosyn Vancomycin  SIGNIFICANT EVENTS: 12/02/16>surgical repair of incarcerated ventral hernia  LINES/TUBES: PIVs  DISCUSSION: 46 year old male presenting with an  incarcerated ventral hernia; now status post repair with stable vital signs..  ASSESSMENT  Recurrent Incarcerated ventral hernia, status post repair. Acute postoperative pain. History of omental flap, bullectomy and abdominoplasty History of atrial fibrillation. History of congestive heart failure. History of type 2 diabetes. History of hypertension History of aortic valve stenosis  PLAN Hemodynamics per ICU protocol Pain management with oxycodone and Dilaudid as needed. Advance diet per surgical recommendations Antibiotics for surgical prophylaxis postsurgery. Blood glucose monitoring with sliding scale insulin coverage. Resume all home medications GI and DVT prophylaxis  FAMILY  - Updates: Patient and spouse updated at bedside  - Inter-disciplinary family meet or Palliative Care meeting due by:  day [place]. [facility] ANP-BC Pulmonary and Critical Care Medicine [facility] Pager [phone] or [phone] 12/02/2016, 4:13 AM

## 2016-12-02 NOTE — Anesthesia Postprocedure Evaluation (Signed)
Anesthesia Post Note  Patient: Russell Ray  Procedure(s) Performed: HERNIA REPAIR VENTRAL ADULT with mesh (N/A Abdomen)  Patient location during evaluation: ICU Anesthesia Type: General Level of consciousness: awake and alert and oriented Pain management: pain level controlled Vital Signs Assessment: post-procedure vital signs reviewed and stable Respiratory status: spontaneous breathing Cardiovascular status: blood pressure returned to baseline Anesthetic complications: no     Last Vitals:  Vitals:   12/02/16 0500 12/02/16 0600  BP: 131/75 (!) 132/58  Pulse: 89 89  Resp: 20 20  Temp: 36.7 C   SpO2: 95% 96%    Last Pain:  Vitals:   12/02/16 0649  TempSrc:   PainSc: 6                  Kathy Wares

## 2016-12-02 NOTE — Anesthesia Post-op Follow-up Note (Signed)
Anesthesia QCDR form completed.        

## 2016-12-03 ENCOUNTER — Encounter: Payer: Self-pay | Admitting: Surgery

## 2016-12-03 LAB — TYPE AND SCREEN
ABO/RH(D): A POS
Antibody Screen: NEGATIVE
UNIT DIVISION: 0
UNIT DIVISION: 0
UNIT DIVISION: 0
Unit division: 0
Unit division: 0

## 2016-12-03 LAB — PREPARE FRESH FROZEN PLASMA
UNIT DIVISION: 0
Unit division: 0

## 2016-12-03 LAB — BPAM RBC
BLOOD PRODUCT EXPIRATION DATE: 201810112359
BLOOD PRODUCT EXPIRATION DATE: 201810172359
Blood Product Expiration Date: 201810112359
Blood Product Expiration Date: 201810132359
Blood Product Expiration Date: 201810302359
ISSUE DATE / TIME: 201810071146
ISSUE DATE / TIME: 201810062127
UNIT TYPE AND RH: 6200
Unit Type and Rh: 5100
Unit Type and Rh: 5100
Unit Type and Rh: 600
Unit Type and Rh: 600

## 2016-12-03 LAB — PREPARE RBC (CROSSMATCH)

## 2016-12-03 LAB — HIV ANTIBODY (ROUTINE TESTING W REFLEX): HIV SCREEN 4TH GENERATION: NONREACTIVE

## 2016-12-03 LAB — GLUCOSE, CAPILLARY: Glucose-Capillary: 186 mg/dL — ABNORMAL HIGH (ref 65–99)

## 2016-12-03 LAB — BPAM FFP
BLOOD PRODUCT EXPIRATION DATE: 201810112359
Blood Product Expiration Date: 201810112359
ISSUE DATE / TIME: 201810062127
UNIT TYPE AND RH: 6200
UNIT TYPE AND RH: 6200

## 2016-12-03 LAB — PROTIME-INR
INR: 2.24
PROTHROMBIN TIME: 24.6 s — AB (ref 11.4–15.2)

## 2016-12-03 MED ORDER — OXYCODONE HCL 5 MG PO TABS: 5.0000 mg | ORAL_TABLET | ORAL | 0 refills | Status: DC | PRN

## 2016-12-03 MED ORDER — DOCUSATE SODIUM 100 MG PO CAPS: 100.0000 mg | ORAL_CAPSULE | Freq: Two times a day (BID) | ORAL | 2 refills | Status: AC

## 2016-12-03 NOTE — Progress Notes (Signed)
Discharge instructions reviewed with the patient and his wife.  Teaching done about drain and wound care.  Dressing change supplies provided to the patient. Pt sent out via a wheelchair to his car

## 2016-12-03 NOTE — Discharge Instructions (Signed)
In addition to included general post-operative instructions for Open Repair of Recurrent Incisional Hernia,  Diet: Resume home heart healthy diet.   Activity: No heavy lifting >15 pounds (children, pets, laundry, garbage) or strenuous activity until follow-up, but light activity and walking are encouraged. Do not drive or drink alcohol if taking narcotic pain medications.  Wound care: Drain care as per instructions provided by RN. Tomorrow (12/04/2016), you may shower/get incision wet with soapy water and pat dry (do not rub incisions), but no baths or submerging incision underwater until follow-up.  Medications: Resume all home medications and stool softener while taking narcotic pain medication. For mild to moderate pain: acetaminophen (Tylenol) or ibuprofen (if no kidney disease). Combining Tylenol with alcohol can substantially increase your risk of causing liver disease. Narcotic pain medications, if prescribed, can be used for severe pain, though may cause nausea, constipation, and drowsiness. Do not combine Tylenol and Percocet within a 6 hour period as Percocet contains Tylenol. If you do not need the narcotic pain medication, you do not need to fill the prescription.  Call office [phone]) at any time if any questions, worsening pain, fevers/chills, bleeding, drainage from incision site, or other concerns.

## 2016-12-04 ENCOUNTER — Telehealth: Payer: Self-pay

## 2016-12-04 LAB — SURGICAL PATHOLOGY

## 2016-12-04 NOTE — Telephone Encounter (Signed)
Left message for patient to call office regarding post op  questions.

## 2016-12-04 NOTE — Telephone Encounter (Signed)
Post-op call made to patient at this time. Spoke with [employer]. Post-op interview questions below.  1. How are you feeling? Feeling okay having some pain  2. Is your pain controlled? Yes  3. What are you doing for the pain? Taking one pain pill every 3 hours as directed. He did ask if he runs out by Monday then what should he do? I advised him to take ibuprofen and tylenol in between taking his pain pill to help prolong his pain medication until Monday. I did advise him as well to walk as much as possible and that it would help with him not getting stiff.  4. Are you having any Nausea or Vomiting? none  5. Are you having any Fever or Chills? none  6. Are you having any Constipation or Diarrhea? Has yet to have a bowel movement. Stated that he is taking the stool softener and that he has some constipation problems anyway and taking the pain medication doesn't help. I advised him to try some Miralax as well he stated that he will give it a try since he has used it before.  7. Is there any Swelling or Bruising you are concerned about? None.   8. Do you have any questions or concerns at this time? Patient asked if his drains would be pulled on Monday as well as his staple being removed?  I advised patient that it will be determined based on his drainage output and that we only pull one drain at a time. He verbalized understanding.   Discussion: Patient reminded of appointment on 11/15 at 2:30. Patient verbalized understanding and stated that if he has questions or concerns that he will contact us.

## 2016-12-07 ENCOUNTER — Telehealth: Payer: Self-pay | Admitting: General Practice

## 2016-12-07 ENCOUNTER — Telehealth: Payer: Self-pay

## 2016-12-07 ENCOUNTER — Encounter: Payer: Self-pay | Admitting: Surgery

## 2016-12-07 ENCOUNTER — Ambulatory Visit (INDEPENDENT_AMBULATORY_CARE_PROVIDER_SITE_OTHER): Payer: BLUE CROSS/BLUE SHIELD | Admitting: Surgery

## 2016-12-07 VITALS — Ht 72.0 in

## 2016-12-07 DIAGNOSIS — K436 Other and unspecified ventral hernia with obstruction, without gangrene: Secondary | ICD-10-CM

## 2016-12-07 MED ORDER — OXYCODONE HCL 5 MG PO TABS: 5.0000 mg | ORAL_TABLET | Freq: Four times a day (QID) | ORAL | 0 refills | Status: DC | PRN

## 2016-12-07 NOTE — Telephone Encounter (Signed)
Spoke with patient at this time. He explained that his drains have drained 55cc from the left drain and 40cc from the right drain in the last 24 hours. According to patient, it sounds as though it is draining serosanginous drainage out of both drains. He is having some irritation at both drain sites. I explained to the patient that this is normal as they are foreign into his skin and will show some irritation until they come out but as much as they are draining at this time; drains do not need to be removed. Patient was argumentative on the phone and states, "The drains are coming out on Monday, like it or not. I am not losing my job and I need to go back to work."  Reviewed patient's appointment time and location with him on Monday, 12/10/16. He verbalizes understanding.

## 2016-12-07 NOTE — Telephone Encounter (Signed)
Patient called back once again. He is stating that one of his drains "is infected" and he is "going to be seen today here or somewhere else but these drains are coming out today." I spoke with our surgeon on call, Dr. Burt Knack. He will see patient at 2pm today in the office. Patient was informed of this information and verbalizes understanding.

## 2016-12-07 NOTE — Telephone Encounter (Signed)
Patient called said he had surgery done, said he has a drain tube coming out of his stomach,feels like its causing an infection, patient is asking if they can be pulled out, patient complains of being in pain, pain level being at a 5. Please call patient and advice. Please call patient and advice.

## 2016-12-07 NOTE — Patient Instructions (Addendum)
We will see you back on Monday as scheduled with Dr. Adonis Huguenin.  Take your prescription to your pharmacy to have this filled. Note: Your dosage has been changed to 1 tablet every 6 hours as needed.  We have removed 1 drain today. Your other drain may increase in amount during the next few days. This is normal. Make sure you shower daily, dry this area thoroughly, and then replace with a new dressing. Once the drainage stops, you do not need to replace the dressing.  Please call our office with any questions or concerns.  GENERAL POST-OPERATIVE PATIENT INSTRUCTIONS   WOUND CARE INSTRUCTIONS:  Keep a dry clean dressing on the wound if there is drainage. The initial bandage may be removed after 24 hours.  Once the wound has quit draining you may leave it open to air.  If clothing rubs against the wound or causes irritation and the wound is not draining you may cover it with a dry dressing during the daytime.  Try to keep the wound dry and avoid ointments on the wound unless directed to do so.  If the wound becomes bright red and painful or starts to drain infected material that is not clear, please contact your physician immediately.  If the wound is mildly pink and has a thick firm ridge underneath it, this is normal, and is referred to as a healing ridge.  This will resolve over the next 4-6 weeks.  BATHING: You may shower if you have been informed of this by your surgeon. However, Please do not submerge in a tub, hot tub, or pool until incisions are completely sealed or have been told by your surgeon that you may do so.  DIET:  You may eat any foods that you can tolerate.  It is a good idea to eat a high fiber diet and take in plenty of fluids to prevent constipation.  If you do become constipated you may want to take a mild laxative or take ducolax tablets on a daily basis until your bowel habits are regular.  Constipation can be very uncomfortable, along with straining, after recent  surgery.  ACTIVITY:  You are encouraged to cough and deep breath or use your incentive spirometer if you were given one, every 15-30 minutes when awake.  This will help prevent respiratory complications and low grade fevers post-operatively if you had a general anesthetic.  You may want to hug a pillow when coughing and sneezing to add additional support to the surgical area, if you had abdominal or chest surgery, which will decrease pain during these times.  You are encouraged to walk and engage in light activity for the next two weeks.  You should not lift more than 20 pounds, until 01/13/2017 as it could put you at increased risk for complications.  Twenty pounds is roughly equivalent to a plastic bag of groceries. At that time- Listen to your body when lifting, if you have pain when lifting, stop and then try again in a few days. Soreness after doing exercises or activities of daily living is normal as you get back in to your normal routine.  MEDICATIONS:  Try to take narcotic medications and anti-inflammatory medications, such as tylenol, ibuprofen, naprosyn, etc., with food.  This will minimize stomach upset from the medication.  Should you develop nausea and vomiting from the pain medication, or develop a rash, please discontinue the medication and contact your physician.  You should not drive, make important decisions, or operate machinery when taking  narcotic pain medication.  SUNBLOCK Use sun block to incision area over the next year if this area will be exposed to sun. This helps decrease scarring and will allow you avoid a permanent darkened area over your incision.  QUESTIONS:  Please feel free to call our office if you have any questions, and we will be glad to assist you. [phone]

## 2016-12-08 NOTE — Discharge Summary (Signed)
Physician Discharge Summary  Patient ID: Russell Ray MRN: [id number] DOB/AGE: 1970-05-07 46 y.o.  Admit date: 12/01/2016 Discharge date: 12/08/2016  Admission Diagnoses:  Discharge Diagnoses:  Active Problems:   Ventral hernia with bowel obstruction   Discharged Condition: good  Hospital Course: 46 y.o. male on therapeutic anticoagulation for a mechanical cardiac valve presented to [facility] ED for acute onsent abdominal pain x ~10 hours associated with protrusion of his chronic recurrent ventral abdominal wall hernia after straining. Exam and workup demonstrated incarcerated ventral abdominal wall hernia without evidence of obstruction. Informed consent was obtained and documented, after which patient underwent open repair of his incarcerated hernia with biologic mesh and placement of drains and was admitted intubated overnight to the ICU, where he was subsequently extubated.   Post-operatively, patient was transferred to med-surg floor where his pain was controlled and advancement of patient's diet and ambulation were well-tolerated. The remainder of patient's hospital course was essentially unremarkable, and discharge planning was initiated accordingly. Patient had insisted that he be discharged prior to anticoagulation being therapeutic, but agreed to stay one more day for anticoagulation. At time of discharge, drains continued to collect a significant amount of fluid and home drain care instructions/teaching were provided. Patient was then safely able to be discharged home with appropriate discharge instructions, medications, pain control, and outpatient follow-up after all of his and his family's questions were answered to their expressed satisfaction.  Consults: pulmonary/intensive care  Significant Diagnostic Studies: radiology: CT scan: incarcerated ventral abdominal wall hernia involving loops of non-obstructed small intestine  Treatments: surgery: Emergent open repair of incarcerated  recurrent ventral abdominal wall hernia with biologic mesh ([name], 12/02/2016)  Discharge Exam: Blood pressure (!) 143/64, pulse 73, temperature 97.8 F (36.6 C), temperature source Oral, resp. rate 19, height 6' (1.829 m), weight (!) 371 lb 14.7 oz (168.7 kg), SpO2 98 %. General appearance: alert, cooperative, no distress, morbidly obese and uncooperative GI: soft and morbidly obese with minimal peri-incisional tenderness, JP drains well-secured and filled with non-purulent clear serosanguinous fluid, incision well-approximated without erythema or drainage  Disposition: 01-Home or Self Care   Allergies as of 12/03/2016   No Known Allergies     Medication List    STOP taking these medications   oxyCODONE 5 MG immediate release tablet Commonly known as:  Oxy IR/ROXICODONE     TAKE these medications   acetaminophen 325 MG tablet Commonly known as:  TYLENOL Take 650 mg by mouth every 4 (four) hours as needed for moderate pain.   amiodarone 400 MG tablet Commonly known as:  PACERONE Take 1 tablet (400 mg total) by mouth 2 (two) times daily.   aspirin EC 81 MG tablet Take 81 mg by mouth daily.   chlorthalidone 25 MG tablet Commonly known as:  HYGROTON Take 25 mg by mouth daily.   cyanocobalamin 1000 MCG tablet Take 1,000 mcg by mouth daily.   cyclobenzaprine 5 MG tablet Commonly known as:  FLEXERIL Take 5 mg by mouth 3 (three) times daily as needed.   docusate sodium 100 MG capsule Commonly known as:  COLACE Take 1 capsule (100 mg total) by mouth 2 (two) times daily.   escitalopram 10 MG tablet Commonly known as:  LEXAPRO Take 5 mg by mouth at bedtime.   furosemide 40 MG tablet Commonly known as:  LASIX Take 1 tablet (40 mg total) by mouth daily.   lisinopril 20 MG tablet Commonly known as:  PRINIVIL,ZESTRIL Take 20 mg by mouth daily.   LORazepam 1 MG  tablet Commonly known as:  ATIVAN Take 1 mg by mouth 2 (two) times daily.   metoprolol succinate 25 MG 24 hr  tablet Commonly known as:  TOPROL-XL Take 25 mg by mouth daily.   ondansetron 4 MG tablet Commonly known as:  ZOFRAN Take 4 mg by mouth every 6 (six) hours as needed for nausea.   pioglitazone 45 MG tablet Commonly known as:  ACTOS Take 45 mg by mouth daily.   polyethylene glycol packet Commonly known as:  MIRALAX / GLYCOLAX Take 17 g by mouth daily as needed.   senna-docusate 8.6-50 MG tablet Commonly known as:  Senokot-S Take 1 tablet by mouth at bedtime as needed for mild constipation.   warfarin 2 MG tablet Commonly known as:  COUMADIN Take 3 tablets (6 mg total) by mouth at bedtime. What changed:  how much to take  additional instructions      Follow-up Information    Clayburn Pert, MD. Go on 12/10/2016.   Specialty:  General Surgery Why:  Monday at 2:15pm for hospital follow-up Contact information: [address] [place] [id number] [phone]           Signed: Vickie Epley 12/08/2016, 8:07 PM

## 2016-12-10 ENCOUNTER — Encounter: Payer: Self-pay | Admitting: General Surgery

## 2016-12-10 ENCOUNTER — Ambulatory Visit (INDEPENDENT_AMBULATORY_CARE_PROVIDER_SITE_OTHER): Payer: BLUE CROSS/BLUE SHIELD | Admitting: General Surgery

## 2016-12-10 VITALS — BP 134/72 | HR 103 | Temp 97.9°F | Ht 72.0 in | Wt >= 6400 oz

## 2016-12-10 DIAGNOSIS — Z4889 Encounter for other specified surgical aftercare: Secondary | ICD-10-CM

## 2016-12-10 MED ORDER — OXYCODONE HCL 5 MG PO TABS: 5.0000 mg | ORAL_TABLET | Freq: Four times a day (QID) | ORAL | 0 refills | Status: DC | PRN

## 2016-12-10 MED ORDER — AMOXICILLIN-POT CLAVULANATE 875-125 MG PO TABS: 1.0000 | ORAL_TABLET | Freq: Two times a day (BID) | ORAL | 0 refills | Status: DC

## 2016-12-10 NOTE — Progress Notes (Signed)
Outpatient Surgical Follow Up  12/10/2016  Russell Ray is an 46 y.o. male.   Chief Complaint  Patient presents with  . Routine Post Op    Recurrent incarcerated ventral hernia 12/01/16 Dr. Dahlia Byes    HPI: 46 year old male returns to clinic now 9 days status post incarcerated ventral hernia repair. Patient continues to have a JP drain in place with approximately 40 mL of serosanguineous drainage today. Patient reports this is about the amount that has been draining every day. Continues to have significant pain and request additional pain medications. States he is eating well but having decreased bowel function secondary to the narcotic use. He is on a stool softener for this. He denies any fevers, chills, chest pain, shortness of breath, nausea, vomiting. He strongly desires to have his drain removed.  Past Medical History:  Diagnosis Date  . Anxiety   . Aortic valve stenosis   . Atrial fibrillation ([place])   . CHF (congestive heart failure) ([place])   . Depression   . Diabetes mellitus without complication ([place])   . Hypertension     Past Surgical History:  Procedure Laterality Date  . APPLICATION OF WOUND VAC    . CARDIAC CATHETERIZATION Right 04/22/2015   Procedure: Right/Left Heart Cath and Coronary Angiography;  Surgeon: Dionisio David, MD;  Location: [place] CV LAB;  Service: Cardiovascular;  Laterality: Right;  . CARDIAC VALVE REPLACEMENT    . COLONOSCOPY WITH PROPOFOL N/A 10/05/2016   Procedure: COLONOSCOPY WITH PROPOFOL;  Surgeon: Manya Silvas, MD;  Location: [facility] ENDOSCOPY;  Service: Endoscopy;  Laterality: N/A;  . debridement sternum    . ELECTROPHYSIOLOGIC STUDY N/A 05/05/2015   Procedure: Cardioversion;  Surgeon: Dionisio David, MD;  Location: [facility] ORS;  Service: Cardiovascular;  Laterality: N/A;  . ELECTROPHYSIOLOGIC STUDY N/A 05/17/2015   Procedure: CARDIOVERSION;  Surgeon: Dionisio David, MD;  Location: [facility] ORS;  Service: Cardiovascular;  Laterality: N/A;  .  ESOPHAGOGASTRODUODENOSCOPY (EGD) WITH PROPOFOL N/A 10/05/2016   Procedure: ESOPHAGOGASTRODUODENOSCOPY (EGD) WITH PROPOFOL;  Surgeon: Manya Silvas, MD;  Location: [facility] ENDOSCOPY;  Service: Endoscopy;  Laterality: N/A;  . GREATER OMENTAL FLAP CLOSURE    . HERNIA REPAIR     X3  . VALVE REPLACEMENT    . VENTRAL HERNIA REPAIR N/A 12/01/2016   Procedure: HERNIA REPAIR VENTRAL ADULT with mesh;  Surgeon: Jules Husbands, MD;  Location: [facility] ORS;  Service: General;  Laterality: N/A;    Family History  Problem Relation Age of Onset  . Hypertension Mother   . Hypertension Maternal Grandmother   . Diabetes Maternal Grandmother     Social History:  reports that he has quit smoking. His smoking use included Cigarettes. He has a 56.00 pack-year smoking history. He has never used smokeless tobacco. He reports that he does not drink alcohol or use drugs.  Allergies: No Known Allergies  Medications reviewed.    ROS A multipoint review of systems was completed, all pertinent positives and negatives are documented in the history of present illness and remainder are negative   BP 134/72   Pulse (!) 103   Temp 97.9 F (36.6 C) (Oral)   Ht 6' (1.829 m)   Wt (!) 191.4 kg (422 lb)   BMI 57.23 kg/m   Physical Exam  Gen.: No acute distress Chest: Clear to auscultation  heart: Tachycardic Abdomen: Very large, soft, appropriately tender to palpation in his incision sites. Staples in place that are weeping serous fluid on examination. JP drain just cephalad  and lateral to this with blanchable hyperemia around it and a serosanguineous drainage within the drain.   No results found for this or any previous visit (from the past 48 hour(s)). No results found.  Assessment/Plan:  1. Aftercare following surgery 46 year old male status post ventral hernia repair. Continues to have significant amount of drainage from his JP drain. Discussed that we will provide him with additional pain medications  today and start him on antibiotic given his noticing the hyperemia worsening. This would be primarily for prophylaxis. Discussed that his operative surgeon will be in the clinic later this afternoon and we will discuss whether or not to remove his drain with him at that time. Discussed given the weeping from his staples that it is not time to remove his staples yet. We'll have him return to clinic on Thursday for additional wound check and staple removal at that time.  Patient seen also by Dr. Dahlia Byes who agrees with this plan. Patient will return to clinic on Thursday to assess staple and drain removal.    Clayburn Pert, MD [place] Surgeon  12/10/2016,2:46 PM

## 2016-12-10 NOTE — Patient Instructions (Signed)

## 2016-12-11 NOTE — Progress Notes (Signed)
patientcame into the office ollowing vental hernia repair.. His pain is minimal. His repair was emergent due to incarceration. His drain is putting out minimal one of which is still over 50 cc however both are serosanguineous.  One of the drains removed the other was left in place to be removed next week.  Patient will folnext week.

## 2016-12-13 ENCOUNTER — Ambulatory Visit (INDEPENDENT_AMBULATORY_CARE_PROVIDER_SITE_OTHER): Payer: BLUE CROSS/BLUE SHIELD | Admitting: General Surgery

## 2016-12-13 ENCOUNTER — Encounter: Payer: Self-pay | Admitting: General Surgery

## 2016-12-13 VITALS — BP 158/77 | HR 103 | Temp 97.6°F | Ht 72.0 in | Wt >= 6400 oz

## 2016-12-13 DIAGNOSIS — Z4889 Encounter for other specified surgical aftercare: Secondary | ICD-10-CM

## 2016-12-13 NOTE — Progress Notes (Signed)
Outpatient Surgical Follow Up  12/13/2016  Russell Russell Ray is an 46 y.o. Russell Ray.   Chief Complaint  Patient presents with  . Routine Post Op    Recurrent incarcerated ventral hernia 12/01/16 Dr. Dahlia Byes    HPI: 46 year old Russell Ray returns to clinic for follow-up now 12 days status post incarcerated ventral hernia repair. He is here today to discuss having his drain removed. He's only had 10 mL out thus far today from the drain and it is remained the clear to pink fluid. He denies any fevers, chills, nausea, vomiting, chest pain, shortness of breath, diarrhea, constipation. States his pain is currently well controlled and is otherwise doing well.  Past Medical History:  Diagnosis Date  . Anxiety   . Aortic valve stenosis   . Atrial fibrillation ([place])   . CHF (congestive heart failure) ([place])   . Depression   . Diabetes mellitus without complication ([place])   . Hypertension     Past Surgical History:  Procedure Laterality Date  . APPLICATION OF WOUND VAC    . CARDIAC CATHETERIZATION Right 04/22/2015   Procedure: Right/Left Heart Cath and Coronary Angiography;  Surgeon: Dionisio David, MD;  Location: [place] CV LAB;  Service: Cardiovascular;  Laterality: Right;  . CARDIAC VALVE REPLACEMENT    . COLONOSCOPY WITH PROPOFOL N/A 10/05/2016   Procedure: COLONOSCOPY WITH PROPOFOL;  Surgeon: Manya Silvas, MD;  Location: [facility] ENDOSCOPY;  Service: Endoscopy;  Laterality: N/A;  . debridement sternum    . ELECTROPHYSIOLOGIC STUDY N/A 05/05/2015   Procedure: Cardioversion;  Surgeon: Dionisio David, MD;  Location: [facility] ORS;  Service: Cardiovascular;  Laterality: N/A;  . ELECTROPHYSIOLOGIC STUDY N/A 05/17/2015   Procedure: CARDIOVERSION;  Surgeon: Dionisio David, MD;  Location: [facility] ORS;  Service: Cardiovascular;  Laterality: N/A;  . ESOPHAGOGASTRODUODENOSCOPY (EGD) WITH PROPOFOL N/A 10/05/2016   Procedure: ESOPHAGOGASTRODUODENOSCOPY (EGD) WITH PROPOFOL;  Surgeon: Manya Silvas, MD;  Location: [facility]  ENDOSCOPY;  Service: Endoscopy;  Laterality: N/A;  . GREATER OMENTAL FLAP CLOSURE    . HERNIA REPAIR     X3  . VALVE REPLACEMENT    . VENTRAL HERNIA REPAIR N/A 12/01/2016   Procedure: HERNIA REPAIR VENTRAL ADULT with mesh;  Surgeon: Jules Husbands, MD;  Location: [facility] ORS;  Service: General;  Laterality: N/A;    Family History  Problem Relation Age of Onset  . Hypertension Mother   . Hypertension Maternal Grandmother   . Diabetes Maternal Grandmother     Social History:  reports that he has quit smoking. His smoking use included Cigarettes. He has a 56.00 pack-year smoking history. He has never used smokeless tobacco. He reports that he does not drink alcohol or use drugs.  Allergies: No Known Allergies  Medications reviewed.    ROS A multipoint review of systems was completed, all pertinent positives and negatives are documented within the history of present illness and remainder are negative   BP (!) 158/77   Pulse (!) 103   Temp 97.6 F (36.4 C) (Oral)   Ht 6' (1.829 m)   Wt (!) 191 kg (421 lb)   BMI 57.10 kg/m   Physical Exam Gen.: No acute distress  chest: Clear to auscultation  heart: Regular rate and rhythm Abdomen: Large, soft, appropriate tender to palpation at the incision sites. JP drain with a serosanguineous fluid Staples in place without any evidence of erythema or drainage.    No results found for this or any previous visit (from the past 48 hour(s)). No results  found.  Assessment/Plan:  1. Aftercare following surgery 46 year old Russell Ray status post ventral hernia repair. JP drain removed today without difficulty.  Staples removed today and replaced with Steri-Strips. Discussed the signs and symptoms of infection and return to clinic immediately if they occur. Otherwise he'll follow-up in clinic next week for additional wound check and the remainder of his staples be removed.     Clayburn Pert, MD FACS General Surgeon  12/13/2016,11:54 AM

## 2016-12-13 NOTE — Patient Instructions (Addendum)
We have removed your drain today. This area will continue to drain some. Keep it clean and dry and covered at all times.  We also have removed half of your staples and placed steri stripes. These will fall of when then are ready. Please do not pull them off.  We will see you back as listed below for your follow up appointment and continue your antibiotics until finished.

## 2016-12-20 ENCOUNTER — Encounter: Payer: Self-pay | Admitting: Surgery

## 2016-12-20 ENCOUNTER — Ambulatory Visit (INDEPENDENT_AMBULATORY_CARE_PROVIDER_SITE_OTHER): Payer: BLUE CROSS/BLUE SHIELD | Admitting: Surgery

## 2016-12-20 VITALS — BP 130/77 | HR 96 | Temp 97.5°F | Ht 72.0 in | Wt >= 6400 oz

## 2016-12-20 DIAGNOSIS — Z09 Encounter for follow-up examination after completed treatment for conditions other than malignant neoplasm: Secondary | ICD-10-CM

## 2016-12-20 MED ORDER — OXYCODONE HCL 5 MG PO TABS: 5.0000 mg | ORAL_TABLET | Freq: Four times a day (QID) | ORAL | 0 refills | Status: DC | PRN

## 2016-12-20 NOTE — Patient Instructions (Signed)
We have given you a refill on your pain medication today as well as removed all remaining staples.  These have been replaced with steri stripes these will fall off on their own.    GENERAL POST-OPERATIVE PATIENT INSTRUCTIONS   WOUND CARE INSTRUCTIONS:  Keep a dry clean dressing on the wound if there is drainage. The initial bandage may be removed after 24 hours.  Once the wound has quit draining you may leave it open to air.  If clothing rubs against the wound or causes irritation and the wound is not draining you may cover it with a dry dressing during the daytime.  Try to keep the wound dry and avoid ointments on the wound unless directed to do so.  If the wound becomes bright red and painful or starts to drain infected material that is not clear, please contact your physician immediately.  If the wound is mildly pink and has a thick firm ridge underneath it, this is normal, and is referred to as a healing ridge.  This will resolve over the next 4-6 weeks.  BATHING: You may shower if you have been informed of this by your surgeon. However, Please do not submerge in a tub, hot tub, or pool until incisions are completely sealed or have been told by your surgeon that you may do so.  DIET:  You may eat any foods that you can tolerate.  It is a good idea to eat a high fiber diet and take in plenty of fluids to prevent constipation.  If you do become constipated you may want to take a mild laxative or take ducolax tablets on a daily basis until your bowel habits are regular.  Constipation can be very uncomfortable, along with straining, after recent surgery.  ACTIVITY:  You are encouraged to cough and deep breath or use your incentive spirometer if you were given one, every 15-30 minutes when awake.  This will help prevent respiratory complications and low grade fevers post-operatively if you had a general anesthetic.  You may want to hug a pillow when coughing and sneezing to add additional support to  the surgical area, if you had abdominal or chest surgery, which will decrease pain during these times.  You are encouraged to walk and engage in light activity for the next two weeks.  You should not lift more than 20 pounds, until 01/12/2017 as it could put you at increased risk for complications.  Twenty pounds is roughly equivalent to a plastic bag of groceries. At that time- Listen to your body when lifting, if you have pain when lifting, stop and then try again in a few days. Soreness after doing exercises or activities of daily living is normal as you get back in to your normal routine.  MEDICATIONS:  Try to take narcotic medications and anti-inflammatory medications, such as tylenol, ibuprofen, naprosyn, etc., with food.  This will minimize stomach upset from the medication.  Should you develop nausea and vomiting from the pain medication, or develop a rash, please discontinue the medication and contact your physician.  You should not drive, make important decisions, or operate machinery when taking narcotic pain medication.  SUNBLOCK Use sun block to incision area over the next year if this area will be exposed to sun. This helps decrease scarring and will allow you avoid a permanent darkened area over your incision.  QUESTIONS:  Please feel free to call our office if you have any questions, and we will be glad to assist you. [phone]  [phone]    

## 2016-12-20 NOTE — Progress Notes (Signed)
S/p ventral hernia repair w BIOA  Doing well No fevers or chills Some drainage from previous drain site Taking PO  PE NAD Abd: soft, minimal appropriate incisional tenderness, no peritonitis. No infection. No collections  A/p Doing well This will be his last narcotic refill, we made a pact and I told him that I do not want him to keep taking narcotics for a long time HE agrees and this will be my last script for him, HE may need pain consultation if he ask for some more No surgical intervention at this time F/U prn

## 2017-03-03 IMAGING — CT CT ANGIO CHEST
2 of 4 series · 17 of 31 positions shown · IV contrast (APPLIED)
Comparison: None.

CLINICAL DATA: Intermittent upper abdominal pain, constipation,
shortness of breath, cough

EXAM:
CT ANGIOGRAPHY CHEST
CT ABDOMEN AND PELVIS WITH CONTRAST
TECHNIQUE: Multidetector CT imaging of the chest was performed using the
standard protocol during bolus administration of intravenous
contrast. Multiplanar CT image reconstructions and MIPs were
obtained to evaluate the vascular anatomy. Multidetector CT imaging
of the abdomen and pelvis was performed using the standard protocol
during bolus administration of intravenous contrast.
CONTRAST:  125mL OMNIPAQUE IOHEXOL 350 MG/ML SOLN

[Series 5: routine abd pel with · axial · 0.91mm/px · z∈[+332,+612]mm · 4 of 114 slices shown]
[im 29/114  lung]
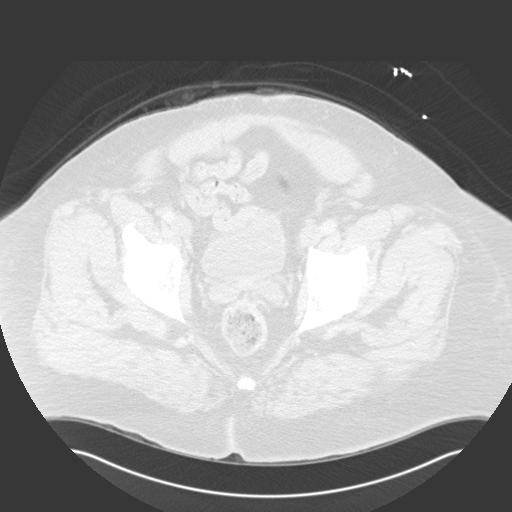
[im 57/114  lung]
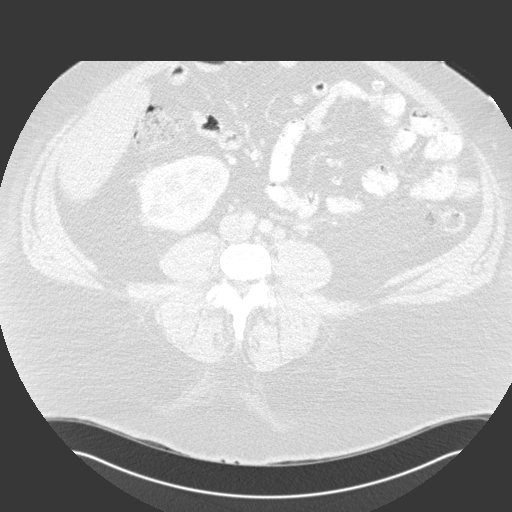
[im 71/114  lung]
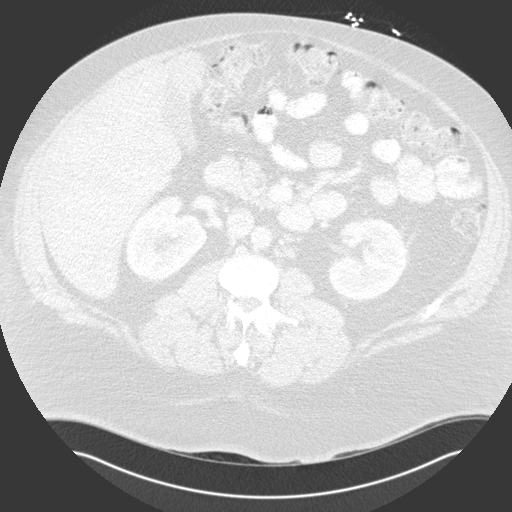
[im 85/114  lung]
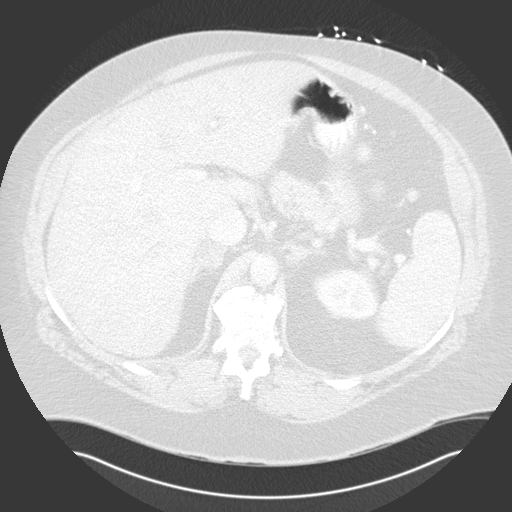

[Series 8: pe 1.0 thins · axial · 0.87mm/px · z∈[+632,+884]mm · 13 of 301 slices shown]
[im 24/301  lung]
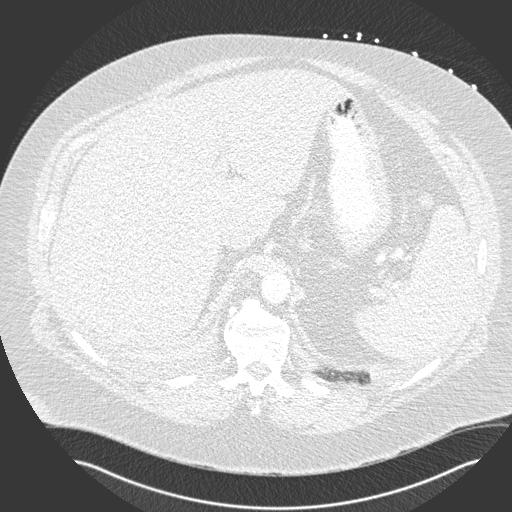
[im 47/301  mediastinal]
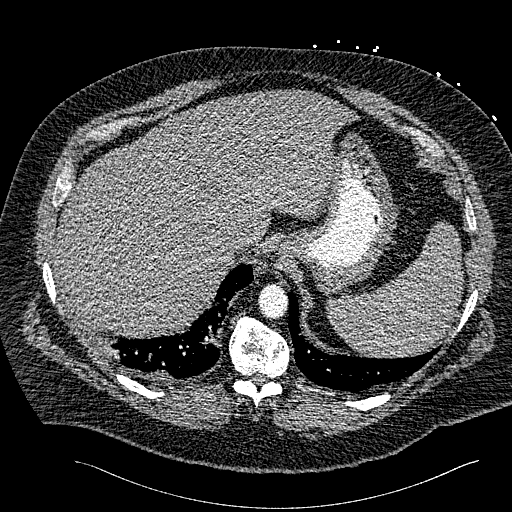
[im 70/301  lung]
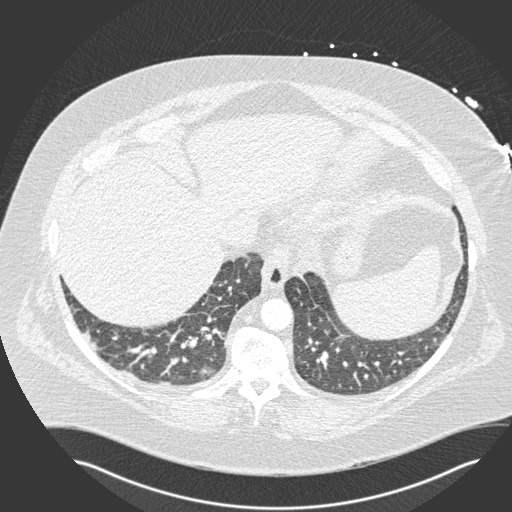
[im 93/301  mediastinal]
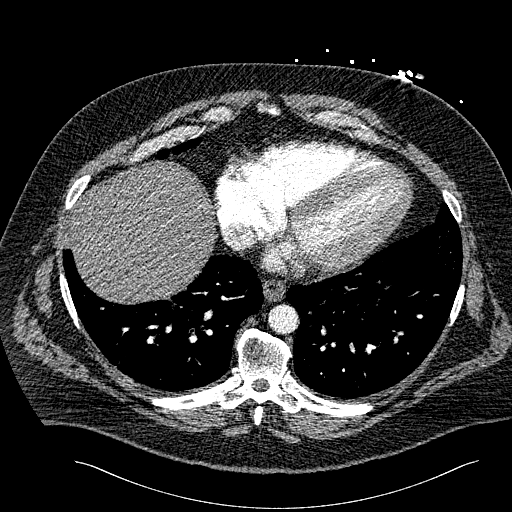
[im 116/301  lung]
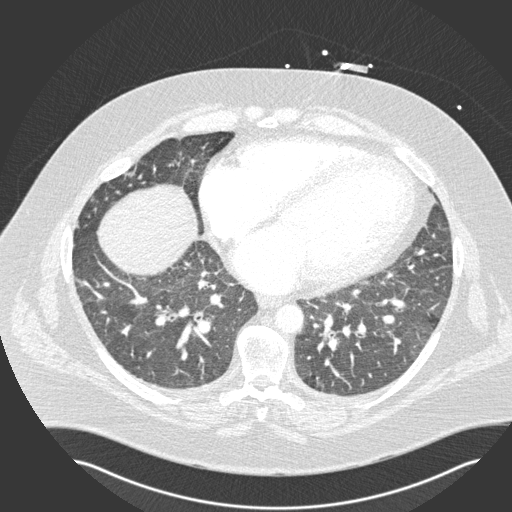
[im 139/301  mediastinal]
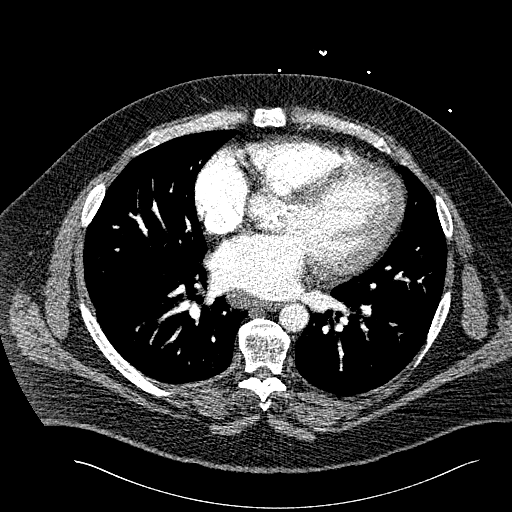
[im 151/301  lung]
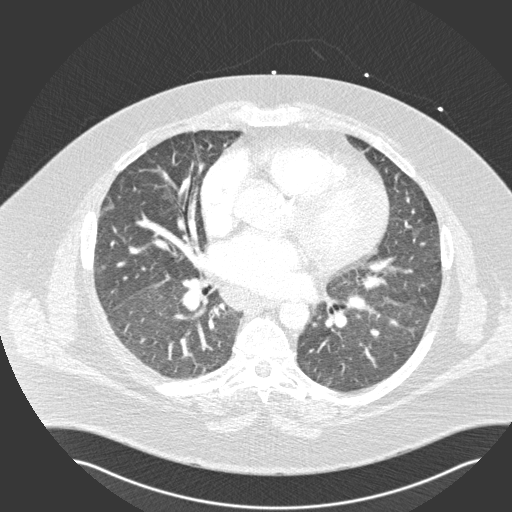
[im 162/301  mediastinal]
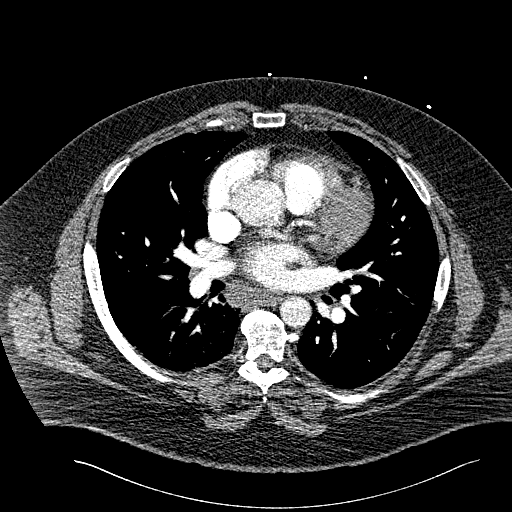
[im 185/301  lung]
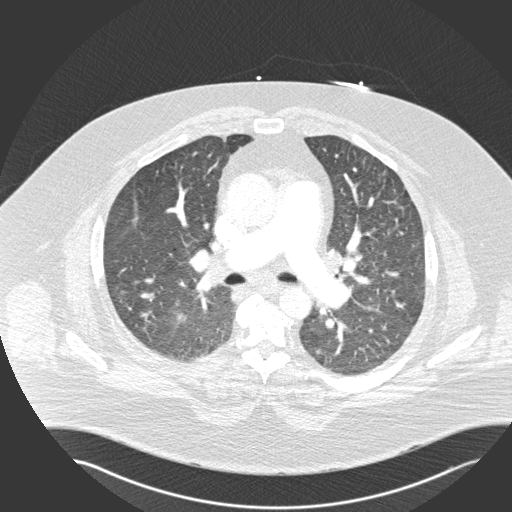
[im 208/301  mediastinal]
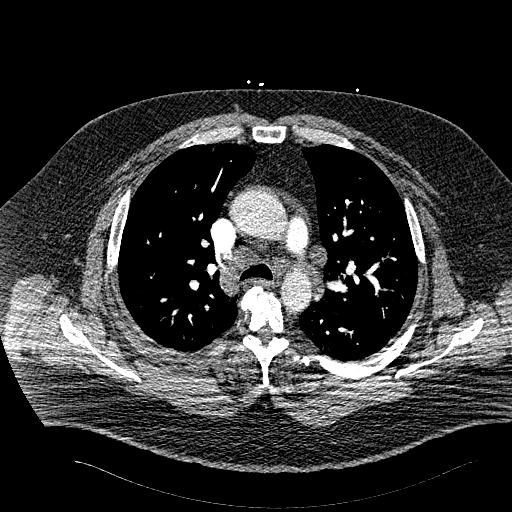
[im 231/301  lung]
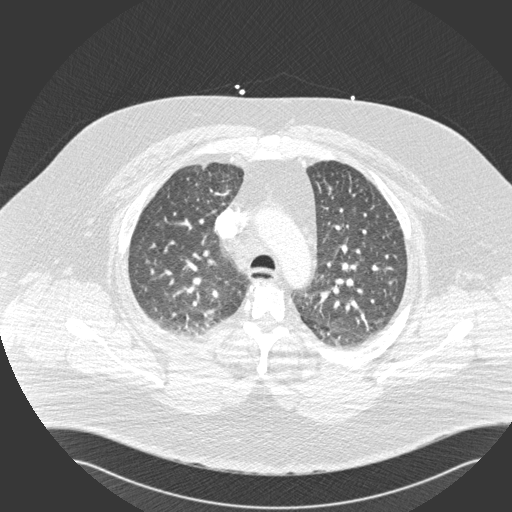
[im 254/301  mediastinal]
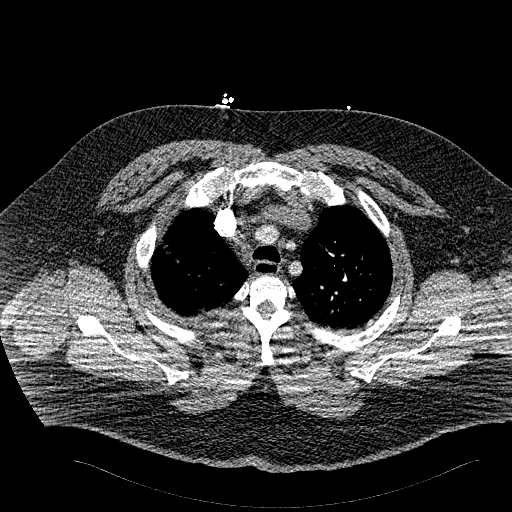
[im 277/301  lung]
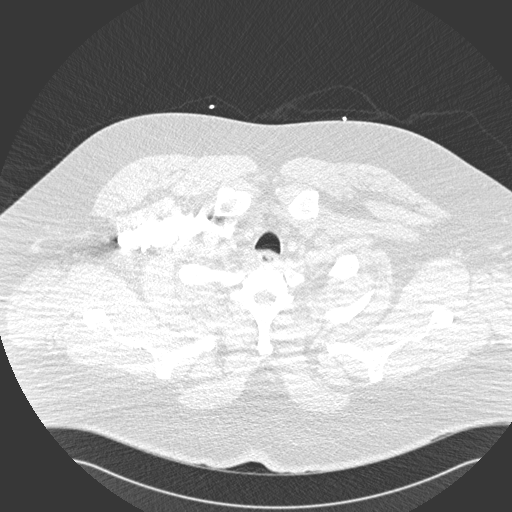

[17 of 31 positions shown; findings below may reference images not displayed]

FINDINGS: CTA CHEST FINDINGS

No evidence of pulmonary embolism.

Mediastinum/Nodes: Cardiomegaly.  No pericardial effusion.

Atherosclerotic calcifications of the aortic arch.

Thoracic lymphadenopathy, including:

--11 mm short axis node at the left thoracic inlet (series 6/image
18)

--15 mm short axis high right paratracheal node (series 6/ image 36)

--16 mm short axis AP window node, partially calcified (series 6/
image 46)

--9 mm short axis low right paratracheal node (series 6/ image 48)

--22 mm short axis right azygoesophageal recess node (series 6/image
72)

Visualized thyroid is unremarkable.

Lungs/Pleura: Wedge-shaped ground-glass opacity at the right lung
apex (series 10/ image 22), favored to reflect right apical pleural
parenchymal scarring.

Interlobular septal thickening with ground-glass opacity/mosaic
attenuation, favored to reflect mild interstitial edema.

Mild subpleural reticulation. Mild patchy opacities at the lung
bases, likely atelectasis. No pleural effusions.

No pneumothorax.

Musculoskeletal: Degenerative changes of the thoracic spine.
Exaggerated lower thoracic kyphosis.

CT ABDOMEN PELVIS FINDINGS

Hepatobiliary: Heterogeneous perfusion of the liver.

Gallbladder is unremarkable. No intrahepatic or extrahepatic ductal
dilatation.

Pancreas: Within normal limits.

Spleen: Within normal limits.

Adrenals/Urinary Tract: 2.5 cm right adrenal nodule (series 5/ image
30) with greater than 40% relative washout, compatible with a benign
adrenal adenoma. Left adrenal gland is within normal limits.

Two hypoenhancing lesions in the low right lower kidney measuring
1.5 and 1.1 cm (series 9/ images 39 and 42), possibly reflecting
small cysts but technically indeterminate.

Left kidney is within normal limits.  No hydronephrosis.

Bladder is underdistended but unremarkable.

Stomach/Bowel: Stomach is within normal limits.

No evidence of bowel obstruction.

Normal appendix (series 5/image 72).

Vascular/Lymphatic: No evidence of abdominal aortic aneurysm.

Small left para-aortic nodes which do not meet pathologic CT size
criteria.

Reproductive: Prostate is unremarkable.

Other: No abdominopelvic ascites.

Small fat containing right inguinal hernia. Fat within the left
inguinal canal.

Musculoskeletal: Degenerative changes of the visualized
thoracolumbar spine.

Review of the MIP images confirms the above findings.
IMPRESSION: No evidence pulmonary embolism.

Suspected early/mild interstitial edema, although without pleural
effusions.

Mild thoracic lymphadenopathy, including partially calcified nodes,
possibly sequela of prior granulomatous disease.

No evidence of bowel obstruction.  Normal appendix.

2.5 cm benign right adrenal adenoma.

Additional ancillary findings as above.

## 2017-05-13 ENCOUNTER — Inpatient Hospital Stay
Admission: EM | Admit: 2017-05-13 | Discharge: 2017-05-15 | DRG: 309 | Disposition: A | Discharge status: Home / Self Care | Payer: BLUE CROSS/BLUE SHIELD | Attending: Internal Medicine | Admitting: Internal Medicine

## 2017-05-13 ENCOUNTER — Other Ambulatory Visit: Payer: Self-pay

## 2017-05-13 ENCOUNTER — Emergency Department: Payer: BLUE CROSS/BLUE SHIELD

## 2017-05-13 ENCOUNTER — Encounter: Payer: Self-pay | Admitting: Emergency Medicine

## 2017-05-13 DIAGNOSIS — F419 Anxiety disorder, unspecified: Secondary | ICD-10-CM | POA: Diagnosis present

## 2017-05-13 DIAGNOSIS — G8929 Other chronic pain: Secondary | ICD-10-CM | POA: Diagnosis present

## 2017-05-13 DIAGNOSIS — Z833 Family history of diabetes mellitus: Secondary | ICD-10-CM | POA: Diagnosis not present

## 2017-05-13 DIAGNOSIS — Z87891 Personal history of nicotine dependence: Secondary | ICD-10-CM

## 2017-05-13 DIAGNOSIS — I10 Essential (primary) hypertension: Secondary | ICD-10-CM | POA: Diagnosis present

## 2017-05-13 DIAGNOSIS — Z7901 Long term (current) use of anticoagulants: Secondary | ICD-10-CM | POA: Diagnosis not present

## 2017-05-13 DIAGNOSIS — Z6841 Body Mass Index (BMI) 40.0 and over, adult: Secondary | ICD-10-CM

## 2017-05-13 DIAGNOSIS — R079 Chest pain, unspecified: Secondary | ICD-10-CM | POA: Diagnosis present

## 2017-05-13 DIAGNOSIS — I4891 Unspecified atrial fibrillation: Secondary | ICD-10-CM

## 2017-05-13 DIAGNOSIS — I482 Chronic atrial fibrillation: Secondary | ICD-10-CM | POA: Diagnosis present

## 2017-05-13 DIAGNOSIS — Z7982 Long term (current) use of aspirin: Secondary | ICD-10-CM | POA: Diagnosis not present

## 2017-05-13 DIAGNOSIS — Z79899 Other long term (current) drug therapy: Secondary | ICD-10-CM | POA: Diagnosis not present

## 2017-05-13 DIAGNOSIS — Z952 Presence of prosthetic heart valve: Secondary | ICD-10-CM

## 2017-05-13 DIAGNOSIS — Z8249 Family history of ischemic heart disease and other diseases of the circulatory system: Secondary | ICD-10-CM | POA: Diagnosis not present

## 2017-05-13 DIAGNOSIS — F329 Major depressive disorder, single episode, unspecified: Secondary | ICD-10-CM | POA: Diagnosis present

## 2017-05-13 DIAGNOSIS — I480 Paroxysmal atrial fibrillation: Principal | ICD-10-CM | POA: Diagnosis present

## 2017-05-13 DIAGNOSIS — E119 Type 2 diabetes mellitus without complications: Secondary | ICD-10-CM | POA: Diagnosis present

## 2017-05-13 LAB — CBC
HCT: 34.4 % — ABNORMAL LOW (ref 40.0–52.0)
Hemoglobin: 11.3 g/dL — ABNORMAL LOW (ref 13.0–18.0)
MCH: 26.8 pg (ref 26.0–34.0)
MCHC: 32.8 g/dL (ref 32.0–36.0)
MCV: 81.8 fL (ref 80.0–100.0)
Platelets: 312 K/uL (ref 150–440)
RBC: 4.21 MIL/uL — ABNORMAL LOW (ref 4.40–5.90)
RDW: 16.7 % — ABNORMAL HIGH (ref 11.5–14.5)
WBC: 10.3 K/uL (ref 3.8–10.6)

## 2017-05-13 LAB — COMPREHENSIVE METABOLIC PANEL WITH GFR
ALT: 20 U/L (ref 17–63)
AST: 36 U/L (ref 15–41)
Albumin: 3.6 g/dL (ref 3.5–5.0)
Alkaline Phosphatase: 46 U/L (ref 38–126)
Anion gap: 9 (ref 5–15)
BUN: 15 mg/dL (ref 6–20)
CO2: 22 mmol/L (ref 22–32)
Calcium: 9.6 mg/dL (ref 8.9–10.3)
Chloride: 97 mmol/L — ABNORMAL LOW (ref 101–111)
Creatinine, Ser: 0.74 mg/dL (ref 0.61–1.24)
GFR calc Af Amer: >60 mL/min (ref >60)
GFR calc non Af Amer: >60 mL/min (ref >60)
Glucose, Bld: 134 mg/dL — ABNORMAL HIGH (ref 65–99)
Potassium: 4.3 mmol/L (ref 3.5–5.1)
Sodium: 128 mmol/L — ABNORMAL LOW (ref 135–145)
Total Bilirubin: 0.7 mg/dL (ref 0.3–1.2)
Total Protein: 6.8 g/dL (ref 6.5–8.1)

## 2017-05-13 LAB — PROTIME-INR
INR: 2.17
Prothrombin Time: 24 seconds — ABNORMAL HIGH (ref 11.4–15.2)

## 2017-05-13 LAB — BRAIN NATRIURETIC PEPTIDE: B NATRIURETIC PEPTIDE 5: 95 pg/mL (ref 0.0–100.0)

## 2017-05-13 LAB — MAGNESIUM: Magnesium: 1 mg/dL — ABNORMAL LOW (ref 1.7–2.4)

## 2017-05-13 LAB — TROPONIN I: Troponin I: <0.03 ng/mL (ref <0.03)

## 2017-05-13 LAB — TSH: TSH: 0.839 u[IU]/mL (ref 0.350–4.500)

## 2017-05-13 MED ORDER — WARFARIN SODIUM 4 MG PO TABS: 4.0000 mg | ORAL_TABLET | Freq: Every evening | ORAL | Status: DC

## 2017-05-13 MED ORDER — AMIODARONE HCL IN DEXTROSE 360-4.14 MG/200ML-% IV SOLN: 60.0000 mg/h | INTRAVENOUS | Status: DC

## 2017-05-13 MED ORDER — VITAMIN B-6 100 MG PO TABS
100.0000 mg | ORAL_TABLET | Freq: Every day | ORAL | Status: DC
  Administered 2017-05-14 – 2017-05-15 (×2): 100 mg via ORAL
  Filled 2017-05-13 (×2): qty 1

## 2017-05-13 MED ORDER — DILTIAZEM HCL 100 MG IV SOLR
5.0000 mg/h | Freq: Once | INTRAVENOUS | Status: AC
  Administered 2017-05-13: 5 mg/h via INTRAVENOUS
  Filled 2017-05-13: qty 100

## 2017-05-13 MED ORDER — METOPROLOL SUCCINATE ER 50 MG PO TB24
50.0000 mg | ORAL_TABLET | Freq: Every day | ORAL | Status: DC
  Administered 2017-05-14 – 2017-05-15 (×2): 50 mg via ORAL
  Filled 2017-05-13 (×2): qty 1

## 2017-05-13 MED ORDER — ONDANSETRON HCL 4 MG/2ML IJ SOLN
4.0000 mg | Freq: Four times a day (QID) | INTRAMUSCULAR | Status: DC | PRN
  Administered 2017-05-14 – 2017-05-15 (×4): 4 mg via INTRAVENOUS
  Filled 2017-05-13 (×4): qty 2

## 2017-05-13 MED ORDER — DILTIAZEM HCL 25 MG/5ML IV SOLN
15.0000 mg | Freq: Once | INTRAVENOUS | Status: AC
  Administered 2017-05-13: 15 mg via INTRAVENOUS
  Filled 2017-05-13: qty 5

## 2017-05-13 MED ORDER — WARFARIN SODIUM 5 MG PO TABS
5.0000 mg | ORAL_TABLET | ORAL | Status: DC
  Administered 2017-05-13: 5 mg via ORAL
  Filled 2017-05-13: qty 1

## 2017-05-13 MED ORDER — CHLORTHALIDONE 25 MG PO TABS
25.0000 mg | ORAL_TABLET | Freq: Every evening | ORAL | Status: DC
  Administered 2017-05-13 – 2017-05-14 (×2): 25 mg via ORAL
  Filled 2017-05-13 (×2): qty 1

## 2017-05-13 MED ORDER — ONDANSETRON HCL 4 MG PO TABS: 4.0000 mg | ORAL_TABLET | Freq: Four times a day (QID) | ORAL | Status: DC | PRN

## 2017-05-13 MED ORDER — ACETAMINOPHEN 650 MG RE SUPP: 650.0000 mg | Freq: Four times a day (QID) | RECTAL | Status: DC | PRN

## 2017-05-13 MED ORDER — ASPIRIN EC 81 MG PO TBEC
81.0000 mg | DELAYED_RELEASE_TABLET | Freq: Every day | ORAL | Status: DC
  Administered 2017-05-14 – 2017-05-15 (×2): 81 mg via ORAL
  Filled 2017-05-13 (×2): qty 1

## 2017-05-13 MED ORDER — ESCITALOPRAM OXALATE 5 MG PO TABS
5.0000 mg | ORAL_TABLET | Freq: Every evening | ORAL | Status: DC
  Administered 2017-05-13 – 2017-05-14 (×2): 5 mg via ORAL
  Filled 2017-05-13 (×2): qty 1

## 2017-05-13 MED ORDER — SODIUM CHLORIDE 0.9 % IV BOLUS (SEPSIS)
250.0000 mL | Freq: Once | INTRAVENOUS | Status: AC
  Administered 2017-05-13: 250 mL via INTRAVENOUS

## 2017-05-13 MED ORDER — PIOGLITAZONE HCL 15 MG PO TABS
45.0000 mg | ORAL_TABLET | Freq: Every day | ORAL | Status: DC
  Administered 2017-05-14 – 2017-05-15 (×2): 45 mg via ORAL
  Filled 2017-05-13 (×2): qty 3

## 2017-05-13 MED ORDER — VITAMIN B-12 1000 MCG PO TABS
1000.0000 ug | ORAL_TABLET | Freq: Every day | ORAL | Status: DC
  Administered 2017-05-14 – 2017-05-15 (×2): 1000 ug via ORAL
  Filled 2017-05-13 (×2): qty 1

## 2017-05-13 MED ORDER — DILTIAZEM HCL 30 MG PO TABS
30.0000 mg | ORAL_TABLET | Freq: Four times a day (QID) | ORAL | Status: DC
  Administered 2017-05-14 (×3): 30 mg via ORAL
  Filled 2017-05-13 (×4): qty 1

## 2017-05-13 MED ORDER — ACETAMINOPHEN 325 MG PO TABS
650.0000 mg | ORAL_TABLET | Freq: Four times a day (QID) | ORAL | Status: DC | PRN
Start: 2017-05-13 — End: 2017-05-15

## 2017-05-13 MED ORDER — AMIODARONE HCL IN DEXTROSE 360-4.14 MG/200ML-% IV SOLN
60.0000 mg/h | INTRAVENOUS | Status: DC
  Filled 2017-05-13 (×2): qty 200

## 2017-05-13 MED ORDER — LORAZEPAM 1 MG PO TABS
1.0000 mg | ORAL_TABLET | Freq: Two times a day (BID) | ORAL | Status: DC
  Administered 2017-05-13 – 2017-05-15 (×4): 1 mg via ORAL
  Filled 2017-05-13 (×4): qty 1

## 2017-05-13 MED ORDER — AMIODARONE HCL IN DEXTROSE 360-4.14 MG/200ML-% IV SOLN
30.0000 mg/h | INTRAVENOUS | Status: DC
  Administered 2017-05-14 – 2017-05-15 (×3): 30 mg/h via INTRAVENOUS
  Filled 2017-05-13 (×4): qty 200

## 2017-05-13 MED ORDER — WARFARIN SODIUM 4 MG PO TABS
4.0000 mg | ORAL_TABLET | ORAL | Status: DC
  Administered 2017-05-14: 4 mg via ORAL
  Filled 2017-05-13: qty 1

## 2017-05-13 MED ORDER — ACETAMINOPHEN 325 MG PO TABS: 650.0000 mg | ORAL_TABLET | ORAL | Status: DC | PRN

## 2017-05-13 MED ORDER — LISINOPRIL 20 MG PO TABS
20.0000 mg | ORAL_TABLET | Freq: Every day | ORAL | Status: DC
  Administered 2017-05-14 – 2017-05-15 (×2): 20 mg via ORAL
  Filled 2017-05-13: qty 2
  Filled 2017-05-13: qty 1

## 2017-05-13 MED ORDER — LORAZEPAM 1 MG PO TABS: 1.0000 mg | ORAL_TABLET | Freq: Two times a day (BID) | ORAL | Status: DC

## 2017-05-13 MED ORDER — AMIODARONE LOAD VIA INFUSION
150.0000 mg | Freq: Once | INTRAVENOUS | Status: DC
  Filled 2017-05-13: qty 83.34

## 2017-05-13 MED ORDER — WARFARIN - PHARMACIST DOSING INPATIENT
Freq: Every day | Status: DC
  Administered 2017-05-13: 18:00:00

## 2017-05-13 MED ORDER — TRAMADOL HCL 50 MG PO TABS: 50.0000 mg | ORAL_TABLET | Freq: Four times a day (QID) | ORAL | Status: DC | PRN

## 2017-05-13 MED ORDER — SODIUM CHLORIDE 0.9 % IV BOLUS (SEPSIS)
500.0000 mL | Freq: Once | INTRAVENOUS | Status: AC
Start: 2017-05-13 — End: 2017-05-13
  Administered 2017-05-13: 500 mL via INTRAVENOUS

## 2017-05-13 MED ORDER — DILTIAZEM HCL 25 MG/5ML IV SOLN
15.0000 mg | Freq: Once | INTRAVENOUS | Status: AC
  Administered 2017-05-13: 15 mg via INTRAVENOUS
  Filled 2017-05-13 (×3): qty 5

## 2017-05-13 MED ORDER — AMIODARONE HCL IN DEXTROSE 360-4.14 MG/200ML-% IV SOLN
30.0000 mg/h | INTRAVENOUS | Status: AC
Start: 2017-05-13 — End: 2017-05-13
  Administered 2017-05-13: 30 mg/h via INTRAVENOUS

## 2017-05-13 MED ORDER — DOCUSATE SODIUM 100 MG PO CAPS: 100.0000 mg | ORAL_CAPSULE | Freq: Two times a day (BID) | ORAL | Status: DC | PRN

## 2017-05-13 MED ORDER — ONDANSETRON HCL 4 MG/2ML IJ SOLN
4.0000 mg | Freq: Once | INTRAMUSCULAR | Status: AC
  Administered 2017-05-13: 4 mg via INTRAVENOUS
  Filled 2017-05-13: qty 2

## 2017-05-13 MED ORDER — FERROUS SULFATE 220 (44 FE) MG/5ML PO ELIX
71.0000 mg | ORAL_SOLUTION | Freq: Every day | ORAL | Status: DC
  Administered 2017-05-13 – 2017-05-15 (×3): 71 mg via ORAL
  Filled 2017-05-13 (×3): qty 1.7

## 2017-05-13 MED ORDER — CYCLOBENZAPRINE HCL 10 MG PO TABS: 5.0000 mg | ORAL_TABLET | Freq: Three times a day (TID) | ORAL | Status: DC | PRN

## 2017-05-13 MED ORDER — MAGNESIUM SULFATE 2 GM/50ML IV SOLN
2.0000 g | Freq: Once | INTRAVENOUS | Status: AC
  Administered 2017-05-13: 2 g via INTRAVENOUS
  Filled 2017-05-13: qty 50

## 2017-05-13 MED ORDER — AMIODARONE HCL IN DEXTROSE 360-4.14 MG/200ML-% IV SOLN: 30.0000 mg/h | INTRAVENOUS | Status: DC

## 2017-05-13 MED ORDER — LORAZEPAM 2 MG/ML IJ SOLN
1.0000 mg | Freq: Once | INTRAMUSCULAR | Status: AC
  Administered 2017-05-13: 1 mg via INTRAVENOUS
  Filled 2017-05-13: qty 1

## 2017-05-13 NOTE — ED Triage Notes (Signed)
Chest pain began 3 days ago. States has hx a fib but not usually in a fib.

## 2017-05-13 NOTE — ED Notes (Signed)
Per dr. Posey Pronto, start mag first; then amiodarone

## 2017-05-13 NOTE — Progress Notes (Addendum)
Patient's blood pressure before starting amiodarone infusion was 71/49. After rechecking it was still low, 75/49. MD paged and notified. Verbal order to give 250 sodium chloride 0.9% bolus received. Will administer and continue to monitor patient. Cardiology paged.    Update 1651: Cardiology called with new verbal orders. Per MD, give 250 sodium chloride 0.9% bolus and start the amiodarone infusion at a rate of 16.45ml/hr. Will continue to monitor patient's blood pressure. Patient asymptomatic with no complaints.

## 2017-05-13 NOTE — ED Provider Notes (Addendum)
[facility] Emergency Department Provider Note  ____________________________________________   I have reviewed the triage vital signs and the nursing notes. Where available I have reviewed prior notes and, if possible and indicated, outside hospital notes.    HISTORY  Chief Complaint Chest Pain     HPI Russell Ray is a 47 y.o. male presents today complaining of A. fib with RVR.  Patient knows when he is in atrial fibrillation, he can feel it.  He does have a history of a valve replacement with a mechanical valve.  Paroxysmal atrial fib he has had to be cardioverted in the past.  He denies having chest pain but he is very anxious he states that he has tingling in his fingers and sometimes his arms hurt.  Patient has been in A. fib with RVR he thinks since Friday night.  This is Monday morning.  He denies any fever or chills he is not short of breath.  He states he is under life stress he does drink a lot of caffeine and sugar drinks.  In any event, patient noted that he was in A. fib starting Friday and has been in it ever since.  Mostly, he can hear the clicking from his mechanical valve as it accelerates in his chest.  He is on Coumadin and compliant with it.  He is not having chest pain right now but he states he has had various very poorly described discomfort mostly in his arms and tingling in his hands since this all started and is not sure if that is from anxiety or from the actual disease process.  He states he has been very anxious.  He would like something for his nerves, he feels slightly nauseated.  He was hoping to "power through this" but it has been persisting long enough that he feels the need, and.  Patient states he is compliant with his medications at home he does take metoprolol according to notes.  Patient denies any other acute symptoms.     Past Medical History:  Diagnosis Date  . Anxiety   . Aortic valve stenosis   . Atrial fibrillation ([place])   .  CHF (congestive heart failure) ([place])   . Depression   . Diabetes mellitus without complication ([place])   . Hypertension     Patient Active Problem List   Diagnosis Date Noted  . Ventral hernia with bowel obstruction 12/01/2016  . Atrial fibrillation ([place]) 05/17/2015  . Elevated troponin 05/05/2015  . Obesity 05/05/2015  . Obstructive sleep apnea 05/05/2015  . Hyponatremia 05/05/2015  . Leukocytosis 05/05/2015  . Anemia 05/05/2015  . Anticoagulated on Coumadin 05/05/2015  . H/O aortic valve replacement 05/05/2015  . A-fib ([place]) 05/03/2015  . Atrial fibrillation with RVR ([place]) 05/03/2015  . Anxiety 04/23/2015  . CHF (congestive heart failure) ([place]) 04/23/2015  . Essential (primary) hypertension 04/23/2015  . Type 2 diabetes mellitus without complication ([place]) 74/25/9563  . Chest pain, central 04/22/2015  . Morbid obesity with BMI of 50.0-59.9, adult ([place]) 04/22/2015  . Morbid obesity ([place]) 03/19/2015  . Leg swelling 03/19/2015  . Orthopnea 03/19/2015  . Tobacco abuse 03/19/2015  . 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 APPLICATION OF WOUND VAC    . CARDIAC CATHETERIZATION Right 04/22/2015   Procedure: Right/Left Heart Cath and Coronary Angiography;  Surgeon: Dionisio David, MD;  Location: [facility];  Service: Cardiovascular;  Laterality: Right;  .  CARDIAC VALVE REPLACEMENT    . COLONOSCOPY WITH PROPOFOL N/A 10/05/2016   Procedure: COLONOSCOPY WITH PROPOFOL;  Surgeon: Manya Silvas, MD;  Location: [facility] ENDOSCOPY;  Service: Endoscopy;  Laterality: N/A;  . debridement sternum    . ELECTROPHYSIOLOGIC STUDY N/A 05/05/2015   Procedure: Cardioversion;  Surgeon: Dionisio David, MD;  Location: [facility] ORS;  Service: Cardiovascular;  Laterality: N/A;  . ELECTROPHYSIOLOGIC STUDY N/A 05/17/2015   Procedure: CARDIOVERSION;  Surgeon: Dionisio David, MD;  Location: [facility] ORS;  Service: Cardiovascular;   Laterality: N/A;  . ESOPHAGOGASTRODUODENOSCOPY (EGD) WITH PROPOFOL N/A 10/05/2016   Procedure: ESOPHAGOGASTRODUODENOSCOPY (EGD) WITH PROPOFOL;  Surgeon: Manya Silvas, MD;  Location[facility] ENDOSCOPY;  Service: Endoscopy;  Laterality: N/A;  . GREATER OMENTAL FLAP CLOSURE    . HERNIA REPAIR     X3  . VALVE REPLACEMENT    . VENTRAL HERNIA REPAIR N/A 12/01/2016   Procedure: HERNIA REPAIR VENTRAL ADULT with mesh;  Surgeon: Jules Husbands, MD;  Location: [facility] ORS;  Service: General;  Laterality: N/A;    Prior to Admission medications   Medication Sig Start Date End Date Taking? Authorizing Provider  acetaminophen (TYLENOL) 325 MG tablet Take 650 mg by mouth every 4 (four) hours as needed for moderate pain.    [provider]  amoxicillin-clavulanate (AUGMENTIN) 875-125 MG tablet Take 1 tablet by mouth 2 (two) times daily. 12/10/16   Clayburn Pert, MD  aspirin EC 81 MG tablet Take 81 mg by mouth daily.     [provider]  chlorthalidone (HYGROTON) 25 MG tablet Take 25 mg by mouth daily.    [provider]  cyanocobalamin 1000 MCG tablet Take 1,000 mcg by mouth daily.    [provider]  cyclobenzaprine (FLEXERIL) 5 MG tablet Take 5 mg by mouth 3 (three) times daily as needed.    [provider]  docusate sodium (COLACE) 100 MG capsule Take 1 capsule (100 mg total) by mouth 2 (two) times daily. 12/03/16 12/03/17  Vickie Epley, MD  escitalopram (LEXAPRO) 10 MG tablet Take 5 mg by mouth at bedtime.  11/26/16   [provider]  furosemide (LASIX) 40 MG tablet Take 1 tablet (40 mg total) by mouth daily. 03/19/15   Theodoro Grist, MD  lisinopril (PRINIVIL,ZESTRIL) 20 MG tablet Take 20 mg by mouth daily. 11/03/16   [provider]  LORazepam (ATIVAN) 1 MG tablet Take 1 mg by mouth 2 (two) times daily.  03/11/15   [provider]  metoprolol succinate (TOPROL-XL) 25 MG 24 hr tablet Take 25 mg by mouth daily.    [provider]  ondansetron (ZOFRAN) 4 MG tablet Take 4 mg by mouth every 6 (six) hours as needed. 12/14/16   [provider]  oxyCODONE (OXY IR/ROXICODONE) 5 MG immediate release tablet Take 1 tablet (5 mg total) by mouth every 6 (six) hours as needed for moderate pain. 12/20/16   [name], [place], MD  pioglitazone (ACTOS) 45 MG tablet Take 45 mg by mouth daily.    [provider]  polyethylene glycol (MIRALAX / GLYCOLAX) packet Take 17 g by mouth daily as needed.    [provider]  senna-docusate (SENOKOT-S) 8.6-50 MG tablet Take 1 tablet by mouth at bedtime as needed for mild constipation. 03/19/15   Theodoro Grist, MD  warfarin (COUMADIN) 2 MG tablet Take 3 tablets (6 mg total) by mouth at bedtime. Patient taking differently: Take 4 mg by mouth at bedtime. Patient taking 4 mg T/TH/Sat/Sun  5 mg MWF 05/05/15   Theodoro Grist, MD    Allergies Patient has no known allergies.  Family History  Problem Relation Age of Onset  . Hypertension Mother   . Hypertension Maternal Grandmother   . Diabetes Maternal Grandmother     Social History Social History   Tobacco Use  . Smoking status: Former Smoker    Packs/day: 2.00    Years: 28.00    Pack years: 56.00    Types: Cigarettes  . Smokeless tobacco: Never Used  Substance Use Topics  . Alcohol use: No  . Drug use: No    Review of Systems Constitutional: No fever/chills Eyes: No visual changes. ENT: No sore throat. No stiff neck no neck pain Cardiovascular: Denies chest pain. Respiratory: Denies shortness of breath. Gastrointestinal:   no vomiting.  No diarrhea.  No constipation. Genitourinary: Negative for dysuria. Musculoskeletal: Negative lower extremity swelling Skin: Negative for rash. Neurological: Negative for severe headaches, focal weakness or numbness.   ____________________________________________   PHYSICAL EXAM:  VITAL SIGNS: ED Triage Vitals  Enc Vitals Group     BP 05/13/17 0728 97/63     Pulse  Rate 05/13/17 0728 91     Resp 05/13/17 0728 (!) 24     Temp 05/13/17 0728 98 F (36.7 C)     Temp src --      SpO2 05/13/17 0728 97 %     Weight 05/13/17 0730 (!) 425 lb (192.8 kg)     Height 05/13/17 0730 6' (1.829 m)     Head Circumference --      Peak Flow --      Pain Score 05/13/17 0730 7     Pain Loc --      Pain Edu? --      Excl. in [place]? --     Constitutional: Alert and oriented. Well appearing and in no acute distress. Eyes: Conjunctivae are normal Head: Atraumatic HEENT: No congestion/rhinnorhea. Mucous membranes are moist.  Oropharynx non-erythematous Neck:   Nontender with no meningismus, no masses, no stridor Cardiovascular: Irregularly irregular, rapid heart rate in 120s 1 in the room, mechanical click appreciated.  Good peripheral circulation. Respiratory: Normal respiratory effort.  No retractions. Lungs CTAB. Abdominal: Soft and nontender. No distention. No guarding no rebound Back:  There is no focal tenderness or step off.  there is no midline tenderness there are no lesions noted. there is no CVA tenderness Musculoskeletal: No lower extremity tenderness, no upper extremity tenderness. No joint effusions, no DVT signs strong distal pulses no edema Neurologic:  Normal speech and language. No gross focal neurologic deficits are appreciated.  Skin:  Skin is warm, dry and intact. No rash noted. Psychiatric: Mood and affect are quite anxious. Speech and behavior are normal.  ____________________________________________   LABS (all labs ordered are listed, but only abnormal results are displayed)  Labs Reviewed  CBC  TROPONIN I  COMPREHENSIVE METABOLIC PANEL  TSH  MAGNESIUM  PROTIME-INR    Pertinent labs  results that were available during my care of the patient were reviewed by me and considered in my medical decision making (see chart for details). ____________________________________________  EKG  I personally interpreted any EKGs ordered by me or  triage Atrial fibrillation with rapid ventricular response rate 138 no acute ST elevation or depression normal axis no acute ischemia ____________________________________________  RADIOLOGY  Pertinent labs & imaging results that were available during my care of the patient were reviewed by me and considered in my medical decision  making (see chart for details). If possible, patient and/or family made aware of any abnormal findings.  No results found. ____________________________________________    PROCEDURES  Procedure(s) performed: None  Procedures  Critical Care performed: CRITICAL CARE Performed by: Schuyler Amor   Total critical care time: 45  minutes  Critical care time was exclusive of separately billable procedures and treating other patients.  Critical care was necessary to treat or prevent imminent or life-threatening deterioration.  Critical care was time spent personally by me on the following activities: development of treatment plan with patient and/or surrogate as well as nursing, discussions with consultants, evaluation of patient's response to treatment, examination of patient, obtaining history from patient or surrogate, ordering and performing treatments and interventions, ordering and review of laboratory studies, ordering and review of radiographic studies, pulse oximetry and re-evaluation of patient's condition.   ____________________________________________   INITIAL IMPRESSION / ASSESSMENT AND PLAN / ED COURSE  Pertinent labs & imaging results that were available during my care of the patient were reviewed by me and considered in my medical decision making (see chart for details).  Gentleman here with A. fib with RVR, heart rate goes up into the 034V certainly compounded by some degree of anxiety which she freely admits to.  He does have a history of paroxysmal atrial fib in the past.  He has some nonspecific discomfort in his arms and tinglings in his  hands which she attributes either to the atrial fibrillation to being anxious about the atrial fib.  No significant chest pain no acute EKG changes suggesting acute ischemia patient been in this rhythm by his report since Friday.  3 days.  We will treat him with rate controlling medications antianxiety medications were given nausea medication will check cardiac enzymes BNP chest x-ray, we will give him IV fluids and in a ginger manner, and we will reassess his Coumadin level etc. BP is in the 120s at this time.  ----------------------------------------- 9:24 AM on 05/13/2017 -----------------------------------------  Comfortable with no discomfort or tingling or other concerns, heart rate is in the mid 90s mostly, still in atrial fibrillation rate is well controlled we will admit     Personally looked at x-ray.  Blood work pending still ____________________________________________   FINAL CLINICAL IMPRESSION(S) / ED DIAGNOSES  Final diagnoses:  None      This chart was dictated using voice recognition software.  Despite best efforts to proofread,  errors can occur which can change meaning.      Schuyler Amor, MD 05/13/17 4259    Schuyler Amor, MD 05/13/17 5638    Schuyler Amor, MD 05/13/17 7564    Schuyler Amor, MD 05/13/17 3329    Schuyler Amor, MD 05/13/17 5188    Schuyler Amor, MD 05/13/17 [phone]

## 2017-05-13 NOTE — Progress Notes (Signed)
ANTICOAGULATION CONSULT NOTE - Initial Consult  Pharmacy Consult for Warfarin Indication: Mechanical Aortic Valve - metal  No Known Allergies  Patient Measurements: Height: 6' (182.9 cm) Weight: (!) 425 lb (192.8 kg) IBW/kg (Calculated) : 77.6 Heparin Dosing Weight:    Vital Signs: Temp: 98 F (36.7 C) (03/18 0728) BP: 97/63 (03/18 0728) Pulse Rate: 91 (03/18 0728)  Labs: Recent Labs    05/13/17 0838 05/13/17 0839  HGB 11.3*  --   HCT 34.4*  --   PLT 312  --   LABPROT  --  24.0*  INR  --  2.17  CREATININE 0.74  --   TROPONINI <0.03  --     Estimated Creatinine Clearance: 204 mL/min (by C-G formula based on SCr of 0.74 mg/dL).   Medical History: Past Medical History:  Diagnosis Date  . Anxiety   . Aortic valve stenosis   . Atrial fibrillation ([place])   . CHF (congestive heart failure) ([place])   . Depression   . Diabetes mellitus without complication ([place])   . Hypertension     Assessment: Patient is a 47yo male admitted for afib. Patient is s/p aortic valve replacement.  Home dose of Warfarin 4mg  Tuesday, Thursday, Saturday, Sunday 5mg  Monday, Wednesday, Friday  3/18   INR 2.17  Goal of Therapy:  INR 2.5 - 3.5    Plan:  Will continue with home dose for now. Patient has been started on Amiodarone drip so will need to watch INR closely for increase. Daily INR checks.  Paulina Fusi, PharmD, BCPS 05/13/2017 12:35 PM

## 2017-05-13 NOTE — H&P (Signed)
[place] [facility] [place] NAME: Russell Ray    MR#:  [id number]  DATE OF BIRTH:  [birth year]  DATE OF ADMISSION:  05/13/2017  PRIMARY CARE PHYSICIAN: [name], MD   REQUESTING/REFERRING PHYSICIAN: Dr. Corky Downs  CHIEF COMPLAINT:   Feeling funny, palpitation, chest pain and dizziness HISTORY OF PRESENT ILLNESS:  Russell Ray  is a 47 y.o. male with a known history of severe aortic valve stenosis status post T AVR with mechanical valve on chronic anticoagulation with Coumadin done in 2017, morbid obesity, diabetes, hypertension, history of atrial fibrillation comes to the emergency room with palpitations, chest tightness funny feeling with dizziness and lightheadedness.  Patient was found to be in A. fib with RVR heart rate was in the 130s.  Currently on diltiazem drip of 5 mg/h heart rate is in the 90s blood pressure is stable. Patient is being admitted for further evaluation and management for his acute on chronic atrial fibrillation  PAST MEDICAL HISTORY:   Past Medical History:  Diagnosis Date  . Anxiety   . Aortic valve stenosis   . Atrial fibrillation ([place])   . CHF (congestive heart failure) ([place])   . Depression   . Diabetes mellitus without complication ([address])   . Hypertension     PAST SURGICAL HISTOIRY:   Past Surgical History:  Procedure Laterality Date  . APPLICATION OF WOUND VAC    . CARDIAC CATHETERIZATION Right 04/22/2015   Procedure: Right/Left Heart Cath and Coronary Angiography;  Surgeon: Dionisio David, MD;  Location: [facility];  Service: Cardiovascular;  Laterality: Right;  . CARDIAC VALVE REPLACEMENT    . COLONOSCOPY WITH PROPOFOL N/A 10/05/2016   Procedure: COLONOSCOPY WITH PROPOFOL;  Surgeon: Manya Silvas, MD;  Location: [facility] ENDOSCOPY;  Service: Endoscopy;  Laterality: N/A;  . debridement sternum    . ELECTROPHYSIOLOGIC STUDY N/A 05/05/2015   Procedure: Cardioversion;  Surgeon: Dionisio David,  MD;  Location: [facility] ORS;  Service: Cardiovascular;  Laterality: N/A;  . ELECTROPHYSIOLOGIC STUDY N/A 05/17/2015   Procedure: CARDIOVERSION;  Surgeon: Dionisio David, MD;  Location: [facility] ORS;  Service: Cardiovascular;  Laterality: N/A;  . ESOPHAGOGASTRODUODENOSCOPY (EGD) WITH PROPOFOL N/A 10/05/2016   Procedure: ESOPHAGOGASTRODUODENOSCOPY (EGD) WITH PROPOFOL;  Surgeon: Manya Silvas, MD;  Location: [facility] ENDOSCOPY;  Service: Endoscopy;  Laterality: N/A;  . GREATER OMENTAL FLAP CLOSURE    . HERNIA REPAIR     X3  . VALVE REPLACEMENT    . VENTRAL HERNIA REPAIR N/A 12/01/2016   Procedure: HERNIA REPAIR VENTRAL ADULT with mesh;  Surgeon: Jules Husbands, MD;  Location: [facility] ORS;  Service: General;  Laterality: N/A;    SOCIAL HISTORY:   Social History   Tobacco Use  . Smoking status: Former Smoker    Packs/day: 2.00    Years: 28.00    Pack years: 56.00    Types: Cigarettes  . Smokeless tobacco: Never Used  Substance Use Topics  . Alcohol use: No    FAMILY HISTORY:   Family History  Problem Relation Age of Onset  . Hypertension Mother   . Hypertension Maternal Grandmother   . Diabetes Maternal Grandmother     DRUG ALLERGIES:  No Known Allergies  REVIEW OF SYSTEMS:  Review of Systems  Constitutional: Positive for malaise/fatigue. Negative for chills, fever and weight loss.  HENT: Negative for ear discharge, ear pain and nosebleeds.   Eyes: Negative for blurred vision, pain and discharge.  Respiratory: Negative for sputum  production, shortness of breath, wheezing and stridor.   Cardiovascular: Positive for chest pain and palpitations. Negative for orthopnea and PND.  Gastrointestinal: Negative for abdominal pain, diarrhea, nausea and vomiting.  Genitourinary: Negative for frequency and urgency.  Musculoskeletal: Negative for back pain and joint pain.  Neurological: Positive for weakness. Negative for sensory change, speech change and focal weakness.  Psychiatric/Behavioral:  Negative for depression and hallucinations. The patient is not nervous/anxious.      MEDICATIONS AT HOME:   Prior to Admission medications   Medication Sig Start Date End Date Taking? Authorizing Provider  acetaminophen (TYLENOL) 325 MG tablet Take 650 mg by mouth every 4 (four) hours as needed for moderate pain.   Yes [provider]  aspirin EC 81 MG tablet Take 81 mg by mouth daily.    Yes [provider]  chlorthalidone (HYGROTON) 25 MG tablet Take 25 mg by mouth every evening.    Yes [provider]  cyanocobalamin 1000 MCG tablet Take 1,000 mcg by mouth daily.   Yes [provider]  cyclobenzaprine (FLEXERIL) 5 MG tablet Take 5 mg by mouth 3 (three) times daily as needed for muscle spasms.    Yes [provider]  docusate sodium (COLACE) 100 MG capsule Take 1 capsule (100 mg total) by mouth 2 (two) times daily. Patient taking differently: Take 100 mg by mouth 2 (two) times daily as needed for mild constipation or moderate constipation.  12/03/16 12/03/17 Yes Vickie Epley, MD  escitalopram (LEXAPRO) 10 MG tablet Take 5 mg by mouth every evening.    Yes [provider]  Iron, Ferrous Sulfate, 142 (45 Fe) MG TBCR Take 0.5 tablets by mouth daily.   Yes [provider]  lisinopril (PRINIVIL,ZESTRIL) 20 MG tablet Take 20 mg by mouth daily.   Yes [provider]  LORazepam (ATIVAN) 1 MG tablet Take 1 mg by mouth 2 (two) times daily.    Yes [provider]  metoprolol succinate (TOPROL-XL) 50 MG 24 hr tablet Take 50 mg by mouth daily. Take with or immediately following a meal.   Yes [provider]  ondansetron (ZOFRAN) 4 MG tablet Take 4 mg by mouth every 6 (six) hours as needed for nausea or vomiting.    Yes [provider]  pioglitazone (ACTOS) 45 MG tablet Take 45 mg by mouth daily.   Yes [provider]  pyridOXINE (VITAMIN B-6) 100 MG tablet Take 100 mg by mouth daily.   Yes  [provider]  warfarin (COUMADIN) 2 MG tablet Take 3 tablets (6 mg total) by mouth at bedtime. Patient taking differently: Take 4 mg by mouth every evening.  05/05/15  Yes Theodoro Grist, MD      VITAL SIGNS:  Blood pressure 97/63, pulse 91, temperature 98 F (36.7 C), resp. rate (!) 24, height 6' (1.829 m), weight (!) 192.8 kg (425 lb), SpO2 97 %.  PHYSICAL EXAMINATION:  GENERAL:  47 y.o.-year-old patient lying in the bed with no acute distress.  Morbid obesity EYES: Pupils equal, round, reactive to light and accommodation. No scleral icterus. Extraocular muscles intact.  HEENT: Head atraumatic, normocephalic. Oropharynx and nasopharynx clear.  NECK:  Supple, no jugular venous distention. No thyroid enlargement, no tenderness.  LUNGS: Normal breath sounds bilaterally, no wheezing, rales,rhonchi or crepitation. No use of accessory muscles of respiration.  CARDIOVASCULAR: S1, S2 normal. No murmurs, rubs, or gallops.  Irregularly irregular heart rhythm ABDOMEN: Soft, nontender, nondistended. Bowel sounds present. No organomegaly or mass.  EXTREMITIES: No pedal edema, cyanosis, or clubbing.  NEUROLOGIC: Cranial nerves II through XII are intact. Muscle strength 5/5 in all extremities. Sensation intact. Gait not checked.  PSYCHIATRIC: The patient is alert and oriented x 3.  SKIN: No obvious rash, lesion, or ulcer.   LABORATORY PANEL:   CBC Recent Labs  Lab 05/13/17 0838  WBC 10.3  HGB 11.3*  HCT 34.4*  PLT 312   ------------------------------------------------------------------------------------------------------------------  Chemistries  Recent Labs  Lab 05/13/17 0838 05/13/17 0839  NA 128*  --   K 4.3  --   CL 97*  --   CO2 22  --   GLUCOSE 134*  --   BUN 15  --   CREATININE 0.74  --   CALCIUM 9.6  --   MG  --  1.0*  AST 36  --   ALT 20  --   ALKPHOS 46  --   BILITOT 0.7  --     ------------------------------------------------------------------------------------------------------------------  Cardiac Enzymes Recent Labs  Lab 05/13/17 0838  TROPONINI <0.03   ------------------------------------------------------------------------------------------------------------------  RADIOLOGY:  Dg Chest Port 1 View  Result Date: 05/13/2017 CLINICAL DATA:  Onset of left-sided chest pain 2 nights ago. History of previous cardiac valve replacement, CHF, atrial fibrillation. EXAM: PORTABLE CHEST 1 VIEW COMPARISON:  Chest x-ray of May 17, 2015 FINDINGS: The lungs are adequately inflated. There is no focal infiltrate. There is no pleural effusion. The cardiac silhouette is top-normal in size. The pulmonary vascularity is not clearly engorged. The trachea is midline. There is no pleural effusion. IMPRESSION: There is no pneumonia, pulmonary edema, nor other acute cardiopulmonary abnormality. Electronically Signed   By: David  Martinique M.D.   On: 05/13/2017 08:54    EKG:   A. fib with RVR.  Presented with heart rate of 138.  Now in the 90s IMPRESSION AND PLAN:   Bijon Mineer  is a 47 y.o. male with a known history of severe aortic valve stenosis status post T AVR with mechanical valve on chronic anticoagulation with Coumadin done in 2017, morbid obesity, diabetes, hypertension, history of atrial fibrillation comes to the emergency room with palpitations, chest tightness funny feeling with dizziness and lightheadedness.  Patient was found to be in A. fib with RVR heart rate was in the 130s.  1.  A. fib acute on chronic with RVR -Admit patient to telemetry floor Came in with heart rate in the 130s.  Started on Cardizem drip.  Heart rate is in the 90s.- -Patient is on Coumadin with INR of 2.17.  We will continue current home dosing.  Pharmacy to dose. -Spoke with Dr. Yancey Flemings patient's cardiologist.  Recommends amiodarone loading and infusion.  Will wean off Cardizem  2.   Hypomagnesemia -mag Of 1.0 -Replete IV and oral  3.  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  Hypertension continue home meds  5.  Diabetes type 2 Continue home meds and sliding scale insulin  6.  Full code  7.  DVT prophylaxis already on Coumadin with therapeutic INR  All the records are reviewed and case discussed with ED provider. Management plans discussed with the patient, family and they are in agreement.  CODE STATUS: Full  TOTAL MEDICAL TIME TAKING CARE OF THIS PATIENT: *50* minutes.    Fritzi Mandes M.D on 05/13/2017 at 11:21 AM  Between 7am to 6pm - Pager - [phone]  After  6pm go to [email or web address] - password [email or web address] [facility]  [facility]  Office  [phone]  CC: Primary care physician; [name], MD

## 2017-05-13 NOTE — Progress Notes (Signed)
Patients blood pressure readings better: 138/85, 112/63. Heart rate 90-140s. Sustaining more in the 120s. MD paged to notify of patients status. Verbal orders received to administer 15mg  Cardizem IV once and after 6 hours 30mg  Cardizem PO Q6. This information relayed to night nurse. Nursing will continue to monitor.

## 2017-05-14 ENCOUNTER — Inpatient Hospital Stay: Payer: BLUE CROSS/BLUE SHIELD | Admitting: Anesthesiology

## 2017-05-14 ENCOUNTER — Encounter: Payer: Self-pay | Admitting: Anesthesiology

## 2017-05-14 ENCOUNTER — Encounter
Admission: EM | Disposition: A | Discharge status: Home / Self Care | Payer: Self-pay | Source: Home / Self Care | Attending: Internal Medicine

## 2017-05-14 HISTORY — PX: CARDIOVERSION: EP1203

## 2017-05-14 LAB — BASIC METABOLIC PANEL
Anion gap: 9 (ref 5–15)
BUN: 16 mg/dL (ref 6–20)
CHLORIDE: 98 mmol/L — AB (ref 101–111)
CO2: 24 mmol/L (ref 22–32)
CREATININE: 0.79 mg/dL (ref 0.61–1.24)
Calcium: 9.4 mg/dL (ref 8.9–10.3)
GFR calc Af Amer: >60 mL/min (ref >60)
GFR calc non Af Amer: >60 mL/min (ref >60)
Glucose, Bld: 131 mg/dL — ABNORMAL HIGH (ref 65–99)
Potassium: 3.9 mmol/L (ref 3.5–5.1)
Sodium: 131 mmol/L — ABNORMAL LOW (ref 135–145)

## 2017-05-14 LAB — PROTIME-INR
INR: 2.26
INR: 2.12
PROTHROMBIN TIME: 23.6 s — AB (ref 11.4–15.2)
Prothrombin Time: 24.8 seconds — ABNORMAL HIGH (ref 11.4–15.2)

## 2017-05-14 LAB — MAGNESIUM: Magnesium: 1.2 mg/dL — ABNORMAL LOW (ref 1.7–2.4)

## 2017-05-14 LAB — GLUCOSE, CAPILLARY: GLUCOSE-CAPILLARY: 120 mg/dL — AB (ref 65–99)

## 2017-05-14 SURGERY — CARDIOVERSION (CATH LAB)
Anesthesia: General

## 2017-05-14 MED ORDER — PROPOFOL 10 MG/ML IV BOLUS
INTRAVENOUS | Status: DC | PRN
  Administered 2017-05-14: 40 mg via INTRAVENOUS
  Administered 2017-05-14: 100 mg via INTRAVENOUS
  Administered 2017-05-14: 40 mg via INTRAVENOUS
  Administered 2017-05-14: 20 mg via INTRAVENOUS

## 2017-05-14 MED ORDER — MAGNESIUM SULFATE 4 GM/100ML IV SOLN
4.0000 g | Freq: Once | INTRAVENOUS | Status: AC
  Administered 2017-05-14: 4 g via INTRAVENOUS
  Filled 2017-05-14: qty 100

## 2017-05-14 MED ORDER — BUTAMBEN-TETRACAINE-BENZOCAINE 2-2-14 % EX AERO
INHALATION_SPRAY | CUTANEOUS | Status: AC
  Filled 2017-05-14: qty 5

## 2017-05-14 MED ORDER — SODIUM CHLORIDE 0.9 % IV SOLN
INTRAVENOUS | Status: DC | PRN
  Administered 2017-05-14: 14:00:00 via INTRAVENOUS

## 2017-05-14 MED ORDER — DIGOXIN 0.25 MG/ML IJ SOLN
0.5000 mg | Freq: Once | INTRAMUSCULAR | Status: AC
Start: 2017-05-14 — End: 2017-05-14
  Administered 2017-05-14: 0.5 mg via INTRAVENOUS
  Filled 2017-05-14: qty 2

## 2017-05-14 MED ORDER — PROPOFOL 10 MG/ML IV BOLUS
INTRAVENOUS | Status: AC
  Filled 2017-05-14: qty 40

## 2017-05-14 MED ORDER — LIDOCAINE VISCOUS 2 % MT SOLN
OROMUCOSAL | Status: AC
  Filled 2017-05-14: qty 15

## 2017-05-14 NOTE — Transfer of Care (Signed)
Immediate Anesthesia Transfer of Care Note  Patient: Russell Ray  Procedure(s) Performed: CARDIOVERSION (N/A )  Patient Location: PACU and SPU  Anesthesia Type:General  Level of Consciousness: awake, alert  and oriented  Airway & Oxygen Therapy: Patient Spontanous Breathing and Patient connected to nasal cannula oxygen  Post-op Assessment: Report given to RN and Post -op Vital signs reviewed and stable  Post vital signs: Reviewed and stable  Last Vitals:  Vitals:   05/14/17 1353 05/14/17 1354  BP:  (!) 109/48  Pulse: 82 82  Resp: 15 14  Temp:    SpO2: 98% 98%    Last Pain:  Vitals:   05/14/17 1300  TempSrc: Oral  PainSc:          Complications: No apparent anesthesia complications

## 2017-05-14 NOTE — Consult Note (Signed)
[place] Nurse wound consult note Reason for Consult: Left inner thigh "boils" Patient states the left inner thigh area has been there for years.  Patient states throughout this time he has personally lanced it, drained the pus, cleaned it with hydrogen peroxide and put dressings on it.  Today it does not have any drainage, erythema, warmth, or other signs of infection; there is a gauze dressing to the site. Plan:  Cleanse area with soap and water during normal daily hygiene.  Apply a foam dressing to the area.  Change daily.   Additional suggestions provided to the patient was to speak to his provider about getting the area evaluated to determine if it needs surgical intervention to get the area to heal.  We discussed the possibility that this area may represent a recurrent cyst that would need the capsule removed to heal completely.  The patient indicated he would be willing to discuss this with his primary care provider. Thank you for the consult.  Discussed plan of care with the patient and bedside nurse.  [place] nurse will not follow at this time.  Please re-consult the [place] team if needed.  Val Riles, RN, MSN, CWOCN, CNS-BC, pager [phone]

## 2017-05-14 NOTE — Progress Notes (Signed)
  NAME:  Kevron Patella   MRN: [id number] DOB:  February 21, 1971   ADMIT DATE: 05/13/2017  Procedure: Electrical Cardioversion Indications:  Atrial Fibrillation  Procedure Details:    Time Out: Verified patient identification, verified procedure, site/side was marked, verified correct patient position, special equipment/implants available, medications/allergies/relevent history reviewed, required imaging and test results available.    Patient placed on cardiac monitor, pulse oximetry, supplemental oxygen as necessary.  Sedation given:  Pacer pads placed   Cardioverted to NSR.  Cardioverted at 200j  Evaluation: Findings: Post procedure EKG shows:nsr 225J Complications: none Patient did well.     Dionisio David, M.D. [facility]   05/14/2017 2:31 PM

## 2017-05-14 NOTE — Consult Note (Signed)
Russell Ray is a 47 y.o. male  [id number]  Primary Cardiologist: Neoma Laming Reason for Consultation: chest pain and atrial fibrillation  HPI: is a 47 year old morbidly obese male came in with chest pain and palpitation and was found to be in atrial fibrillation with rapid ventricular response rate. 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   Review of Systems: no further chest pain.   Past Medical History:  Diagnosis Date  . Anxiety   . Aortic valve stenosis   . Atrial fibrillation ([place])   . CHF (congestive heart failure) ([place])   . Depression   . Diabetes mellitus without complication ([place])   . Hypertension     Medications Prior to Admission  Medication Sig Dispense Refill  . acetaminophen (TYLENOL) 325 MG tablet Take 650 mg by mouth every 4 (four) hours as needed for moderate pain.    Marland Kitchen aspirin EC 81 MG tablet Take 81 mg by mouth daily.     . chlorthalidone (HYGROTON) 25 MG tablet Take 25 mg by mouth every evening.     . cyanocobalamin 1000 MCG tablet Take 1,000 mcg by mouth daily.    . cyclobenzaprine (FLEXERIL) 5 MG tablet Take 5 mg by mouth 3 (three) times daily as needed for muscle spasms.     Marland Kitchen docusate sodium (COLACE) 100 MG capsule Take 1 capsule (100 mg total) by mouth 2 (two) times daily. (Patient taking differently: Take 100 mg by mouth 2 (two) times daily as needed for mild constipation or moderate constipation. ) 60 capsule 2  . escitalopram (LEXAPRO) 10 MG tablet Take 5 mg by mouth every evening.   2  . Iron, Ferrous Sulfate, 142 (45 Fe) MG TBCR Take 0.5 tablets by mouth daily.    Marland Kitchen lisinopril (PRINIVIL,ZESTRIL) 20 MG tablet Take 20 mg by mouth daily.  0  . LORazepam (ATIVAN) 1 MG tablet Take 1 mg by mouth 2 (two) times daily.   2  . metoprolol succinate (TOPROL-XL) 50 MG 24 hr tablet Take 50 mg by mouth daily. Take with or immediately following a meal.    . ondansetron  (ZOFRAN) 4 MG tablet Take 4 mg by mouth every 6 (six) hours as needed for nausea or vomiting.   0  . pioglitazone (ACTOS) 45 MG tablet Take 45 mg by mouth daily.    Marland Kitchen pyridOXINE (VITAMIN B-6) 100 MG tablet Take 100 mg by mouth daily.    Marland Kitchen warfarin (COUMADIN) 2 MG tablet Take 3 tablets (6 mg total) by mouth at bedtime. (Patient taking differently: Take 4 mg by mouth every evening. ) 90 tablet 5     . aspirin EC  81 mg Oral Daily  . chlorthalidone  25 mg Oral QPM  . diltiazem  30 mg Oral Q6H  . escitalopram  5 mg Oral QPM  . ferrous sulfate  71 mg Oral Daily  . lisinopril  20 mg Oral Daily  . LORazepam  1 mg Oral BID  . metoprolol succinate  50 mg Oral Daily  . pioglitazone  45 mg Oral Daily  . pyridOXINE  100 mg Oral Daily  . cyanocobalamin  1,000 mcg Oral Daily  . warfarin  4 mg Oral Q T,Th,S,Su-1800  . warfarin  5 mg Oral Q M,W,F-1800  . Warfarin - Pharmacist Dosing Inpatient   Does not apply q1800    Infusions: . amiodarone 30 mg/hr (05/14/17 0456)    No Known  Allergies  Social History   Socioeconomic History  . Marital status: Married    Spouse name: Not on file  . Number of children: Not on file  . Years of education: Not on file  . Highest education level: Not on file  Social Needs  . Financial resource strain: Not on file  . Food insecurity - worry: Not on file  . Food insecurity - inability: Not on file  . Transportation needs - medical: Not on file  . Transportation needs - non-medical: Not on file  Occupational History  . Occupation: Arboriculturist  Tobacco Use  . Smoking status: Former Smoker    Packs/day: 2.00    Years: 28.00    Pack years: 56.00    Types: Cigarettes  . Smokeless tobacco: Never Used  Substance and Sexual Activity  . Alcohol use: No  . Drug use: No  . Sexual activity: Yes  Other Topics Concern  . Not on file  Social History Narrative  . Not on file    Family History  Problem Relation Age of Onset  . Hypertension Mother   .  Hypertension Maternal Grandmother   . Diabetes Maternal Grandmother     PHYSICAL EXAM: Vitals:   05/14/17 0452 05/14/17 0759  BP: (!) 101/59 (!) 155/81  Pulse: 63 65  Resp: 19 18  Temp: (!) 97.5 F (36.4 C) 97.6 F (36.4 C)  SpO2: 98% 100%     Intake/Output Summary (Last 24 hours) at 05/14/2017 0843 Last data filed at 05/13/2017 1856 Gross per 24 hour  Intake 760 ml  Output -  Net 760 ml    General:  Well appearing. No respiratory difficulty HEENT: normal Neck: supple. no JVD. Carotids 2+ bilat; no bruits. No lymphadenopathy or thryomegaly appreciated. Cor: PMI nondisplaced. Regular rate & rhythm. No rubs, gallops or murmurs. Lungs: clear Abdomen: soft, nontender, nondistended. No hepatosplenomegaly. No bruits or masses. Good bowel sounds. Extremities: no cyanosis, clubbing, rash, edema Neuro: alert & oriented x 3, cranial nerves grossly intact. moves all 4 extremities w/o difficulty. Affect pleasant.  ECG: atrial fibrillation with rapid ventricular response rate and on monitor showing ventricular rate of 147.  Results for orders placed or performed during the hospital encounter of 05/13/17 (from the past 24 hour(s))  Protime-INR     Status: Abnormal   Collection Time: 05/14/17  4:40 AM  Result Value Ref Range   Prothrombin Time 24.8 (H) 11.4 - 15.2 seconds   INR 0.86   Basic metabolic panel     Status: Abnormal   Collection Time: 05/14/17  4:40 AM  Result Value Ref Range   Sodium 131 (L) 135 - 145 mmol/L   Potassium 3.9 3.5 - 5.1 mmol/L   Chloride 98 (L) 101 - 111 mmol/L   CO2 24 22 - 32 mmol/L   Glucose, Bld 131 (H) 65 - 99 mg/dL   BUN 16 6 - 20 mg/dL   Creatinine, Ser 0.79 0.61 - 1.24 mg/dL   Calcium 9.4 8.9 - 10.3 mg/dL   GFR calc non Af Amer >60 >60 mL/min   GFR calc Af Amer >60 >60 mL/min   Anion gap 9 5 - 15  Magnesium     Status: Abnormal   Collection Time: 05/14/17  4:40 AM  Result Value Ref Range   Magnesium 1.2 (L) 1.7 - 2.4 mg/dL   Dg Chest Port  1 View  Result Date: 05/13/2017 CLINICAL DATA:  Onset of left-sided chest pain 2 nights ago. History of previous  cardiac valve replacement, CHF, atrial fibrillation. EXAM: PORTABLE CHEST 1 VIEW COMPARISON:  Chest x-ray of May 17, 2015 FINDINGS: The lungs are adequately inflated. There is no focal infiltrate. There is no pleural effusion. The cardiac silhouette is top-normal in size. The pulmonary vascularity is not clearly engorged. The trachea is midline. There is no pleural effusion. IMPRESSION: There is no pneumonia, pulmonary edema, nor other acute cardiopulmonary abnormality. Electronically Signed   By: David  Martinique M.D.   On: 05/13/2017 08:54     ASSESSMENT AND PLAN:H established with rapid ventricular response rate not responding to metoprolol Cardizem and IV amiodarone will add digoxin and do electrical cardioversion if possible today.  KHAN,SHAUKAT A

## 2017-05-14 NOTE — Anesthesia Post-op Follow-up Note (Signed)
Anesthesia QCDR form completed.        

## 2017-05-14 NOTE — Progress Notes (Signed)
Cardioversion successful. 12 lead EKG done at bedside: NSR. Report called to Danae Chen, RN on floor. Pt. AA&Ox3 at present, conversive with clear speech. No acute distress.

## 2017-05-14 NOTE — Progress Notes (Signed)
Patient ID: Russell Ray, male   DOB: [birth year], 47 y.o.   MRN: [id number]  [facility] PROGRESS NOTE  Russell Ray GHW:[id number] DOB: 1970-03-08 DOA: 05/13/2017 PCP: [name], MD  HPI/Subjective: Patient seen earlier.  He felt himself go into A. fib on Friday.  Was in rapid atrial fibrillation.  Just had successful cardioversion.  Objective: Vitals:   05/14/17 1430 05/14/17 1449  BP: 107/60 132/73  Pulse: 80 90  Resp:    Temp:  98.2 F (36.8 C)  SpO2: 95% 97%    Filed Weights   05/13/17 0730 05/13/17 1308 05/14/17 1300  Weight: (!) 192.8 kg (425 lb) (!) 193.8 kg (427 lb 4.8 oz) (!) 193.7 kg (427 lb)    ROS: Review of Systems  Constitutional: Negative for chills and fever.  Eyes: Negative for blurred vision.  Respiratory: Negative for cough and shortness of breath.   Cardiovascular: Positive for palpitations. Negative for chest pain.  Gastrointestinal: Negative for abdominal pain, constipation, diarrhea, nausea and vomiting.  Genitourinary: Negative for dysuria.  Musculoskeletal: Negative for joint pain.  Neurological: Negative for dizziness and headaches.   Exam: Physical Exam  Constitutional: He is oriented to person, place, and time.  HENT:  Nose: No mucosal edema.  Mouth/Throat: No oropharyngeal exudate or posterior oropharyngeal edema.  Eyes: Conjunctivae, EOM and lids are normal. Pupils are equal, round, and reactive to light.  Neck: No JVD present. Carotid bruit is not present. No edema present. No thyroid mass and no thyromegaly present.  Cardiovascular: S1 normal and S2 normal. Exam reveals no gallop.  No murmur heard. Pulses:      Dorsalis pedis pulses are 2+ on the right side, and 2+ on the left side.  Respiratory: No respiratory distress. He has decreased breath sounds in the right lower field and the left lower field. He has no wheezes. He has no rhonchi. He has no rales.  GI: Soft. Bowel sounds are normal. There is no tenderness.   Musculoskeletal:       Right ankle: He exhibits swelling.       Left ankle: He exhibits swelling.  Lymphadenopathy:    He has no cervical adenopathy.  Neurological: He is alert and oriented to person, place, and time. No cranial nerve deficit.  Skin: Skin is warm. No rash noted. Nails show no clubbing.  Psychiatric: He has a normal mood and affect.      Data Reviewed: Basic Metabolic Panel: Recent Labs  Lab 05/13/17 0838 05/13/17 0839 05/14/17 0440  NA 128*  --  131*  K 4.3  --  3.9  CL 97*  --  98*  CO2 22  --  24  GLUCOSE 134*  --  131*  BUN 15  --  16  CREATININE 0.74  --  0.79  CALCIUM 9.6  --  9.4  MG  --  1.0* 1.2*   Liver Function Tests: Recent Labs  Lab 05/13/17 0838  AST 36  ALT 20  ALKPHOS 46  BILITOT 0.7  PROT 6.8  ALBUMIN 3.6   CBC: Recent Labs  Lab 05/13/17 0838  WBC 10.3  HGB 11.3*  HCT 34.4*  MCV 81.8  PLT 312   Cardiac Enzymes: Recent Labs  Lab 05/13/17 0838  TROPONINI <0.03   BNP (last 3 results) Recent Labs    05/13/17 0839  BNP 95.0     CBG: Recent Labs  Lab 05/14/17 1129  GLUCAP 120*     Studies: Dg Chest [facility] 1 View  Result  Date: 05/13/2017 CLINICAL DATA:  Onset of left-sided chest pain 2 nights ago. History of previous cardiac valve replacement, CHF, atrial fibrillation. EXAM: PORTABLE CHEST 1 VIEW COMPARISON:  Chest x-ray of May 17, 2015 FINDINGS: The lungs are adequately inflated. There is no focal infiltrate. There is no pleural effusion. The cardiac silhouette is top-normal in size. The pulmonary vascularity is not clearly engorged. The trachea is midline. There is no pleural effusion. IMPRESSION: There is no pneumonia, pulmonary edema, nor other acute cardiopulmonary abnormality. Electronically Signed   By: David  Martinique M.D.   On: 05/13/2017 08:54    Scheduled Meds: . aspirin EC  81 mg Oral Daily  . chlorthalidone  25 mg Oral QPM  . diltiazem  30 mg Oral Q6H  . escitalopram  5 mg Oral QPM  . ferrous  sulfate  71 mg Oral Daily  . lisinopril  20 mg Oral Daily  . LORazepam  1 mg Oral BID  . metoprolol succinate  50 mg Oral Daily  . pioglitazone  45 mg Oral Daily  . pyridOXINE  100 mg Oral Daily  . cyanocobalamin  1,000 mcg Oral Daily  . warfarin  4 mg Oral Q T,Th,S,Su-1800  . warfarin  5 mg Oral Q M,W,F-1800  . Warfarin - Pharmacist Dosing Inpatient   Does not apply q1800   Continuous Infusions: . amiodarone 30 mg/hr (05/14/17 0456)    Assessment/Plan:  1. Atrial fibrillation with rapid ventricular rate.  Status post cardioversion.  As per Dr. Chancy Milroy continue amiodarone drip overnight and switch to oral amiodarone tomorrow.  Coumadin level therapeutic. 2. Hypomagnesemia. IV magnesium 4 g given IV today 3. 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 Essential hypertension on chlorthalidone and metoprolol and lisinopril 5. Morbid obesity weight loss needed 6. Type 2 diabetes mellitus on pioglitazone 7. Depression on Lexapro  Code Status:     Code Status Orders  (From admission, onward)        Start     Ordered   05/13/17 1319  Full code  Continuous     05/13/17 1318    Code Status History    Date Active Date Inactive Code Status Order ID Comments User Context   12/02/2016 00:32 12/03/2016 14:36 Full Code [id number]  Jules Husbands, MD Inpatient   05/17/2015 09:57 05/17/2015 18:28 Full Code [id number]  Bettey Costa, MD ED   05/04/2015 09:38 05/05/2015 20:39 Full Code [id number]  Daune Perch, NP Inpatient   05/03/2015 17:34 05/04/2015 09:38 Full Code [id number]  Theodoro Grist, MD ED   04/22/2015 07:37 04/22/2015 21:30 Full Code [id number]  Lytle Butte, MD ED   03/18/2015 14:14 03/19/2015 15:15 Full Code [id number]  Fritzi Mandes, MD Inpatient      Disposition Plan: Home tomorrow  Consultants:  Cardiology  Time spent: 28 minutes  Russell Ray

## 2017-05-14 NOTE — Anesthesia Preprocedure Evaluation (Signed)
Anesthesia Evaluation  Patient identified by MRN, date of birth, ID band Patient awake    Reviewed: Allergy & Precautions, NPO status , Patient's Chart, lab work & pertinent test results, reviewed documented beta blocker date and time   History of Anesthesia Complications Negative for: history of anesthetic complications  Airway Mallampati: III  TM Distance: >3 FB     Dental  (+) Chipped   Pulmonary shortness of breath (and with new a fib) and with exertion, sleep apnea and Continuous Positive Airway Pressure Ventilation , neg COPD, neg recent URI, former smoker,    Pulmonary exam normal        Cardiovascular hypertension, Pt. on medications and Pt. on home beta blockers (-) angina+CHF and + Orthopnea  (-) CAD, (-) Past MI, (-) Cardiac Stents and (-) CABG + dysrhythmias Atrial Fibrillation + Valvular Problems/Murmurs AS  Rhythm:Irregular     Neuro/Psych PSYCHIATRIC DISORDERS Anxiety Depression negative neurological ROS     GI/Hepatic negative GI ROS, Neg liver ROS,   Endo/Other  diabetes, Well Controlled, Type 2Morbid obesity  Renal/GU negative Renal ROS  negative genitourinary   Musculoskeletal negative musculoskeletal ROS (+)   Abdominal (+) + obese,   Peds negative pediatric ROS (+)  Hematology  (+) anemia ,   Anesthesia Other Findings Past Medical History: No date: Anxiety No date: Aortic valve stenosis No date: Atrial fibrillation (HCC) No date: CHF (congestive heart failure) (HCC) No date: Depression No date: Diabetes mellitus without complication (HCC) No date: Hypertension   Reproductive/Obstetrics                             Anesthesia Physical  Anesthesia Plan  ASA: III  Anesthesia Plan: General   Post-op Pain Management:    Induction: Intravenous  PONV Risk Score and Plan: 2 and Propofol infusion  Airway Management Planned: Natural Airway and Nasal  Cannula  Additional Equipment:   Intra-op Plan:   Post-operative Plan:   Informed Consent: I have reviewed the patients History and Physical, chart, labs and discussed the procedure including the risks, benefits and alternatives for the proposed anesthesia with the patient or authorized representative who has indicated his/her understanding and acceptance.   Dental advisory given  Plan Discussed with: CRNA and Surgeon  Anesthesia Plan Comments:         Anesthesia Quick Evaluation

## 2017-05-14 NOTE — Progress Notes (Addendum)
Pt back from cardioversion, pt in sinus rhythm. Amiodarone gtt going at 16.8ml/hr. Will switch to PO amio in am per Dr. Laurelyn Sickle note. VSS, no distress noted. Will continue to monitor.

## 2017-05-14 NOTE — Progress Notes (Signed)
ANTICOAGULATION CONSULT NOTE - Initial Consult  Pharmacy Consult for Warfarin Indication: Mechanical Aortic Valve - metal  No Known Allergies  Patient Measurements: Height: 6' (182.9 cm) Weight: (!) 427 lb 4.8 oz (193.8 kg) IBW/kg (Calculated) : 77.6 Heparin Dosing Weight:    Vital Signs: Temp: 97.6 F (36.4 C) (03/19 0759) Temp Source: Oral (03/19 0759) BP: 155/81 (03/19 0759) Pulse Rate: 65 (03/19 0759)  Labs: Recent Labs    05/13/17 0838 05/13/17 0839 05/14/17 0440  HGB 11.3*  --   --   HCT 34.4*  --   --   PLT 312  --   --   LABPROT  --  24.0* 24.8*  INR  --  2.17 2.26  CREATININE 0.74  --  0.79  TROPONINI <0.03  --   --     Estimated Creatinine Clearance: 204.7 mL/min (by C-G formula based on SCr of 0.79 mg/dL).   Medical History: Past Medical History:  Diagnosis Date  . Anxiety   . Aortic valve stenosis   . Atrial fibrillation ([place])   . CHF (congestive heart failure) ([place])   . Depression   . Diabetes mellitus without complication ([place])   . Hypertension     Assessment: Patient is a 47yo male admitted for afib. Patient is s/p aortic valve replacement.  Home dose of Warfarin 4mg  Tuesday, Thursday, Saturday, Sunday 5mg  Monday, Wednesday, Friday  3/18 INR 2.17  Warfarin 5mg   3/19 INR 2.26   Goal of Therapy:  INR 2.5 - 3.5    Plan:  Will continue with home dose for now. Patient has been started on Amiodarone drip so will need to watch INR closely for increase. Daily INR checks.  Thomasenia Sales, PharmD, MBA, [facility]    05/14/2017 8:35 AM

## 2017-05-14 NOTE — Progress Notes (Signed)
Advise continuing IV amiodrone overnight and change to po 400 bid in AM and can go home on amiodrone and metoprolol 50 mg daily.

## 2017-05-15 ENCOUNTER — Encounter: Payer: Self-pay | Admitting: Cardiovascular Disease

## 2017-05-15 LAB — BASIC METABOLIC PANEL
Anion gap: 10 (ref 5–15)
BUN: 13 mg/dL (ref 6–20)
CHLORIDE: 95 mmol/L — AB (ref 101–111)
CO2: 23 mmol/L (ref 22–32)
Calcium: 10 mg/dL (ref 8.9–10.3)
Creatinine, Ser: 0.77 mg/dL (ref 0.61–1.24)
GFR calc Af Amer: >60 mL/min (ref >60)
GFR calc non Af Amer: >60 mL/min (ref >60)
GLUCOSE: 131 mg/dL — AB (ref 65–99)
POTASSIUM: 3.9 mmol/L (ref 3.5–5.1)
Sodium: 128 mmol/L — ABNORMAL LOW (ref 135–145)

## 2017-05-15 LAB — MAGNESIUM: MAGNESIUM: 1.2 mg/dL — AB (ref 1.7–2.4)

## 2017-05-15 LAB — PROTIME-INR
INR: 2.12
Prothrombin Time: 23.6 seconds — ABNORMAL HIGH (ref 11.4–15.2)

## 2017-05-15 MED ORDER — MAGNESIUM SULFATE 4 GM/100ML IV SOLN
4.0000 g | Freq: Once | INTRAVENOUS | Status: AC
  Administered 2017-05-15: 4 g via INTRAVENOUS
  Filled 2017-05-15: qty 100

## 2017-05-15 MED ORDER — MAGNESIUM OXIDE -MG SUPPLEMENT 400 (240 MG) MG PO TABS: 400.0000 mg | ORAL_TABLET | Freq: Every morning | ORAL | 0 refills | Status: DC

## 2017-05-15 MED ORDER — WARFARIN SODIUM 2 MG PO TABS: 4.0000 mg | ORAL_TABLET | Freq: Every evening | ORAL | Status: DC

## 2017-05-15 MED ORDER — WARFARIN SODIUM 6 MG PO TABS: 6.0000 mg | ORAL_TABLET | Freq: Every day | ORAL | Status: DC

## 2017-05-15 MED ORDER — AMIODARONE HCL 200 MG PO TABS
400.0000 mg | ORAL_TABLET | Freq: Two times a day (BID) | ORAL | Status: DC
  Administered 2017-05-15: 400 mg via ORAL
  Filled 2017-05-15: qty 2

## 2017-05-15 MED ORDER — AMIODARONE HCL 200 MG PO TABS: ORAL_TABLET | ORAL | 0 refills | Status: DC

## 2017-05-15 NOTE — Progress Notes (Signed)
SUBJECTIVE: Feeling well. No chest discomfort.   Vitals:   05/14/17 1833 05/14/17 2000 05/15/17 0334 05/15/17 0839  BP: (!) 118/56 118/60 (!) 103/53 (!) 145/65  Pulse: 88 84 82 82  Resp: 18   18  Temp:  97.8 F (36.6 C) (!) 97.4 F (36.3 C)   TempSrc:  Oral Oral   SpO2: 97% 98% 97% 98%  Weight:      Height:        Intake/Output Summary (Last 24 hours) at 05/15/2017 0921 Last data filed at 05/14/2017 1346 Gross per 24 hour  Intake 646.8 ml  Output -  Net 646.8 ml    LABS: Basic Metabolic Panel: Recent Labs    05/14/17 0440 05/15/17 0755  NA 131* 128*  K 3.9 3.9  CL 98* 95*  CO2 24 23  GLUCOSE 131* 131*  BUN 16 13  CREATININE 0.79 0.77  CALCIUM 9.4 10.0  MG 1.2* 1.2*   Liver Function Tests: Recent Labs    05/13/17 0838  AST 36  ALT 20  ALKPHOS 46  BILITOT 0.7  PROT 6.8  ALBUMIN 3.6   No results for input(s): LIPASE, AMYLASE in the last 72 hours. CBC: Recent Labs    05/13/17 0838  WBC 10.3  HGB 11.3*  HCT 34.4*  MCV 81.8  PLT 312   Cardiac Enzymes: Recent Labs    05/13/17 0838  TROPONINI <0.03   BNP: Invalid input(s): POCBNP D-Dimer: No results for input(s): DDIMER in the last 72 hours. Hemoglobin A1C: No results for input(s): HGBA1C in the last 72 hours. Fasting Lipid Panel: No results for input(s): CHOL, HDL, LDLCALC, TRIG, CHOLHDL, LDLDIRECT in the last 72 hours. Thyroid Function Tests: Recent Labs    05/13/17 0838  TSH 0.839   Anemia Panel: No results for input(s): VITAMINB12, FOLATE, FERRITIN, TIBC, IRON, RETICCTPCT in the last 72 hours.   PHYSICAL EXAM General: Well developed, well nourished, in no acute distress HEENT:  Normocephalic and atramatic Neck:  No JVD.  Lungs: Clear bilaterally to auscultation and percussion. Heart: HRRR . Normal S1 and S2 without gallops or murmurs.  Abdomen: Bowel sounds are positive, abdomen soft and non-tender  Msk:  Back normal, normal gait. Normal strength and tone for age. Extremities:  No clubbing, cyanosis or edema.   Neuro: Alert and oriented X 3. Psych:  Good affect, responds appropriately  TELEMETRY: NSR 81bpm  ASSESSMENT AND PLAN: Atrial fibrillation status post successful cardioversion. Sinus rhythm restored. May discharge on oral amiodarone 400mg  BID and follow up Friday 10am with [facility].   Active Problems:   A-fib [facility])    Jake Bathe, NP-C 05/15/2017 9:21 AM Cell: [phone]

## 2017-05-15 NOTE — Progress Notes (Signed)
Patient ID: Russell Ray,  [place] at [place] was admitted to the Hospital on 05/13/2017 and Discharged  05/15/2017 and should be excused from work/school   for 6  days starting 05/13/2017 , may return to work/school without any restrictions.  Loletha Grayer M.D on 05/15/2017,at 9:03 AM  [place] at [facility]  [phone]

## 2017-05-15 NOTE — Progress Notes (Signed)
Russell Ray to be D/C'd Home per MD order.  Discussed prescriptions and follow up appointments with the patient. Prescriptions given to patient, medication list explained in detail. Pt verbalized understanding.  Allergies as of 05/15/2017   No Known Allergies     Medication List    STOP taking these medications   chlorthalidone 25 MG tablet Commonly known as:  HYGROTON     TAKE these medications   acetaminophen 325 MG tablet Commonly known as:  TYLENOL Take 650 mg by mouth every 4 (four) hours as needed for moderate pain.   amiodarone 200 MG tablet Commonly known as:  PACERONE 2 tablets twice a day for 5 days then 1 tablet twice a day for 5 days then one tablet daily afterwards   aspirin EC 81 MG tablet Take 81 mg by mouth daily.   cyanocobalamin 1000 MCG tablet Take 1,000 mcg by mouth daily.   cyclobenzaprine 5 MG tablet Commonly known as:  FLEXERIL Take 5 mg by mouth 3 (three) times daily as needed for muscle spasms.   docusate sodium 100 MG capsule Commonly known as:  COLACE Take 1 capsule (100 mg total) by mouth 2 (two) times daily. What changed:    when to take this  reasons to take this   escitalopram 10 MG tablet Commonly known as:  LEXAPRO Take 5 mg by mouth every evening.   Iron (Ferrous Sulfate) 142 (45 Fe) MG Tbcr Take 0.5 tablets by mouth daily.   lisinopril 20 MG tablet Commonly known as:  PRINIVIL,ZESTRIL Take 20 mg by mouth daily.   LORazepam 1 MG tablet Commonly known as:  ATIVAN Take 1 mg by mouth 2 (two) times daily.   Magnesium Oxide 400 (240 Mg) MG Tabs Take 1 tablet (400 mg total) by mouth every morning. Start taking on:  05/16/2017   metoprolol succinate 50 MG 24 hr tablet Commonly known as:  TOPROL-XL Take 50 mg by mouth daily. Take with or immediately following a meal.   ondansetron 4 MG tablet Commonly known as:  ZOFRAN Take 4 mg by mouth every 6 (six) hours as needed for nausea or vomiting.   pioglitazone 45 MG tablet Commonly  known as:  ACTOS Take 45 mg by mouth daily.   pyridOXINE 100 MG tablet Commonly known as:  VITAMIN B-6 Take 100 mg by mouth daily.   warfarin 2 MG tablet Commonly known as:  COUMADIN Take 2 tablets (4 mg total) by mouth every evening.       Vitals:   05/15/17 0334 05/15/17 0839  BP: (!) 103/53 (!) 145/65  Pulse: 82 82  Resp:  18  Temp: (!) 97.4 F (36.3 C)   SpO2: 97% 98%    Tele box removed and returned. Skin clean, dry and intact without evidence of skin break down, no evidence of skin tears noted. IV catheter discontinued intact. Site without signs and symptoms of complications. Dressing and pressure applied. Pt denies pain at this time. No complaints noted.  An After Visit Summary was printed and given to the patient. Patient escorted via [place], and D/C home via private auto.   Russell Ray

## 2017-05-15 NOTE — Discharge Summary (Signed)
[facility] NAME: Russell Ray    MR#:  [id number]  DATE OF BIRTH:  [birth year]  DATE OF ADMISSION:  05/13/2017 ADMITTING PHYSICIAN: Fritzi Mandes, MD  DATE OF DISCHARGE: 05/15/2017 11:39 AM  PRIMARY CARE PHYSICIAN: [name], MD    ADMISSION DIAGNOSIS:  Atrial fibrillation ([place]) [I48.91] Hypomagnesemia [E83.42] Atrial fibrillation with RVR ([place]) [I48.91]  DISCHARGE DIAGNOSIS:  Active Problems:   A-fib ([place])   SECONDARY DIAGNOSIS:   Past Medical History:  Diagnosis Date  . Anxiety   . Aortic valve stenosis   . Atrial fibrillation ([place])   . CHF (congestive heart failure) ([place])   . Depression   . Diabetes mellitus without complication ([place])   . Hypertension     HOSPITAL COURSE:   1.  Atrial fibrillation with rapid ventricular rate.  The patient was admitted to the hospital and kept on his metoprolol and started on amiodarone drip.  He was actually given 1 dose of IV digoxin.  He was cardioverted with a TEE.  This had kept him in normal sinus rhythm.  He was switched over to oral amiodarone 400 mg twice daily for 5 days then 200 mg twice daily for 5 days then 200 mg daily after that.  His Coumadin level was therapeutic for atrial fibrillation but for metallic valve a little bit on the lower side. 2.  Hypomagnesemia.  The patient was given a total of 10 g of IV magnesium during the hospital course.  I think HyGroton is likely the culprit and this medication was discontinued.  Oral magnesium given upon discharge home. 3.  Hypo-natremia.  Likely HyGroton is the culprit.  This medication discontinued.  Recommend checking a BMP and follow-up appointment. 4.  Essential hypertension on metoprolol and lisinopril 5.  Morbid obesity.  Weight loss needed 6.  Type 2 diabetes mellitus on Actos 7.  Depression on Lexapro 8.  History of aortic valve replacement metallic valve on anticoagulation with Coumadin  DISCHARGE CONDITIONS:    Satisfactory  CONSULTS OBTAINED:  Cardiology  DRUG ALLERGIES:  No Known Allergies  DISCHARGE MEDICATIONS:   Allergies as of 05/15/2017   No Known Allergies     Medication List    STOP taking these medications   chlorthalidone 25 MG tablet Commonly known as:  HYGROTON     TAKE these medications   acetaminophen 325 MG tablet Commonly known as:  TYLENOL Take 650 mg by mouth every 4 (four) hours as needed for moderate pain.   amiodarone 200 MG tablet Commonly known as:  PACERONE 2 tablets twice a day for 5 days then 1 tablet twice a day for 5 days then one tablet daily afterwards   aspirin EC 81 MG tablet Take 81 mg by mouth daily.   cyanocobalamin 1000 MCG tablet Take 1,000 mcg by mouth daily.   cyclobenzaprine 5 MG tablet Commonly known as:  FLEXERIL Take 5 mg by mouth 3 (three) times daily as needed for muscle spasms.   docusate sodium 100 MG capsule Commonly known as:  COLACE Take 1 capsule (100 mg total) by mouth 2 (two) times daily. What changed:    when to take this  reasons to take this   escitalopram 10 MG tablet Commonly known as:  LEXAPRO Take 5 mg by mouth every evening.   Iron (Ferrous Sulfate) 142 (45 Fe) MG Tbcr Take 0.5 tablets by mouth daily.   lisinopril 20 MG tablet Commonly known as:  PRINIVIL,ZESTRIL Take 20  mg by mouth daily.   LORazepam 1 MG tablet Commonly known as:  ATIVAN Take 1 mg by mouth 2 (two) times daily.   Magnesium Oxide 400 (240 Mg) MG Tabs Take 1 tablet (400 mg total) by mouth every morning. Start taking on:  05/16/2017   metoprolol succinate 50 MG 24 hr tablet Commonly known as:  TOPROL-XL Take 50 mg by mouth daily. Take with or immediately following a meal.   ondansetron 4 MG tablet Commonly known as:  ZOFRAN Take 4 mg by mouth every 6 (six) hours as needed for nausea or vomiting.   pioglitazone 45 MG tablet Commonly known as:  ACTOS Take 45 mg by mouth daily.   pyridOXINE 100 MG tablet Commonly known  as:  VITAMIN B-6 Take 100 mg by mouth daily.   warfarin 2 MG tablet Commonly known as:  COUMADIN Take 2 tablets (4 mg total) by mouth every evening.        DISCHARGE INSTRUCTIONS:   Follow-up with Dr. Chancy Milroy next week Follow-up with Dr. Westley Gambles in 2 weeks  If you experience worsening of your admission symptoms, develop shortness of breath, life threatening emergency, suicidal or homicidal thoughts you must seek medical attention immediately by calling 911 or calling your MD immediately  if symptoms less severe.  You Must read complete instructions/literature along with all the possible adverse reactions/side effects for all the Medicines you take and that have been prescribed to you. Take any new Medicines after you have completely understood and accept all the possible adverse reactions/side effects.   Please note  You were cared for by a hospitalist during your hospital stay. If you have any questions about your discharge medications or the care you received while you were in the hospital after you are discharged, you can call the unit and asked to speak with the hospitalist on call if the hospitalist that took care of you is not available. Once you are discharged, your primary care physician will handle any further medical issues. Please note that NO REFILLS for any discharge medications will be authorized once you are discharged, as it is imperative that you return to your primary care physician (or establish a relationship with a primary care physician if you do not have one) for your aftercare needs so that they can reassess your need for medications and monitor your lab values.    Today   CHIEF COMPLAINT:   Chief Complaint  Patient presents with  . Chest Pain    HISTORY OF PRESENT ILLNESS:  Russell Ray  is a 47 y.o. male with a known history of came in with chest pain and atrial fibrillation   VITAL SIGNS:  Blood pressure (!) 145/65, pulse 82, temperature (!) 97.4 F (36.3  C), temperature source Oral, resp. rate 18, height 6' (1.829 m), weight (!) 193.7 kg (427 lb), SpO2 98 %.    PHYSICAL EXAMINATION:  GENERAL:  47 y.o.-year-old patient lying in the bed with no acute distress.  EYES: Pupils equal, round, reactive to light and accommodation. No scleral icterus. Extraocular muscles intact.  HEENT: Head atraumatic, normocephalic. Oropharynx and nasopharynx clear.  NECK:  Supple, no jugular venous distention. No thyroid enlargement, no tenderness.  LUNGS: Normal breath sounds bilaterally, no wheezing, rales,rhonchi or crepitation. No use of accessory muscles of respiration.  CARDIOVASCULAR: S1, S2 normal.  Metallic valve heard.  No murmurs, rubs, or gallops.  ABDOMEN: Soft, non-tender, non-distended. Bowel sounds present. No organomegaly or mass.  EXTREMITIES: 2+ pedal edema, no cyanosis,  or clubbing.  NEUROLOGIC: Cranial nerves II through XII are intact. Muscle strength 5/5 in all extremities. Sensation intact. Gait not checked.  PSYCHIATRIC: The patient is alert and oriented x 3.  SKIN: No obvious rash, lesion, or ulcer.   DATA REVIEW:   CBC Recent Labs  Lab 05/13/17 0838  WBC 10.3  HGB 11.3*  HCT 34.4*  PLT 312    Chemistries  Recent Labs  Lab 05/13/17 0838  05/15/17 0755  NA 128*   < > 128*  K 4.3   < > 3.9  CL 97*   < > 95*  CO2 22   < > 23  GLUCOSE 134*   < > 131*  BUN 15   < > 13  CREATININE 0.74   < > 0.77  CALCIUM 9.6   < > 10.0  MG  --    < > 1.2*  AST 36  --   --   ALT 20  --   --   ALKPHOS 46  --   --   BILITOT 0.7  --   --    < > = values in this interval not displayed.    Cardiac Enzymes Recent Labs  Lab 05/13/17 0838  TROPONINI <0.03    Microbiology Results  Results for orders placed or performed during the hospital encounter of 05/03/15  MRSA PCR Screening     Status: None   Collection Time: 05/03/15  7:41 PM  Result Value Ref Range Status   MRSA by PCR NEGATIVE NEGATIVE Final    Comment:        The GeneXpert  MRSA Assay (FDA approved for NASAL specimens only), is one component of a comprehensive MRSA colonization surveillance program. It is not intended to diagnose MRSA infection nor to guide or monitor treatment for MRSA infections.       Management plans discussed with the patient, and he is in agreement.  CODE STATUS:  Code Status History    Date Active Date Inactive Code Status Order ID Comments User Context   05/13/2017 13:18 05/15/2017 14:45 Full Code [id number]  Fritzi Mandes, MD Inpatient   12/02/2016 00:32 12/03/2016 14:36 Full Code [id number]  Jules Husbands, MD Inpatient   05/17/2015 09:57 05/17/2015 18:28 Full Code [id number]  Bettey Costa, MD ED   05/04/2015 09:38 05/05/2015 20:39 Full Code [id number]  Daune Perch, NP Inpatient   05/03/2015 17:34 05/04/2015 09:38 Full Code [id number]  Theodoro Grist, MD ED   04/22/2015 07:37 04/22/2015 21:30 Full Code [id number]  Lytle Butte, MD ED   03/18/2015 14:14 03/19/2015 15:15 Full Code [id number]  Fritzi Mandes, MD Inpatient      TOTAL TIME TAKING CARE OF THIS PATIENT: 34 minutes.    Loletha Grayer M.D on 05/15/2017 at 4:15 PM  Between 7am to 6pm - Pager - [phone]  After 6pm go to [email or web address] - password [email or web address]  [facility] Office  [phone]  CC: Primary care physician; [name], MD

## 2017-05-15 NOTE — Anesthesia Postprocedure Evaluation (Signed)
Anesthesia Post Note  Patient: [name]  Procedure(s) Performed: CARDIOVERSION (N/A )  Patient location during evaluation: PACU Anesthesia Type: General Level of consciousness: awake and alert Pain management: pain level controlled Vital Signs Assessment: post-procedure vital signs reviewed and stable Respiratory status: spontaneous breathing, nonlabored ventilation, respiratory function stable and patient connected to nasal cannula oxygen Cardiovascular status: blood pressure returned to baseline and stable Postop Assessment: no apparent nausea or vomiting Anesthetic complications: no     Last Vitals:  Vitals:   05/15/17 0334 05/15/17 0839  BP: (!) 103/53 (!) 145/65  Pulse: 82 82  Resp:  18  Temp: (!) 36.3 C   SpO2: 97% 98%    Last Pain:  Vitals:   05/15/17 0334  TempSrc: Oral  PainSc:                  Martha Clan

## 2017-05-15 NOTE — Progress Notes (Signed)
ANTICOAGULATION CONSULT NOTE - Initial Consult  Pharmacy Consult for Warfarin Indication: Mechanical Aortic Valve - metal  No Known Allergies  Patient Measurements: Height: 6' (182.9 cm) Weight: (!) 427 lb (193.7 kg) IBW/kg (Calculated) : 77.6 Heparin Dosing Weight:    Vital Signs: Temp: 97.4 F (36.3 C) (03/20 0334) Temp Source: Oral (03/20 0334) BP: 103/53 (03/20 0334) Pulse Rate: 82 (03/20 0334)  Labs: Recent Labs    05/13/17 0838 05/13/17 0839 05/14/17 0440 05/14/17 1520  HGB 11.3*  --   --   --   HCT 34.4*  --   --   --   PLT 312  --   --   --   LABPROT  --  24.0* 24.8* 23.6*  INR  --  2.17 2.26 2.12  CREATININE 0.74  --  0.79  --   TROPONINI <0.03  --   --   --     Estimated Creatinine Clearance: 204.5 mL/min (by C-G formula based on SCr of 0.79 mg/dL).   Medical History: Past Medical History:  Diagnosis Date  . Anxiety   . Aortic valve stenosis   . Atrial fibrillation ([place])   . CHF (congestive heart failure) ([place])   . Depression   . Diabetes mellitus without complication ([place])   . Hypertension     Assessment: Patient is a 47yo male admitted for afib. Patient is s/p aortic valve replacement.  Home dose of Warfarin 4mg  Tuesday, Thursday, Saturday, Sunday 5mg  Monday, Wednesday, Friday  3/18 INR 2.17  Warfarin 5mg   3/19 INR 2.26  Warfarin 4mg   3/20 INR 2.12  Goal of Therapy:  INR 2.5 - 3.5    Plan:  Warfarin 6mg  po daily.  Patient has been started on Amiodarone drip so will need to watch INR closely for increase. Daily INR checks.   Thomasenia Sales, PharmD, MBA, [facility]    05/15/2017 8:11 AM

## 2017-12-28 ENCOUNTER — Other Ambulatory Visit: Payer: Self-pay | Admitting: Surgery

## 2018-07-01 ENCOUNTER — Other Ambulatory Visit: Payer: Self-pay | Admitting: Orthopedic Surgery

## 2018-07-01 DIAGNOSIS — M25561 Pain in right knee: Secondary | ICD-10-CM

## 2018-07-02 ENCOUNTER — Ambulatory Visit: Payer: BLUE CROSS/BLUE SHIELD

## 2018-07-02 ENCOUNTER — Other Ambulatory Visit: Payer: Self-pay | Admitting: Orthopedic Surgery

## 2018-07-02 DIAGNOSIS — M25561 Pain in right knee: Secondary | ICD-10-CM

## 2018-07-04 ENCOUNTER — Ambulatory Visit: Admission: RE | Admit: 2018-07-04 | Payer: BLUE CROSS/BLUE SHIELD | Source: Ambulatory Visit

## 2018-07-04 ENCOUNTER — Ambulatory Visit: Payer: BLUE CROSS/BLUE SHIELD

## 2018-07-07 ENCOUNTER — Other Ambulatory Visit: Payer: BLUE CROSS/BLUE SHIELD

## 2018-07-07 ENCOUNTER — Ambulatory Visit: Payer: BLUE CROSS/BLUE SHIELD

## 2018-07-08 ENCOUNTER — Ambulatory Visit
Admission: RE | Admit: 2018-07-08 | Discharge: 2018-07-08 | Disposition: A | Discharge status: Home / Self Care | Payer: No Typology Code available for payment source | Source: Ambulatory Visit | Attending: Orthopedic Surgery | Admitting: Orthopedic Surgery

## 2018-07-08 ENCOUNTER — Other Ambulatory Visit: Payer: Self-pay

## 2018-07-08 DIAGNOSIS — M25561 Pain in right knee: Secondary | ICD-10-CM

## 2018-07-08 MED ORDER — LIDOCAINE HCL (PF) 1 % IJ SOLN
5.0000 mL | Freq: Once | INTRAMUSCULAR | Status: AC
  Administered 2018-07-08: 5 mL via INTRADERMAL
  Filled 2018-07-08: qty 5

## 2018-07-08 MED ORDER — SODIUM CHLORIDE (PF) 0.9 % IJ SOLN
10.0000 mL | Freq: Once | INTRAMUSCULAR | Status: AC
  Administered 2018-07-08: 11:00:00 10 mL via INTRAVENOUS

## 2018-07-08 MED ORDER — IOHEXOL 180 MG/ML  SOLN
30.0000 mL | Freq: Once | INTRAMUSCULAR | Status: AC | PRN
  Administered 2018-07-08: 11:00:00 30 mL via INTRAVENOUS

## 2018-11-07 ENCOUNTER — Other Ambulatory Visit: Payer: Self-pay | Admitting: Registered Nurse

## 2018-11-07 DIAGNOSIS — I509 Heart failure, unspecified: Secondary | ICD-10-CM

## 2018-11-12 ENCOUNTER — Other Ambulatory Visit: Payer: Self-pay

## 2018-11-12 ENCOUNTER — Ambulatory Visit
Admission: RE | Admit: 2018-11-12 | Discharge: 2018-11-12 | Disposition: A | Discharge status: Home / Self Care | Payer: No Typology Code available for payment source | Source: Ambulatory Visit | Attending: Registered Nurse | Admitting: Registered Nurse

## 2018-11-12 DIAGNOSIS — I352 Nonrheumatic aortic (valve) stenosis with insufficiency: Secondary | ICD-10-CM | POA: Diagnosis not present

## 2018-11-12 DIAGNOSIS — E119 Type 2 diabetes mellitus without complications: Secondary | ICD-10-CM | POA: Insufficient documentation

## 2018-11-12 DIAGNOSIS — I110 Hypertensive heart disease with heart failure: Secondary | ICD-10-CM | POA: Insufficient documentation

## 2018-11-12 DIAGNOSIS — I509 Heart failure, unspecified: Secondary | ICD-10-CM | POA: Diagnosis not present

## 2018-11-12 NOTE — Progress Notes (Signed)
*  PRELIMINARY RESULTS* Echocardiogram 2D Echocardiogram has been performed.  Sherrie Sport 11/12/2018, 10:50 AM

## 2020-07-27 ENCOUNTER — Ambulatory Visit: Payer: 59 | Admitting: Surgery

## 2020-10-19 ENCOUNTER — Inpatient Hospital Stay
Admission: EM | Admit: 2020-10-19 | Discharge: 2020-10-24 | DRG: 378 | Disposition: A | Discharge status: Home / Self Care | Payer: 59 | Attending: Internal Medicine | Admitting: Internal Medicine

## 2020-10-19 ENCOUNTER — Encounter: Payer: Self-pay | Admitting: Emergency Medicine

## 2020-10-19 ENCOUNTER — Emergency Department: Payer: 59

## 2020-10-19 ENCOUNTER — Other Ambulatory Visit: Payer: Self-pay

## 2020-10-19 DIAGNOSIS — G4733 Obstructive sleep apnea (adult) (pediatric): Secondary | ICD-10-CM | POA: Diagnosis present

## 2020-10-19 DIAGNOSIS — Z86010 Personal history of colonic polyps: Secondary | ICD-10-CM

## 2020-10-19 DIAGNOSIS — D509 Iron deficiency anemia, unspecified: Secondary | ICD-10-CM | POA: Diagnosis present

## 2020-10-19 DIAGNOSIS — Z20822 Contact with and (suspected) exposure to covid-19: Secondary | ICD-10-CM | POA: Diagnosis present

## 2020-10-19 DIAGNOSIS — I4811 Longstanding persistent atrial fibrillation: Secondary | ICD-10-CM

## 2020-10-19 DIAGNOSIS — D62 Acute posthemorrhagic anemia: Secondary | ICD-10-CM | POA: Diagnosis present

## 2020-10-19 DIAGNOSIS — Z7901 Long term (current) use of anticoagulants: Secondary | ICD-10-CM | POA: Diagnosis not present

## 2020-10-19 DIAGNOSIS — E119 Type 2 diabetes mellitus without complications: Secondary | ICD-10-CM

## 2020-10-19 DIAGNOSIS — Z7982 Long term (current) use of aspirin: Secondary | ICD-10-CM

## 2020-10-19 DIAGNOSIS — Z952 Presence of prosthetic heart valve: Secondary | ICD-10-CM

## 2020-10-19 DIAGNOSIS — I5032 Chronic diastolic (congestive) heart failure: Secondary | ICD-10-CM | POA: Diagnosis present

## 2020-10-19 DIAGNOSIS — K5731 Diverticulosis of large intestine without perforation or abscess with bleeding: Principal | ICD-10-CM | POA: Diagnosis present

## 2020-10-19 DIAGNOSIS — K625 Hemorrhage of anus and rectum: Secondary | ICD-10-CM | POA: Diagnosis not present

## 2020-10-19 DIAGNOSIS — Z79899 Other long term (current) drug therapy: Secondary | ICD-10-CM

## 2020-10-19 DIAGNOSIS — I110 Hypertensive heart disease with heart failure: Secondary | ICD-10-CM | POA: Diagnosis present

## 2020-10-19 DIAGNOSIS — Z720 Tobacco use: Secondary | ICD-10-CM | POA: Diagnosis present

## 2020-10-19 DIAGNOSIS — Z6841 Body Mass Index (BMI) 40.0 and over, adult: Secondary | ICD-10-CM

## 2020-10-19 DIAGNOSIS — K922 Gastrointestinal hemorrhage, unspecified: Secondary | ICD-10-CM

## 2020-10-19 DIAGNOSIS — I480 Paroxysmal atrial fibrillation: Secondary | ICD-10-CM | POA: Diagnosis present

## 2020-10-19 DIAGNOSIS — Z833 Family history of diabetes mellitus: Secondary | ICD-10-CM

## 2020-10-19 DIAGNOSIS — I10 Essential (primary) hypertension: Secondary | ICD-10-CM | POA: Diagnosis present

## 2020-10-19 DIAGNOSIS — Z8249 Family history of ischemic heart disease and other diseases of the circulatory system: Secondary | ICD-10-CM

## 2020-10-19 DIAGNOSIS — D500 Iron deficiency anemia secondary to blood loss (chronic): Secondary | ICD-10-CM | POA: Diagnosis present

## 2020-10-19 DIAGNOSIS — I4891 Unspecified atrial fibrillation: Secondary | ICD-10-CM | POA: Diagnosis present

## 2020-10-19 DIAGNOSIS — F419 Anxiety disorder, unspecified: Secondary | ICD-10-CM | POA: Diagnosis present

## 2020-10-19 DIAGNOSIS — K640 First degree hemorrhoids: Secondary | ICD-10-CM | POA: Diagnosis present

## 2020-10-19 DIAGNOSIS — E66813 Obesity, class 3: Secondary | ICD-10-CM | POA: Diagnosis present

## 2020-10-19 DIAGNOSIS — Z87891 Personal history of nicotine dependence: Secondary | ICD-10-CM

## 2020-10-19 LAB — CBC WITH DIFFERENTIAL/PLATELET
Abs Immature Granulocytes: 0.05 10*3/uL (ref 0.00–0.07)
Abs Immature Granulocytes: 0.04 10*3/uL (ref 0.00–0.07)
Abs Immature Granulocytes: 0.04 10*3/uL (ref 0.00–0.07)
Basophils Absolute: 0 10*3/uL (ref 0.0–0.1)
Basophils Absolute: 0 10*3/uL (ref 0.0–0.1)
Basophils Absolute: 0 10*3/uL (ref 0.0–0.1)
Basophils Relative: 0 %
Basophils Relative: 0 %
Basophils Relative: 0 %
Eosinophils Absolute: 0.1 10*3/uL (ref 0.0–0.5)
Eosinophils Absolute: 0.1 10*3/uL (ref 0.0–0.5)
Eosinophils Absolute: 0.1 10*3/uL (ref 0.0–0.5)
Eosinophils Relative: 1 %
Eosinophils Relative: 1 %
Eosinophils Relative: 1 %
HCT: 36 % — ABNORMAL LOW (ref 39.0–52.0)
HCT: 32.4 % — ABNORMAL LOW (ref 39.0–52.0)
HCT: 33 % — ABNORMAL LOW (ref 39.0–52.0)
Hemoglobin: 10.9 g/dL — ABNORMAL LOW (ref 13.0–17.0)
Hemoglobin: 10.1 g/dL — ABNORMAL LOW (ref 13.0–17.0)
Hemoglobin: 10.3 g/dL — ABNORMAL LOW (ref 13.0–17.0)
Immature Granulocytes: 1 %
Immature Granulocytes: 1 %
Immature Granulocytes: 1 %
Lymphocytes Relative: 13 %
Lymphocytes Relative: 13 %
Lymphocytes Relative: 17 %
Lymphs Abs: 0.8 10*3/uL (ref 0.7–4.0)
Lymphs Abs: 0.9 10*3/uL (ref 0.7–4.0)
Lymphs Abs: 1.1 10*3/uL (ref 0.7–4.0)
MCH: 28.8 pg (ref 26.0–34.0)
MCH: 29.2 pg (ref 26.0–34.0)
MCH: 29 pg (ref 26.0–34.0)
MCHC: 30.3 g/dL (ref 30.0–36.0)
MCHC: 31.2 g/dL (ref 30.0–36.0)
MCHC: 31.2 g/dL (ref 30.0–36.0)
MCV: 95.2 fL (ref 80.0–100.0)
MCV: 93.6 fL (ref 80.0–100.0)
MCV: 93 fL (ref 80.0–100.0)
Monocytes Absolute: 0.4 10*3/uL (ref 0.1–1.0)
Monocytes Absolute: 0.5 10*3/uL (ref 0.1–1.0)
Monocytes Absolute: 0.5 10*3/uL (ref 0.1–1.0)
Monocytes Relative: 7 %
Monocytes Relative: 8 %
Monocytes Relative: 8 %
Neutro Abs: 4.9 10*3/uL (ref 1.7–7.7)
Neutro Abs: 5.3 10*3/uL (ref 1.7–7.7)
Neutro Abs: 5 10*3/uL (ref 1.7–7.7)
Neutrophils Relative %: 78 %
Neutrophils Relative %: 77 %
Neutrophils Relative %: 73 %
Platelets: 194 10*3/uL (ref 150–400)
Platelets: 191 10*3/uL (ref 150–400)
Platelets: 184 10*3/uL (ref 150–400)
RBC: 3.78 MIL/uL — ABNORMAL LOW (ref 4.22–5.81)
RBC: 3.46 MIL/uL — ABNORMAL LOW (ref 4.22–5.81)
RBC: 3.55 MIL/uL — ABNORMAL LOW (ref 4.22–5.81)
RDW: 16.4 % — ABNORMAL HIGH (ref 11.5–15.5)
RDW: 16.2 % — ABNORMAL HIGH (ref 11.5–15.5)
RDW: 16.3 % — ABNORMAL HIGH (ref 11.5–15.5)
WBC: 6.3 10*3/uL (ref 4.0–10.5)
WBC: 6.8 10*3/uL (ref 4.0–10.5)
WBC: 6.8 10*3/uL (ref 4.0–10.5)
nRBC: 0 % (ref 0.0–0.2)
nRBC: 0 % (ref 0.0–0.2)
nRBC: 0 % (ref 0.0–0.2)

## 2020-10-19 LAB — COMPREHENSIVE METABOLIC PANEL
ALT: 16 U/L (ref 0–44)
AST: 17 U/L (ref 15–41)
Albumin: 3.5 g/dL (ref 3.5–5.0)
Alkaline Phosphatase: 85 U/L (ref 38–126)
Anion gap: 11 (ref 5–15)
BUN: 21 mg/dL — ABNORMAL HIGH (ref 6–20)
CO2: 25 mmol/L (ref 22–32)
Calcium: 9.8 mg/dL (ref 8.9–10.3)
Chloride: 101 mmol/L (ref 98–111)
Creatinine, Ser: 0.67 mg/dL (ref 0.61–1.24)
GFR, Estimated: >60 mL/min (ref >60)
Glucose, Bld: 117 mg/dL — ABNORMAL HIGH (ref 70–99)
Potassium: 4.1 mmol/L (ref 3.5–5.1)
Sodium: 137 mmol/L (ref 135–145)
Total Bilirubin: 1 mg/dL (ref 0.3–1.2)
Total Protein: 6.8 g/dL (ref 6.5–8.1)

## 2020-10-19 LAB — PROTIME-INR
INR: 3.1 — ABNORMAL HIGH (ref 0.8–1.2)
INR: 1.6 — ABNORMAL HIGH (ref 0.8–1.2)
Prothrombin Time: 32.2 seconds — ABNORMAL HIGH (ref 11.4–15.2)
Prothrombin Time: 19.3 seconds — ABNORMAL HIGH (ref 11.4–15.2)

## 2020-10-19 LAB — TROPONIN I (HIGH SENSITIVITY)
Troponin I (High Sensitivity): 7 ng/L (ref <18)
Troponin I (High Sensitivity): 8 ng/L (ref <18)

## 2020-10-19 LAB — HEMOGLOBIN AND HEMATOCRIT, BLOOD
HCT: 35.2 % — ABNORMAL LOW (ref 39.0–52.0)
Hemoglobin: 10.7 g/dL — ABNORMAL LOW (ref 13.0–17.0)

## 2020-10-19 LAB — LACTIC ACID, PLASMA: Lactic Acid, Venous: 1.4 mmol/L (ref 0.5–1.9)

## 2020-10-19 LAB — LIPASE, BLOOD: Lipase: 23 U/L (ref 11–51)

## 2020-10-19 LAB — RESP PANEL BY RT-PCR (FLU A&B, COVID) ARPGX2
Influenza A by PCR: NEGATIVE
Influenza B by PCR: NEGATIVE
SARS Coronavirus 2 by RT PCR: NEGATIVE

## 2020-10-19 LAB — APTT: aPTT: 38 seconds — ABNORMAL HIGH (ref 24–36)

## 2020-10-19 LAB — BRAIN NATRIURETIC PEPTIDE: B Natriuretic Peptide: 52.4 pg/mL (ref 0.0–100.0)

## 2020-10-19 MED ORDER — IOHEXOL 350 MG/ML SOLN: 125.0000 mL | Freq: Once | INTRAVENOUS | Status: DC | PRN

## 2020-10-19 MED ORDER — MORPHINE SULFATE (PF) 2 MG/ML IV SOLN
2.0000 mg | Freq: Once | INTRAVENOUS | Status: AC
Start: 2020-10-19 — End: 2020-10-19
  Administered 2020-10-19: 2 mg via INTRAVENOUS
  Filled 2020-10-19: qty 1

## 2020-10-19 MED ORDER — PROTHROMBIN COMPLEX CONC HUMAN 500 UNITS IV KIT
2182.0000 [IU] | PACK | Status: AC
  Administered 2020-10-19: 2182 [IU] via INTRAVENOUS
  Filled 2020-10-19: qty 2182

## 2020-10-19 MED ORDER — ACETAMINOPHEN 325 MG PO TABS
650.0000 mg | ORAL_TABLET | Freq: Once | ORAL | Status: AC
  Administered 2020-10-19: 650 mg via ORAL
  Filled 2020-10-19: qty 2

## 2020-10-19 MED ORDER — LORAZEPAM 1 MG PO TABS
1.0000 mg | ORAL_TABLET | Freq: Once | ORAL | Status: AC
  Administered 2020-10-19: 1 mg via ORAL
  Filled 2020-10-19: qty 1

## 2020-10-19 MED ORDER — LORAZEPAM 2 MG/ML IJ SOLN
0.5000 mg | Freq: Once | INTRAMUSCULAR | Status: AC
  Administered 2020-10-20: 0.5 mg via INTRAVENOUS
  Filled 2020-10-19: qty 1

## 2020-10-19 MED ORDER — MORPHINE SULFATE (PF) 4 MG/ML IV SOLN
4.0000 mg | Freq: Once | INTRAVENOUS | Status: AC
  Administered 2020-10-19: 4 mg via INTRAVENOUS
  Filled 2020-10-19: qty 1

## 2020-10-19 MED ORDER — ONDANSETRON HCL 4 MG/2ML IJ SOLN
4.0000 mg | Freq: Once | INTRAMUSCULAR | Status: AC
  Administered 2020-10-19: 4 mg via INTRAVENOUS
  Filled 2020-10-19: qty 2

## 2020-10-19 MED ORDER — IOHEXOL 9 MG/ML PO SOLN: 1000.0000 mL | Freq: Once | ORAL | Status: DC

## 2020-10-19 MED ORDER — TECHNETIUM TC 99M-LABELED RED BLOOD CELLS IV KIT
30.0000 | PACK | Freq: Once | INTRAVENOUS | Status: AC | PRN
  Administered 2020-10-19: 30.38 via INTRAVENOUS

## 2020-10-19 MED ORDER — CYCLOBENZAPRINE HCL 10 MG PO TABS
5.0000 mg | ORAL_TABLET | Freq: Once | ORAL | Status: AC
  Administered 2020-10-19: 5 mg via ORAL
  Filled 2020-10-19: qty 1

## 2020-10-19 MED ORDER — SODIUM CHLORIDE 0.9 % IV SOLN: 10.0000 mL/h | Freq: Once | INTRAVENOUS | Status: DC

## 2020-10-19 MED ORDER — VITAMIN K1 10 MG/ML IJ SOLN
10.0000 mg | INTRAVENOUS | Status: AC
  Administered 2020-10-19: 10 mg via INTRAVENOUS
  Filled 2020-10-19: qty 1

## 2020-10-19 MED ORDER — PANTOPRAZOLE 80MG IVPB - SIMPLE MED
80.0000 mg | Freq: Once | INTRAVENOUS | Status: AC
  Administered 2020-10-19: 80 mg via INTRAVENOUS
  Filled 2020-10-19: qty 100

## 2020-10-19 MED ORDER — SODIUM CHLORIDE 0.9 % IV SOLN: Freq: Once | INTRAVENOUS | Status: AC

## 2020-10-19 NOTE — ED Notes (Signed)
Pt had bright red blood BM

## 2020-10-19 NOTE — ED Provider Notes (Addendum)
[facility] Emergency Department Provider Note   ____________________________________________   Event Date/Time   First MD Initiated Contact with Patient 10/19/20 [phone]     (approximate)  I have reviewed the triage vital signs and the nursing notes.   HISTORY  Chief Complaint Rectal Bleeding    HPI Russell Ray is a 50 y.o. male who reports 3 episodes of bloody diarrhea since 11:00 last night.  He is somewhat lightheaded he says.  He also reports he fell in the shower several days ago and had some left-sided upper abdomen low chest pain went away after about a day or 2 and then returned and is moved down to the left lower quadrant.  Pain there is sharp worse with movement but only 3 out of 10 currently.  Patient jumps when I palpate him in that area.        Past Medical History:  Diagnosis Date   Anxiety    Aortic valve stenosis    Atrial fibrillation (HCC)    CHF (congestive heart failure) ([place])    Depression    Diabetes mellitus without complication ([place])    Hypertension     Patient Active Problem List   Diagnosis Date Noted   Ventral hernia with bowel obstruction 12/01/2016   Atrial fibrillation ([place]) 05/17/2015   Elevated troponin 05/05/2015   Obesity 05/05/2015   Obstructive sleep apnea 05/05/2015   Hyponatremia 05/05/2015   Leukocytosis 05/05/2015   Anemia 05/05/2015   Anticoagulated on Coumadin 05/05/2015   H/O aortic valve replacement 05/05/2015   A-fib ([place]) 05/03/2015   Atrial fibrillation with RVR ([place]) 05/03/2015   Anxiety 04/23/2015   CHF (congestive heart failure) ([place]) 04/23/2015   Essential (primary) hypertension 04/23/2015   Type 2 diabetes mellitus without complication ([place]) 99991111   Chest pain, central 04/22/2015   Morbid obesity with BMI of 50.0-59.9, adult ([place]) 04/22/2015   Morbid obesity ([place]) 03/19/2015   Leg swelling 03/19/2015   Orthopnea 03/19/2015   Tobacco abuse 03/19/2015   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    Past Surgical History:  Procedure Laterality Date   APPLICATION OF WOUND VAC     CARDIAC CATHETERIZATION Right 04/22/2015   Procedure: Right/Left Heart Cath and Coronary Angiography;  Surgeon: Dionisio David, MD;  Location: [facility];  Service: Cardiovascular;  Laterality: Right;   CARDIAC VALVE REPLACEMENT     CARDIOVERSION N/A 05/14/2017   Procedure: CARDIOVERSION;  Surgeon: Dionisio David, MD;  Location: [facility] ORS;  Service: Cardiovascular;  Laterality: N/A;   COLONOSCOPY WITH PROPOFOL N/A 10/05/2016   Procedure: COLONOSCOPY WITH PROPOFOL;  Surgeon: Manya Silvas, MD;  Location: [facility] ENDOSCOPY;  Service: Endoscopy;  Laterality: N/A;   debridement sternum     ELECTROPHYSIOLOGIC STUDY N/A 05/05/2015   Procedure: Cardioversion;  Surgeon: Dionisio David, MD;  Location: [facility] ORS;  Service: Cardiovascular;  Laterality: N/A;   ELECTROPHYSIOLOGIC STUDY N/A 05/17/2015   Procedure: CARDIOVERSION;  Surgeon: Dionisio David, MD;  Location: [facility] ORS;  Service: Cardiovascular;  Laterality: N/A;   ESOPHAGOGASTRODUODENOSCOPY (EGD) WITH PROPOFOL N/A 10/05/2016   Procedure: ESOPHAGOGASTRODUODENOSCOPY (EGD) WITH PROPOFOL;  Surgeon: Manya Silvas, MD;  Location: [facility] ENDOSCOPY;  Service: Endoscopy;  Laterality: N/A;   GREATER OMENTAL FLAP CLOSURE     HERNIA REPAIR     X3   VALVE REPLACEMENT     VENTRAL HERNIA REPAIR N/A 12/01/2016   Procedure: HERNIA REPAIR VENTRAL ADULT with mesh;  Surgeon: Jules Husbands, MD;  Location: [facility] ORS;  Service: General;  Laterality: N/A;    Prior to Admission medications   Medication Sig Start Date End Date Taking? Authorizing Provider  acetaminophen (TYLENOL) 325 MG tablet Take 650 mg by mouth every 4 (four) hours as needed for moderate pain.    [provider]  amiodarone (PACERONE) 200 MG tablet 2 tablets twice a day for 5 days then 1 tablet twice a day for 5 days then one tablet daily afterwards 05/15/17   Loletha Grayer, MD  aspirin EC 81 MG tablet Take 81 mg by mouth daily.     [provider]  cyanocobalamin 1000 MCG tablet Take 1,000 mcg by mouth daily.    [provider]  cyclobenzaprine (FLEXERIL) 5 MG tablet Take 5 mg by mouth 3 (three) times daily as needed for muscle spasms.     [provider]  escitalopram (LEXAPRO) 10 MG tablet Take 5 mg by mouth every evening.     [provider]  Iron, Ferrous Sulfate, 142 (45 Fe) MG TBCR Take 0.5 tablets by mouth daily.    [provider]  lisinopril (PRINIVIL,ZESTRIL) 20 MG tablet Take 20 mg by mouth daily.    [provider]  LORazepam (ATIVAN) 1 MG tablet Take 1 mg by mouth 2 (two) times daily.     [provider]  Magnesium Oxide 400 (240 Mg) MG TABS Take 1 tablet (400 mg total) by mouth every morning. 05/16/17   Loletha Grayer, MD  metoprolol succinate (TOPROL-XL) 50 MG 24 hr tablet Take 50 mg by mouth daily. Take with or immediately following a meal.    [provider]  ondansetron (ZOFRAN) 4 MG tablet Take 4 mg by mouth every 6 (six) hours as needed for nausea or vomiting.     [provider]  pioglitazone (ACTOS) 45 MG tablet Take 45 mg by mouth daily.    [provider]  pyridOXINE (VITAMIN B-6) 100 MG tablet Take 100 mg by mouth daily.    [provider]  warfarin (COUMADIN) 2 MG tablet Take 2 tablets (4 mg total) by mouth every evening. 05/15/17   Loletha Grayer, MD    Allergies Patient has no known allergies.  Family History  Problem Relation Age of Onset   Hypertension Mother    Hypertension Maternal Grandmother    Diabetes Maternal Grandmother     Social History Social History   Tobacco Use   Smoking status: Former    Packs/day: 2.00    Years: 28.00    Pack years: 56.00    Types: Cigarettes   Smokeless tobacco: Never  Vaping Use   Vaping Use: Never used  Substance Use Topics   Alcohol use: No   Drug use: No    Review of  Systems  Constitutional: No fever/chills Eyes: No visual changes. ENT: No sore throat. Cardiovascular: Denies chest pain. Respiratory: shortness of breath with exertion. Gastrointestinal: abdominal pain.  No nausea, no vomiting.  Bloody diarrhea.  No constipation. Genitourinary: Negative for dysuria. Musculoskeletal: Negative for back pain. Skin: Negative for rash. Neurological: Negative for headaches, focal weakness or   ____________________________________________   PHYSICAL EXAM:  VITAL SIGNS: ED Triage Vitals  Enc Vitals Group     BP 10/19/20 0724 (!) 147/101     Pulse Rate 10/19/20 0724 (!) 116     Resp 10/19/20 0724 (!) 22     Temp 10/19/20 0724 98.1 F (36.7 C)     Temp Source 10/19/20 0724 Oral  SpO2 10/19/20 0724 94 %     Weight 10/19/20 0720 (!) 541 lb (245.4 kg)     Height 10/19/20 0720 6' (1.829 m)     Head Circumference --      Peak Flow --      Pain Score 10/19/20 0724 3     Pain Loc --      Pain Edu? --      Excl. in [place]? --    Constitutional: Alert and oriented. Well appearing and in no acute distress. Eyes: Conjunctivae are normal. PER. EOMI. Head: Atraumatic. Nose: No congestion/rhinnorhea. Mouth/Throat: Mucous membranes are moist.  Oropharynx non-erythematous. Neck: No stridor.   Cardiovascular: Normal rate, regular rhythm. Grossly normal heart sounds, heart valve heard clicking normally at the left lower sternal border.  Good peripheral circulation. Respiratory: Normal respiratory effort.  No retractions. Lungs CTAB. Gastrointestinal: Soft tender to palpation left lower quadrant no distention. No abdominal bruits. No CVA tenderness. Musculoskeletal: No lower extremity tenderness bilateral at least 1+ edema.   Neurologic:  Normal speech and language. No gross focal neurologic deficits are appreciated. No gait instability. Skin:  Skin is warm, dry and intact. No rash noted.  There are venous stasis changes in the  legs  ____________________________________________   LABS (all labs ordered are listed, but only abnormal results are displayed)  Labs Reviewed  COMPREHENSIVE METABOLIC PANEL - Abnormal; Notable for the following components:      Result Value   Glucose, Bld 117 (*)    BUN 21 (*)    All other components within normal limits  CBC WITH DIFFERENTIAL/PLATELET - Abnormal; Notable for the following components:   RBC 3.78 (*)    Hemoglobin 10.9 (*)    HCT 36.0 (*)    RDW 16.4 (*)    All other components within normal limits  PROTIME-INR - Abnormal; Notable for the following components:   Prothrombin Time 32.2 (*)    INR 3.1 (*)    All other components within normal limits  APTT - Abnormal; Notable for the following components:   aPTT 38 (*)    All other components within normal limits  RESP PANEL BY RT-PCR (FLU A&B, COVID) ARPGX2  LIPASE, BLOOD  BRAIN NATRIURETIC PEPTIDE  LACTIC ACID, PLASMA  HEMOGLOBIN AND HEMATOCRIT, BLOOD  TYPE AND SCREEN  PREPARE RBC (CROSSMATCH)  TROPONIN I (HIGH SENSITIVITY)  TROPONIN I (HIGH SENSITIVITY)   ____________________________________________  EKG  EKG read interpreted by me shows sinus tachycardia rate of 117 normal axis right bundle branch block low amplitude no obvious acute ST-T wave changes ____________________________________________  RADIOLOGY Gertha Calkin, personally viewed and evaluated these images (plain radiographs) as part of my medical decision making, as well as reviewing the written report by the radiologist.  ED MD interpretation:    Official radiology report(s): NM GI Blood Loss  Result Date: 10/19/2020 CLINICAL DATA:  Evaluate for GI bleed. EXAM: NUCLEAR MEDICINE GASTROINTESTINAL BLEEDING SCAN TECHNIQUE: Sequential abdominal images were obtained following intravenous administration of Tc-42mlabeled red blood cells. RADIOPHARMACEUTICALS:  30.38 mCi Tc-92mertechnetate in-vitro labeled red cells. COMPARISON:  None.  FINDINGS: Abnormal intraluminal radiotracer activity is identified within the left lower quadrant of the abdomen. This has a tubular configuration which increases in intensity over time and progresses in a antegrade distribution. Intraluminal radiotracer activity is favored to originate from the descending colon (which is excluded from the field of view due to patient's body habitus) or proximal sigmoid colon. IMPRESSION: Examination is positive for active GI  bleed. Bowel activity is most intense within the left lower quadrant of the abdomen consistent with active bleeding within the left colon. These results were called by telephone at the time of interpretation on 10/19/2020 at 3:27 pm to provider [facility] , who verbally acknowledged these results. Electronically Signed   By: Kerby Moors M.D.   On: 10/19/2020 15:28   DG Chest Portable 1 View  Result Date: 10/19/2020 CLINICAL DATA:  50 year old male with shortness of breath. Left lower quadrant pain. Bright red blood per rectum. EXAM: PORTABLE CHEST 1 VIEW COMPARISON:  Portable chest 05/13/2017 and earlier. FINDINGS: Portable AP semi upright view at 0805 hours. Chronically low lung volumes. Normal cardiac size and mediastinal contours. Visualized tracheal air column is within normal limits. Allowing for portable technique the lungs are clear. No pneumothorax or pleural effusion. No acute osseous abnormality identified. IMPRESSION: Low lung volumes.  No acute cardiopulmonary abnormality. Electronically Signed   By: Genevie Ann M.D.   On: 10/19/2020 08:28    ____________________________________________   PROCEDURES  Procedure(s) performed (including Critical Care):  Procedures   ____________________________________________   INITIAL IMPRESSION / ASSESSMENT AND PLAN / ED COURSE   ----------------------------------------- 9:02 AM on 10/19/2020 ----------------------------------------- Patient has had another large bloody stool.  Heart rate  however is come down to 101.  Patient still complaining of left-sided pain.    ----------------------------------------- 9:27 AM on 10/19/2020 ----------------------------------------- Have discussed the patient with Dr. Clayborn Bigness cardiology who recommends to stopping the Coumadin for right now.  I am waiting for Dr. Alice Reichert GI to call back.  Waiting for the CT scan before decide on transfer.   ----------------------------------------- 9:46 AM on 10/19/2020 ----------------------------------------- Patient had asked to go to [place] has no beds.  He would have to be waitlisted.  I do not think that would serve any purpose at this point.  Patient could become unstable quickly.  Dr. Alice Reichert has called me and wants a bleeding scan which I have ordered.  Clinical Course as of 10/19/20 1533  Wed Oct 19, 2020  1530 L colon or sigmoid [KM]    Clinical Course User Index [KM] Rada Hay, MD  ----------------------------------------- 10:39 AM on 10/19/2020 ----------------------------------------- Patient is too large for CT scanner.  CT will not operate with him on the table.  Table is good for 550 pounds or more so he is likely heavier than that.  Nuclear medicine scan has been ordered we will get that and then notify Dr. Alice Reichert. ----------------------------------------- 3:32 PM on 10/19/2020 ----------------------------------------- Patient returns from IR.  IR reports there is a left colonic bleed possibly sigmoid patient slightly too large to tell for sure which.  Discussed with Dr. Alice Reichert who has found the patient has had a prior diverticular bleed.  Wants [place] to call IR.  We are doing that now.  I will sign the patient out to the oncoming physician and I have ordered a consult with the hospitalist.  ____________________________________________   FINAL CLINICAL IMPRESSION(S) / ED DIAGNOSES  Final diagnoses:  Rectal bleeding  Lower GI bleed     ED Discharge Orders      None        Note:  This document was prepared using Dragon voice recognition software and may include unintentional dictation errors.    Nena Polio, MD 10/19/20 [id number]    Nena Polio, MD 10/19/20 1535

## 2020-10-19 NOTE — ED Notes (Signed)
Pt going to nuc med at this time and RN to assist if pt needs blood tx. MD made aware and okay that blood will be started when pt returns from imaging which could last up to 3 hours. Charge RN made aware.

## 2020-10-19 NOTE — ED Triage Notes (Signed)
Pt arrives via EMS from home with rectal bleeding since last night around 11 pm. States he has had left lower abd pain since a fall last week. Reports 3 falls in the last 3 months. Pt on blood thinners from cardiac hx.

## 2020-10-19 NOTE — Consult Note (Signed)
Medical Consultation   Russell Ray  [id number]  DOB: 05/30/1970  DOA: 10/19/2020  PCP: [name], MD   Outpatient Specialists: None   Requesting physician: Dr Jacqulyn Cane  Reason for consultation: Medical mqanagement   History of Present Illness: Russell Ray is an 50 y.o. male with history of atrial fibrillation, tobacco abuse, morbid obesity, obstructive sleep apnea, history of aortic valve replacement on chronic Coumadin therapy and type 2 diabetes who was seen in the ER with lower GI bleed.  Patient was hemodynamically stable but having active bleed.  This was confirmed on CTA abdomen that showed ongoing active bleed.  Patient was seen by both cardiology and GI with recommendation for reversing his anticoagulation that has been done.  INR now down to 1.6 from about 3.  Patient however will require evaluation by IR and possibly vascular.  Due to his weight status which is more than 500 such a procedure is not possible here or at [facility].  Contact has been established with a referral to [facility].  He has been accepted but no beds yet.  We have been consulted for medical management while patient is awaiting transfer.  He currently is hemodynamically stable.  His INR as indicated has been reversed.  He is complaining of pain about 8 out of 10 in his abdomen.  He is otherwise stable.   Review of Systems:   As per HPI otherwise 10 point review of systems negative.    Review of Systems  Constitutional:  Positive for fatigue.  HENT: Negative.    Eyes: Negative.   Respiratory:  Positive for shortness of breath.   Cardiovascular:  Positive for leg swelling.  Gastrointestinal:  Positive for abdominal pain and blood in stool.  Endocrine: Negative.   Genitourinary: Negative.   Musculoskeletal: Negative.   Skin: Negative.   Neurological: Negative.   Hematological: Negative.   Psychiatric/Behavioral: Negative.    Past Medical History: Past  Medical History:  Diagnosis Date   Anxiety    Aortic valve stenosis    Atrial fibrillation (HCC)    CHF (congestive heart failure) ([place])    Depression    Diabetes mellitus without complication ([place])    Hypertension     Past Surgical History: Past Surgical History:  Procedure Laterality Date   APPLICATION OF WOUND VAC     CARDIAC CATHETERIZATION Right 04/22/2015   Procedure: Right/Left Heart Cath and Coronary Angiography;  Surgeon: Dionisio David, MD;  Location: [place] [facility];  Service: Cardiovascular;  Laterality: Right;   CARDIAC VALVE REPLACEMENT     CARDIOVERSION N/A 05/14/2017   Procedure: CARDIOVERSION;  Surgeon: Dionisio David, MD;  Location: [facility] ORS;  Service: Cardiovascular;  Laterality: N/A;   COLONOSCOPY WITH PROPOFOL N/A 10/05/2016   Procedure: COLONOSCOPY WITH PROPOFOL;  Surgeon: Manya Silvas, MD;  Location: [facility] ENDOSCOPY;  Service: Endoscopy;  Laterality: N/A;   debridement sternum     ELECTROPHYSIOLOGIC STUDY N/A 05/05/2015   Procedure: Cardioversion;  Surgeon: Dionisio David, MD;  Location: [facility] ORS;  Service: Cardiovascular;  Laterality: N/A;   ELECTROPHYSIOLOGIC STUDY N/A 05/17/2015   Procedure: CARDIOVERSION;  Surgeon: Dionisio David, MD;  Location: [facility] ORS;  Service: Cardiovascular;  Laterality: N/A;   ESOPHAGOGASTRODUODENOSCOPY (EGD) WITH PROPOFOL N/A 10/05/2016   Procedure: ESOPHAGOGASTRODUODENOSCOPY (EGD) WITH PROPOFOL;  Surgeon: Manya Silvas, MD;  Location: [facility] ENDOSCOPY;  Service: Endoscopy;  Laterality: N/A;  GREATER OMENTAL FLAP CLOSURE     HERNIA REPAIR     X3   VALVE REPLACEMENT     VENTRAL HERNIA REPAIR N/A 12/01/2016   Procedure: HERNIA REPAIR VENTRAL ADULT with mesh;  Surgeon: Jules Husbands, MD;  Location: [facility] ORS;  Service: General;  Laterality: N/A;     Allergies:  No Known Allergies   Social History:  reports that he has quit smoking. His smoking use included cigarettes. He has a 56.00 pack-year smoking history. He has  never used smokeless tobacco. He reports that he does not drink alcohol and does not use drugs.   Family History: Family History  Problem Relation Age of Onset   Hypertension Mother    Hypertension Maternal Grandmother    Diabetes Maternal Grandmother     Physical Exam: Vitals:   10/19/20 1730 10/19/20 1830 10/19/20 1930 10/19/20 2030  BP: 107/61 114/65 104/65 (!) 124/56  Pulse: 98 97 (!) 111 95  Resp: '16 20 15 15  '$ Temp:      TempSrc:      SpO2: 95% 96% 93% 93%  Weight:      Height:        Constitutional: Alert and awake, Pale, oriented x3, not in any acute distress. Eyes: PERLA, EOMI, irises appear normal, anicteric sclera, Conjunctival Pallor ENMT: external ears and nose appear normal, Lips appears normal, oropharynx mucosa, tongue, posterior pharynx appear normal  Neck: neck appears normal, no masses, normal ROM, no thyromegaly, no JVD  CVS: Irregularly irregular tachycardic, no murmur rubs or gallops, no LE edema, normal pedal pulses  Respiratory:  clear to auscultation bilaterally, no wheezing, rales or rhonchi. Respiratory effort normal. No accessory muscle use.  Abdomen: soft nontender, nondistended, normal bowel sounds, no hepatosplenomegaly, no hernias  Musculoskeletal: : no cyanosis, clubbing or edema noted bilaterally Neuro: Cranial nerves II-XII intact, strength, sensation, reflexes Psych: judgement and insight appear normal, stable mood and affect, mental status Skin: no rashes or lesions or ulcers, no induration or nodules   Data reviewed:  I have personally reviewed following labs and imaging studies Labs:  CBC: Recent Labs  Lab 10/19/20 0737 10/19/20 1538 10/19/20 1935  WBC 6.3  --  6.8  NEUTROABS 4.9  --  5.3  HGB 10.9* 10.7* 10.1*  HCT 36.0* 35.2* 32.4*  MCV 95.2  --  93.6  PLT 194  --  99991111    Basic Metabolic Panel: Recent Labs  Lab 10/19/20 0737  NA 137  K 4.1  CL 101  CO2 25  GLUCOSE 117*  BUN 21*  CREATININE 0.67  CALCIUM 9.8    GFR Estimated Creatinine Clearance: 231.1 mL/min (by C-G formula based on SCr of 0.67 mg/dL). Liver Function Tests: Recent Labs  Lab 10/19/20 0737  AST 17  ALT 16  ALKPHOS 85  BILITOT 1.0  PROT 6.8  ALBUMIN 3.5   Recent Labs  Lab 10/19/20 0737  LIPASE 23   No results for input(s): AMMONIA in the last 168 hours. Coagulation profile Recent Labs  Lab 10/19/20 0737 10/19/20 1935  INR 3.1* 1.6*    Cardiac Enzymes: No results for input(s): CKTOTAL, CKMB, CKMBINDEX, TROPONINI in the last 168 hours. BNP: Invalid input(s): POCBNP CBG: No results for input(s): GLUCAP in the last 168 hours. D-Dimer No results for input(s): DDIMER in the last 72 hours. Hgb A1c No results for input(s): HGBA1C in the last 72 hours. Lipid Profile No results for input(s): CHOL, HDL, LDLCALC, TRIG, CHOLHDL, LDLDIRECT in the last 72 hours. Thyroid  function studies No results for input(s): TSH, T4TOTAL, T3FREE, THYROIDAB in the last 72 hours.  Invalid input(s): FREET3 Anemia work up No results for input(s): VITAMINB12, FOLATE, FERRITIN, TIBC, IRON, RETICCTPCT in the last 72 hours. Urinalysis    Component Value Date/Time   COLORURINE STRAW (A) 03/19/2015 0440   APPEARANCEUR CLEAR (A) 03/19/2015 0440   LABSPEC 1.008 03/19/2015 0440   PHURINE 6.0 03/19/2015 0440   GLUCOSEU NEGATIVE 03/19/2015 0440   HGBUR NEGATIVE 03/19/2015 0440   BILIRUBINUR NEGATIVE 03/19/2015 0440   KETONESUR NEGATIVE 03/19/2015 0440   PROTEINUR NEGATIVE 03/19/2015 0440   NITRITE NEGATIVE 03/19/2015 0440   LEUKOCYTESUR NEGATIVE 03/19/2015 0440     Sepsis Labs Invalid input(s): PROCALCITONIN,  WBC,  LACTICIDVEN Microbiology Recent Results (from the past 240 hour(s))  Resp Panel by RT-PCR (Flu A&B, Covid) Nasopharyngeal Swab     Status: None   Collection Time: 10/19/20  3:38 PM   Specimen: Nasopharyngeal Swab; Nasopharyngeal(NP) swabs in vial transport medium  Result Value Ref Range Status   SARS Coronavirus 2 by  RT PCR NEGATIVE NEGATIVE Final    Comment: (NOTE) SARS-CoV-2 target nucleic acids are NOT DETECTED.  The SARS-CoV-2 RNA is generally detectable in upper respiratory specimens during the acute phase of infection. The lowest concentration of SARS-CoV-2 viral copies this assay can detect is 138 copies/mL. A negative result does not preclude SARS-Cov-2 infection and should not be used as the sole basis for treatment or other patient management decisions. A negative result may occur with  improper specimen collection/handling, submission of specimen other than nasopharyngeal swab, presence of viral mutation(s) within the areas targeted by this assay, and inadequate number of viral copies(<138 copies/mL). A negative result must be combined with clinical observations, patient history, and epidemiological information. The expected result is Negative.  Fact Sheet for Patients:  EntrepreneurPulse.com.au  Fact Sheet for Healthcare Providers:  IncredibleEmployment.be  This test is no t yet approved or cleared by the [place] FDA and  has been authorized for detection and/or diagnosis of SARS-CoV-2 by FDA under an Emergency Use Authorization (EUA). This EUA will remain  in effect (meaning this test can be used) for the duration of the COVID-19 declaration under Section 564(b)(1) of the Act, 21 U.S.C.section 360bbb-3(b)(1), unless the authorization is terminated  or revoked sooner.       Influenza A by PCR NEGATIVE NEGATIVE Final   Influenza B by PCR NEGATIVE NEGATIVE Final    Comment: (NOTE) The Xpert Xpress SARS-CoV-2/FLU/RSV plus assay is intended as an aid in the diagnosis of influenza from Nasopharyngeal swab specimens and should not be used as a sole basis for treatment. Nasal washings and aspirates are unacceptable for Xpert Xpress SARS-CoV-2/FLU/RSV testing.  Fact Sheet for Patients: EntrepreneurPulse.com.au  Fact Sheet  for Healthcare Providers: IncredibleEmployment.be  This test is not yet approved or cleared by the [place] FDA and has been authorized for detection and/or diagnosis of SARS-CoV-2 by FDA under an Emergency Use Authorization (EUA). This EUA will remain in effect (meaning this test can be used) for the duration of the COVID-19 declaration under Section 564(b)(1) of the Act, 21 U.S.C. section 360bbb-3(b)(1), unless the authorization is terminated or revoked.  Performed at [facility], [address] [place], [place] [address]        Inpatient Medications:   Scheduled Meds:  iohexol  1,000 mL Oral Once   Continuous Infusions:  sodium chloride Stopped (10/19/20 1540)   pantoprazole (PROTONIX) IV 80 mg (10/19/20 2114)  Radiological Exams on Admission: NM GI Blood Loss  Result Date: 10/19/2020 CLINICAL DATA:  Evaluate for GI bleed. EXAM: NUCLEAR MEDICINE GASTROINTESTINAL BLEEDING SCAN TECHNIQUE: Sequential abdominal images were obtained following intravenous administration of Tc-66mlabeled red blood cells. RADIOPHARMACEUTICALS:  30.38 mCi Tc-958mertechnetate in-vitro labeled red cells. COMPARISON:  None. FINDINGS: Abnormal intraluminal radiotracer activity is identified within the left lower quadrant of the abdomen. This has a tubular configuration which increases in intensity over time and progresses in a antegrade distribution. Intraluminal radiotracer activity is favored to originate from the descending colon (which is excluded from the field of view due to patient's body habitus) or proximal sigmoid colon. IMPRESSION: Examination is positive for active GI bleed. Bowel activity is most intense within the left lower quadrant of the abdomen consistent with active bleeding within the left colon. These results were called by telephone at the time of interpretation on 10/19/2020 at 3:27 pm to [facility] who verbally acknowledged these  results. Electronically Signed   By: TaKerby Moors.D.   On: 10/19/2020 15:28   DG Chest Portable 1 View  Result Date: 10/19/2020 CLINICAL DATA:  [age]ear old male with shortness of breath. Left lower quadrant pain. Bright red blood per rectum. EXAM: PORTABLE CHEST 1 VIEW COMPARISON:  Portable chest 05/13/2017 and earlier. FINDINGS: Portable AP semi upright view at 0805 hours. Chronically low lung volumes. Normal cardiac size and mediastinal contours. Visualized tracheal air column is within normal limits. Allowing for portable technique the lungs are clear. No pneumothorax or pleural effusion. No acute osseous abnormality identified. IMPRESSION: Low lung volumes.  No acute cardiopulmonary abnormality. Electronically Signed   By: H Genevie Ann.D.   On: 10/19/2020 08:28    Impression/Recommendations Principal Problem:   GI bleed Active Problems:   Tobacco abuse   A-fib (HCC)   Obstructive sleep apnea   Anticoagulated on Coumadin   H/O aortic valve replacement   Essential (primary) hypertension   Morbid obesity with BMI of 50.0-59.9, adult (HCC)   Type 2 diabetes mellitus without complication (HCCuster #1 lower GI bleed: H&H at this point is stable with hemoglobin 10.1.  He has been to have uncrossed match.  Recommend serial H&H with hemoglobin check every 6 hours.  We will transfuse if there is any with evidence of drop in hemoglobin.  Keep on telemetry.  Recommend IV Protonix.  #2 paroxysmal atrial fibrillation: We will recommend IV metoprolol for rate control.  Patient should be complete NPO.  #3 obstructive sleep apnea: Recommend CPAP at night.  #4 chronic anticoagulation: INR has been reversed secondary to lower GI bleed.  We will recommend SCD only.  Continue monitoring on resume anticoagulation when safe to do so  #5 history of aortic valve replacement: Holding anticoagulation for now but will need to resume as soon as possible.  #6 diabetes: Recommend sliding scale insulin.  #7  essential hypertension: Hold antihypertensives while active GI bleed is ongoing.  Thank you for this consultation.  [facility] will follow the patient with you.   Time Spent: 5053inutes   Teoman Giraud,LAWAL M.D. [facility] 10/19/2020, 9:27 PM

## 2020-10-19 NOTE — ED Notes (Signed)
[facility] called for [facility] for potential transfer, awaiting return call

## 2020-10-19 NOTE — ED Notes (Signed)
EDP made aware of pt requesting pain and nausea meds

## 2020-10-19 NOTE — ED Notes (Signed)
Pt requesting to hold on blood tx at this point. EDP made aware and repeat h&h sent to lab.

## 2020-10-19 NOTE — ED Provider Notes (Addendum)
Was signed out to me at 3 PM.  In brief he is a 50 year old male with a history of aortic valve repair on Coumadin who presents with lower GI bleed.  He was unable to fit inside the CT scanner to get a CTA unfortunately due to his morbid obesity so tagged red blood cell scan was obtained which is positive for extra have in the colon.  His initial hemoglobin was 10.7, he was initially tachycardic but otherwise normotensive.  He had 1 bloody stool in the ED.  At the time of signout he is pending vascular consult.  I spoke with both IR and vascular surgery who note that unfortunately our bed only carries up to 500 pounds.  Discussed with [facility] who also can only accommodate 500 pounds.  Attempted to obtain an accurate weight on the patient but unfortunately the bed only reads error, apparently our beds can only read up to 500 pounds.  A repeat hemoglobin was drawn which is stable.  I discussed with Dr. Towanda Malkin about reversal of his anticoagulation given now there is active bleeding on the scan.  He agreed that this is reasonable.  Patient given Kcentra and vitamin K.  Repeat INR is 1.6 from 3.1.  I attempted to transfer the patient to [facility] and [facility] but unfortunately they did not have beds.  I then spoke with [facility] who will have the patient on a wait list.  I discussed with Dr. Alice Reichert who thinks its its acceptable for the patient to be here as long as there is a transfer that is being worked on.  [facility] unfortunately does not know when they will have an open bed.  In the meantime I did consult the hospitalist for admission here.  The hospitalist prefers to keep the patient in the ED given he may become unstable on the floor which I think is reasonable.  Patient will be managed by the hospitalist and ED team together.  [place] hemodynamically stable at this time.  Patient seen by hospitalist who is helped coordinate his care.  We will plan for H&H every 3 hours.  Patient remains hemodynamically stable at this  time.  If his condition were to deteriorate will call [facility] and attempt to expedite t/f.     Procedures .Critical Care  Date/Time: 10/20/2020 12:28 AM Performed by: Rada Hay, MD Authorized by: Rada Hay, MD   Critical care provider statement:    Critical care time (minutes):  240   Critical care time was exclusive of:  Separately billable procedures and treating other patients   Critical care was necessary to treat or prevent imminent or life-threatening deterioration of the following conditions:  Circulatory failure   Critical care was time spent personally by me on the following activities:  Development of treatment plan with patient or surrogate, discussions with consultants, evaluation of patient's response to treatment, examination of patient, ordering and performing treatments and interventions, ordering and review of laboratory studies, ordering and review of radiographic studies and re-evaluation of patient's condition   I assumed direction of critical care for this patient from another provider in my specialty: no     Care discussed with: accepting provider at another facility    Rada Hay, MD 10/19/20 2102    Rada Hay, MD 10/20/20 0030

## 2020-10-19 NOTE — ED Notes (Signed)
RN provided peri care on pt after BM. Pt has large amount of blood with clots. RN notified MD.

## 2020-10-19 NOTE — Consult Note (Signed)
GI Inpatient Consult Note  Reason for Consult: Hematochezia    Attending Requesting Consult: Dr. Conni Slipper, MD  History of Present Illness: Russell Ray is a 50 y.o. male seen for evaluation of hematochezia at the request of ED physician - Dr. Conni Slipper. Pt has a PMH of HTN, anxiety, depression, T2DM, CHF, atrial fibrillation with RVR, hx of aortic valve replacement on chronic anticoagulation with Coumadin, morbid obesity (BMI 73.37), tobacco abuse, and OSA. He presented to the [facility] ED via EMS this morning for chief complaint of hematochezia. He reports 3 episodes of bloody diarrhea since 11:00 PM last night. He reports a fall in the shower about a week and a half ago and since this time has had some left-sided abdominal pain and now located in the left lower quadrant. Upon presentation to the ED, labs were significant for hemoglobin 10.9, MCV 95, and hematocrit 36. Baseline hemoglobin appears to be 11s. INR was supratherapeutic at 3.1. He had negative chest x-ray, negative lactic acid, and negative troponins x2. He had a large bloody BM with clots around 0850 this morning per nursing report. He also had another episode of hematochezia around 3:30 this afternoon. Dr. Alice Reichert was called and advised bleeding scan to rule out any active GI bleed. Patient had bleeding scan performed this afternoon.   Patient seen and examined this afternoon. He just got back from his bleeding scan and had another episode of hematochezia with clots. He is having some tenderness in his LLQ. He reports no previous history of GI bleeding. He reports a fall in his shower about a week and a half ago and this has been giving him some trouble. He initially endorses pain in his left upper abdomen after the fall and then it went away a couple days ago and then came back last night. He reports he has been having a lot of health issues over the past 4 years. He has been having cardiac issues and had a aortic valve replacement in 2018. He  reports a weight gain of 100 lbs over this time frame and being a lot more sedentary. He has had a lot of issues with right knee pain and has been following with orthopaedics. He lives with his mom. He reports his wife left him several years ago.    Summary of GI Procedures:  CSY 09/2016 (Dr. Vira Agar) - Three small polyps at the recto-sigmoid colon, in the transverse colon and at the hepatic flexure, removed with a hot snare. Resected and retrieved. Clips were placed. - Internal hemorrhoids. - The examination was otherwise normal. - Path: SSA, TA, and hyperplastic polyp  EGD 09/2016 (Dr. Vira Agar) - LA Grade A reflux esophagitis. - Normal stomach. - Normal examined duodenum. - No specimens collected.  Past Medical History:  Past Medical History:  Diagnosis Date   Anxiety    Aortic valve stenosis    Atrial fibrillation (HCC)    CHF (congestive heart failure) ([place])    Depression    Diabetes mellitus without complication ([place])    Hypertension     Problem List: Patient Active Problem List   Diagnosis Date Noted   Ventral hernia with bowel obstruction 12/01/2016   Atrial fibrillation ([place]) 05/17/2015   Elevated troponin 05/05/2015   Obesity 05/05/2015   Obstructive sleep apnea 05/05/2015   Hyponatremia 05/05/2015   Leukocytosis 05/05/2015   Anemia 05/05/2015   Anticoagulated on Coumadin 05/05/2015   H/O aortic valve replacement 05/05/2015   A-fib ([place]) 05/03/2015   Atrial fibrillation with  RVR ([place]) 05/03/2015   Anxiety 04/23/2015   CHF (congestive heart failure) ([place]) 04/23/2015   Essential (primary) hypertension 04/23/2015   Type 2 diabetes mellitus without complication ([place]) 99991111   Chest pain, central 04/22/2015   Morbid obesity with BMI of 50.0-59.9, adult ([place]) 04/22/2015   Morbid obesity ([place]) 03/19/2015   Leg swelling 03/19/2015   Orthopnea 03/19/2015   Tobacco abuse 03/19/2015   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    Past  Surgical History: Past Surgical History:  Procedure Laterality Date   APPLICATION OF WOUND VAC     CARDIAC CATHETERIZATION Right 04/22/2015   Procedure: Right/Left Heart Cath and Coronary Angiography;  Surgeon: Dionisio David, MD;  Location: [facility];  Service: Cardiovascular;  Laterality: Right;   CARDIAC VALVE REPLACEMENT     CARDIOVERSION N/A 05/14/2017   Procedure: CARDIOVERSION;  Surgeon: Dionisio David, MD;  Location: [facility] ORS;  Service: Cardiovascular;  Laterality: N/A;   COLONOSCOPY WITH PROPOFOL N/A 10/05/2016   Procedure: COLONOSCOPY WITH PROPOFOL;  Surgeon: Manya Silvas, MD;  Location: [facility] ENDOSCOPY;  Service: Endoscopy;  Laterality: N/A;   debridement sternum     ELECTROPHYSIOLOGIC STUDY N/A 05/05/2015   Procedure: Cardioversion;  Surgeon: Dionisio David, MD;  Location: [facility] ORS;  Service: Cardiovascular;  Laterality: N/A;   ELECTROPHYSIOLOGIC STUDY N/A 05/17/2015   Procedure: CARDIOVERSION;  Surgeon: Dionisio David, MD;  Location: [facility] ORS;  Service: Cardiovascular;  Laterality: N/A;   ESOPHAGOGASTRODUODENOSCOPY (EGD) WITH PROPOFOL N/A 10/05/2016   Procedure: ESOPHAGOGASTRODUODENOSCOPY (EGD) WITH PROPOFOL;  Surgeon: Manya Silvas, MD;  Location: [facility] ENDOSCOPY;  Service: Endoscopy;  Laterality: N/A;   GREATER OMENTAL FLAP CLOSURE     HERNIA REPAIR     X3   VALVE REPLACEMENT     VENTRAL HERNIA REPAIR N/A 12/01/2016   Procedure: HERNIA REPAIR VENTRAL ADULT with mesh;  Surgeon: Jules Husbands, MD;  Location: [facility] ORS;  Service: General;  Laterality: N/A;    Allergies: No Known Allergies  Home Medications: (Not in a hospital admission)  Home medication reconciliation was completed with the patient.   Scheduled Inpatient Medications:    iohexol  1,000 mL Oral Once    Continuous Inpatient Infusions:    sodium chloride Stopped (10/19/20 1540)   phytonadione (VITAMIN K) IV     prothrombin complex conc human (Kcentra) IVPB      PRN Inpatient Medications:   iohexol  Family History: family history includes Diabetes in his maternal grandmother; Hypertension in his maternal grandmother and mother.  The patient's family history is negative for inflammatory bowel disorders, GI malignancy, or solid organ transplantation.  Social History:   reports that he has quit smoking. His smoking use included cigarettes. He has a 56.00 pack-year smoking history. He has never used smokeless tobacco. He reports that he does not drink alcohol and does not use drugs. The patient denies ETOH, tobacco, or drug use.   Review of Systems: Constitutional: + weight gain  Eyes: No changes in vision. ENT: No oral lesions, sore throat.  GI: see HPI.  Heme/Lymph: No easy bruising.  CV: No chest pain.  GU: No hematuria.  Integumentary: No rashes.  Neuro: No headaches.  Psych: No depression/anxiety.  Endocrine: No heat/cold intolerance.  Allergic/Immunologic: No urticaria.  Resp: No cough, SOB.  Musculoskeletal: No joint swelling.    Physical Examination: BP 117/67   Pulse 96   Temp 98.1 F (36.7 C) (Oral)   Resp 13   Ht 6' (1.829  m)   Wt (!) 245.4 kg   SpO2 94%   BMI 73.37 kg/m  Morbidly obese male in ED stretcher. Answers all questions appropriately. No accessory muscle use upon breathing.  Gen: NAD, alert and oriented x 4 HEENT: PEERLA, EOMI, Neck: supple, no JVD or thyromegaly Chest: CTA bilaterally, no wheezes, crackles, or other adventitious sounds CV: RRR, no m/g/c/r Abd: soft, nondistended, morbidly obese abdomen, hypoactive bowel sounds, tender to deep palpation in LLQ, no HSM, guarding, ridigity, or rebound tenderness Ext: no edema, well perfused with 2+ pulses, Skin: no rash or lesions noted Lymph: no LAD  Data: Lab Results  Component Value Date   WBC 6.3 10/19/2020   HGB 10.7 (L) 10/19/2020   HCT 35.2 (L) 10/19/2020   MCV 95.2 10/19/2020   PLT 194 10/19/2020   Recent Labs  Lab 10/19/20 0737 10/19/20 1538  HGB 10.9* 10.7*   Lab  Results  Component Value Date   NA 137 10/19/2020   K 4.1 10/19/2020   CL 101 10/19/2020   CO2 25 10/19/2020   BUN 21 (H) 10/19/2020   CREATININE 0.67 10/19/2020   Lab Results  Component Value Date   ALT 16 10/19/2020   AST 17 10/19/2020   ALKPHOS 85 10/19/2020   BILITOT 1.0 10/19/2020   Recent Labs  Lab 10/19/20 0737  APTT 38*  INR 3.1*   Assessment/Plan:  50 y/o Caucasian male with a PMH of HTN, anxiety, depression, T2DM, CHF, atrial fibrillation with RVR, hx of aortic valve replacement on chronic anticoagulation with Coumadin, morbid obesity (BMI 73.37), tobacco abuse, and OSA presented to the [facility] ED via EMS from home this morning for hematochezia  Hematochezia/Lower GI bleed  2.   History of adenomatous colon polyps  3.   History of aortic valve replacement on Warfarin  4.   Morbid obesity - BMI 73.37   - Based on clinical presentation, differential diagnosis includes acute diverticular bleed, colonic AVMs, advanced adenomas, colorectal neoplasm, anal outlet bleeding from internal hemorrhoids, ischemic colitis, nonspecific colitis, small bowel bleeding, or potentially UGI bleeding from peptic ulcer disease, gastritis, or duodenitis. LGI source suspected based on clinical presentation.  - NM GI Bleeding scan POSITIVE for hemorrhage in the left colon - Discussed plan of care with ED physician who reports patient's weight exceeds the limit for our beds here and will need to be transferred to another facility  - Anticipate transfer to [facility] for embolization via Vascular Surgery or Interventional Radiology - Maintain 2 large bore Ivs for access - Continue to monitor serial H&H. Transfuse for Hgb <7.0.  - Continue serial abdominal examinations  - Hold all antiplatelet and anticoagulation currently in setting of active GIB - If he develops active GI hemorrhage with hemodynamic instability, will need to potentially have unsedated flexible sigmoidoscopy.      Thank  you for the consult. Please call with questions or concerns.  [facility] [phone] (Cell)

## 2020-10-19 NOTE — ED Notes (Signed)
[facility] does not have a bed.

## 2020-10-19 NOTE — Progress Notes (Signed)
[place] for INR monitoring post-Kcentra administration  No Known Allergies  Patient Measurements: Height: 6' (182.9 cm) Weight: (!) 245.4 kg (541 lb) IBW/kg (Calculated) : 77.6  Vital Signs: BP: 104/65 (08/24 1930) Pulse Rate: 111 (08/24 1930)   Labs: Recent Labs    10/19/20 0737 10/19/20 1538 10/19/20 1935  WBC 6.3  --  6.8  HGB 10.9* 10.7* 10.1*  HCT 36.0* 35.2* 32.4*  PLT 194  --  191  APTT 38*  --   --   CREATININE 0.67  --   --   ALBUMIN 3.5  --   --   PROT 6.8  --   --   AST 17  --   --   ALT 16  --   --   ALKPHOS 85  --   --   BILITOT 1.0  --   --     Estimated Creatinine Clearance: 231.1 mL/min (by C-G formula based on SCr of 0.67 mg/dL).   Medical History: Past Medical History:  Diagnosis Date   Anxiety    Aortic valve stenosis    Atrial fibrillation (HCC)    CHF (congestive heart failure) (HCC)    Depression    Diabetes mellitus without complication (HCC)    Hypertension      Assessment: 50 year old male presented with rectal bleeding. Patient has had several episodes of bloody bowel movements. He also had a fall several days prior to presentation with associated left-sided upper abdomen pain that has moved into lower quadrant. He is on warfarin for h/o aortic valve replacement. Nuclear medicine GI bleeding scan positive for hemorrhage in the left colon. Patient received Kcentra and Vitamin K in the ED. Pharmacy to monitor INR post-Kcentra administration.   Plan:  INR 1.6 post-Kcentra administration. INR ordered with morning labs next two days. Patient currently pending transfer.  Tawnya Crook, PharmD, BCPS Clinical Pharmacist 10/19/2020 8:05 PM

## 2020-10-19 NOTE — ED Notes (Signed)
[facility] transfer Center called again ref IR transfer, facesheet faxed

## 2020-10-19 NOTE — Progress Notes (Signed)
[place] for INR monitoring post-Kcentra administration  No Known Allergies  Patient Measurements: Height: 6' (182.9 cm) Weight: (!) 245.4 kg (541 lb) IBW/kg (Calculated) : 77.6  Vital Signs: Temp: 98.1 F (36.7 C) (08/24 0724) Temp Source: Oral (08/24 0724) BP: 107/61 (08/24 1730) Pulse Rate: 98 (08/24 1730)   Labs: Recent Labs    10/19/20 0737 10/19/20 1538  WBC 6.3  --   HGB 10.9* 10.7*  HCT 36.0* 35.2*  PLT 194  --   APTT 38*  --   CREATININE 0.67  --   ALBUMIN 3.5  --   PROT 6.8  --   AST 17  --   ALT 16  --   ALKPHOS 85  --   BILITOT 1.0  --    Estimated Creatinine Clearance: 231.1 mL/min (by C-G formula based on SCr of 0.67 mg/dL).   Medical History: Past Medical History:  Diagnosis Date   Anxiety    Aortic valve stenosis    Atrial fibrillation (HCC)    CHF (congestive heart failure) (HCC)    Depression    Diabetes mellitus without complication (HCC)    Hypertension      Assessment: 50 year old male presented with rectal bleeding. Patient has had several episodes of bloody bowel movements. He also had a fall several days prior to presentation with associated left-sided upper abdomen pain that has moved into lower quadrant. He is on warfarin for h/o aortic valve replacement. Nuclear medicine GI bleeding scan positive for hemorrhage in the left colon. Patient received Kcentra and Vitamin K in the ED. Pharmacy to monitor INR post-Kcentra administration.   Plan:  Order for INR at 1900 (1 hr post Kcentra) then daily for 2 days.  Tawnya Crook, PharmD, BCPS Clinical Pharmacist 10/19/2020 5:47 PM

## 2020-10-19 NOTE — ED Notes (Signed)
Patient transported to CT 

## 2020-10-20 ENCOUNTER — Emergency Department: Payer: 59

## 2020-10-20 DIAGNOSIS — I4891 Unspecified atrial fibrillation: Secondary | ICD-10-CM

## 2020-10-20 DIAGNOSIS — Z79899 Other long term (current) drug therapy: Secondary | ICD-10-CM | POA: Diagnosis not present

## 2020-10-20 DIAGNOSIS — Z952 Presence of prosthetic heart valve: Secondary | ICD-10-CM | POA: Diagnosis not present

## 2020-10-20 DIAGNOSIS — Z86010 Personal history of colonic polyps: Secondary | ICD-10-CM | POA: Diagnosis not present

## 2020-10-20 DIAGNOSIS — I5032 Chronic diastolic (congestive) heart failure: Secondary | ICD-10-CM | POA: Diagnosis present

## 2020-10-20 DIAGNOSIS — I110 Hypertensive heart disease with heart failure: Secondary | ICD-10-CM | POA: Diagnosis present

## 2020-10-20 DIAGNOSIS — G4733 Obstructive sleep apnea (adult) (pediatric): Secondary | ICD-10-CM | POA: Diagnosis present

## 2020-10-20 DIAGNOSIS — K640 First degree hemorrhoids: Secondary | ICD-10-CM | POA: Diagnosis present

## 2020-10-20 DIAGNOSIS — I480 Paroxysmal atrial fibrillation: Secondary | ICD-10-CM | POA: Diagnosis present

## 2020-10-20 DIAGNOSIS — Z87891 Personal history of nicotine dependence: Secondary | ICD-10-CM | POA: Diagnosis not present

## 2020-10-20 DIAGNOSIS — Z20822 Contact with and (suspected) exposure to covid-19: Secondary | ICD-10-CM | POA: Diagnosis present

## 2020-10-20 DIAGNOSIS — K922 Gastrointestinal hemorrhage, unspecified: Secondary | ICD-10-CM | POA: Diagnosis present

## 2020-10-20 DIAGNOSIS — F419 Anxiety disorder, unspecified: Secondary | ICD-10-CM

## 2020-10-20 DIAGNOSIS — D500 Iron deficiency anemia secondary to blood loss (chronic): Secondary | ICD-10-CM | POA: Diagnosis present

## 2020-10-20 DIAGNOSIS — Z7982 Long term (current) use of aspirin: Secondary | ICD-10-CM | POA: Diagnosis not present

## 2020-10-20 DIAGNOSIS — D62 Acute posthemorrhagic anemia: Secondary | ICD-10-CM | POA: Diagnosis present

## 2020-10-20 DIAGNOSIS — K625 Hemorrhage of anus and rectum: Secondary | ICD-10-CM | POA: Diagnosis present

## 2020-10-20 DIAGNOSIS — E119 Type 2 diabetes mellitus without complications: Secondary | ICD-10-CM | POA: Diagnosis present

## 2020-10-20 DIAGNOSIS — Z6841 Body Mass Index (BMI) 40.0 and over, adult: Secondary | ICD-10-CM | POA: Diagnosis not present

## 2020-10-20 DIAGNOSIS — D509 Iron deficiency anemia, unspecified: Secondary | ICD-10-CM

## 2020-10-20 DIAGNOSIS — Z8249 Family history of ischemic heart disease and other diseases of the circulatory system: Secondary | ICD-10-CM | POA: Diagnosis not present

## 2020-10-20 DIAGNOSIS — K5731 Diverticulosis of large intestine without perforation or abscess with bleeding: Secondary | ICD-10-CM | POA: Diagnosis present

## 2020-10-20 DIAGNOSIS — Z833 Family history of diabetes mellitus: Secondary | ICD-10-CM | POA: Diagnosis not present

## 2020-10-20 DIAGNOSIS — I10 Essential (primary) hypertension: Secondary | ICD-10-CM

## 2020-10-20 DIAGNOSIS — Z7901 Long term (current) use of anticoagulants: Secondary | ICD-10-CM | POA: Diagnosis not present

## 2020-10-20 LAB — CBC WITH DIFFERENTIAL/PLATELET
Abs Immature Granulocytes: 0.05 10*3/uL (ref 0.00–0.07)
Abs Immature Granulocytes: 0.05 10*3/uL (ref 0.00–0.07)
Abs Immature Granulocytes: 0.04 10*3/uL (ref 0.00–0.07)
Abs Immature Granulocytes: 0.05 10*3/uL (ref 0.00–0.07)
Abs Immature Granulocytes: 0.04 10*3/uL (ref 0.00–0.07)
Abs Immature Granulocytes: 0.04 10*3/uL (ref 0.00–0.07)
Basophils Absolute: 0 10*3/uL (ref 0.0–0.1)
Basophils Absolute: 0 10*3/uL (ref 0.0–0.1)
Basophils Absolute: 0 10*3/uL (ref 0.0–0.1)
Basophils Absolute: 0 10*3/uL (ref 0.0–0.1)
Basophils Absolute: 0 10*3/uL (ref 0.0–0.1)
Basophils Absolute: 0 10*3/uL (ref 0.0–0.1)
Basophils Relative: 0 %
Basophils Relative: 0 %
Basophils Relative: 1 %
Basophils Relative: 0 %
Basophils Relative: 0 %
Basophils Relative: 1 %
Eosinophils Absolute: 0.1 10*3/uL (ref 0.0–0.5)
Eosinophils Absolute: 0.1 10*3/uL (ref 0.0–0.5)
Eosinophils Absolute: 0.1 10*3/uL (ref 0.0–0.5)
Eosinophils Absolute: 0.1 10*3/uL (ref 0.0–0.5)
Eosinophils Absolute: 0.1 10*3/uL (ref 0.0–0.5)
Eosinophils Absolute: 0.1 10*3/uL (ref 0.0–0.5)
Eosinophils Relative: 1 %
Eosinophils Relative: 1 %
Eosinophils Relative: 1 %
Eosinophils Relative: 1 %
Eosinophils Relative: 1 %
Eosinophils Relative: 1 %
HCT: 33.2 % — ABNORMAL LOW (ref 39.0–52.0)
HCT: 32.2 % — ABNORMAL LOW (ref 39.0–52.0)
HCT: 33.9 % — ABNORMAL LOW (ref 39.0–52.0)
HCT: 33.1 % — ABNORMAL LOW (ref 39.0–52.0)
HCT: 33.5 % — ABNORMAL LOW (ref 39.0–52.0)
HCT: 33.3 % — ABNORMAL LOW (ref 39.0–52.0)
Hemoglobin: 10.2 g/dL — ABNORMAL LOW (ref 13.0–17.0)
Hemoglobin: 10 g/dL — ABNORMAL LOW (ref 13.0–17.0)
Hemoglobin: 10.4 g/dL — ABNORMAL LOW (ref 13.0–17.0)
Hemoglobin: 10.2 g/dL — ABNORMAL LOW (ref 13.0–17.0)
Hemoglobin: 10.5 g/dL — ABNORMAL LOW (ref 13.0–17.0)
Hemoglobin: 10.3 g/dL — ABNORMAL LOW (ref 13.0–17.0)
Immature Granulocytes: 1 %
Immature Granulocytes: 1 %
Immature Granulocytes: 1 %
Immature Granulocytes: 1 %
Immature Granulocytes: 1 %
Immature Granulocytes: 1 %
Lymphocytes Relative: 19 %
Lymphocytes Relative: 16 %
Lymphocytes Relative: 14 %
Lymphocytes Relative: 13 %
Lymphocytes Relative: 14 %
Lymphocytes Relative: 14 %
Lymphs Abs: 1.3 10*3/uL (ref 0.7–4.0)
Lymphs Abs: 1 10*3/uL (ref 0.7–4.0)
Lymphs Abs: 0.9 10*3/uL (ref 0.7–4.0)
Lymphs Abs: 0.8 10*3/uL (ref 0.7–4.0)
Lymphs Abs: 0.8 10*3/uL (ref 0.7–4.0)
Lymphs Abs: 0.9 10*3/uL (ref 0.7–4.0)
MCH: 28.4 pg (ref 26.0–34.0)
MCH: 28.4 pg (ref 26.0–34.0)
MCH: 28 pg (ref 26.0–34.0)
MCH: 28.1 pg (ref 26.0–34.0)
MCH: 28.6 pg (ref 26.0–34.0)
MCH: 28.1 pg (ref 26.0–34.0)
MCHC: 30.7 g/dL (ref 30.0–36.0)
MCHC: 31.1 g/dL (ref 30.0–36.0)
MCHC: 30.7 g/dL (ref 30.0–36.0)
MCHC: 30.8 g/dL (ref 30.0–36.0)
MCHC: 31.3 g/dL (ref 30.0–36.0)
MCHC: 30.9 g/dL (ref 30.0–36.0)
MCV: 92.5 fL (ref 80.0–100.0)
MCV: 91.5 fL (ref 80.0–100.0)
MCV: 91.4 fL (ref 80.0–100.0)
MCV: 91.2 fL (ref 80.0–100.0)
MCV: 91.3 fL (ref 80.0–100.0)
MCV: 90.7 fL (ref 80.0–100.0)
Monocytes Absolute: 0.5 10*3/uL (ref 0.1–1.0)
Monocytes Absolute: 0.5 10*3/uL (ref 0.1–1.0)
Monocytes Absolute: 0.4 10*3/uL (ref 0.1–1.0)
Monocytes Absolute: 0.4 10*3/uL (ref 0.1–1.0)
Monocytes Absolute: 0.3 10*3/uL (ref 0.1–1.0)
Monocytes Absolute: 0.5 10*3/uL (ref 0.1–1.0)
Monocytes Relative: 8 %
Monocytes Relative: 8 %
Monocytes Relative: 6 %
Monocytes Relative: 7 %
Monocytes Relative: 6 %
Monocytes Relative: 7 %
Neutro Abs: 4.8 10*3/uL (ref 1.7–7.7)
Neutro Abs: 4.7 10*3/uL (ref 1.7–7.7)
Neutro Abs: 4.9 10*3/uL (ref 1.7–7.7)
Neutro Abs: 4.5 10*3/uL (ref 1.7–7.7)
Neutro Abs: 4.9 10*3/uL (ref 1.7–7.7)
Neutro Abs: 5.1 10*3/uL (ref 1.7–7.7)
Neutrophils Relative %: 71 %
Neutrophils Relative %: 74 %
Neutrophils Relative %: 77 %
Neutrophils Relative %: 78 %
Neutrophils Relative %: 78 %
Neutrophils Relative %: 76 %
Platelets: 184 10*3/uL (ref 150–400)
Platelets: 175 10*3/uL (ref 150–400)
Platelets: 167 10*3/uL (ref 150–400)
Platelets: 173 10*3/uL (ref 150–400)
Platelets: 164 10*3/uL (ref 150–400)
Platelets: 161 10*3/uL (ref 150–400)
RBC: 3.59 MIL/uL — ABNORMAL LOW (ref 4.22–5.81)
RBC: 3.52 MIL/uL — ABNORMAL LOW (ref 4.22–5.81)
RBC: 3.71 MIL/uL — ABNORMAL LOW (ref 4.22–5.81)
RBC: 3.63 MIL/uL — ABNORMAL LOW (ref 4.22–5.81)
RBC: 3.67 MIL/uL — ABNORMAL LOW (ref 4.22–5.81)
RBC: 3.67 MIL/uL — ABNORMAL LOW (ref 4.22–5.81)
RDW: 16.2 % — ABNORMAL HIGH (ref 11.5–15.5)
RDW: 16.4 % — ABNORMAL HIGH (ref 11.5–15.5)
RDW: 16.4 % — ABNORMAL HIGH (ref 11.5–15.5)
RDW: 16.3 % — ABNORMAL HIGH (ref 11.5–15.5)
RDW: 16.3 % — ABNORMAL HIGH (ref 11.5–15.5)
RDW: 16.2 % — ABNORMAL HIGH (ref 11.5–15.5)
WBC: 6.8 10*3/uL (ref 4.0–10.5)
WBC: 6.3 10*3/uL (ref 4.0–10.5)
WBC: 6.3 10*3/uL (ref 4.0–10.5)
WBC: 5.8 10*3/uL (ref 4.0–10.5)
WBC: 6.2 10*3/uL (ref 4.0–10.5)
WBC: 6.6 10*3/uL (ref 4.0–10.5)
nRBC: 0 % (ref 0.0–0.2)
nRBC: 0 % (ref 0.0–0.2)
nRBC: 0.3 % — ABNORMAL HIGH (ref 0.0–0.2)
nRBC: 0 % (ref 0.0–0.2)
nRBC: 0 % (ref 0.0–0.2)
nRBC: 0 % (ref 0.0–0.2)

## 2020-10-20 LAB — TYPE AND SCREEN
ABO/RH(D): A POS
Antibody Screen: NEGATIVE
Unit division: 0
Unit division: 0
Unit division: 0

## 2020-10-20 LAB — HIV ANTIBODY (ROUTINE TESTING W REFLEX): HIV Screen 4th Generation wRfx: NONREACTIVE

## 2020-10-20 LAB — BPAM RBC
Blood Product Expiration Date: 202209282359
Blood Product Expiration Date: 202209282359
Blood Product Expiration Date: 202209282359
Unit Type and Rh: 5100
Unit Type and Rh: 5100
Unit Type and Rh: 5100

## 2020-10-20 LAB — GLUCOSE, CAPILLARY
Glucose-Capillary: 88 mg/dL (ref 70–99)
Glucose-Capillary: 103 mg/dL — ABNORMAL HIGH (ref 70–99)

## 2020-10-20 LAB — PROTIME-INR
INR: 1.3 — ABNORMAL HIGH (ref 0.8–1.2)
Prothrombin Time: 16.1 seconds — ABNORMAL HIGH (ref 11.4–15.2)

## 2020-10-20 LAB — BRAIN NATRIURETIC PEPTIDE: B Natriuretic Peptide: 32.6 pg/mL (ref 0.0–100.0)

## 2020-10-20 LAB — APTT: aPTT: 27 seconds (ref 24–36)

## 2020-10-20 LAB — PREPARE RBC (CROSSMATCH)

## 2020-10-20 MED ORDER — VITAMIN B-12 1000 MCG PO TABS
1000.0000 ug | ORAL_TABLET | Freq: Every day | ORAL | Status: DC
  Administered 2020-10-20 – 2020-10-24 (×5): 1000 ug via ORAL
  Filled 2020-10-20 (×5): qty 1

## 2020-10-20 MED ORDER — ONDANSETRON HCL 4 MG/2ML IJ SOLN
4.0000 mg | Freq: Once | INTRAMUSCULAR | Status: AC
  Administered 2020-10-20: 4 mg via INTRAVENOUS
  Filled 2020-10-20: qty 2

## 2020-10-20 MED ORDER — BISACODYL 5 MG PO TBEC
10.0000 mg | DELAYED_RELEASE_TABLET | Freq: Once | ORAL | Status: AC
  Administered 2020-10-20: 10 mg via ORAL
  Filled 2020-10-20: qty 2

## 2020-10-20 MED ORDER — LORAZEPAM 2 MG/ML IJ SOLN
1.0000 mg | Freq: Once | INTRAMUSCULAR | Status: AC
  Administered 2020-10-20: 1 mg via INTRAVENOUS
  Filled 2020-10-20: qty 1

## 2020-10-20 MED ORDER — LORAZEPAM 2 MG/ML IJ SOLN
0.5000 mg | Freq: Once | INTRAMUSCULAR | Status: AC
  Administered 2020-10-20: 0.5 mg via INTRAVENOUS
  Filled 2020-10-20: qty 1

## 2020-10-20 MED ORDER — ACETAMINOPHEN 500 MG PO TABS
1000.0000 mg | ORAL_TABLET | Freq: Once | ORAL | Status: AC
  Administered 2020-10-20: 1000 mg via ORAL
  Filled 2020-10-20: qty 2

## 2020-10-20 MED ORDER — LORAZEPAM 1 MG PO TABS
1.0000 mg | ORAL_TABLET | Freq: Two times a day (BID) | ORAL | Status: DC
  Administered 2020-10-20 – 2020-10-24 (×8): 1 mg via ORAL
  Filled 2020-10-20 (×8): qty 1

## 2020-10-20 MED ORDER — ONDANSETRON HCL 4 MG/2ML IJ SOLN
4.0000 mg | INTRAMUSCULAR | Status: DC | PRN
  Administered 2020-10-20 – 2020-10-23 (×5): 4 mg via INTRAVENOUS
  Filled 2020-10-20 (×6): qty 2

## 2020-10-20 MED ORDER — INSULIN ASPART 100 UNIT/ML IJ SOLN: 0.0000 [IU] | Freq: Every day | INTRAMUSCULAR | Status: DC

## 2020-10-20 MED ORDER — HYDROXYZINE HCL 50 MG/ML IM SOLN
25.0000 mg | Freq: Four times a day (QID) | INTRAMUSCULAR | Status: DC | PRN
  Administered 2020-10-20: 25 mg via INTRAMUSCULAR
  Filled 2020-10-20 (×3): qty 0.5

## 2020-10-20 MED ORDER — MORPHINE SULFATE (PF) 4 MG/ML IV SOLN
4.0000 mg | Freq: Once | INTRAVENOUS | Status: AC
Start: 2020-10-20 — End: 2020-10-20
  Administered 2020-10-20: 4 mg via INTRAVENOUS
  Filled 2020-10-20: qty 1

## 2020-10-20 MED ORDER — FERROUS SULFATE 325 (65 FE) MG PO TABS
325.0000 mg | ORAL_TABLET | Freq: Every day | ORAL | Status: DC
  Administered 2020-10-20 – 2020-10-21 (×2): 325 mg via ORAL
  Filled 2020-10-20 (×2): qty 1

## 2020-10-20 MED ORDER — ACETAMINOPHEN 325 MG PO TABS
650.0000 mg | ORAL_TABLET | Freq: Four times a day (QID) | ORAL | Status: DC | PRN
Start: 2020-10-20 — End: 2020-10-24
  Administered 2020-10-21 – 2020-10-23 (×2): 650 mg via ORAL
  Filled 2020-10-20 (×2): qty 2

## 2020-10-20 MED ORDER — MORPHINE SULFATE (PF) 4 MG/ML IV SOLN
4.0000 mg | Freq: Once | INTRAVENOUS | Status: AC
  Administered 2020-10-20: 4 mg via INTRAVENOUS
  Filled 2020-10-20: qty 1

## 2020-10-20 MED ORDER — TRAMADOL-ACETAMINOPHEN 37.5-325 MG PO TABS
2.0000 | ORAL_TABLET | Freq: Two times a day (BID) | ORAL | Status: DC | PRN
Start: 2020-10-20 — End: 2020-10-24
  Administered 2020-10-20 – 2020-10-23 (×5): 2 via ORAL
  Filled 2020-10-20 (×9): qty 2

## 2020-10-20 MED ORDER — METOPROLOL SUCCINATE ER 50 MG PO TB24
50.0000 mg | ORAL_TABLET | Freq: Every day | ORAL | Status: DC
  Administered 2020-10-20 – 2020-10-24 (×5): 50 mg via ORAL
  Filled 2020-10-20 (×5): qty 1

## 2020-10-20 MED ORDER — INSULIN ASPART 100 UNIT/ML IJ SOLN
0.0000 [IU] | Freq: Three times a day (TID) | INTRAMUSCULAR | Status: DC
  Filled 2020-10-20: qty 1

## 2020-10-20 MED ORDER — CYCLOBENZAPRINE HCL 10 MG PO TABS
5.0000 mg | ORAL_TABLET | Freq: Three times a day (TID) | ORAL | Status: DC | PRN
  Filled 2020-10-20: qty 1

## 2020-10-20 MED ORDER — MAGNESIUM OXIDE -MG SUPPLEMENT 400 (240 MG) MG PO TABS
800.0000 mg | ORAL_TABLET | Freq: Two times a day (BID) | ORAL | Status: DC
  Administered 2020-10-20 – 2020-10-24 (×8): 800 mg via ORAL
  Filled 2020-10-20 (×8): qty 2

## 2020-10-20 MED ORDER — SODIUM CHLORIDE 0.9 % IV SOLN: INTRAVENOUS | Status: DC

## 2020-10-20 MED ORDER — AMIODARONE HCL 200 MG PO TABS
200.0000 mg | ORAL_TABLET | Freq: Every day | ORAL | Status: DC
  Administered 2020-10-20 – 2020-10-21 (×2): 200 mg via ORAL
  Filled 2020-10-20 (×2): qty 1

## 2020-10-20 MED ORDER — HYDRALAZINE HCL 20 MG/ML IJ SOLN: 5.0000 mg | INTRAMUSCULAR | Status: DC | PRN

## 2020-10-20 MED ORDER — POLYETHYLENE GLYCOL 3350 17 GM/SCOOP PO POWD
1.0000 | Freq: Once | ORAL | Status: AC
  Administered 2020-10-20: 255 g via ORAL
  Filled 2020-10-20 (×3): qty 255

## 2020-10-20 NOTE — Progress Notes (Signed)
GI Inpatient Follow-up Note  Subjective:  Patient seen in follow-up for hematochezia/lower GI bleed. He was not transferred as no bed was available. No acute events overnight. Since our last encounter yesterday afternoon, he reports had three more episodes of hematochezia with clots with last episode occurring around 9 PM. He has had no further episodes of hematochezia since last night. He had repeat bleeding scan this morning which was negative. H&H have remained stable. He is still having some left lower quadrant tenderness. No new complaints. Sister at bedside.   Scheduled Inpatient Medications:   amiodarone  200 mg Oral Daily   ferrous sulfate  325 mg Oral Daily   insulin aspart  0-5 Units Subcutaneous QHS   insulin aspart  0-9 Units Subcutaneous TID WC   iohexol  1,000 mL Oral Once   LORazepam  1 mg Oral BID   magnesium oxide  800 mg Oral BID   metoprolol succinate  50 mg Oral Daily   cyanocobalamin  1,000 mcg Oral Daily    Continuous Inpatient Infusions:    sodium chloride Stopped (10/19/20 1540)    PRN Inpatient Medications:  acetaminophen, cyclobenzaprine, hydrALAZINE, hydrOXYzine, iohexol, traMADol-acetaminophen  Review of Systems: Constitutional: Weight is stable.  Eyes: No changes in vision. ENT: No oral lesions, sore throat.  GI: see HPI.  Heme/Lymph: No easy bruising.  CV: No chest pain.  GU: No hematuria.  Integumentary: No rashes.  Neuro: No headaches.  Psych: No depression/anxiety.  Endocrine: No heat/cold intolerance.  Allergic/Immunologic: No urticaria.  Resp: No cough, SOB.  Musculoskeletal: No joint swelling.    Physical Examination: BP (!) 116/57 (BP Location: Right Arm)   Pulse (!) 102   Temp 98.2 F (36.8 C)   Resp 16   Ht 6' (1.829 m)   Wt (!) 245.4 kg   SpO2 94%   BMI 73.37 kg/m  Gen: NAD, alert and oriented x 4 HEENT: PEERLA, EOMI, Neck: supple, no JVD or thyromegaly Chest: CTA bilaterally, no wheezes, crackles, or other adventitious  sounds CV: RRR, no m/g/c/r Abd: soft, NT, ND, +BS in all four quadrants; no HSM, guarding, ridigity, or rebound tenderness Ext: no edema, well perfused with 2+ pulses, Skin: no rash or lesions noted Lymph: no LAD  Data: Lab Results  Component Value Date   WBC 5.8 10/20/2020   HGB 10.2 (L) 10/20/2020   HCT 33.1 (L) 10/20/2020   MCV 91.2 10/20/2020   PLT 173 10/20/2020   Recent Labs  Lab 10/20/20 0643 10/20/20 1110 10/20/20 1447  HGB 10.0* 10.4* 10.2*   Lab Results  Component Value Date   NA 137 10/19/2020   K 4.1 10/19/2020   CL 101 10/19/2020   CO2 25 10/19/2020   BUN 21 (H) 10/19/2020   CREATININE 0.67 10/19/2020   Lab Results  Component Value Date   ALT 16 10/19/2020   AST 17 10/19/2020   ALKPHOS 85 10/19/2020   BILITOT 1.0 10/19/2020   Recent Labs  Lab 10/20/20 0643 10/20/20 1447  APTT  --  27  INR 1.3*  --    Summary of GI Procedures:   CSY 09/2016 (Dr. Vira Agar) - Three small polyps at the recto-sigmoid colon, in the transverse colon and at the hepatic flexure, removed with a hot snare. Resected and retrieved. Clips were placed. - Internal hemorrhoids. - The examination was otherwise normal. - Path: SSA, TA, and hyperplastic polyp   EGD 09/2016 (Dr. Vira Agar) - LA Grade A reflux esophagitis. - Normal stomach. - Normal examined duodenum. -  No specimens collected.  Assessment/Plan:  50 y/o Caucasian male with a PMH of HTN, anxiety, depression, T2DM, CHF, atrial fibrillation with RVR, hx of aortic valve replacement on chronic anticoagulation with Coumadin, morbid obesity (BMI 73.37), tobacco abuse, and OSA presented to the [facility] ED via EMS from home this morning for hematochezia   Hematochezia/Lower GI bleed   2.   History of adenomatous colon polyps   3.   History of aortic valve replacement on Warfarin   4.   Morbid obesity - BMI 73.37  Recommendations:  - H&H remain stable with no overt hematochezia - Continue to monitor serial H&H and  transfuse for hemoglobin <7.0 - Colonoscopy tomorrow for endoluminal evaluation. Clinical presentation is consistent with a diverticular bleed and we reviewed that his bleeding could have spontaneously stopped and we might not be able to identify the location. Endoscopic hemostasis could be a possibility as well.  - Clear liquid diet today. NPO after midnight.  - See colonoscopy procedure note for findings and further recommendations  I reviewed the risks (including bleeding, perforation, infection, anesthesia complications, cardiac/respiratory complications), benefits and alternatives of colonoscopy. Patient consents to proceed.    Please call with questions or concerns.    Octavia Bruckner, PA-C [facility] [phone] (Cell)

## 2020-10-20 NOTE — ED Provider Notes (Signed)
Repeat nuclear medicine study demonstrates no significant active bleeding.  Spoke with Dr. Allen Norris of GI and have admitted to the hospital service   Lavonia Drafts, MD 10/20/20 1215

## 2020-10-20 NOTE — ED Notes (Signed)
Informed RN bed assigned 

## 2020-10-20 NOTE — H&P (Signed)
History and Physical    Russell Ray [id number] DOB: October 11, 1970 DOA: 10/19/2020  Referring MD/NP/PA:   PCP: [name], MD   Patient coming from:  The patient is coming from home.  At baseline, pt is independent for most of ADL.        Chief Complaint: rectal bleeding  HPI: Russell Ray is a 50 y.o. male with medical history significant of s/p of mechanical AVR 04/26/15 on coumadin, a fib, HTN, DM, depression with anxiety, dCHF, anemia, former smoker, OSA, morbid obesity with BMI 73.37, who presents with rectal bleeding.  Pt states that his rectal bleeding started at 11 PM Tuesday night.  He has bright red-colored rectal bleeding. He also reports mild left lower quadrant abdominal pain.  Currently no nausea vomiting.  Patient reports mild lightheadedness.  No active chest pain, shortness breath.  No fever or chills.  Pt had NM blood loss scan yesterday which was positive for active bleeding within the left colon. He has INR of 3.1.  Patient was seen by both cardiology and GI with recommendation for reversing his anticoagulation that has been done. Pt was given 10 mg of vitamin K and Kcentra. His INR now down to 1.3. Pt states that he does not have rectal bleeding anymore today. The repeated nuclear scan showed  no significant active bleeding today.  Per EDP, pt was accepted by [facility] for transfer, but no bed available yet.   ED Course: pt was found to have hemoglobin 11.3 on 05/13/2017 --> 10.9 --> 10.0.  WBC 6.3, troponin level 8, lactic acid of 1.4, negative COVID PCR, electrolytes renal function okay, temperature normal, blood pressure 106/77, heart rate 121, 116, RR 22, oxygen saturation 87-95% on room air.  Chest x-ray is negative for infiltration.  Patient is admitted to progressive bed as inpatient.  Dr. Allen Norris GI is consulted. Dr. Humphrey Rolls of card is consulted.    Review of Systems:   General: no fevers, chills, no body weight gain, has poor appetite, has  fatigue HEENT: no blurry vision, hearing changes or sore throat Respiratory: no dyspnea, coughing, wheezing CV: no chest pain, no palpitations GI: no nausea, vomiting, abdominal pain, diarrhea, constipation GU: no dysuria, burning on urination, increased urinary frequency, hematuria  Ext: no leg edema Neuro: no unilateral weakness, numbness, or tingling, no vision change or hearing loss Skin: no rash, no skin tear. MSK: No muscle spasm, no deformity, no limitation of range of movement in spin Heme: No easy bruising.  Travel history: No recent long distant travel.  Allergy: No Known Allergies  Past Medical History:  Diagnosis Date   Anxiety    Aortic valve stenosis    Atrial fibrillation (HCC)    CHF (congestive heart failure) (HCC)    Depression    Diabetes mellitus without complication ([place])    Hypertension     Past Surgical History:  Procedure Laterality Date   APPLICATION OF WOUND VAC     CARDIAC CATHETERIZATION Right 04/22/2015   Procedure: Right/Left Heart Cath and Coronary Angiography;  Surgeon: Dionisio David, MD;  Location: [place] [facility];  Service: Cardiovascular;  Laterality: Right;   CARDIAC VALVE REPLACEMENT     CARDIOVERSION N/A 05/14/2017   Procedure: CARDIOVERSION;  Surgeon: Dionisio David, MD;  Location: [facility] ORS;  Service: Cardiovascular;  Laterality: N/A;   COLONOSCOPY WITH PROPOFOL N/A 10/05/2016   Procedure: COLONOSCOPY WITH PROPOFOL;  Surgeon: Manya Silvas, MD;  Location: [facility] ENDOSCOPY;  Service: Endoscopy;  Laterality:  N/A;   debridement sternum     ELECTROPHYSIOLOGIC STUDY N/A 05/05/2015   Procedure: Cardioversion;  Surgeon: Dionisio David, MD;  Location: [facility] ORS;  Service: Cardiovascular;  Laterality: N/A;   ELECTROPHYSIOLOGIC STUDY N/A 05/17/2015   Procedure: CARDIOVERSION;  Surgeon: Dionisio David, MD;  Location: [facility] ORS;  Service: Cardiovascular;  Laterality: N/A;   ESOPHAGOGASTRODUODENOSCOPY (EGD) WITH PROPOFOL N/A 10/05/2016   Procedure:  ESOPHAGOGASTRODUODENOSCOPY (EGD) WITH PROPOFOL;  Surgeon: Manya Silvas, MD;  Location: [facility] ENDOSCOPY;  Service: Endoscopy;  Laterality: N/A;   GREATER OMENTAL FLAP CLOSURE     HERNIA REPAIR     X3   VALVE REPLACEMENT     VENTRAL HERNIA REPAIR N/A 12/01/2016   Procedure: HERNIA REPAIR VENTRAL ADULT with mesh;  Surgeon: Jules Husbands, MD;  Location: [facility] ORS;  Service: General;  Laterality: N/A;    Social History:  reports that he has quit smoking. His smoking use included cigarettes. He has a 56.00 pack-year smoking history. He has never used smokeless tobacco. He reports that he does not drink alcohol and does not use drugs.  Family History:  Family History  Problem Relation Age of Onset   Hypertension Mother    Hypertension Maternal Grandmother    Diabetes Maternal Grandmother      Prior to Admission medications   Medication Sig Start Date End Date Taking? Authorizing Provider  acetaminophen (TYLENOL) 325 MG tablet Take 650 mg by mouth every 4 (four) hours as needed for moderate pain.   Yes [provider]  amiodarone (PACERONE) 200 MG tablet 2 tablets twice a day for 5 days then 1 tablet twice a day for 5 days then one tablet daily afterwards Patient taking differently: Take 400 mg by mouth daily. 05/15/17  Yes Wieting, Richard, MD  aspirin EC 81 MG tablet Take 81 mg by mouth daily.    Yes [provider]  cyanocobalamin 1000 MCG tablet Take 1,000 mcg by mouth daily.   Yes [provider]  cyclobenzaprine (FLEXERIL) 5 MG tablet Take 5 mg by mouth 3 (three) times daily as needed for muscle spasms.    Yes [provider]  Iron, Ferrous Sulfate, 142 (45 Fe) MG TBCR Take 1 tablet by mouth daily.   Yes [provider]  lisinopril (PRINIVIL,ZESTRIL) 20 MG tablet Take 20 mg by mouth daily.   Yes [provider]  LORazepam (ATIVAN) 1 MG tablet Take 1 mg by mouth 2 (two) times daily.    Yes [provider]  Magnesium Oxide 400  (240 Mg) MG TABS Take 1 tablet (400 mg total) by mouth every morning. Patient taking differently: Take 800 mg by mouth 2 (two) times daily. 05/16/17  Yes Wieting, Richard, MD  metoprolol succinate (TOPROL-XL) 50 MG 24 hr tablet Take 50 mg by mouth daily. Take with or immediately following a meal.   Yes [provider]  ondansetron (ZOFRAN) 4 MG tablet Take 4 mg by mouth 2 (two) times daily.   Yes [provider]  pioglitazone (ACTOS) 45 MG tablet Take 45 mg by mouth daily.   Yes [provider]  traMADol-acetaminophen (ULTRACET) 37.5-325 MG tablet Take 2 tablets by mouth 2 (two) times daily as needed for moderate pain.   Yes [provider]  warfarin (COUMADIN) 2 MG tablet Take 2 mg by mouth every other day.   Yes [provider]  warfarin (COUMADIN) 3 MG tablet Take 3 mg by mouth every other day.   Yes [provider]  Physical Exam: Vitals:   10/20/20 0800 10/20/20 1107 10/20/20 1108 10/20/20 1300  BP: 106/72 129/71  122/66  Pulse: (!) 103 (!) 118 (!) 116 (!) 119  Resp: '17 16 20 16  '$ Temp:      TempSrc:      SpO2: 92% 95% 95% 98%  Weight:      Height:       General: Not in acute distress HEENT:       Eyes: PERRL, EOMI, no scleral icterus.       ENT: No discharge from the ears and nose, no pharynx injection, no tonsillar enlargement.        Neck: No JVD, no bruit, no mass felt. Heme: No neck lymph node enlargement. Cardiac: S1/S2, RRR, No murmurs, No gallops or rubs. Respiratory: No rales, wheezing, rhonchi or rubs. GI: Soft, nondistended, nontender, no rebound pain, no organomegaly, BS present. GU: No hematuria Ext: No pitting leg edema bilaterally. 1+DP/PT pulse bilaterally. Musculoskeletal: No joint deformities, No joint redness or warmth, no limitation of ROM in spin. Skin: No rashes.  Neuro: Alert, oriented X3, cranial nerves II-XII grossly intact, moves all extremities normally. Muscle strength 5/5 in all extremities,  sensation to light touch intact. Brachial reflex 2+ bilaterally. Knee reflex 1+ bilaterally. Negative Babinski's sign. Normal finger to nose test. Psych: Patient is not psychotic, no suicidal or hemocidal ideation.  Labs on Admission: I have personally reviewed following labs and imaging studies  CBC: Recent Labs  Lab 10/19/20 1935 10/19/20 2325 10/20/20 0249 10/20/20 0643 10/20/20 1110  WBC 6.8 6.8 6.8 6.3 6.3  NEUTROABS 5.3 5.0 4.8 4.7 4.9  HGB 10.1* 10.3* 10.2* 10.0* 10.4*  HCT 32.4* 33.0* 33.2* 32.2* 33.9*  MCV 93.6 93.0 92.5 91.5 91.4  PLT 191 184 184 175 [id number]   Basic Metabolic Panel: Recent Labs  Lab 10/19/20 0737  NA 137  K 4.1  CL 101  CO2 25  GLUCOSE 117*  BUN 21*  CREATININE 0.67  CALCIUM 9.8   GFR: Estimated Creatinine Clearance: 231.1 mL/min (by C-G formula based on SCr of 0.67 mg/dL). Liver Function Tests: Recent Labs  Lab 10/19/20 0737  AST 17  ALT 16  ALKPHOS 85  BILITOT 1.0  PROT 6.8  ALBUMIN 3.5   Recent Labs  Lab 10/19/20 0737  LIPASE 23   No results for input(s): AMMONIA in the last 168 hours. Coagulation Profile: Recent Labs  Lab 10/19/20 0737 10/19/20 1935 10/20/20 0643  INR 3.1* 1.6* 1.3*   Cardiac Enzymes: No results for input(s): CKTOTAL, CKMB, CKMBINDEX, TROPONINI in the last 168 hours. BNP (last 3 results) No results for input(s): PROBNP in the last 8760 hours. HbA1C: No results for input(s): HGBA1C in the last 72 hours. CBG: No results for input(s): GLUCAP in the last 168 hours. Lipid Profile: No results for input(s): CHOL, HDL, LDLCALC, TRIG, CHOLHDL, LDLDIRECT in the last 72 hours. Thyroid Function Tests: No results for input(s): TSH, T4TOTAL, FREET4, T3FREE, THYROIDAB in the last 72 hours. Anemia Panel: No results for input(s): VITAMINB12, FOLATE, FERRITIN, TIBC, IRON, RETICCTPCT in the last 72 hours. Urine analysis:    Component Value Date/Time   COLORURINE STRAW (A) 03/19/2015 0440   APPEARANCEUR CLEAR (A)  03/19/2015 0440   LABSPEC 1.008 03/19/2015 0440   PHURINE 6.0 03/19/2015 0440   GLUCOSEU NEGATIVE 03/19/2015 0440   HGBUR NEGATIVE 03/19/2015 0440   BILIRUBINUR NEGATIVE 03/19/2015 0440   KETONESUR NEGATIVE 03/19/2015 0440   PROTEINUR NEGATIVE 03/19/2015 0440   NITRITE NEGATIVE 03/19/2015 0440  LEUKOCYTESUR NEGATIVE 03/19/2015 0440   Sepsis Labs: '@LABRCNTIP'$ (procalcitonin:4,lacticidven:4) ) Recent Results (from the past 240 hour(s))  Resp Panel by RT-PCR (Flu A&B, Covid) Nasopharyngeal Swab     Status: None   Collection Time: 10/19/20  3:38 PM   Specimen: Nasopharyngeal Swab; Nasopharyngeal(NP) swabs in vial transport medium  Result Value Ref Range Status   SARS Coronavirus 2 by RT PCR NEGATIVE NEGATIVE Final    Comment: (NOTE) SARS-CoV-2 target nucleic acids are NOT DETECTED.  The SARS-CoV-2 RNA is generally detectable in upper respiratory specimens during the acute phase of infection. The lowest concentration of SARS-CoV-2 viral copies this assay can detect is 138 copies/mL. A negative result does not preclude SARS-Cov-2 infection and should not be used as the sole basis for treatment or other patient management decisions. A negative result may occur with  improper specimen collection/handling, submission of specimen other than nasopharyngeal swab, presence of viral mutation(s) within the areas targeted by this assay, and inadequate number of viral copies(<138 copies/mL). A negative result must be combined with clinical observations, patient history, and epidemiological information. The expected result is Negative.  Fact Sheet for Patients:  EntrepreneurPulse.com.au  Fact Sheet for Healthcare Providers:  IncredibleEmployment.be  This test is no t yet approved or cleared by the [place] FDA and  has been authorized for detection and/or diagnosis of SARS-CoV-2 by FDA under an Emergency Use Authorization (EUA). This EUA will remain   in effect (meaning this test can be used) for the duration of the COVID-19 declaration under Section 564(b)(1) of the Act, 21 U.S.C.section 360bbb-3(b)(1), unless the authorization is terminated  or revoked sooner.       Influenza A by PCR NEGATIVE NEGATIVE Final   Influenza B by PCR NEGATIVE NEGATIVE Final    Comment: (NOTE) The Xpert Xpress SARS-CoV-2/FLU/RSV plus assay is intended as an aid in the diagnosis of influenza from Nasopharyngeal swab specimens and should not be used as a sole basis for treatment. Nasal washings and aspirates are unacceptable for Xpert Xpress SARS-CoV-2/FLU/RSV testing.  Fact Sheet for Patients: EntrepreneurPulse.com.au  Fact Sheet for Healthcare Providers: IncredibleEmployment.be  This test is not yet approved or cleared by the [place] FDA and has been authorized for detection and/or diagnosis of SARS-CoV-2 by FDA under an Emergency Use Authorization (EUA). This EUA will remain in effect (meaning this test can be used) for the duration of the COVID-19 declaration under Section 564(b)(1) of the Act, 21 U.S.C. section 360bbb-3(b)(1), unless the authorization is terminated or revoked.  Performed at [facility], [address] [place], [place] [address]      Radiological Exams on Admission: NM GI Blood Loss  Addendum Date: 10/20/2020   ADDENDUM REPORT: 10/20/2020 10:54 ADDENDUM: Patient returned this morning for delayed imaging to assess for continued active bleeding. Delayed imaging was carried out for an additional 2 hours this morning. Expected physiologic blood pool distribution of the radiopharmaceutical is identified. Within the left upper quadrant of the abdomen there is a elongated focus radiotracer activity which conforms to the expected distribution of the distal transverse colon. Over 2 hours of imaging this does not appear to increase intensity nor does this move in an antegrade fashion  to suggest active bleeding. Similarly, there is a elongated focus of increased uptake within the left lower quadrant of the abdomen in the same distribution of the active bleeding from the prior day. This does not appear to significantly increase in intensity over time nor desist moving in antegrade fashion to suggest active  bleeding. A new area of increased radiotracer uptake is identified within the central portion of the upper abdomen along the medial margin of the liver. This may represent activity within the distal stomach or proximal duodenum. This does not appear to increase in intensity nor does this move in an antegrade fashion suggest active bleeding. 1. 24 hour delayed imaging was performed to confirm absence of ongoing GI bleeding. As detailed above there are several areas of radiotracer accumulation within the abdomen. These foci appear static, do not increase in intensity over time and do not move in an antegrade fashion conforming to the expected distribution of bowel loops. Assuming the patient is hemodynamically stable and is no longer dropping hemoglobin/hematocrit today's findings are favored to represent sequelae of prior active bleeding. Electronically Signed   By: Kerby Moors M.D.   On: 10/20/2020 10:54   Result Date: 10/20/2020 CLINICAL DATA:  Evaluate for GI bleed. EXAM: NUCLEAR MEDICINE GASTROINTESTINAL BLEEDING SCAN TECHNIQUE: Sequential abdominal images were obtained following intravenous administration of Tc-16mlabeled red blood cells. RADIOPHARMACEUTICALS:  30.38 mCi Tc-942mertechnetate in-vitro labeled red cells. COMPARISON:  None. FINDINGS: Abnormal intraluminal radiotracer activity is identified within the left lower quadrant of the abdomen. This has a tubular configuration which increases in intensity over time and progresses in a antegrade distribution. Intraluminal radiotracer activity is favored to originate from the descending colon (which is excluded from the field of  view due to patient's body habitus) or proximal sigmoid colon. IMPRESSION: Examination is positive for active GI bleed. Bowel activity is most intense within the left lower quadrant of the abdomen consistent with active bleeding within the left colon. These results were called by telephone at the time of interpretation on 10/19/2020 at 3:27 pm to [facility] who verbally acknowledged these results. Electronically Signed: By: TaKerby Moors.D. On: 10/19/2020 15:28   DG Chest Portable 1 View  Result Date: 10/19/2020 CLINICAL DATA:  [age]ear old male with shortness of breath. Left lower quadrant pain. Bright red blood per rectum. EXAM: PORTABLE CHEST 1 VIEW COMPARISON:  Portable chest 05/13/2017 and earlier. FINDINGS: Portable AP semi upright view at 0805 hours. Chronically low lung volumes. Normal cardiac size and mediastinal contours. Visualized tracheal air column is within normal limits. Allowing for portable technique the lungs are clear. No pneumothorax or pleural effusion. No acute osseous abnormality identified. IMPRESSION: Low lung volumes.  No acute cardiopulmonary abnormality. Electronically Signed   By: H Genevie Ann.D.   On: 10/19/2020 08:28     EKG: I have personally reviewed.  Atrial fibrillation, QTC 506, low voltage, right bundle blockade    Assessment/Plan Principal Problem:   Lower GI bleeding Active Problems:   Morbid obesity (HCC)   Obstructive sleep apnea   Iron deficiency anemia   H/O aortic valve replacement   Atrial fibrillation (HCC)   Anxiety   Chronic diastolic CHF (congestive heart failure) (HCC)   Essential (primary) hypertension   Type 2 diabetes mellitus without complication (HCC)   Anemia due to blood loss   Lower GI bleeding: Hgb stable, 11.3 --> 10.9 --> 10.0 -->10.2. The repeated nuclear scan showed no significant active bleeding today. [facility]. Dr. ToAlice Reichertf GI is consulted -->  plan to do colonoscopy morning.  - will admitted to progressive bed as inpatient - continue to hold coumadin - IVF: 75 mL/hr --> stopped - Avoid NSAIDs and SQ heparin - Maintain IV access (2 large  bore IVs if possible). - Monitor closely and follow q6h cbc, transfuse as necessary, if Hgb<7.0 - LaB: INR, PTT and type screen -NPO after MN f  Iron deficiency anemia and anemia due to blood loss -continue iron supplement  Morbid obesity ([place]): BMI 73.37 -Healthy diet -Exercise  H/O aortic valve replacement: Mechanical valve replacement 04/26/2015.  The goal for INR 2-3. -Hold Coumadin -Dr. Humphrey Rolls of card is consulted  Atrial fibrillation ([place]) -Hold Coumadin -Amiodarone, metoprolol  Anxiety -Continue home Ativan 1 mg twice daily  Chronic diastolic CHF (congestive heart failure) ([place]): BNP 52.4.  Patient has 1+ leg edema, but no pulmonary edema on chest x-ray.  CHF seem to be compensated.  2D echo 11/12/2018 showed EF 60-35%.  Patient is not taking diuretics currently -Watch volume status closely  Essential (primary) hypertension -Hold lisinopril due to GI bleeding -Continue metoprolol  Type 2 diabetes mellitus without complication [facility]): Patient is taking Actos.  A1c 6.2 in 2017, well controlled at that time -Sliding scale insulin -check A1c      DVT ppx: SCD Code Status: Full code Family Communication: Patient does not want [place] to call family. Disposition Plan:  Anticipate discharge back to previous environment Consults called:  Dr. Alice Reichert of GI is consulted. Dr. Humphrey Rolls of card is consulted. Admission status and Level of care:     progressive unit   as inpt        Status is: Inpatient  Remains inpatient appropriate because:Inpatient level of care appropriate due to severity of illness  Dispo: The patient is from: Home              Anticipated d/c is to: to be determined              Patient currently is not medically stable to d/c.   Difficult to place patient  No          Date of Service 10/20/2020    Ivor Costa [facility]   If 7PM-7AM, please contact night-coverage [email or web address] 10/20/2020, 1:50 PM

## 2020-10-20 NOTE — ED Notes (Signed)
Patient talking on phone in bed at this time. No needs expressed to RN.

## 2020-10-20 NOTE — H&P (View-Only) (Signed)
GI Inpatient Follow-up Note  Subjective:  Patient seen in follow-up for hematochezia/lower GI bleed. He was not transferred as no bed was available. No acute events overnight. Since our last encounter yesterday afternoon, he reports had three more episodes of hematochezia with clots with last episode occurring around 9 PM. He has had no further episodes of hematochezia since last night. He had repeat bleeding scan this morning which was negative. H&H have remained stable. He is still having some left lower quadrant tenderness. No new complaints. Sister at bedside.   Scheduled Inpatient Medications:   amiodarone  200 mg Oral Daily   ferrous sulfate  325 mg Oral Daily   insulin aspart  0-5 Units Subcutaneous QHS   insulin aspart  0-9 Units Subcutaneous TID WC   iohexol  1,000 mL Oral Once   LORazepam  1 mg Oral BID   magnesium oxide  800 mg Oral BID   metoprolol succinate  50 mg Oral Daily   cyanocobalamin  1,000 mcg Oral Daily    Continuous Inpatient Infusions:    sodium chloride Stopped (10/19/20 1540)    PRN Inpatient Medications:  acetaminophen, cyclobenzaprine, hydrALAZINE, hydrOXYzine, iohexol, traMADol-acetaminophen  Review of Systems: Constitutional: Weight is stable.  Eyes: No changes in vision. ENT: No oral lesions, sore throat.  GI: see HPI.  Heme/Lymph: No easy bruising.  CV: No chest pain.  GU: No hematuria.  Integumentary: No rashes.  Neuro: No headaches.  Psych: No depression/anxiety.  Endocrine: No heat/cold intolerance.  Allergic/Immunologic: No urticaria.  Resp: No cough, SOB.  Musculoskeletal: No joint swelling.    Physical Examination: BP (!) 116/57 (BP Location: Right Arm)   Pulse (!) 102   Temp 98.2 F (36.8 C)   Resp 16   Ht 6' (1.829 m)   Wt (!) 245.4 kg   SpO2 94%   BMI 73.37 kg/m  Gen: NAD, alert and oriented x 4 HEENT: PEERLA, EOMI, Neck: supple, no JVD or thyromegaly Chest: CTA bilaterally, no wheezes, crackles, or other adventitious  sounds CV: RRR, no m/g/c/r Abd: soft, NT, ND, +BS in all four quadrants; no HSM, guarding, ridigity, or rebound tenderness Ext: no edema, well perfused with 2+ pulses, Skin: no rash or lesions noted Lymph: no LAD  Data: Lab Results  Component Value Date   WBC 5.8 10/20/2020   HGB 10.2 (L) 10/20/2020   HCT 33.1 (L) 10/20/2020   MCV 91.2 10/20/2020   PLT 173 10/20/2020   Recent Labs  Lab 10/20/20 0643 10/20/20 1110 10/20/20 1447  HGB 10.0* 10.4* 10.2*   Lab Results  Component Value Date   NA 137 10/19/2020   K 4.1 10/19/2020   CL 101 10/19/2020   CO2 25 10/19/2020   BUN 21 (H) 10/19/2020   CREATININE 0.67 10/19/2020   Lab Results  Component Value Date   ALT 16 10/19/2020   AST 17 10/19/2020   ALKPHOS 85 10/19/2020   BILITOT 1.0 10/19/2020   Recent Labs  Lab 10/20/20 0643 10/20/20 1447  APTT  --  27  INR 1.3*  --    Summary of GI Procedures:   CSY 09/2016 (Dr. Vira Agar) - Three small polyps at the recto-sigmoid colon, in the transverse colon and at the hepatic flexure, removed with a hot snare. Resected and retrieved. Clips were placed. - Internal hemorrhoids. - The examination was otherwise normal. - Path: SSA, TA, and hyperplastic polyp   EGD 09/2016 (Dr. Vira Agar) - LA Grade A reflux esophagitis. - Normal stomach. - Normal examined duodenum. -  No specimens collected.  Assessment/Plan:  50 y/o Caucasian male with a PMH of HTN, anxiety, depression, T2DM, CHF, atrial fibrillation with RVR, hx of aortic valve replacement on chronic anticoagulation with Coumadin, morbid obesity (BMI 73.37), tobacco abuse, and OSA presented to the [facility] ED via EMS from home this morning for hematochezia   Hematochezia/Lower GI bleed   2.   History of adenomatous colon polyps   3.   History of aortic valve replacement on Warfarin   4.   Morbid obesity - BMI 73.37  Recommendations:  - H&H remain stable with no overt hematochezia - Continue to monitor serial H&H and  transfuse for hemoglobin <7.0 - Colonoscopy tomorrow for endoluminal evaluation. Clinical presentation is consistent with a diverticular bleed and we reviewed that his bleeding could have spontaneously stopped and we might not be able to identify the location. Endoscopic hemostasis could be a possibility as well.  - Clear liquid diet today. NPO after midnight.  - See colonoscopy procedure note for findings and further recommendations  I reviewed the risks (including bleeding, perforation, infection, anesthesia complications, cardiac/respiratory complications), benefits and alternatives of colonoscopy. Patient consents to proceed.    Please call with questions or concerns.    Octavia Bruckner, PA-C [facility] [phone] (Cell)

## 2020-10-20 NOTE — Plan of Care (Signed)

## 2020-10-20 NOTE — ED Provider Notes (Signed)
Serial hemoglobins have been stable throughout the night.  Plan is to repeat the nuclear medicine GI bleed study and if there is no active bleed patient will be admitted to our hospital.  Otherwise he will remain on wait list at [facility].  Care transferred to Dr. Forde Dandy, [place], MD 10/20/20 [phone]

## 2020-10-20 NOTE — Progress Notes (Signed)
   10/20/20 2009  Assess: MEWS Score  Temp 98.3 F (36.8 C)  BP 126/76  Pulse Rate (!) 115  Resp 16  SpO2 95 %  O2 Device Room Air  Assess: MEWS Score  MEWS Temp 0  MEWS Systolic 0  MEWS Pulse 2  MEWS RR 0  MEWS LOC 0  MEWS Score 2  MEWS Score Color Yellow  Assess: if the MEWS score is Yellow or Red  Were vital signs taken at a resting state? Yes  Focused Assessment Change from prior assessment (see assessment flowsheet)  Does the patient meet 2 or more of the SIRS criteria? No  MEWS guidelines implemented *See Row Information* Yes  Treat  MEWS Interventions Administered prn meds/treatments  Take Vital Signs  Increase Vital Sign Frequency  Yellow: Q 2hr X 2 then Q 4hr X 2, if remains yellow, continue Q 4hrs  Escalate  MEWS: Escalate Yellow: discuss with charge nurse/RN and consider discussing with provider and RRT  Notify: Charge Nurse/RN  Name of Charge Nurse/RN Company secretary, RN  Date Charge Nurse/RN Notified 10/20/20  Time Charge Nurse/RN Notified 2015  Assess: SIRS CRITERIA  SIRS Temperature  0  SIRS Pulse 1  SIRS Respirations  0  SIRS WBC 0  SIRS Score Sum  1

## 2020-10-20 NOTE — Progress Notes (Addendum)
MEDICATION RELATED CONSULT NOTE  Pharmacy Consult for INR monitoring post-Kcentra administration  No Known Allergies  Patient Measurements: Height: 6' (182.9 cm) Weight: (!) 245.4 kg (541 lb) IBW/kg (Calculated) : 77.6  Vital Signs: BP: 106/72 (08/25 0800) Pulse Rate: 103 (08/25 0800)   Labs: Recent Labs    10/19/20 0737 10/19/20 1538 10/19/20 2325 10/20/20 0249 10/20/20 0643  WBC 6.3   < > 6.8 6.8 6.3  HGB 10.9*   < > 10.3* 10.2* 10.0*  HCT 36.0*   < > 33.0* 33.2* 32.2*  PLT 194   < > 184 184 175  APTT 38*  --   --   --   --   CREATININE 0.67  --   --   --   --   ALBUMIN 3.5  --   --   --   --   PROT 6.8  --   --   --   --   AST 17  --   --   --   --   ALT 16  --   --   --   --   ALKPHOS 85  --   --   --   --   BILITOT 1.0  --   --   --   --    < > = values in this interval not displayed.    Estimated Creatinine Clearance: 231.1 mL/min (by C-G formula based on SCr of 0.67 mg/dL).   Medical History: Past Medical History:  Diagnosis Date   Anxiety    Aortic valve stenosis    Atrial fibrillation (HCC)    CHF (congestive heart failure) (HCC)    Depression    Diabetes mellitus without complication (HCC)    Hypertension      Assessment: 50 year old male presented with rectal bleeding. Patient has had several episodes of bloody bowel movements. He also had a fall several days prior to presentation with associated left-sided upper abdomen pain that has moved into lower quadrant. He is on warfarin for h/o aortic valve replacement. Nuclear medicine GI bleeding scan positive for hemorrhage in the left colon.   Patient received Kcentra and Vitamin K in the ED. Pharmacy to monitor INR post-Kcentra administration.  Last home warfarin dose '3mg'$  8/23 @ 1500  0824 0737 INR 3.1 0824 1728 '10mg'$  IV Vitamin K x 1 0824 1732 Kcentra 2182 units factor IX 0824 1935 INR 1.6 0825 0635 INR 1.3   HGB stable @ 10.0  Plan:  INR 1.6 post-Kcentra administration.  INR with am  labs 1.3 INR ordered for tomorrow am labs Continuing to monitor CBC q3h  Patient currently pending transfer.  Berta Minor, RPh Clinical Pharmacist 10/20/2020 10:00 AM

## 2020-10-20 NOTE — ED Notes (Addendum)
Patient to nuclear medicine at this time for scan. Will collect new CBC upon return from scan.

## 2020-10-20 NOTE — Consult Note (Signed)
Russell Ray is a 50 y.o. male  [id number]  Primary Cardiologist: Neoma Laming, MD Reason for Consultation: GI bleed on warfarin for mechanical valve  HPI: Russell Ray is an 50 y.o. male with history of atrial fibrillation, tobacco abuse, morbid obesity, obstructive sleep apnea, history of aortic valve replacement on chronic Coumadin therapy and type 2 diabetes who was seen in the ER with lower GI bleed.  Patient was hemodynamically stable but having active bleed.  This was confirmed on CTA abdomen that showed ongoing active bleed.  Patient was seen by both cardiology and GI with recommendation for reversing his anticoagulation that has been done.  INR now down to 1.6 from about 3.  Patient however will require evaluation by IR and possibly vascular.  Due to his weight status which is more than 500 such a procedure is not possible here or at [facility].  Contact has been established with a referral to [facility].  He has been accepted but no beds yet.  We have been consulted for medical management while patient is awaiting transfer.  He currently is hemodynamically stable.  His INR as indicated has been reversed.  He is complaining of pain about 8 out of 10 in his abdomen.  He is otherwise stable.   Review of Systems: denies chest pain, shortness of breath, dizziness. Reports no rectal bleeding this morning.    Past Medical History:  Diagnosis Date   Anxiety    Aortic valve stenosis    Atrial fibrillation (HCC)    CHF (congestive heart failure) (HCC)    Depression    Diabetes mellitus without complication ([place])    Hypertension     (Not in a hospital admission)     iohexol  1,000 mL Oral Once    Infusions:  sodium chloride Stopped (10/19/20 1540)    No Known Allergies  Social History   Socioeconomic History   Marital status: Single    Spouse name: Not on file   Number of children: Not on file   Years of education: Not on file   Highest education level: Not  on file  Occupational History   Occupation: Arboriculturist  Tobacco Use   Smoking status: Former    Packs/day: 2.00    Years: 28.00    Pack years: 56.00    Types: Cigarettes   Smokeless tobacco: Never  Vaping Use   Vaping Use: Never used  Substance and Sexual Activity   Alcohol use: No   Drug use: No   Sexual activity: Yes  Other Topics Concern   Not on file  Social History Narrative   Not on file   Social Determinants of Health   Financial Resource Strain: Not on file  Food Insecurity: Not on file  Transportation Needs: Not on file  Physical Activity: Not on file  Stress: Not on file  Social Connections: Not on file  Intimate Partner Violence: Not on file    Family History  Problem Relation Age of Onset   Hypertension Mother    Hypertension Maternal Grandmother    Diabetes Maternal Grandmother     PHYSICAL EXAM: Vitals:   10/20/20 0700 10/20/20 0800  BP: 115/70 106/72  Pulse: 96 (!) 103  Resp: 16 17  Temp:    SpO2: 99% 92%     Intake/Output Summary (Last 24 hours) at 10/20/2020 0842 Last data filed at 10/19/2020 2145 Gross per 24 hour  Intake 100 ml  Output --  Net 100 ml  General:  Well appearing. No respiratory difficulty HEENT: normal Neck: supple. no JVD. Carotids 2+ bilat; no bruits. No lymphadenopathy or thryomegaly appreciated. Cor: PMI nondisplaced. Regular rate & rhythm. No rubs, gallops or murmurs. Lungs: clear Abdomen: soft, nontender, nondistended. No hepatosplenomegaly. No bruits or masses. Good bowel sounds. Extremities: no cyanosis, clubbing, rash, edema Neuro: alert & oriented x 3, cranial nerves grossly intact. moves all 4 extremities w/o difficulty. Affect pleasant.  ECG: Sinus tachycardia, HR 117 bpm, RBBB, nonspecific ST changes  Results for orders placed or performed during the hospital encounter of 10/19/20 (from the past 24 hour(s))  Type and screen     Status: None (Preliminary result)   Collection Time: 10/19/20  9:42 AM   Result Value Ref Range   ABO/RH(D) A POS    Antibody Screen NEG    Sample Expiration      10/22/2020,2359 Performed at [facility], [address], [place], [place] [address]    Unit Number [id number]    Blood Component Type RED CELLS,LR    Unit division 00    Status of Unit ALLOCATED    Transfusion Status OK TO TRANSFUSE    Crossmatch Result Compatible    Unit Number [id number]    Blood Component Type RBC LR PHER1    Unit division 00    Status of Unit ALLOCATED    Transfusion Status OK TO TRANSFUSE    Crossmatch Result Compatible    Unit Number [id number]    Blood Component Type RBC LR PHER2    Unit division 00    Status of Unit ALLOCATED    Transfusion Status OK TO TRANSFUSE    Crossmatch Result Compatible   Troponin I (High Sensitivity)     Status: None   Collection Time: 10/19/20  9:42 AM  Result Value Ref Range   Troponin I (High Sensitivity) 8 <18 ng/L  Prepare RBC (crossmatch)     Status: None   Collection Time: 10/19/20 11:35 AM  Result Value Ref Range   Order Confirmation      ORDER PROCESSED BY BLOOD BANK Performed at [facility], [address] [place], [place] [address]   Hemoglobin and hematocrit, blood     Status: Abnormal   Collection Time: 10/19/20  3:38 PM  Result Value Ref Range   Hemoglobin 10.7 (L) 13.0 - 17.0 g/dL   HCT 35.2 (L) 39.0 - 52.0 %  Resp Panel by RT-PCR (Flu A&B, Covid) Nasopharyngeal Swab     Status: None   Collection Time: 10/19/20  3:38 PM   Specimen: Nasopharyngeal Swab; Nasopharyngeal(NP) swabs in vial transport medium  Result Value Ref Range   SARS Coronavirus 2 by RT PCR NEGATIVE NEGATIVE   Influenza A by PCR NEGATIVE NEGATIVE   Influenza B by PCR NEGATIVE NEGATIVE  Protime-INR     Status: Abnormal   Collection Time: 10/19/20  7:35 PM  Result Value Ref Range   Prothrombin Time 19.3 (H) 11.4 - 15.2 seconds   INR 1.6 (H) 0.8 - 1.2  CBC with Differential     Status: Abnormal   Collection Time:  10/19/20  7:35 PM  Result Value Ref Range   WBC 6.8 4.0 - 10.5 K/uL   RBC 3.46 (L) 4.22 - 5.81 MIL/uL   Hemoglobin 10.1 (L) 13.0 - 17.0 g/dL   HCT 32.4 (L) 39.0 - 52.0 %   MCV 93.6 80.0 - 100.0 fL   MCH 29.2 26.0 - 34.0 pg   MCHC 31.2 30.0 - 36.0  g/dL   RDW 16.2 (H) 11.5 - 15.5 %   Platelets 191 150 - 400 K/uL   nRBC 0.0 0.0 - 0.2 %   Neutrophils Relative % 77 %   Neutro Abs 5.3 1.7 - 7.7 K/uL   Lymphocytes Relative 13 %   Lymphs Abs 0.9 0.7 - 4.0 K/uL   Monocytes Relative 8 %   Monocytes Absolute 0.5 0.1 - 1.0 K/uL   Eosinophils Relative 1 %   Eosinophils Absolute 0.1 0.0 - 0.5 K/uL   Basophils Relative 0 %   Basophils Absolute 0.0 0.0 - 0.1 K/uL   Immature Granulocytes 1 %   Abs Immature Granulocytes 0.04 0.00 - 0.07 K/uL  CBC with Differential     Status: Abnormal   Collection Time: 10/19/20 11:25 PM  Result Value Ref Range   WBC 6.8 4.0 - 10.5 K/uL   RBC 3.55 (L) 4.22 - 5.81 MIL/uL   Hemoglobin 10.3 (L) 13.0 - 17.0 g/dL   HCT 33.0 (L) 39.0 - 52.0 %   MCV 93.0 80.0 - 100.0 fL   MCH 29.0 26.0 - 34.0 pg   MCHC 31.2 30.0 - 36.0 g/dL   RDW 16.3 (H) 11.5 - 15.5 %   Platelets 184 150 - 400 K/uL   nRBC 0.0 0.0 - 0.2 %   Neutrophils Relative % 73 %   Neutro Abs 5.0 1.7 - 7.7 K/uL   Lymphocytes Relative 17 %   Lymphs Abs 1.1 0.7 - 4.0 K/uL   Monocytes Relative 8 %   Monocytes Absolute 0.5 0.1 - 1.0 K/uL   Eosinophils Relative 1 %   Eosinophils Absolute 0.1 0.0 - 0.5 K/uL   Basophils Relative 0 %   Basophils Absolute 0.0 0.0 - 0.1 K/uL   Immature Granulocytes 1 %   Abs Immature Granulocytes 0.04 0.00 - 0.07 K/uL  CBC with Differential     Status: Abnormal   Collection Time: 10/20/20  2:49 AM  Result Value Ref Range   WBC 6.8 4.0 - 10.5 K/uL   RBC 3.59 (L) 4.22 - 5.81 MIL/uL   Hemoglobin 10.2 (L) 13.0 - 17.0 g/dL   HCT 33.2 (L) 39.0 - 52.0 %   MCV 92.5 80.0 - 100.0 fL   MCH 28.4 26.0 - 34.0 pg   MCHC 30.7 30.0 - 36.0 g/dL   RDW 16.2 (H) 11.5 - 15.5 %   Platelets  184 150 - 400 K/uL   nRBC 0.0 0.0 - 0.2 %   Neutrophils Relative % 71 %   Neutro Abs 4.8 1.7 - 7.7 K/uL   Lymphocytes Relative 19 %   Lymphs Abs 1.3 0.7 - 4.0 K/uL   Monocytes Relative 8 %   Monocytes Absolute 0.5 0.1 - 1.0 K/uL   Eosinophils Relative 1 %   Eosinophils Absolute 0.1 0.0 - 0.5 K/uL   Basophils Relative 0 %   Basophils Absolute 0.0 0.0 - 0.1 K/uL   Immature Granulocytes 1 %   Abs Immature Granulocytes 0.05 0.00 - 0.07 K/uL  Protime-INR     Status: Abnormal   Collection Time: 10/20/20  6:43 AM  Result Value Ref Range   Prothrombin Time 16.1 (H) 11.4 - 15.2 seconds   INR 1.3 (H) 0.8 - 1.2  CBC with Differential     Status: Abnormal   Collection Time: 10/20/20  6:43 AM  Result Value Ref Range   WBC 6.3 4.0 - 10.5 K/uL   RBC 3.52 (L) 4.22 - 5.81 MIL/uL   Hemoglobin 10.0 (L)  13.0 - 17.0 g/dL   HCT 32.2 (L) 39.0 - 52.0 %   MCV 91.5 80.0 - 100.0 fL   MCH 28.4 26.0 - 34.0 pg   MCHC 31.1 30.0 - 36.0 g/dL   RDW 16.4 (H) 11.5 - 15.5 %   Platelets 175 150 - 400 K/uL   nRBC 0.0 0.0 - 0.2 %   Neutrophils Relative % 74 %   Neutro Abs 4.7 1.7 - 7.7 K/uL   Lymphocytes Relative 16 %   Lymphs Abs 1.0 0.7 - 4.0 K/uL   Monocytes Relative 8 %   Monocytes Absolute 0.5 0.1 - 1.0 K/uL   Eosinophils Relative 1 %   Eosinophils Absolute 0.1 0.0 - 0.5 K/uL   Basophils Relative 0 %   Basophils Absolute 0.0 0.0 - 0.1 K/uL   Immature Granulocytes 1 %   Abs Immature Granulocytes 0.05 0.00 - 0.07 K/uL   NM GI Blood Loss  Result Date: 10/19/2020 CLINICAL DATA:  Evaluate for GI bleed. EXAM: NUCLEAR MEDICINE GASTROINTESTINAL BLEEDING SCAN TECHNIQUE: Sequential abdominal images were obtained following intravenous administration of Tc-73mlabeled red blood cells. RADIOPHARMACEUTICALS:  30.38 mCi Tc-922mertechnetate in-vitro labeled red cells. COMPARISON:  None. FINDINGS: Abnormal intraluminal radiotracer activity is identified within the left lower quadrant of the abdomen. This has a tubular  configuration which increases in intensity over time and progresses in a antegrade distribution. Intraluminal radiotracer activity is favored to originate from the descending colon (which is excluded from the field of view due to patient's body habitus) or proximal sigmoid colon. IMPRESSION: Examination is positive for active GI bleed. Bowel activity is most intense within the left lower quadrant of the abdomen consistent with active bleeding within the left colon. These results were called by telephone at the time of interpretation on 10/19/2020 at 3:27 pm to provider [facility]bil[facility] who verbally acknowledged these results. Electronically Signed   By: TaKerby Moors.D.   On: 10/19/2020 15:28   DG Chest Portable 1 View  Result Date: 10/19/2020 CLINICAL DATA:  [age]ear old male with shortness of breath. Left lower quadrant pain. Bright red blood per rectum. EXAM: PORTABLE CHEST 1 VIEW COMPARISON:  Portable chest 05/13/2017 and earlier. FINDINGS: Portable AP semi upright view at 0805 hours. Chronically low lung volumes. Normal cardiac size and mediastinal contours. Visualized tracheal air column is within normal limits. Allowing for portable technique the lungs are clear. No pneumothorax or pleural effusion. No acute osseous abnormality identified. IMPRESSION: Low lung volumes.  No acute cardiopulmonary abnormality. Electronically Signed   By: H Genevie Ann.D.   On: 10/19/2020 08:28     ASSESSMENT AND PLAN: Patient can continue holding warfarin pending results of nuclear medicine scan. May restart when bleeding has resolved.  AmEngineer, drillingNP-C

## 2020-10-20 NOTE — ED Notes (Signed)
Attempted to draw new CBC from IV and straight stick. Phlebotomy called at this time to stick patient.

## 2020-10-21 ENCOUNTER — Encounter
Admission: EM | Disposition: A | Discharge status: Home / Self Care | Payer: Self-pay | Source: Home / Self Care | Attending: Internal Medicine

## 2020-10-21 ENCOUNTER — Inpatient Hospital Stay: Payer: 59 | Admitting: Anesthesiology

## 2020-10-21 ENCOUNTER — Encounter: Payer: Self-pay | Admitting: Internal Medicine

## 2020-10-21 DIAGNOSIS — K922 Gastrointestinal hemorrhage, unspecified: Secondary | ICD-10-CM | POA: Diagnosis not present

## 2020-10-21 HISTORY — PX: COLONOSCOPY: SHX5424

## 2020-10-21 LAB — CBC WITH DIFFERENTIAL/PLATELET
Abs Immature Granulocytes: 0.07 10*3/uL (ref 0.00–0.07)
Basophils Absolute: 0 10*3/uL (ref 0.0–0.1)
Basophils Relative: 0 %
Eosinophils Absolute: 0.1 10*3/uL (ref 0.0–0.5)
Eosinophils Relative: 2 %
HCT: 36.1 % — ABNORMAL LOW (ref 39.0–52.0)
Hemoglobin: 11.2 g/dL — ABNORMAL LOW (ref 13.0–17.0)
Immature Granulocytes: 1 %
Lymphocytes Relative: 13 %
Lymphs Abs: 0.9 10*3/uL (ref 0.7–4.0)
MCH: 28.9 pg (ref 26.0–34.0)
MCHC: 31 g/dL (ref 30.0–36.0)
MCV: 93.3 fL (ref 80.0–100.0)
Monocytes Absolute: 0.6 10*3/uL (ref 0.1–1.0)
Monocytes Relative: 8 %
Neutro Abs: 5.5 10*3/uL (ref 1.7–7.7)
Neutrophils Relative %: 76 %
Platelets: 179 10*3/uL (ref 150–400)
RBC: 3.87 MIL/uL — ABNORMAL LOW (ref 4.22–5.81)
RDW: 16.2 % — ABNORMAL HIGH (ref 11.5–15.5)
WBC: 7.2 10*3/uL (ref 4.0–10.5)
nRBC: 0.3 % — ABNORMAL HIGH (ref 0.0–0.2)

## 2020-10-21 LAB — GLUCOSE, CAPILLARY
Glucose-Capillary: 104 mg/dL — ABNORMAL HIGH (ref 70–99)
Glucose-Capillary: 115 mg/dL — ABNORMAL HIGH (ref 70–99)
Glucose-Capillary: 94 mg/dL (ref 70–99)
Glucose-Capillary: 112 mg/dL — ABNORMAL HIGH (ref 70–99)
Glucose-Capillary: 131 mg/dL — ABNORMAL HIGH (ref 70–99)

## 2020-10-21 LAB — BASIC METABOLIC PANEL
Anion gap: 10 (ref 5–15)
BUN: 9 mg/dL (ref 6–20)
CO2: 27 mmol/L (ref 22–32)
Calcium: 9.4 mg/dL (ref 8.9–10.3)
Chloride: 97 mmol/L — ABNORMAL LOW (ref 98–111)
Creatinine, Ser: 0.71 mg/dL (ref 0.61–1.24)
GFR, Estimated: >60 mL/min (ref >60)
Glucose, Bld: 102 mg/dL — ABNORMAL HIGH (ref 70–99)
Potassium: 3.6 mmol/L (ref 3.5–5.1)
Sodium: 134 mmol/L — ABNORMAL LOW (ref 135–145)

## 2020-10-21 LAB — PROTIME-INR
INR: 1.2 (ref 0.8–1.2)
Prothrombin Time: 15.1 seconds (ref 11.4–15.2)

## 2020-10-21 LAB — CBC
HCT: 35.2 % — ABNORMAL LOW (ref 39.0–52.0)
Hemoglobin: 11.1 g/dL — ABNORMAL LOW (ref 13.0–17.0)
MCH: 29.1 pg (ref 26.0–34.0)
MCHC: 31.5 g/dL (ref 30.0–36.0)
MCV: 92.1 fL (ref 80.0–100.0)
Platelets: 180 10*3/uL (ref 150–400)
RBC: 3.82 MIL/uL — ABNORMAL LOW (ref 4.22–5.81)
RDW: 16.3 % — ABNORMAL HIGH (ref 11.5–15.5)
WBC: 6.8 10*3/uL (ref 4.0–10.5)
nRBC: 0 % (ref 0.0–0.2)

## 2020-10-21 LAB — HEMOGLOBIN A1C
Hgb A1c MFr Bld: 6.2 % — ABNORMAL HIGH (ref 4.8–5.6)
Mean Plasma Glucose: 131.24 mg/dL

## 2020-10-21 SURGERY — COLONOSCOPY
Anesthesia: General

## 2020-10-21 MED ORDER — SODIUM CHLORIDE 0.9 % IV SOLN
INTRAVENOUS | Status: DC
  Administered 2020-10-21: 1000 mL via INTRAVENOUS

## 2020-10-21 MED ORDER — DEXMEDETOMIDINE (PRECEDEX) IN NS 20 MCG/5ML (4 MCG/ML) IV SYRINGE
PREFILLED_SYRINGE | INTRAVENOUS | Status: DC | PRN
  Administered 2020-10-21: 12 ug via INTRAVENOUS
  Administered 2020-10-21: 20 ug via INTRAVENOUS
  Administered 2020-10-21: 12 ug via INTRAVENOUS
  Administered 2020-10-21: 20 ug via INTRAVENOUS

## 2020-10-21 MED ORDER — PROPOFOL 10 MG/ML IV BOLUS
INTRAVENOUS | Status: DC | PRN
  Administered 2020-10-21 (×3): 40 mg via INTRAVENOUS
  Administered 2020-10-21: 20 mg via INTRAVENOUS

## 2020-10-21 MED ORDER — HYDROXYZINE HCL 25 MG PO TABS
25.0000 mg | ORAL_TABLET | Freq: Three times a day (TID) | ORAL | Status: DC | PRN
  Administered 2020-10-22 – 2020-10-23 (×3): 25 mg via ORAL
  Filled 2020-10-21 (×3): qty 1

## 2020-10-21 MED ORDER — AMIODARONE HCL 200 MG PO TABS
200.0000 mg | ORAL_TABLET | Freq: Two times a day (BID) | ORAL | Status: DC
  Administered 2020-10-21 – 2020-10-24 (×6): 200 mg via ORAL
  Filled 2020-10-21 (×6): qty 1

## 2020-10-21 MED ORDER — HEPARIN (PORCINE) 25000 UT/250ML-% IV SOLN
15.0000 [IU]/kg/h | INTRAVENOUS | Status: DC
  Administered 2020-10-21: 15 [IU]/kg/h via INTRAVENOUS
  Filled 2020-10-21: qty 250

## 2020-10-21 MED ORDER — AMIODARONE HCL 200 MG PO TABS: 400.0000 mg | ORAL_TABLET | Freq: Every day | ORAL | Status: DC

## 2020-10-21 MED ORDER — PROPOFOL 500 MG/50ML IV EMUL
INTRAVENOUS | Status: DC | PRN
  Administered 2020-10-21: 50 ug/kg/min via INTRAVENOUS

## 2020-10-21 MED ORDER — LORAZEPAM 2 MG/ML IJ SOLN
1.0000 mg | Freq: Once | INTRAMUSCULAR | Status: DC
  Administered 2020-10-21: 1 mg via INTRAVENOUS

## 2020-10-21 MED ORDER — PANTOPRAZOLE SODIUM 40 MG PO TBEC
40.0000 mg | DELAYED_RELEASE_TABLET | Freq: Every day | ORAL | Status: DC
  Administered 2020-10-22 – 2020-10-24 (×3): 40 mg via ORAL
  Filled 2020-10-21 (×3): qty 1

## 2020-10-21 MED ORDER — POTASSIUM CHLORIDE CRYS ER 20 MEQ PO TBCR
40.0000 meq | EXTENDED_RELEASE_TABLET | Freq: Once | ORAL | Status: AC
  Administered 2020-10-21: 40 meq via ORAL
  Filled 2020-10-21: qty 4

## 2020-10-21 MED ORDER — LORAZEPAM 2 MG/ML IJ SOLN
2.0000 mg | Freq: Once | INTRAMUSCULAR | Status: AC
  Administered 2020-10-21: 2 mg via INTRAVENOUS
  Filled 2020-10-21: qty 1

## 2020-10-21 NOTE — Progress Notes (Signed)
[place] [facility] [place] NAME: Russell Ray    MR#:  [id number]  DATE OF BIRTH:  1971-02-22  SUBJECTIVE:   patient came in with rectal bleed. Complains of overall abdominal pain and body aches. Complains of anxiety. Asking for IV Ativan and narcotics. Patient does not taken narcotics at home on a regular basis.  got back from colonoscopy earlier REVIEW OF SYSTEMS:   Review of Systems  Constitutional:  Negative for chills, fever and weight loss.  HENT:  Negative for ear discharge, ear pain and nosebleeds.   Eyes:  Negative for blurred vision, pain and discharge.  Respiratory:  Negative for sputum production, shortness of breath, wheezing and stridor.   Cardiovascular:  Negative for chest pain, palpitations, orthopnea and PND.  Gastrointestinal:  Positive for blood in stool. Negative for abdominal pain, diarrhea, nausea and vomiting.  Genitourinary:  Negative for frequency and urgency.  Musculoskeletal:  Negative for back pain and joint pain.  Neurological:  Negative for sensory change, speech change, focal weakness and weakness.  Psychiatric/Behavioral:  Negative for depression and hallucinations. The patient is not nervous/anxious.   Tolerating Diet: Tolerating PT:   DRUG ALLERGIES:  No Known Allergies  VITALS:  Blood pressure (!) 111/56, pulse 100, temperature 98.2 F (36.8 C), resp. rate 16, height 6' (1.829 m), weight (!) 246 kg, SpO2 92 %.  PHYSICAL EXAMINATION:   Physical Exam  GENERAL:  50 y.o.-year-old patient lying in the bed with no acute distress. Morbidly obese LUNGS: Normal breath sounds bilaterally, no wheezing, rales, rhonchi. No use of accessory muscles of respiration.  CARDIOVASCULAR: S1, S2 normal. No murmurs, rubs, or gallops.  ABDOMEN: Soft, nontender, nondistended. Bowel sounds present. No organomegaly or mass. Severe abdominal obesity EXTREMITIES: No cyanosis, clubbing or edema b/l.    NEUROLOGIC: nonfocal  PSYCHIATRIC:  patient is alert and oriented x 3.  SKIN: No obvious rash, lesion, or ulcer.   LABORATORY PANEL:  CBC Recent Labs  Lab 10/21/20 0546  WBC 7.2  HGB 11.2*  HCT 36.1*  PLT 179    Chemistries  Recent Labs  Lab 10/19/20 0737 10/21/20 0546  NA 137 134*  K 4.1 3.6  CL 101 97*  CO2 25 27  GLUCOSE 117* 102*  BUN 21* 9  CREATININE 0.67 0.71  CALCIUM 9.8 9.4  AST 17  --   ALT 16  --   ALKPHOS 85  --   BILITOT 1.0  --    Cardiac Enzymes No results for input(s): TROPONINI in the last 168 hours. RADIOLOGY:  No results found. ASSESSMENT AND PLAN:  Russell Ray is a 50 y.o. male with medical history significant of s/p of mechanical AVR 04/26/15 on coumadin, a fib, HTN, DM, depression with anxiety, dCHF, anemia, former smoker, OSA, morbid obesity with BMI 73.37, who presents with rectal bleeding.  Pt states that his rectal bleeding started at 11 PM Tuesday night.  He has bright red-colored rectal bleeding. He also reports mild left lower quadrant abdominal pain.   Lower GI bleeding: Hgb stable, 11.3 --> 10.9 --> 10.0 -->10.2--> 11.1-->11.2 -- nuclear scan showed no significant active bleeding today.  --pt was accepted by [facility] for transfer, but no bed available yet. -- Dr. Alice Reichert of GI is consulted patient is status post colonoscopy --Colonoscopy--Non-bleeding internal hemorrhoids.                        - Diverticulosis in the sigmoid colon.                        -  Blood in the distal sigmoid colon.                        - Mild diverticulosis in the sigmoid colon. There was                         no evidence of diverticular bleeding.                        - The examined portion of the ileum was normal.                        - The examination was otherwise normal.  -- Since patient's hemoglobin is stable plan is to start patient on IV heparin drip and monitor over the weekend. Start clear liquid diet. If no further evidence of G.I. bleed will resume  Coumadin on Monday. This was discussed with Dr. Alice Reichert. -- in the interim if he has recurrent G.I. bleed Dr. Alice Reichert recommends transfer to [facility] for further evaluation with colorectal surgeon. This was discussed with patient and he is in agreement with plan    Iron deficiency anemia and anemia due to blood loss -continue iron supplement   Morbid obesity ([place]): BMI 73.37 -Healthy diet -Exercise   H/O aortic valve replacement: Mechanical valve replacement 04/26/2015.  The goal for INR 2-3. -Hold Coumadin -Dr. Humphrey Rolls of card is consulted   Atrial fibrillation ([place]) -Hold Coumadin -Amiodarone, metoprolol   Anxiety -Continue home Ativan 1 mg twice daily -- Ativan IV 2 mg times one--- patient request --Vistaril 25 mg TID PRN   Chronic diastolic CHF (congestive heart failure) ([place]): BNP 52.4.  Patient has 1+ leg edema, but no pulmonary edema on chest x-ray.  CHF seem to be compensated.  2D echo 11/12/2018 showed EF 60-35%.  Patient is not taking diuretics currently -Watch volume status closely   Essential (primary) hypertension -Hold lisinopril due to GI bleeding -Continue metoprolol   Type 2 diabetes mellitus without complication [facility]): Patient is taking Actos.  A1c 6.2 in 2017, well controlled at that time -Sliding scale insulin         DVT ppx: SCD Code Status: Full code Family Communication: Patient does not want [place] to call family. Disposition Plan:  Anticipate discharge back to previous environment Consults called:  Dr. Alice Reichert of GI is consulted. Dr. Humphrey Rolls of card is consulted. Admission status and Level of care:     progressive unit   as inpt          Status is: Inpatient   Remains inpatient appropriate because:Inpatient level of care appropriate due to severity of illness   Dispo: The patient is from: Home              Anticipated d/c is to: to be determined              Patient currently is not medically stable to d/c.              Difficult to place patient No  PT OT  to see patient. TOC for discharge planning. Patient will likely stay over the weekend to monitor hemoglobin/G.I. bleed.       TOTAL TIME TAKING CARE OF THIS PATIENT: 25 minutes.  >50% time spent on counselling and coordination of care  Note: This dictation was prepared with Dragon dictation along with smaller phrase technology. Any transcriptional errors that result  from this process are unintentional.  Fritzi Mandes M.D    [facility]   CC: Primary care physician; [name], MD Patient ID: Russell Ray, male   DOB: 04-14-1970, 49 y.o.   MRN: [id number]

## 2020-10-21 NOTE — Interval H&P Note (Signed)
History and Physical Interval Note:  10/21/2020 12:14 PM  Russell Ray  has presented today for surgery, with the diagnosis of Hematochezia/Lower GI Bleed.  The various methods of treatment have been discussed with the patient and family. After consideration of risks, benefits and other options for treatment, the patient has consented to  Procedure(s): COLONOSCOPY (N/A) as a surgical intervention.  The patient's history has been reviewed, patient examined, no change in status, stable for surgery.  I have reviewed the patient's chart and labs.  Questions were answered to the patient's satisfaction.     [place], [place]

## 2020-10-21 NOTE — Transfer of Care (Signed)
Immediate Anesthesia Transfer of Care Note  Patient: Russell Ray  Procedure(s) Performed: COLONOSCOPY  Patient Location: PACU  Anesthesia Type:MAC  Level of Consciousness: awake, alert  and oriented  Airway & Oxygen Therapy: Patient Spontanous Breathing and Patient connected to face mask oxygen  Post-op Assessment: Report given to RN and Post -op Vital signs reviewed and stable  Post vital signs: Reviewed and stable  Last Vitals:  Vitals Value Taken Time  BP 122/82 10/21/20 1427  Temp 36.4 C 10/21/20 1427  Pulse 111 10/21/20 1428  Resp 15 10/21/20 1428  SpO2 99 % 10/21/20 1428  Vitals shown include unvalidated device data.  Last Pain:  Vitals:   10/21/20 1427  TempSrc: Temporal  PainSc: 0-No pain      Patients Stated Pain Goal: 0 (45/99/77 4142)  Complications: No notable events documented.

## 2020-10-21 NOTE — Anesthesia Preprocedure Evaluation (Signed)
Anesthesia Evaluation  Patient identified by MRN, date of birth, ID band Patient awake    Reviewed: Allergy & Precautions, NPO status , Patient's Chart, lab work & pertinent test results, reviewed documented beta blocker date and time   History of Anesthesia Complications Negative for: history of anesthetic complications  Airway Mallampati: III  TM Distance: >3 FB     Dental  (+) Chipped   Pulmonary shortness of breath (and with new a fib) and with exertion, sleep apnea and Continuous Positive Airway Pressure Ventilation , neg COPD, neg recent URI, former smoker,    Pulmonary exam normal        Cardiovascular hypertension, Pt. on medications and Pt. on home beta blockers (-) angina+CHF and + Orthopnea  (-) CAD, (-) Past MI, (-) Cardiac Stents and (-) CABG + dysrhythmias Atrial Fibrillation + Valvular Problems/Murmurs AS  Rhythm:Irregular     Neuro/Psych PSYCHIATRIC DISORDERS Anxiety Depression negative neurological ROS     GI/Hepatic negative GI ROS, Neg liver ROS,   Endo/Other  diabetes, Well Controlled, Type 2Morbid obesity  Renal/GU negative Renal ROS  negative genitourinary   Musculoskeletal negative musculoskeletal ROS (+)   Abdominal (+) + obese,   Peds negative pediatric ROS (+)  Hematology  (+) anemia ,   Anesthesia Other Findings Anxiety    Aortic valve stenosis S/P_ AVR 2018 The left ventricle has normal systolic function with an ejection  fraction of 60-65%   Atrial fibrillation (HCC)    CHF (congestive heart failure) (HCC)  Depression    Diabetes mellitus without complication (HCC) Hypertension       Reproductive/Obstetrics                             Anesthesia Physical  Anesthesia Plan  ASA: 3  Anesthesia Plan: General   Post-op Pain Management:    Induction: Intravenous  PONV Risk Score and Plan: 2 and Propofol infusion and TIVA  Airway Management Planned:  Natural Airway and Nasal Cannula  Additional Equipment:   Intra-op Plan:   Post-operative Plan:   Informed Consent: I have reviewed the patients History and Physical, chart, labs and discussed the procedure including the risks, benefits and alternatives for the proposed anesthesia with the patient or authorized representative who has indicated his/her understanding and acceptance.     Dental advisory given  Plan Discussed with: CRNA, Surgeon and Anesthesiologist  Anesthesia Plan Comments:         Anesthesia Quick Evaluation

## 2020-10-21 NOTE — Consult Note (Signed)
ANTICOAGULATION CONSULT NOTE - Initial Consult  Pharmacy Consult for Heparin infusion Indication: Mechanical Aortic Valve, Afib  No Known Allergies  Patient Measurements: Height: 6' (182.9 cm) Weight: (!) 246 kg (542 lb 5.3 oz) IBW/kg (Calculated) : 77.6 Heparin Dosing Weight: 141.7 kg   Vital Signs: Temp: 98.2 F (36.8 C) (08/26 1544) Temp Source: Temporal (08/26 1427) BP: 111/56 (08/26 1544) Pulse Rate: 100 (08/26 1544)  Labs: Recent Labs    10/19/20 0737 10/19/20 0942 10/19/20 1538 10/19/20 1935 10/19/20 2325 10/20/20 0643 10/20/20 1110 10/20/20 1447 10/20/20 1800 10/20/20 2028 10/21/20 0043 10/21/20 0546  HGB 10.9*  --    < > 10.1*   < > 10.0*   < > 10.2*   < > 10.3* 11.1* 11.2*  HCT 36.0*  --    < > 32.4*   < > 32.2*   < > 33.1*   < > 33.3* 35.2* 36.1*  PLT 194  --   --  191   < > 175   < > 173   < > 161 180 179  APTT 38*  --   --   --   --   --   --  27  --   --   --   --   LABPROT 32.2*  --   --  19.3*  --  16.1*  --   --   --   --   --  15.1  INR 3.1*  --   --  1.6*  --  1.3*  --   --   --   --   --  1.2  CREATININE 0.67  --   --   --   --   --   --   --   --   --   --  0.71  TROPONINIHS 7 8  --   --   --   --   --   --   --   --   --   --    < > = values in this interval not displayed.    Estimated Creatinine Clearance: 231.6 mL/min (by C-G formula based on SCr of 0.71 mg/dL).   Medical History: Past Medical History:  Diagnosis Date   Anxiety    Aortic valve stenosis    Atrial fibrillation (HCC)    CHF (congestive heart failure) (HCC)    Depression    Diabetes mellitus without complication (HCC)    Hypertension     Medications:  Chronic coumadin AC therapy PTA: INR goal 2.5-3.5  PTA regimen: 2 mg every other day, rotating with 3 mg every other day Last dose 3 mg 10/18/20 Kcentra 2000 units x 1 10/19/20 Vitamin K 10 mg IV x 1 10/19/20  Assessment: 50 y.o. male with medical history significant of s/p of mechanical AVR 04/26/15 on coumadin, a fib  presented to ED 10/19/20 with rectal bleeding. S/p Kcentra + Vit K and colonoscopy. Hgb stable. Pharmacy has been consulted to initiate and manage heparin infusion. Patient NPO, except CLD. Per provider, ff no further evidence of G.I. bleed possibly resuming Coumadin on Monday.  Goal of Therapy:  Heparin level 0.3-0.7 units/ml Monitor platelets by anticoagulation protocol: Yes   Plan:  Start heparin infusion at 1250 units/hr w/o bolus per provider Check anti-Xa level in 6 hours and daily while on heparin Continue to monitor H&H and platelets  Dorothe Pea, PharmD, BCPS Clinical Pharmacist   10/21/2020,4:18 PM

## 2020-10-21 NOTE — Anesthesia Postprocedure Evaluation (Signed)
Anesthesia Post Note  Patient: Russell Ray  Procedure(s) Performed: COLONOSCOPY  Patient location during evaluation: Phase II Anesthesia Type: General Level of consciousness: awake and alert, awake and oriented Pain management: pain level controlled Vital Signs Assessment: post-procedure vital signs reviewed and stable Respiratory status: spontaneous breathing, nonlabored ventilation and respiratory function stable Cardiovascular status: blood pressure returned to baseline and stable Postop Assessment: no apparent nausea or vomiting Anesthetic complications: no   No notable events documented.   Last Vitals:  Vitals:   10/21/20 1447 10/21/20 1544  BP: 118/74 (!) 111/56  Pulse: 89 100  Resp: (!) 22 16  Temp:  36.8 C  SpO2: 94% 92%    Last Pain:  Vitals:   10/21/20 1447  TempSrc:   PainSc: 0-No pain                 Phill Mutter

## 2020-10-21 NOTE — Progress Notes (Signed)
SUBJECTIVE: Russell Ray is an 50 y.o. male with history of atrial fibrillation, tobacco abuse, morbid obesity, obstructive sleep apnea, history of aortic valve replacement on chronic Coumadin therapy and type 2 diabetes who was seen in the ER with lower GI bleed.  Patient was hemodynamically stable but having active bleed.  This was confirmed on CTA abdomen that showed ongoing active bleed.  Patient was seen by both cardiology and GI with recommendation for reversing his anticoagulation that has been done.  INR now down to 1.6 from about 3.  Patient however will require evaluation by IR and possibly vascular.  Due to his weight status which is more than 500 such a procedure is not possible here or at [facility].  Contact has been established with a referral to [facility].  He has been accepted but no beds yet.  We have been consulted for medical management while patient is awaiting transfer.  He currently is hemodynamically stable.  His INR as indicated has been reversed.  He is complaining of pain about 8 out of 10 in his abdomen.  He is otherwise stable.     Vitals:   10/20/20 2233 10/21/20 0007 10/21/20 0502 10/21/20 0738  BP: 122/68 128/83 119/73 (!) 149/95  Pulse: (!) 116 (!) 115 (!) 105 (!) 102  Resp: '20 18 20 16  '$ Temp: 98.2 F (36.8 C) 98.8 F (37.1 C) 97.7 F (36.5 C) 98.2 F (36.8 C)  TempSrc:      SpO2: 93% 95% 94% 94%  Weight:      Height:        Intake/Output Summary (Last 24 hours) at 10/21/2020 1152 Last data filed at 10/21/2020 0743 Gross per 24 hour  Intake 1200 ml  Output 2 ml  Net 1198 ml    LABS: Basic Metabolic Panel: Recent Labs    10/19/20 0737 10/21/20 0546  NA 137 134*  K 4.1 3.6  CL 101 97*  CO2 25 27  GLUCOSE 117* 102*  BUN 21* 9  CREATININE 0.67 0.71  CALCIUM 9.8 9.4   Liver Function Tests: Recent Labs    10/19/20 0737  AST 17  ALT 16  ALKPHOS 85  BILITOT 1.0  PROT 6.8  ALBUMIN 3.5   Recent Labs    10/19/20 0737   LIPASE 23   CBC: Recent Labs    10/20/20 2028 10/21/20 0043 10/21/20 0546  WBC 6.6 6.8 7.2  NEUTROABS 5.1  --  5.5  HGB 10.3* 11.1* 11.2*  HCT 33.3* 35.2* 36.1*  MCV 90.7 92.1 93.3  PLT 161 180 179   Cardiac Enzymes: No results for input(s): CKTOTAL, CKMB, CKMBINDEX, TROPONINI in the last 72 hours. BNP: Invalid input(s): POCBNP D-Dimer: No results for input(s): DDIMER in the last 72 hours. Hemoglobin A1C: Recent Labs    10/20/20 2028  HGBA1C 6.2*   Fasting Lipid Panel: No results for input(s): CHOL, HDL, LDLCALC, TRIG, CHOLHDL, LDLDIRECT in the last 72 hours. Thyroid Function Tests: No results for input(s): TSH, T4TOTAL, T3FREE, THYROIDAB in the last 72 hours.  Invalid input(s): FREET3 Anemia Panel: No results for input(s): VITAMINB12, FOLATE, FERRITIN, TIBC, IRON, RETICCTPCT in the last 72 hours.   PHYSICAL EXAM General: Well developed, well nourished, in no acute distress HEENT:  Normocephalic and atramatic Neck:  No JVD.  Lungs: Clear bilaterally to auscultation and percussion. Heart: HRRR . Normal S1 and S2 without gallops or murmurs.  Abdomen: Bowel sounds are positive, abdomen soft and non-tender  Msk:  Back normal,  normal gait. Normal strength and tone for age. Extremities: No clubbing, cyanosis or edema.   Neuro: Alert and oriented X 3. Psych:  Good affect, responds appropriately  TELEMETRY: NSR, HR 102 bpm  ASSESSMENT AND PLAN: Patient asymptomatic. Reports no blood in bowel movements since Wednesday evening. Patient scheduled for colonoscopy today. May restart warfarin when cleared by GI. INR range should be 2.5-3.5.  Principal Problem:   Lower GI bleeding Active Problems:   Morbid obesity ([place])   Iron deficiency anemia   H/O aortic valve replacement   Atrial fibrillation (HCC)   Anxiety   Chronic diastolic CHF (congestive heart failure) ([place])   Essential (primary) hypertension   Type 2 diabetes mellitus without complication ([place])   Anemia  due to blood loss    Shany Marinez, FNP-C 10/21/2020 11:52 AM

## 2020-10-21 NOTE — Op Note (Signed)
[facility] Gastroenterology Patient Name: Russell Ray Procedure Date: 10/21/2020 1:43 PM MRN: [id number] Account #: [id number] Date of Birth: 09-27-1970 Admit Type: Inpatient Age: 50 Room: [facility] ENDO ROOM 3 Gender: Male Note Status: Finalized Procedure:             Colonoscopy Indications:           Hematochezia, Acute post hemorrhagic anemia Providers:             Benay Pike. Alice Reichert MD, MD Referring MD:          Venetia Maxon. Elijio Miles, MD (Referring MD) Medicines:             Propofol per Anesthesia Complications:         No immediate complications. Estimated blood loss: None. Procedure:             Pre-Anesthesia Assessment:                        - The risks and benefits of the procedure and the                         sedation options and risks were discussed with the                         patient. All questions were answered and informed                         consent was obtained.                        - Patient identification and proposed procedure were                         verified prior to the procedure by the nurse. The                         procedure was verified in the procedure room.                        - After reviewing the risks and benefits, the patient                         was deemed in satisfactory condition to undergo the                         procedure.                        - ASA Grade Assessment: III - A patient with severe                         systemic disease.                        - The risks and benefits of the procedure and the                         sedation options and risks were discussed with the  patient. All questions were answered and informed                         consent was obtained.                        - Patient identification and proposed procedure were                         verified prior to the procedure by the nurse. The                         procedure was verified in the  procedure room.                        After obtaining informed consent, the colonoscope was                         passed under direct vision. Throughout the procedure,                         the patient's blood pressure, pulse, and oxygen                         saturations were monitored continuously. The                         Colonoscope was introduced through the anus and                         advanced to the the terminal ileum, with                         identification of the appendiceal orifice and IC                         valve. The colonoscopy was somewhat difficult due to                         the patient's body habitus and fair quality of the                         colon preparation. Successful completion of the                         procedure was aided by lavage. The patient tolerated                         the procedure well. The quality of the bowel                         preparation was fair. The terminal ileum, ileocecal                         valve, appendiceal orifice, and rectum were                         photographed. Findings:      The perianal and digital rectal examinations  were normal. Pertinent       negatives include normal sphincter tone and no palpable rectal lesions.      Non-bleeding internal hemorrhoids were found during retroflexion. The       hemorrhoids were Grade I (internal hemorrhoids that do not prolapse).      Many small and large-mouthed diverticula were found in the sigmoid colon.      Hematin (altered blood/coffee-ground-like material) was found in the       distal sigmoid colon. Lavage of the area was performed using a moderate       amount of sterile water, resulting in clearance with good visualization.       Estimated blood loss: none.      Many small and large-mouthed diverticula were found in the sigmoid       colon. There was no evidence of diverticular bleeding.      The terminal ileum appeared normal.      The exam  was otherwise without abnormality. Impression:            - Preparation of the colon was fair.                        - Non-bleeding internal hemorrhoids.                        - Diverticulosis in the sigmoid colon.                        - Blood in the distal sigmoid colon.                        - Mild diverticulosis in the sigmoid colon. There was                         no evidence of diverticular bleeding.                        - The examined portion of the ileum was normal.                        - The examination was otherwise normal.                        - No specimens collected. Recommendation:        - Return patient to hospital ward for observation.                        - May still need transfer to [facility] to observe when                         resuming coumadin to monitor if bleeding recurs.                         Discuss with primary team.                        - Clear liquid diet.                        - Continue present medications.                        -  The findings and recommendations were discussed with                         the patient. Procedure Code(s):     --- Professional ---                        [phone], Colonoscopy, flexible; diagnostic, including                         collection of specimen(s) by brushing or washing, when                         performed (separate procedure) Diagnosis Code(s):     --- Professional ---                        K57.30, Diverticulosis of large intestine without                         perforation or abscess without bleeding                        D62, Acute posthemorrhagic anemia                        K92.1, Melena (includes Hematochezia)                        K92.2, Gastrointestinal hemorrhage, unspecified                        K64.0, First degree hemorrhoids CPT copyright 2019 American Medical [employer]. All rights reserved. The codes documented in this report are preliminary and upon coder review may  be  revised to meet current compliance requirements. Efrain Sella MD, MD 10/21/2020 2:48:34 PM This report has been signed electronically. Number of Addenda: 0 Note Initiated On: 10/21/2020 1:43 PM Scope Withdrawal Time: 0 hours 5 minutes 57 seconds  Total Procedure Duration: 0 hours 14 minutes 35 seconds  Estimated Blood Loss:  Estimated blood loss: none. Estimated blood loss: none.      [facility]

## 2020-10-22 DIAGNOSIS — K922 Gastrointestinal hemorrhage, unspecified: Secondary | ICD-10-CM | POA: Diagnosis not present

## 2020-10-22 LAB — HEPARIN LEVEL (UNFRACTIONATED)
Heparin Unfractionated: <0.1 IU/mL — ABNORMAL LOW (ref 0.30–0.70)
Heparin Unfractionated: <0.1 IU/mL — ABNORMAL LOW (ref 0.30–0.70)
Heparin Unfractionated: 0.13 IU/mL — ABNORMAL LOW (ref 0.30–0.70)

## 2020-10-22 LAB — CBC
HCT: 33.9 % — ABNORMAL LOW (ref 39.0–52.0)
Hemoglobin: 10.2 g/dL — ABNORMAL LOW (ref 13.0–17.0)
MCH: 28 pg (ref 26.0–34.0)
MCHC: 30.1 g/dL (ref 30.0–36.0)
MCV: 93.1 fL (ref 80.0–100.0)
Platelets: 166 10*3/uL (ref 150–400)
RBC: 3.64 MIL/uL — ABNORMAL LOW (ref 4.22–5.81)
RDW: 16.5 % — ABNORMAL HIGH (ref 11.5–15.5)
WBC: 6.2 10*3/uL (ref 4.0–10.5)
nRBC: 0 % (ref 0.0–0.2)

## 2020-10-22 LAB — GLUCOSE, CAPILLARY
Glucose-Capillary: 111 mg/dL — ABNORMAL HIGH (ref 70–99)
Glucose-Capillary: 127 mg/dL — ABNORMAL HIGH (ref 70–99)
Glucose-Capillary: 109 mg/dL — ABNORMAL HIGH (ref 70–99)
Glucose-Capillary: 117 mg/dL — ABNORMAL HIGH (ref 70–99)

## 2020-10-22 MED ORDER — FERROUS SULFATE 325 (65 FE) MG PO TABS
325.0000 mg | ORAL_TABLET | Freq: Two times a day (BID) | ORAL | Status: DC
  Administered 2020-10-22 – 2020-10-24 (×4): 325 mg via ORAL
  Filled 2020-10-22 (×4): qty 1

## 2020-10-22 MED ORDER — HEPARIN BOLUS VIA INFUSION
4000.0000 [IU] | Freq: Once | INTRAVENOUS | Status: AC
  Administered 2020-10-22: 4000 [IU] via INTRAVENOUS
  Filled 2020-10-22: qty 4000

## 2020-10-22 MED ORDER — WARFARIN - PHARMACIST DOSING INPATIENT: Freq: Every day | Status: DC

## 2020-10-22 MED ORDER — HEPARIN (PORCINE) 25000 UT/250ML-% IV SOLN
3350.0000 [IU]/h | INTRAVENOUS | Status: DC
  Administered 2020-10-22: 2450 [IU]/h via INTRAVENOUS
  Administered 2020-10-22: 2750 [IU]/h via INTRAVENOUS
  Administered 2020-10-22: 3050 [IU]/h via INTRAVENOUS
  Administered 2020-10-22: 2450 [IU]/h via INTRAVENOUS
  Administered 2020-10-23 – 2020-10-24 (×4): 3350 [IU]/h via INTRAVENOUS
  Filled 2020-10-22 (×8): qty 250

## 2020-10-22 MED ORDER — WARFARIN SODIUM 5 MG PO TABS
5.0000 mg | ORAL_TABLET | Freq: Once | ORAL | Status: AC
  Administered 2020-10-22: 5 mg via ORAL
  Filled 2020-10-22: qty 1

## 2020-10-22 NOTE — Progress Notes (Addendum)
Sonia Baller from [facility] asked for update on pt. Will notify incoming shift. Will continue to monitor.

## 2020-10-22 NOTE — Consult Note (Addendum)
ANTICOAGULATION CONSULT NOTE   Pharmacy Consult for Heparin infusion Indication: Mechanical Aortic Valve, Afib  No Known Allergies  Patient Measurements: Height: 6' (182.9 cm) Weight: (!) 246 kg (542 lb 5.3 oz) IBW/kg (Calculated) : 77.6 Heparin Dosing Weight: 141.7 kg   Vital Signs: Temp: 97.7 F (36.5 C) (08/27 0020) Temp Source: Temporal (08/26 1427) BP: 122/79 (08/27 0020) Pulse Rate: 92 (08/27 0020)  Labs: Recent Labs    10/19/20 0737 10/19/20 0942 10/19/20 1538 10/19/20 1935 10/19/20 2325 10/20/20 0643 10/20/20 1110 10/20/20 1447 10/20/20 1800 10/20/20 2028 10/21/20 0043 10/21/20 0546  HGB 10.9*  --    < > 10.1*   < > 10.0*   < > 10.2*   < > 10.3* 11.1* 11.2*  HCT 36.0*  --    < > 32.4*   < > 32.2*   < > 33.1*   < > 33.3* 35.2* 36.1*  PLT 194  --   --  191   < > 175   < > 173   < > 161 180 179  APTT 38*  --   --   --   --   --   --  27  --   --   --   --   LABPROT 32.2*  --   --  19.3*  --  16.1*  --   --   --   --   --  15.1  INR 3.1*  --   --  1.6*  --  1.3*  --   --   --   --   --  1.2  CREATININE 0.67  --   --   --   --   --   --   --   --   --   --  0.71  TROPONINIHS 7 8  --   --   --   --   --   --   --   --   --   --    < > = values in this interval not displayed.     Estimated Creatinine Clearance: 231.6 mL/min (by C-G formula based on SCr of 0.71 mg/dL).   Medical History: Past Medical History:  Diagnosis Date   Anxiety    Aortic valve stenosis    Atrial fibrillation (HCC)    CHF (congestive heart failure) (HCC)    Depression    Diabetes mellitus without complication (HCC)    Hypertension     Medications:  Chronic coumadin AC therapy PTA: INR goal 2.5-3.5  PTA regimen: 2 mg every other day, rotating with 3 mg every other day Last dose 3 mg 10/18/20 Kcentra 2000 units x 1 10/19/20 Vitamin K 10 mg IV x 1 10/19/20  Assessment: 50 y.o. male with medical history significant of s/p of mechanical AVR 04/26/15 on coumadin, a fib presented to ED  10/19/20 with rectal bleeding. S/p Kcentra + Vit K and colonoscopy. Hgb stable. Pharmacy has been consulted to initiate and manage heparin infusion. Patient NPO, except CLD. Per provider, ff no further evidence of G.I. bleed possibly resuming Coumadin on Monday.   Goal of Therapy:  Heparin level 0.3-0.7 units/ml Monitor platelets by anticoagulation protocol: Yes  8/26 2351 HL <0.1 (0.06 per lab), subtherapeutic   Plan: Initial heparin order entered at rate of 15 units/kg/hr per HDW 141.7 kg = 2150 units/hr Due to recent GI bleed, will increase heparin infusion rate cautiously increase heparin infusion to 2450 units/hr w/o bolus per provider Recheck  HL in 6 hr after rate change Continue to monitor H&H and platelets  Renda Rolls, PharmD, [facility] 10/22/2020 2:21 AM

## 2020-10-22 NOTE — Consult Note (Signed)
[place] for warfarin + heparin infusion bridge Indication: Mechanical Aortic Valve, Afib  No Known Allergies  Patient Measurements: Height: 6' (182.9 cm) Weight: (!) 246 kg (542 lb 5.3 oz) IBW/kg (Calculated) : 77.6 Heparin Dosing Weight: 141.7 kg   Vital Signs: Temp: 98 F (36.7 C) (08/27 0737) Temp Source: Oral (08/27 0737) BP: 126/62 (08/27 0737) Pulse Rate: 85 (08/27 0737)  Labs: Recent Labs    10/19/20 1935 10/19/20 2325 10/20/20 0643 10/20/20 1110 10/20/20 1447 10/20/20 1800 10/21/20 0043 10/21/20 0546 10/21/20 2351 10/22/20 0432 10/22/20 0903  HGB 10.1*   < > 10.0*   < > 10.2*   < > 11.1* 11.2*  --  10.2*  --   HCT 32.4*   < > 32.2*   < > 33.1*   < > 35.2* 36.1*  --  33.9*  --   PLT 191   < > 175   < > 173   < > 180 179  --  166  --   APTT  --   --   --   --  27  --   --   --   --   --   --   LABPROT 19.3*  --  16.1*  --   --   --   --  15.1  --   --   --   INR 1.6*  --  1.3*  --   --   --   --  1.2  --   --   --   HEPARINUNFRC  --   --   --   --   --   --   --   --  <0.10*  --  <0.10*  CREATININE  --   --   --   --   --   --   --  0.71  --   --   --    < > = values in this interval not displayed.     Estimated Creatinine Clearance: 231.6 mL/min (by C-G formula based on SCr of 0.71 mg/dL).   Medical History: Past Medical History:  Diagnosis Date   Anxiety    Aortic valve stenosis    Atrial fibrillation (HCC)    CHF (congestive heart failure) (HCC)    Depression    Diabetes mellitus without complication (HCC)    Hypertension     Medications:  Chronic coumadin AC therapy PTA: INR goal 2.5-3.5  PTA regimen: 2 mg every other day, rotating with 3 mg every other day Last dose 3 mg 10/18/20 Kcentra 2000 units x 1 10/19/20 Vitamin K 10 mg IV x 1 10/19/20 INR S/P reversal 1.2  Assessment: 50 y.o. male with medical history significant of s/p of mechanical AVR 04/26/15 on coumadin, a fib presented to ED 10/19/20 with  rectal bleeding. S/p Kcentra + Vit K and colonoscopy. Hgb stable.   Per provider, no further evidence of G.I. bleed. Pharmacy consult to restart warfarin. Plan to continue heparin infusion bridge.   Goal of Therapy:  INR 2.5 - 3.5 Heparin level 0.3-0.7 units/ml Monitor platelets by anticoagulation protocol: Yes  8/26 2351 HL < 0.1 (0.06 per lab), subtherapeutic @ 2150 8/27 0903 HL < 0.1, subtherapeutic @ 2450   Plan: Warfarin Patient alternates 2-3 mg at home. Will give warfarin 5 mg which is just over 1.5x home dose. Follow up INR with morning labs.  Heparin Continue heparin infusion at 2750 units/hr w/o bolus per provider  Recheck HL in 6 hr after rate change Continue to monitor H&H and platelets  Tawnya Crook, PharmD, BCPS Clinical Pharmacist 10/22/2020 10:45 AM

## 2020-10-22 NOTE — Consult Note (Signed)
[place] for warfarin + heparin infusion bridge Indication: Mechanical Aortic Valve, Afib  No Known Allergies  Patient Measurements: Height: 6' (182.9 cm) Weight: (!) 246 kg (542 lb 5.3 oz) IBW/kg (Calculated) : 77.6 Heparin Dosing Weight: 141.7 kg   Vital Signs: Temp: 97.5 F (36.4 C) (08/27 1444) Temp Source: Oral (08/27 1444) BP: 118/66 (08/27 1444) Pulse Rate: 84 (08/27 1444)  Labs: Recent Labs    10/19/20 1935 10/19/20 2325 10/20/20 0643 10/20/20 1110 10/20/20 1447 10/20/20 1800 10/21/20 0043 10/21/20 0546 10/21/20 2351 10/22/20 0432 10/22/20 0903 10/22/20 1708  HGB 10.1*   < > 10.0*   < > 10.2*   < > 11.1* 11.2*  --  10.2*  --   --   HCT 32.4*   < > 32.2*   < > 33.1*   < > 35.2* 36.1*  --  33.9*  --   --   PLT 191   < > 175   < > 173   < > 180 179  --  166  --   --   APTT  --   --   --   --  27  --   --   --   --   --   --   --   LABPROT 19.3*  --  16.1*  --   --   --   --  15.1  --   --   --   --   INR 1.6*  --  1.3*  --   --   --   --  1.2  --   --   --   --   HEPARINUNFRC  --   --   --   --   --   --   --   --  <0.10*  --  <0.10* 0.13*  CREATININE  --   --   --   --   --   --   --  0.71  --   --   --   --    < > = values in this interval not displayed.     Estimated Creatinine Clearance: 231.6 mL/min (by C-G formula based on SCr of 0.71 mg/dL).   Medical History: Past Medical History:  Diagnosis Date   Anxiety    Aortic valve stenosis    Atrial fibrillation (HCC)    CHF (congestive heart failure) (HCC)    Depression    Diabetes mellitus without complication (HCC)    Hypertension     Medications:  Chronic coumadin AC therapy PTA: INR goal 2.5-3.5  PTA regimen: 2 mg every other day, rotating with 3 mg every other day Last dose 3 mg 10/18/20 Kcentra 2000 units x 1 10/19/20 Vitamin K 10 mg IV x 1 10/19/20 INR S/P reversal 1.2  Assessment: 50 y.o. male with medical history significant of s/p of mechanical  AVR 04/26/15 on coumadin, a fib presented to ED 10/19/20 with rectal bleeding. S/p Kcentra + Vit K and colonoscopy. Hgb stable.   Per provider, no further evidence of G.I. bleed. Pharmacy consult to restart warfarin. Plan to continue heparin infusion bridge.   Goal of Therapy:  INR 2.5 - 3.5 Heparin level 0.3-0.7 units/ml Monitor platelets by anticoagulation protocol: Yes  8/26 2351 HL < 0.1 (0.06 per lab), subtherapeutic @ 2150 8/27 0903 HL < 0.1, subtherapeutic @ 2450 > 2750 8/27 1708 HL = 0.13, subtherapeutic    Plan: Warfarin Patient alternates 2-3  mg at home. Will give warfarin 5 mg which is just over 1.5x home dose. Follow up INR with morning labs.  Heparin 8/27 1708 HL = 0.13, subtherapeutic  Give heparin 4000 unit bolus x 1  Increase heparin infusion to 3050 units/hr  Recheck HL in 6 hr after rate change Continue to monitor H&H and platelets  Dorothe Pea, PharmD, BCPS Clinical Pharmacist 10/22/2020 6:36 PM

## 2020-10-22 NOTE — Progress Notes (Signed)
[facility] GI inpatient brief Progress Note  Patient reportedly without further bleeding. Decision made by primary team to resume coumadin today to decrease risk of clot of mechanical aortic valve. Patient in NAD  Vitals:   10/22/20 0533 10/22/20 0737  BP: 115/65 126/62  Pulse: 80 85  Resp: 18 18  Temp: 98.4 F (36.9 C) 98 F (36.7 C)  SpO2: 90% 95%       Labs: hgb 10.2 (Range 10.2-11.2)    Impression:  Presumed colonic diverticular bleed - Resolved at present 2.   Mechanical Aortic valve - Anticoagulation resumed. 3.   Morbld obesity.   Plan:  Observe clinical course. Continue serial h/h, exams prior to expected discharge.   Will follow peripherally Please call back if we can help  Thank you  T. Madolyn Frieze, M.D. ABIM Diplomate in Gastroenterology [place] [phone] - Cell

## 2020-10-22 NOTE — Progress Notes (Signed)
[place] [facility] [place] NAME: Russell Ray    MR#:  [id number]  DATE OF BIRTH:  1970-11-15  SUBJECTIVE:   patient frustrated earlier as to why he is on liquid diet why cannot he be started on Coumadin and be on regular diet. Discussed pros and cons for it.  No rectal bleed so far.   Review of Systems  Constitutional:  Negative for chills, fever and weight loss.  HENT:  Negative for ear discharge, ear pain and nosebleeds.   Eyes:  Negative for blurred vision, pain and discharge.  Respiratory:  Negative for sputum production, shortness of breath, wheezing and stridor.   Cardiovascular:  Negative for chest pain, palpitations, orthopnea and PND.  Gastrointestinal:  Positive for blood in stool. Negative for abdominal pain, diarrhea, nausea and vomiting.  Genitourinary:  Negative for frequency and urgency.  Musculoskeletal:  Negative for back pain and joint pain.  Neurological:  Negative for sensory change, speech change, focal weakness and weakness.  Psychiatric/Behavioral:  Negative for depression and hallucinations. The patient is not nervous/anxious.   Tolerating Diet:yes Tolerating PT:   DRUG ALLERGIES:  No Known Allergies  VITALS:  Blood pressure 114/62, pulse 88, temperature 98.1 F (36.7 C), resp. rate 17, height 6' (1.829 m), weight (!) 246 kg, SpO2 92 %.  PHYSICAL EXAMINATION:   Physical Exam  GENERAL:  50 y.o.-year-old patient lying in the bed with no acute distress. Morbidly obese LUNGS: Normal breath sounds bilaterally, no wheezing, rales, rhonchi. No use of accessory muscles of respiration.  CARDIOVASCULAR: S1, S2 normal. No murmurs, rubs, or gallops.  ABDOMEN: Soft, nontender, nondistended. Bowel sounds present. No organomegaly or mass. Severe abdominal obesity EXTREMITIES: No cyanosis, clubbing or edema b/l.    NEUROLOGIC: nonfocal PSYCHIATRIC:  patient is alert and oriented x 3.  SKIN: No obvious rash, lesion, or ulcer.    LABORATORY PANEL:  CBC Recent Labs  Lab 10/22/20 0432  WBC 6.2  HGB 10.2*  HCT 33.9*  PLT 166     Chemistries  Recent Labs  Lab 10/19/20 0737 10/21/20 0546  NA 137 134*  K 4.1 3.6  CL 101 97*  CO2 25 27  GLUCOSE 117* 102*  BUN 21* 9  CREATININE 0.67 0.71  CALCIUM 9.8 9.4  AST 17  --   ALT 16  --   ALKPHOS 85  --   BILITOT 1.0  --     Cardiac Enzymes No results for input(s): TROPONINI in the last 168 hours. RADIOLOGY:  No results found. ASSESSMENT AND PLAN:  Russell Ray is a 50 y.o. male with medical history significant of s/p of mechanical AVR 04/26/15 on coumadin, a fib, HTN, DM, depression with anxiety, dCHF, anemia, former smoker, OSA, morbid obesity with BMI 73.37, who presents with rectal bleeding.  Pt states that his rectal bleeding started at 11 PM Tuesday night.  He has bright red-colored rectal bleeding. He also reports mild left lower quadrant abdominal pain.   Lower GI bleeding: Hgb stable, 11.3 --> 10.9 --> 10.0 -->10.2--> 11.1-->11.2--10.8 -- nuclear scan showed no significant active bleeding today.  --pt was accepted by [facility] for transfer, but no bed available yet. -- Dr. Alice Reichert of GI is consulted patient is status post colonoscopy --Colonoscopy--Non-bleeding internal hemorrhoids.                        - Diverticulosis in the sigmoid colon.                        -  Blood in the distal sigmoid colon.                        - Mild diverticulosis in the sigmoid colon. There was                         no evidence of diverticular bleeding.                        - The examined portion of the ileum was normal.                        - The examination was otherwise normal.  -- Since patient's hemoglobin is stable plan is to start patient on IV heparin drip and monitor over the weekend. Start clear liquid diet. If no further evidence of G.I. bleed will resume Coumadin on Monday. This was discussed with Dr. Alice Reichert. -- in the interim if he has  recurrent G.I. bleed Dr. Alice Reichert recommends transfer to [facility] for further evaluation with colorectal surgeon. This was discussed with patient and he is in agreement with plan  --8/27-- no rectal bleed so far. Patient will wants to consider starting Coumadin while he is in the hospital along with heparin bridge and see whether he bleeds or not. He understands he is at a risk of bleeding anytime even after leaving the hospital. -- discussed with Dr. Alice Reichert will resume Coumadin continue IV heparin drip delays INR is therapeutic to 2.5. Advance diet to full liquid and then soft.  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  The goal for INR 2.5-3.5 --8/27--resumed coumadin   Atrial fibrillation (HCC) -resumed Coumadin -Amiodarone, metoprolol   Anxiety -Continue home Ativan 1 mg twice daily -  Chronic diastolic CHF (congestive heart failure) ([place]): BNP 52.4.  Patient has 1+ leg edema, but no pulmonary edema on chest x-ray.  CHF seem to be compensated.  2D echo 11/12/2018 showed EF 60-35%.  Patient is not taking diuretics currently -Watch volume status closely   Essential (primary) hypertension -Hold lisinopril since BP is soft -Continue metoprolol   Type 2 diabetes mellitus without complication [facility]): -- Patient is taking Actos.  A1c 6.2 in 2017, well controlled at that time -Sliding scale insulin         DVT ppx: coumadin +heparin gtt Code Status: Full code Family Communication: Patient does not want [place] to call family. Disposition Plan:  Anticipate discharge back to previous environment Consults called:  Dr. Alice Reichert of GI is consulted. Admission status and Level of care:     progressive unit   as inpt          Status is: Inpatient   Remains inpatient appropriate because:Inpatient level of care appropriate due to  severity of illness   Dispo: The patient is from: Home              Anticipated d/c is to: to be determined              Patient currently is not medically stable to d/c.              Difficult to place patient No        TOTAL TIME TAKING CARE OF THIS PATIENT: 25 minutes.  >50%  time spent on counselling and coordination of care  Note: This dictation was prepared with Dragon dictation along with smaller phrase technology. Any transcriptional errors that result from this process are unintentional.  Fritzi Mandes M.D    [facility]   CC: Primary care physician; [name], MD Patient ID: Shepard Sadd, male   DOB: 04-Apr-1970, 50 y.o.   MRN: [id number]

## 2020-10-22 NOTE — Consult Note (Signed)
ANTICOAGULATION CONSULT NOTE   Pharmacy Consult for Heparin infusion Indication: Mechanical Aortic Valve, Afib  No Known Allergies  Patient Measurements: Height: 6' (182.9 cm) Weight: (!) 246 kg (542 lb 5.3 oz) IBW/kg (Calculated) : 77.6 Heparin Dosing Weight: 141.7 kg   Vital Signs: Temp: 98 F (36.7 C) (08/27 0737) Temp Source: Oral (08/27 0737) BP: 126/62 (08/27 0737) Pulse Rate: 85 (08/27 0737)  Labs: Recent Labs    10/19/20 1935 10/19/20 2325 10/20/20 0643 10/20/20 1110 10/20/20 1447 10/20/20 1800 10/21/20 0043 10/21/20 0546 10/21/20 2351 10/22/20 0432 10/22/20 0903  HGB 10.1*   < > 10.0*   < > 10.2*   < > 11.1* 11.2*  --  10.2*  --   HCT 32.4*   < > 32.2*   < > 33.1*   < > 35.2* 36.1*  --  33.9*  --   PLT 191   < > 175   < > 173   < > 180 179  --  166  --   APTT  --   --   --   --  27  --   --   --   --   --   --   LABPROT 19.3*  --  16.1*  --   --   --   --  15.1  --   --   --   INR 1.6*  --  1.3*  --   --   --   --  1.2  --   --   --   HEPARINUNFRC  --   --   --   --   --   --   --   --  <0.10*  --  <0.10*  CREATININE  --   --   --   --   --   --   --  0.71  --   --   --    < > = values in this interval not displayed.     Estimated Creatinine Clearance: 231.6 mL/min (by C-G formula based on SCr of 0.71 mg/dL).   Medical History: Past Medical History:  Diagnosis Date   Anxiety    Aortic valve stenosis    Atrial fibrillation (HCC)    CHF (congestive heart failure) (HCC)    Depression    Diabetes mellitus without complication (HCC)    Hypertension     Medications:  Chronic coumadin AC therapy PTA: INR goal 2.5-3.5  PTA regimen: 2 mg every other day, rotating with 3 mg every other day Last dose 3 mg 10/18/20 Kcentra 2000 units x 1 10/19/20 Vitamin K 10 mg IV x 1 10/19/20 INR S/P reversal 1.2  Assessment: 50 y.o. 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  S/p Kcentra + Vit K and colonoscopy. Hgb stable.   Pharmacy has been consulted to initiate and manage heparin infusion. Patient NPO, except CLD. Per provider, ff no further evidence of G.I. bleed possibly resuming Coumadin on Monday.   HGB 11.2>10.2  Goal of Therapy:  Heparin level 0.3-0.7 units/ml Monitor platelets by anticoagulation protocol: Yes  8/26 2351 HL < 0.1 (0.06 per lab), subtherapeutic @ 2150 8/27 0903 HL < 0.1, subtherapeutic @ 2450   Plan: Due to recent GI bleed, will increase heparin infusion rate cautiously increase heparin infusion to 2750 units/hr w/o bolus per provider  Recheck HL in 6 hr after rate change Continue to  monitor H&H and platelets  Lu Duffel, PharmD, BCPS Clinical Pharmacist 10/22/2020 10:29 AM

## 2020-10-22 NOTE — Progress Notes (Signed)
[facility] called @ 2233 and wanted to know if pt still needing bed assignment. From Internal Medicine note yesterday, yes. [facility] will call this am to follow up.

## 2020-10-23 DIAGNOSIS — K922 Gastrointestinal hemorrhage, unspecified: Secondary | ICD-10-CM | POA: Diagnosis not present

## 2020-10-23 LAB — CBC
HCT: 35.3 % — ABNORMAL LOW (ref 39.0–52.0)
Hemoglobin: 11.2 g/dL — ABNORMAL LOW (ref 13.0–17.0)
MCH: 29.6 pg (ref 26.0–34.0)
MCHC: 31.7 g/dL (ref 30.0–36.0)
MCV: 93.4 fL (ref 80.0–100.0)
Platelets: 202 10*3/uL (ref 150–400)
RBC: 3.78 MIL/uL — ABNORMAL LOW (ref 4.22–5.81)
RDW: 16.6 % — ABNORMAL HIGH (ref 11.5–15.5)
WBC: 9.3 10*3/uL (ref 4.0–10.5)
nRBC: 0.3 % — ABNORMAL HIGH (ref 0.0–0.2)

## 2020-10-23 LAB — HEPARIN LEVEL (UNFRACTIONATED)
Heparin Unfractionated: 0.28 IU/mL — ABNORMAL LOW (ref 0.30–0.70)
Heparin Unfractionated: 0.36 IU/mL (ref 0.30–0.70)
Heparin Unfractionated: 0.51 IU/mL (ref 0.30–0.70)

## 2020-10-23 LAB — GLUCOSE, CAPILLARY
Glucose-Capillary: 119 mg/dL — ABNORMAL HIGH (ref 70–99)
Glucose-Capillary: 143 mg/dL — ABNORMAL HIGH (ref 70–99)
Glucose-Capillary: 115 mg/dL — ABNORMAL HIGH (ref 70–99)
Glucose-Capillary: 145 mg/dL — ABNORMAL HIGH (ref 70–99)

## 2020-10-23 LAB — PROTIME-INR
INR: 1.4 — ABNORMAL HIGH (ref 0.8–1.2)
Prothrombin Time: 17 seconds — ABNORMAL HIGH (ref 11.4–15.2)

## 2020-10-23 MED ORDER — WARFARIN SODIUM 3 MG PO TABS
3.0000 mg | ORAL_TABLET | Freq: Once | ORAL | Status: AC
  Administered 2020-10-23: 3 mg via ORAL
  Filled 2020-10-23: qty 1

## 2020-10-23 NOTE — Progress Notes (Signed)
SUBJECTIVE: Patient is feeling much better   Vitals:   10/22/20 2028 10/22/20 2346 10/23/20 0514 10/23/20 0801  BP: 124/70 102/64 114/65 102/61  Pulse:   99 79  Resp: '18 18 19 18  '$ Temp: 98.2 F (36.8 C) 98.5 F (36.9 C) 98.5 F (36.9 C) 98.3 F (36.8 C)  TempSrc: Oral Oral    SpO2: 94% 92% 93% 94%  Weight:      Height:        Intake/Output Summary (Last 24 hours) at 10/23/2020 1115 Last data filed at 10/23/2020 1010 Gross per 24 hour  Intake 2526.01 ml  Output --  Net 2526.01 ml    LABS: Basic Metabolic Panel: Recent Labs    10/21/20 0546  NA 134*  K 3.6  CL 97*  CO2 27  GLUCOSE 102*  BUN 9  CREATININE 0.71  CALCIUM 9.4   Liver Function Tests: No results for input(s): AST, ALT, ALKPHOS, BILITOT, PROT, ALBUMIN in the last 72 hours. No results for input(s): LIPASE, AMYLASE in the last 72 hours. CBC: Recent Labs    10/20/20 2028 10/21/20 0043 10/21/20 0546 10/22/20 0432 10/23/20 0155  WBC 6.6   < > 7.2 6.2 9.3  NEUTROABS 5.1  --  5.5  --   --   HGB 10.3*   < > 11.2* 10.2* 11.2*  HCT 33.3*   < > 36.1* 33.9* 35.3*  MCV 90.7   < > 93.3 93.1 93.4  PLT 161   < > 179 166 202   < > = values in this interval not displayed.   Cardiac Enzymes: No results for input(s): CKTOTAL, CKMB, CKMBINDEX, TROPONINI in the last 72 hours. BNP: Invalid input(s): POCBNP D-Dimer: No results for input(s): DDIMER in the last 72 hours. Hemoglobin A1C: Recent Labs    10/20/20 2028  HGBA1C 6.2*   Fasting Lipid Panel: No results for input(s): CHOL, HDL, LDLCALC, TRIG, CHOLHDL, LDLDIRECT in the last 72 hours. Thyroid Function Tests: No results for input(s): TSH, T4TOTAL, T3FREE, THYROIDAB in the last 72 hours.  Invalid input(s): FREET3 Anemia Panel: No results for input(s): VITAMINB12, FOLATE, FERRITIN, TIBC, IRON, RETICCTPCT in the last 72 hours.   PHYSICAL EXAM General: Well developed, well nourished, in no acute distress HEENT:  Normocephalic and atramatic Neck:  No  JVD.  Lungs: Clear bilaterally to auscultation and percussion. Heart: HRRR . Normal S1 and S2 without gallops or murmurs.  Abdomen: Bowel sounds are positive, abdomen soft and non-tender  Msk:  Back normal, normal gait. Normal strength and tone for age. Extremities: No clubbing, cyanosis or edema.   Neuro: Alert and oriented X 3. Psych:  Good affect, responds appropriately  TELEMETRY: Sinus rhythm  ASSESSMENT AND PLAN: Status post aortic valve replacement for aortic stenosis with morbid obesity and lower GI bleed with Coumadin we started.  Patient has no active GI bleed and may have had bleeding stopped after Coumadin effect wore off.  Advise keeping INR between 2 and 2.5 and can be [facility]  If recurrent GI bleed occurs plan is to send him to [facility] to see colorectal surgery.  Principal Problem:   Lower GI bleeding Active Problems:   Morbid obesity ([place])   Iron deficiency anemia   H/O aortic valve replacement   Atrial fibrillation (HCC)   Anxiety   Chronic diastolic CHF (congestive heart failure) ([place])   Essential (primary) hypertension   Type 2 diabetes mellitus without complication ([place])   Anemia due to blood  loss    Dionisio David, MD, [facility] 10/23/2020 11:15 AM

## 2020-10-23 NOTE — Consult Note (Signed)
[place] for warfarin + heparin infusion bridge Indication: Mechanical Aortic Valve, Afib  No Known Allergies  Patient Measurements: Height: 6' (182.9 cm) Weight: (!) 246 kg (542 lb 5.3 oz) IBW/kg (Calculated) : 77.6 Heparin Dosing Weight: 141.7 kg   Vital Signs: Temp: 98.5 F (36.9 C) (08/27 2346) Temp Source: Oral (08/27 2346) BP: 102/64 (08/27 2346)  Labs: Recent Labs    10/20/20 0643 10/20/20 1110 10/20/20 1447 10/20/20 1800 10/21/20 0546 10/21/20 2351 10/22/20 0432 10/22/20 0903 10/22/20 1708 10/23/20 0155  HGB 10.0*   < > 10.2*   < > 11.2*  --  10.2*  --   --  11.2*  HCT 32.2*   < > 33.1*   < > 36.1*  --  33.9*  --   --  35.3*  PLT 175   < > 173   < > 179  --  166  --   --  202  APTT  --   --  27  --   --   --   --   --   --   --   LABPROT 16.1*  --   --   --  15.1  --   --   --   --  17.0*  INR 1.3*  --   --   --  1.2  --   --   --   --  1.4*  HEPARINUNFRC  --   --   --   --   --    < >  --  <0.10* 0.13* 0.28*  CREATININE  --   --   --   --  0.71  --   --   --   --   --    < > = values in this interval not displayed.     Estimated Creatinine Clearance: 231.6 mL/min (by C-G formula based on SCr of 0.71 mg/dL).   Medical History: Past Medical History:  Diagnosis Date   Anxiety    Aortic valve stenosis    Atrial fibrillation (HCC)    CHF (congestive heart failure) (HCC)    Depression    Diabetes mellitus without complication (HCC)    Hypertension     Medications:  Chronic coumadin AC therapy PTA: INR goal 2.5-3.5  PTA regimen: 2 mg every other day, rotating with 3 mg every other day Last dose 3 mg 10/18/20 Kcentra 2000 units x 1 10/19/20 Vitamin K 10 mg IV x 1 10/19/20 INR S/P reversal 1.2  Assessment: 50 y.o. 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 S/p Kcentra + Vit K and colonoscopy. Hgb stable.   Per provider,  no further evidence of G.I. bleed. Pharmacy consult to restart warfarin. Plan to continue heparin infusion bridge.   Goal of Therapy:  INR 2.5 - 3.5 Heparin level 0.3-0.7 units/ml Monitor platelets by anticoagulation protocol: Yes  8/26 2351 HL < 0.1 (0.06 per lab), subtherapeutic @ 2150 8/27 0903 HL < 0.1, subtherapeutic @ 2450 8/28 0155 HL 0.28, subtherapeutic @ 3050   Plan: Warfarin Patient alternates 2-3 mg at home. Will give warfarin 5 mg which is just over 1.5x home dose. Follow up INR with morning labs.  Heparin Increase heparin infusion to 3350 units/hr w/o bolus per provider Recheck HL in 6 hr after rate change Continue to monitor H&H and platelets  Renda Rolls, PharmD, [facility] 10/23/2020 2:51  AM

## 2020-10-23 NOTE — Consult Note (Signed)
[place] for warfarin + heparin infusion bridge Indication: Mechanical Aortic Valve, Afib  No Known Allergies  Patient Measurements: Height: 6' (182.9 cm) Weight: (!) 246 kg (542 lb 5.3 oz) IBW/kg (Calculated) : 77.6 Heparin Dosing Weight: 141.7 kg   Vital Signs: Temp: 98.3 F (36.8 C) (08/28 0801) Temp Source: Oral (08/27 2346) BP: 102/61 (08/28 0801) Pulse Rate: 79 (08/28 0801)  Labs: Recent Labs    10/20/20 1447 10/20/20 1800 10/21/20 0546 10/21/20 2351 10/22/20 0432 10/22/20 0903 10/22/20 1708 10/23/20 0155  HGB 10.2*   < > 11.2*  --  10.2*  --   --  11.2*  HCT 33.1*   < > 36.1*  --  33.9*  --   --  35.3*  PLT 173   < > 179  --  166  --   --  202  APTT 27  --   --   --   --   --   --   --   LABPROT  --   --  15.1  --   --   --   --  17.0*  INR  --   --  1.2  --   --   --   --  1.4*  HEPARINUNFRC  --   --   --    < >  --  <0.10* 0.13* 0.28*  CREATININE  --   --  0.71  --   --   --   --   --    < > = values in this interval not displayed.     Estimated Creatinine Clearance: 231.6 mL/min (by C-G formula based on SCr of 0.71 mg/dL).   Medical History: Past Medical History:  Diagnosis Date   Anxiety    Aortic valve stenosis    Atrial fibrillation (HCC)    CHF (congestive heart failure) (HCC)    Depression    Diabetes mellitus without complication (HCC)    Hypertension     Medications:  Chronic coumadin AC therapy PTA: INR goal 2.5-3.5  PTA regimen: 2 mg every other day, rotating with 3 mg every other day Last dose pta 3 mg 10/18/20 Kcentra 2000 units x 1 10/19/20 Vitamin K 10 mg IV x 1 10/19/20 INR S/P reversal 1.2  Assessment: 50 y.o. 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 S/p Kcentra + Vit K and colonoscopy. Hgb stable.   Per provider, no further evidence of G.I. bleed.   Pharmacy consult to restart warfarin 8/27.   Plan to continue heparin infusion bridge.  Goal of Therapy:  INR 2.5 - 3.5 Heparin level 0.3-0.7 units/ml Monitor platelets by anticoagulation protocol: Yes   Heparin Status 8/26 2351 HL < 0.1 (0.06 per lab), subtherapeutic @ 2150 8/27 0903 HL < 0.1, subtherapeutic @ 2450 8/28 0155 HL 0.28, subtherapeutic @ 3050 8/28 0850 HL 0.36, therapeutic x 1  Warfarin Status   INR Warfarin Dose 8/27  1.2 '5mg'$  8/28 1.4 '3mg'$      Plan: Warfarin Patient alternates 2-3 mg at home.   Will give warfarin 3 mg following the increase in INR seen with am labs - given recent bleed and heparin bridge, will proceed cautiously.   Follow up INR/CBC with morning labs.  Heparin Continue heparin infusion @ 3350 units/hr  Recheck HL in 6 hr for confirmation Continue to monitor H&H and platelets  Lu Duffel, PharmD,  BCPS Clinical Pharmacist 10/23/2020 9:01 AM

## 2020-10-23 NOTE — Plan of Care (Signed)
  Problem: Health Behavior/Discharge Planning: Goal: Ability to manage health-related needs will improve Outcome: Progressing   Problem: Clinical Measurements: Goal: Respiratory complications will improve Outcome: Progressing   Problem: Clinical Measurements: Goal: Cardiovascular complication will be avoided Outcome: Progressing   Problem: Activity: Goal: Risk for activity intolerance will decrease Outcome: Progressing   Problem: Pain Managment: Goal: General experience of comfort will improve Outcome: Progressing   

## 2020-10-23 NOTE — Progress Notes (Signed)
[facility] NAME: Russell Ray    MR#:  [id number]  DATE OF BIRTH:  Apr 16, 1970  SUBJECTIVE:   No rectal bleed so far. Wants to eat regular food   Review of Systems  Constitutional:  Negative for chills, fever and weight loss.  HENT:  Negative for ear discharge, ear pain and nosebleeds.   Eyes:  Negative for blurred vision, pain and discharge.  Respiratory:  Negative for sputum production, shortness of breath, wheezing and stridor.   Cardiovascular:  Negative for chest pain, palpitations, orthopnea and PND.  Gastrointestinal:  Positive for blood in stool. Negative for abdominal pain, diarrhea, nausea and vomiting.  Genitourinary:  Negative for frequency and urgency.  Musculoskeletal:  Negative for back pain and joint pain.  Neurological:  Negative for sensory change, speech change, focal weakness and weakness.  Psychiatric/Behavioral:  Negative for depression and hallucinations. The patient is not nervous/anxious.   Tolerating Diet:yes Tolerating PT:   DRUG ALLERGIES:  No Known Allergies  VITALS:  Blood pressure 111/74, pulse 91, temperature 98.4 F (36.9 C), temperature source Oral, resp. rate 18, height 6' (1.829 m), weight (!) 246 kg, SpO2 95 %.  PHYSICAL EXAMINATION:   Physical Exam  GENERAL:  50 y.o.-year-old patient lying in the bed with no acute distress. Morbidly obese LUNGS: Normal breath sounds bilaterally, no wheezing, rales, rhonchi. No use of accessory muscles of respiration.  CARDIOVASCULAR: S1, S2 normal. No murmurs, rubs, or gallops.  ABDOMEN: Soft, nontender, nondistended. Bowel sounds present. No organomegaly or mass. Severe abdominal obesity EXTREMITIES: No cyanosis, clubbing or edema b/l.    NEUROLOGIC: nonfocal PSYCHIATRIC:  patient is alert and oriented x 3.  SKIN: No obvious rash, lesion, or ulcer.   LABORATORY PANEL:  CBC Recent Labs  Lab 10/23/20 0155  WBC 9.3  HGB 11.2*  HCT 35.3*  PLT 202      Chemistries  Recent Labs  Lab 10/19/20 0737 10/21/20 0546  NA 137 134*  K 4.1 3.6  CL 101 97*  CO2 25 27  GLUCOSE 117* 102*  BUN 21* 9  CREATININE 0.67 0.71  CALCIUM 9.8 9.4  AST 17  --   ALT 16  --   ALKPHOS 85  --   BILITOT 1.0  --     Cardiac Enzymes No results for input(s): TROPONINI in the last 168 hours. RADIOLOGY:  No results found. ASSESSMENT AND PLAN:  Russell Ray is a 50 y.o. male with medical history significant of s/p of mechanical AVR 04/26/15 on coumadin, a fib, HTN, DM, depression with anxiety, dCHF, anemia, former smoker, OSA, morbid obesity with BMI 73.37, who presents with rectal bleeding.  Pt states that his rectal bleeding started at 11 PM Tuesday night.  He has bright red-colored rectal bleeding. He also reports mild left lower quadrant abdominal pain.   Lower GI bleeding: Hgb stable, 11.3 --> 10.9 --> 10.0 -->10.2--> 11.1-->11.2--10.8 -- nuclear scan showed no significant active bleeding today.  --pt was accepted by [facility] for transfer, but no bed available yet. -- Dr. Alice Reichert of GI is consulted patient is status post colonoscopy --Colonoscopy--Non-bleeding internal hemorrhoids.                        - Diverticulosis in the sigmoid colon.                        - Blood in the distal sigmoid  colon.                        - Mild diverticulosis in the sigmoid colon. There was                         no evidence of diverticular bleeding.                        - The examined portion of the ileum was normal.                        - The examination was otherwise normal.  -- Since patient's hemoglobin is stable plan is to start patient on IV heparin drip and monitor over the weekend. Start clear liquid diet. If no further evidence of G.I. bleed will resume Coumadin on Monday. This was discussed with Dr. Alice Reichert. -- in the interim if he has recurrent G.I. bleed Dr. Alice Reichert recommends transfer to [facility] for further evaluation with colorectal  surgeon. This was discussed with patient and he is in agreement with plan  --8/27-- no rectal bleed so far. Patient will wants to consider starting Coumadin while he is in the hospital along with heparin bridge and see whether he bleeds or not. He understands he is at a risk of bleeding anytime even after leaving the hospital. -- discussed with Dr. Alice Reichert will resume Coumadin continue IV heparin drip  till INR between 2-2.5 (per Dr Laurelyn Sickle note) --Advance diet to fregualr diet (pt refuses heart healthy carb diet)  Iron deficiency anemia and anemia due to blood loss -continue iron supplement   Morbid obesity ([place]): BMI [id number] --complicates overall prognosis   H/O aortic valve replacement: Mechanical valve replacement 04/26/2015.  The goal for INR 2.5-3.5 --8/27--resumed coumadin   Atrial fibrillation (HCC) -resumed Coumadin -Amiodarone, metoprolol   Anxiety -Continue home Ativan 1 mg twice daily  Chronic diastolic CHF (congestive heart failure) ([place]): BNP 52.4.  Patient has 1+ leg edema, but no pulmonary edema on chest x-ray.  CHF seem to be compensated.  2D echo 11/12/2018 showed EF 60-35%.  Patient is not taking diuretics currently -Watch volume status closely   Essential (primary) hypertension -Hold lisinopril since BP is soft -Continue metoprolol   Type 2 diabetes mellitus without complication [facility]): -- Patient is taking Actos.  A1c 6.2 in 2017, well controlled at that time -Sliding scale insulin         DVT ppx: coumadin +heparin gtt Code Status: Full code Family Communication: Patient does not want [place] to call family. Disposition Plan:  Anticipate discharge back to previous environment Consults called:  Dr. Alice Reichert of GI is consulted. Admission status and Level of care:     progressive unit   as inpt          Status is: Inpatient   Remains inpatient appropriate because:Inpatient level of care appropriate due to severity of illness   Dispo: The patient is from: Home               Anticipated d/c is to: to be determined              Patient currently is  medically stable waiting for INR to be therapeutic              Difficult to place patient No        TOTAL TIME TAKING CARE OF THIS PATIENT:  25 minutes.  >50% time spent on counselling and coordination of care  Note: This dictation was prepared with Dragon dictation along with smaller phrase technology. Any transcriptional errors that result from this process are unintentional.  Fritzi Mandes M.D    [facility]   CC: Primary care physician; [name], MD Patient ID: Russell Ray, male   DOB: Feb 01, 1971, 50 y.o.   MRN: [id number]

## 2020-10-23 NOTE — Consult Note (Signed)
[place] for warfarin + heparin infusion bridge Indication: Mechanical Aortic Valve, Afib  No Known Allergies  Patient Measurements: Height: 6' (182.9 cm) Weight: (!) 246 kg (542 lb 5.3 oz) IBW/kg (Calculated) : 77.6 Heparin Dosing Weight: 141.7 kg   Vital Signs: Temp: 97.8 F (36.6 C) (08/28 1605) Temp Source: Oral (08/28 1605) BP: 133/75 (08/28 1605) Pulse Rate: 80 (08/28 1605)  Labs: Recent Labs    10/21/20 0546 10/21/20 2351 10/22/20 0432 10/22/20 0903 10/23/20 0155 10/23/20 0850 10/23/20 1609  HGB 11.2*  --  10.2*  --  11.2*  --   --   HCT 36.1*  --  33.9*  --  35.3*  --   --   PLT 179  --  166  --  202  --   --   LABPROT 15.1  --   --   --  17.0*  --   --   INR 1.2  --   --   --  1.4*  --   --   HEPARINUNFRC  --    < >  --    < > 0.28* 0.36 0.51  CREATININE 0.71  --   --   --   --   --   --    < > = values in this interval not displayed.     Estimated Creatinine Clearance: 231.6 mL/min (by C-G formula based on SCr of 0.71 mg/dL).   Medical History: Past Medical History:  Diagnosis Date   Anxiety    Aortic valve stenosis    Atrial fibrillation (HCC)    CHF (congestive heart failure) (HCC)    Depression    Diabetes mellitus without complication (HCC)    Hypertension     Medications:  Chronic coumadin AC therapy PTA: INR goal 2.5-3.5  PTA regimen: 2 mg every other day, rotating with 3 mg every other day Last dose pta 3 mg 10/18/20 Kcentra 2000 units x 1 10/19/20 Vitamin K 10 mg IV x 1 10/19/20 INR S/P reversal 1.2  Assessment: 50 y.o. 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 S/p Kcentra + Vit K and colonoscopy. Hgb stable.   Per provider, no further evidence of G.I. bleed.   Pharmacy consult to restart warfarin 8/27.  Plan to continue heparin infusion bridge.  Goal of Therapy:  INR 2.5 - 3.5 Heparin level  0.3-0.7 units/ml Monitor platelets by anticoagulation protocol: Yes   Heparin Status 8/26 2351 HL < 0.1 (0.06 per lab), subtherapeutic @ 2150 8/27 0903 HL < 0.1, subtherapeutic @ 2450 8/28 0155 HL 0.28, subtherapeutic @ 3050 8/28 0850 HL 0.36, therapeutic x 1 8/28 1609 HL 0.51, therapeutic x 2   Warfarin Status   INR Warfarin Dose 8/27  1.2 '5mg'$  8/28 1.4 '3mg'$      Plan: Warfarin Patient alternates 2-3 mg at home.   Will give warfarin 3 mg following the increase in INR seen with am labs - given recent bleed and heparin bridge, will proceed cautiously.   Follow up INR/CBC with morning labs.  Heparin  Heparin remains therapeutic  Continue heparin infusion @ 3350 units/hr  Recheck HL with AM labs Continue to monitor H&H and platelets  Dorothe Pea, PharmD, BCPS Clinical Pharmacist 10/23/2020 4:35 PM

## 2020-10-23 NOTE — Progress Notes (Signed)
Patient called out wanting to speak with Doctor Posey Pronto very upset that he had in a diabetic heart healthy diet explained to pt why diet was ordered. Spoke with doctor Posey Pronto since pt refused to eat ordered diet agreed to change to regular

## 2020-10-24 ENCOUNTER — Encounter: Payer: Self-pay | Admitting: Internal Medicine

## 2020-10-24 DIAGNOSIS — K922 Gastrointestinal hemorrhage, unspecified: Secondary | ICD-10-CM | POA: Diagnosis not present

## 2020-10-24 LAB — HEPARIN LEVEL (UNFRACTIONATED): Heparin Unfractionated: 0.51 IU/mL (ref 0.30–0.70)

## 2020-10-24 LAB — CBC
HCT: 36 % — ABNORMAL LOW (ref 39.0–52.0)
Hemoglobin: 10.8 g/dL — ABNORMAL LOW (ref 13.0–17.0)
MCH: 27.8 pg (ref 26.0–34.0)
MCHC: 30 g/dL (ref 30.0–36.0)
MCV: 92.8 fL (ref 80.0–100.0)
Platelets: 203 10*3/uL (ref 150–400)
RBC: 3.88 MIL/uL — ABNORMAL LOW (ref 4.22–5.81)
RDW: 16.9 % — ABNORMAL HIGH (ref 11.5–15.5)
WBC: 6.8 10*3/uL (ref 4.0–10.5)
nRBC: 0 % (ref 0.0–0.2)

## 2020-10-24 LAB — GLUCOSE, CAPILLARY
Glucose-Capillary: 130 mg/dL — ABNORMAL HIGH (ref 70–99)
Glucose-Capillary: 174 mg/dL — ABNORMAL HIGH (ref 70–99)

## 2020-10-24 LAB — PROTIME-INR
INR: 1.7 — ABNORMAL HIGH (ref 0.8–1.2)
Prothrombin Time: 19.7 seconds — ABNORMAL HIGH (ref 11.4–15.2)

## 2020-10-24 MED ORDER — WARFARIN SODIUM 3 MG PO TABS
3.0000 mg | ORAL_TABLET | Freq: Once | ORAL | Status: DC
  Filled 2020-10-24: qty 1

## 2020-10-24 MED ORDER — DOCUSATE SODIUM 100 MG PO CAPS
100.0000 mg | ORAL_CAPSULE | Freq: Two times a day (BID) | ORAL | Status: DC
  Administered 2020-10-24: 100 mg via ORAL
  Filled 2020-10-24: qty 1

## 2020-10-24 MED ORDER — DOCUSATE SODIUM 100 MG PO CAPS: 100.0000 mg | ORAL_CAPSULE | Freq: Two times a day (BID) | ORAL | 0 refills | Status: DC

## 2020-10-24 MED ORDER — POLYETHYLENE GLYCOL 3350 17 G PO PACK
17.0000 g | PACK | Freq: Every day | ORAL | Status: DC
  Administered 2020-10-24: 17 g via ORAL
  Filled 2020-10-24: qty 1

## 2020-10-24 MED ORDER — PANTOPRAZOLE SODIUM 40 MG PO TBEC
40.0000 mg | DELAYED_RELEASE_TABLET | Freq: Every day | ORAL | 0 refills | Status: DC
Start: 2020-10-25 — End: 2022-08-06

## 2020-10-24 MED ORDER — POLYETHYLENE GLYCOL 3350 17 G PO PACK
17.0000 g | PACK | Freq: Every day | ORAL | 0 refills | Status: DC
Start: 2020-10-25 — End: 2022-08-06

## 2020-10-24 NOTE — Discharge Summary (Addendum)
Russell Ray NAME: Russell Ray    MR#:  [id number]  DATE OF BIRTH:  05/28/1970  DATE OF ADMISSION:  10/19/2020 ADMITTING PHYSICIAN: Ivor Costa, MD  DATE OF DISCHARGE: 10/24/2020  PRIMARY CARE PHYSICIAN: [name], MD    ADMISSION DIAGNOSIS:  Rectal bleeding [K62.5] Lower GI bleeding [K92.2] Lower GI bleed [K92.2]  DISCHARGE DIAGNOSIS:  Hematochezia/Rectal bleed suspected due to diverticular in the setting of constipation and pt being on warfarin  SECONDARY DIAGNOSIS:   Past Medical History:  Diagnosis Date   Anxiety    Aortic valve stenosis    Atrial fibrillation (HCC)    CHF (congestive heart failure) ([place])    Depression    Diabetes mellitus without complication ([place])    Hypertension     HOSPITAL COURSE:   Russell Ray is a 50 y.o. male with medical history significant of s/p of mechanical AVR 04/26/15 on coumadin, a fib, HTN, DM, depression with anxiety, dCHF, anemia, former smoker, OSA, morbid obesity with BMI 73.37, who presents with rectal bleeding.  Pt states that his rectal bleeding started at 11 PM Tuesday night.  He has bright red-colored rectal bleeding. He also reports mild left lower quadrant abdominal pain.    Lower GI bleeding: Hgb stable, 11.3 --> 10.9 --> 10.0 -->10.2--> 11.1-->11.2--10.8 -- nuclear scan showed no significant active bleeding today.  --pt was accepted by [facility] for transfer, but no bed available yet. -- Dr. Alice Reichert of GI is consulted patient is status post colonoscopy --Colonoscopy--Non-bleeding internal hemorrhoids.                        - Diverticulosis in the sigmoid colon.                        - Blood in the distal sigmoid colon.                        - Mild diverticulosis in the sigmoid colon. There was                         no evidence of diverticular bleeding.Hematin (altered blood/coffee-ground-like material) was found in the       distal sigmoid colon. Lavage  of the area was performed using a moderate                         - The examined portion of the ileum was normal.                        - The examination was otherwise normal.   -- Since patient's hemoglobin is stable plan is to start patient on IV heparin drip and monitor over the weekend. Start clear liquid diet. If no further evidence of G.I. bleed will resume Coumadin on Monday. This was discussed with Dr. Alice Reichert. -- in the interim if he has recurrent G.I. bleed Dr. Alice Reichert recommends transfer to [facility] for further evaluation with colorectal surgeon. This was discussed with patient and he is in agreement with plan  --8/27-- no rectal bleed so far. Patient will wants to consider starting Coumadin while he is in the hospital along with heparin bridge and see whether he bleeds or not. He understands he is at a risk of bleeding anytime even after leaving the  hospital. -- discussed with Dr. Alice Reichert will resume Coumadin continue IV heparin drip  till INR between 2-2.5 (per Dr Laurelyn Sickle note) --Advance diet to fregualr diet (pt refuses heart healthy carb diet) --8/29-- patient has not had any rectal bleed in two days. Hemoglobin stable. He is tolerating regular diet. He is on stool softener. He does not want to wait till he has a BM. Patient is INR today's 1.7. He tells me he will manage his Coumadin at home he has a kit where he does his own blood work. He does not want to wait till his INR is therapeutic.  Iron deficiency anemia and anemia due to blood loss -continue iron supplement   Morbid obesity ([place]): BMI 91.69 --complicates overall prognosis   H/O aortic valve replacement: Mechanical valve replacement 04/26/2015.  The goal for INR 2.5-3.5 --8/27--resumed coumadin   Atrial fibrillation (HCC) -resumed Coumadin -Amiodarone, metoprolol   Anxiety -Continue home Ativan 1 mg twice daily   Chronic diastolic CHF (congestive heart failure) ([place]): BNP 52.4.  Patient has 1+ leg edema, but no pulmonary  edema on chest x-ray.  CHF seem to be compensated.  2D echo 11/12/2018 showed EF 60-35%.  Patient is not taking diuretics currently -Watch volume status closely   Essential (primary) hypertension -Hold lisinopril since BP is soft -Continue metoprolol   Type 2 diabetes mellitus without complication [facility]): -- Patient is taking Actos.  A1c 6.2 in 2017, well controlled at that time -Sliding scale insulin         DVT ppx: coumadin +heparin gtt Code Status: Full code Family Communication: Patient does not want [place] to call family. Disposition Plan:  Anticipate discharge back to previous environment Consults called:  Dr. Alice Reichert of GI is consulted. Admission status and Level of care:     progressive unit   as inpt          Status is: Inpatient  Dispo: The patient is from: Home              Anticipated d/c is to: home              Patient currently is  medically stable waiting for INR to be therapeutic however patient wants to leave today and will manage his own INR at home.          CONSULTS OBTAINED:    DRUG ALLERGIES:  No Known Allergies  DISCHARGE MEDICATIONS:   Allergies as of 10/24/2020   No Known Allergies      Medication List     STOP taking these medications    aspirin EC 81 MG tablet       TAKE these medications    acetaminophen 325 MG tablet Commonly known as: TYLENOL Take 650 mg by mouth every 4 (four) hours as needed for moderate pain.   amiodarone 200 MG tablet Commonly known as: PACERONE 2 tablets twice a day for 5 days then 1 tablet twice a day for 5 days then one tablet daily afterwards What changed:  how much to take how to take this when to take this additional instructions   cyanocobalamin 1000 MCG tablet Take 1,000 mcg by mouth daily.   cyclobenzaprine 5 MG tablet Commonly known as: FLEXERIL Take 5 mg by mouth 3 (three) times daily as needed for muscle spasms.   docusate sodium 100 MG capsule Commonly known as: COLACE Take 1 capsule  (100 mg total) by mouth 2 (two) times daily.   Iron (Ferrous Sulfate) 142 (45 Fe) MG Tbcr  Take 1 tablet by mouth daily.   lisinopril 20 MG tablet Commonly known as: ZESTRIL Take 20 mg by mouth daily.   LORazepam 1 MG tablet Commonly known as: ATIVAN Take 1 mg by mouth 2 (two) times daily.   magnesium oxide 400 (240 Mg) MG tablet Commonly known as: MAG-OX Take 1 tablet (400 mg total) by mouth every morning. What changed:  how much to take when to take this   metoprolol succinate 50 MG 24 hr tablet Commonly known as: TOPROL-XL Take 50 mg by mouth daily. Take with or immediately following a meal.   ondansetron 4 MG tablet Commonly known as: ZOFRAN Take 4 mg by mouth 2 (two) times daily.   pantoprazole 40 MG tablet Commonly known as: PROTONIX Take 1 tablet (40 mg total) by mouth daily. Start taking on: October 25, 2020   pioglitazone 45 MG tablet Commonly known as: ACTOS Take 45 mg by mouth daily.   polyethylene glycol 17 g packet Commonly known as: MIRALAX / GLYCOLAX Take 17 g by mouth daily. Start taking on: October 25, 2020   traMADol-acetaminophen 37.5-325 MG tablet Commonly known as: ULTRACET Take 2 tablets by mouth 2 (two) times daily as needed for moderate pain.   warfarin 3 MG tablet Commonly known as: COUMADIN Take 3 mg by mouth every other day.   warfarin 2 MG tablet Commonly known as: COUMADIN Take 2 mg by mouth every other day.        If you experience worsening of your admission symptoms, develop shortness of breath, life threatening emergency, suicidal or homicidal thoughts you must seek medical attention immediately by calling 911 or calling your MD immediately  if symptoms less severe.  You Must read complete instructions/literature along with all the possible adverse reactions/side effects for all the Medicines you take and that have been prescribed to you. Take any new Medicines after you have completely understood and accept all the possible  adverse reactions/side effects.   Please note  You were cared for by a hospitalist during your hospital stay. If you have any questions about your discharge medications or the care you received while you were in the hospital after you are discharged, you can call the unit and asked to speak with the hospitalist on call if the hospitalist that took care of you is not available. Once you are discharged, your primary care physician will handle any further medical issues. Please note that NO REFILLS for any discharge medications will be authorized once you are discharged, as it is imperative that you return to your primary care physician (or establish a relationship with a primary care physician if you do not have one) for your aftercare needs so that they can reassess your need for medications and monitor your lab values. Today   SUBJECTIVE   no rectal bleed  VITAL SIGNS:  Blood pressure 140/90, pulse (!) 103, temperature 98.1 F (36.7 C), resp. rate 16, height 6' (1.829 m), weight (!) 246 kg, SpO2 94 %.  I/O:   Intake/Output Summary (Last 24 hours) at 10/24/2020 1350 Last data filed at 10/24/2020 1120 Gross per 24 hour  Intake 1320 ml  Output 1 ml  Net 1319 ml    PHYSICAL EXAMINATION:  GENERAL:  50 y.o.-year-old patient lying in the bed with no acute distress. Morbidly obese LUNGS: Normal breath sounds bilaterally, no wheezing, rales, rhonchi. No use of accessory muscles of respiration.  CARDIOVASCULAR: S1, S2 normal. No murmurs, rubs, or gallops.  ABDOMEN: Soft, nontender, nondistended.  Bowel sounds present. No organomegaly or mass. Severe abdominal obesity EXTREMITIES: No cyanosis, clubbing or edema b/l.    NEUROLOGIC: nonfocal PSYCHIATRIC:  patient is alert and oriented x 3.  SKIN: No obvious rash, lesion, or ulcer.  DATA REVIEW:   CBC  Recent Labs  Lab 10/24/20 0432  WBC 6.8  HGB 10.8*  HCT 36.0*  PLT 203    Chemistries  Recent Labs  Lab 10/19/20 0737 10/21/20 0546   NA 137 134*  K 4.1 3.6  CL 101 97*  CO2 25 27  GLUCOSE 117* 102*  BUN 21* 9  CREATININE 0.67 0.71  CALCIUM 9.8 9.4  AST 17  --   ALT 16  --   ALKPHOS 85  --   BILITOT 1.0  --     Microbiology Results   Recent Results (from the past 240 hour(s))  Resp Panel by RT-PCR (Flu A&B, Covid) Nasopharyngeal Swab     Status: None   Collection Time: 10/19/20  3:38 PM   Specimen: Nasopharyngeal Swab; Nasopharyngeal(NP) swabs in vial transport medium  Result Value Ref Range Status   SARS Coronavirus 2 by RT PCR NEGATIVE NEGATIVE Final    Comment: (NOTE) SARS-CoV-2 target nucleic acids are NOT DETECTED.  The SARS-CoV-2 RNA is generally detectable in upper respiratory specimens during the acute phase of infection. The lowest concentration of SARS-CoV-2 viral copies this assay can detect is 138 copies/mL. A negative result does not preclude SARS-Cov-2 infection and should not be used as the sole basis for treatment or other patient management decisions. A negative result may occur with  improper specimen collection/handling, submission of specimen other than nasopharyngeal swab, presence of viral mutation(s) within the areas targeted by this assay, and inadequate number of viral copies(<138 copies/mL). A negative result must be combined with clinical observations, patient history, and epidemiological information. The expected result is Negative.  Fact Sheet for Patients:  EntrepreneurPulse.com.au  Fact Sheet for Healthcare Providers:  IncredibleEmployment.be  This test is no t yet approved or cleared by the [place] FDA and  has been authorized for detection and/or diagnosis of SARS-CoV-2 by FDA under an Emergency Use Authorization (EUA). This EUA will remain  in effect (meaning this test can be used) for the duration of the COVID-19 declaration under Section 564(b)(1) of the Act, 21 U.S.C.section 360bbb-3(b)(1), unless the authorization is  terminated  or revoked sooner.       Influenza A by PCR NEGATIVE NEGATIVE Final   Influenza B by PCR NEGATIVE NEGATIVE Final    Comment: (NOTE) The Xpert Xpress SARS-CoV-2/FLU/RSV plus assay is intended as an aid in the diagnosis of influenza from Nasopharyngeal swab specimens and should not be used as a sole basis for treatment. Nasal washings and aspirates are unacceptable for Xpert Xpress SARS-CoV-2/FLU/RSV testing.  Fact Sheet for Patients: EntrepreneurPulse.com.au  Fact Sheet for Healthcare Providers: IncredibleEmployment.be  This test is not yet approved or cleared by the [place] FDA and has been authorized for detection and/or diagnosis of SARS-CoV-2 by FDA under an Emergency Use Authorization (EUA). This EUA will remain in effect (meaning this test can be used) for the duration of the COVID-19 declaration under Section 564(b)(1) of the Act, 21 U.S.C. section 360bbb-3(b)(1), unless the authorization is terminated or revoked.  Performed at [facility], [address] [place], [place] [address]     RADIOLOGY:  No results found.   CODE STATUS:     Code Status Orders  (From admission, onward)  Start     Ordered   10/20/20 1615  Full code  Continuous        10/20/20 1614           Code Status History     Date Active Date Inactive Code Status Order ID Comments User Context   05/13/2017 1318 05/15/2017 1445 Full Code [id number]  Fritzi Mandes, MD Inpatient   12/02/2016 0032 12/03/2016 1436 Full Code [id number]  Jules Husbands, MD Inpatient   05/17/2015 0957 05/17/2015 1828 Full Code [id number]  Bettey Costa, MD ED   05/04/2015 0938 05/05/2015 2039 Full Code [id number]  Daune Perch, NP Inpatient   05/03/2015 1734 05/04/2015 0938 Full Code [id number]  Theodoro Grist, MD ED   04/22/2015 0737 04/22/2015 2130 Full Code [id number]  Hower, Aaron Mose, MD ED   03/18/2015 1414 03/19/2015 1515 Full Code [id number]  Fritzi Mandes,  MD Inpatient        TOTAL TIME TAKING CARE OF THIS PATIENT: 40 minutes.    Fritzi Mandes M.D  [facility]    CC: Primary care physician; [name], MD

## 2020-10-24 NOTE — Consult Note (Signed)
[place] for warfarin + heparin infusion bridge Indication: Mechanical Aortic Valve, Afib  No Known Allergies  Patient Measurements: Height: 6' (182.9 cm) Weight: (!) 246 kg (542 lb 5.3 oz) IBW/kg (Calculated) : 77.6 Heparin Dosing Weight: 141.7 kg   Vital Signs: Temp: 98 F (36.7 C) (08/29 0408) BP: 139/71 (08/29 0408) Pulse Rate: 92 (08/29 0408)  Labs: Recent Labs    10/22/20 0432 10/22/20 0903 10/23/20 0155 10/23/20 0850 10/23/20 1609 10/24/20 0432  HGB 10.2*  --  11.2*  --   --  10.8*  HCT 33.9*  --  35.3*  --   --  36.0*  PLT 166  --  202  --   --  203  LABPROT  --   --  17.0*  --   --  19.7*  INR  --   --  1.4*  --   --  1.7*  HEPARINUNFRC  --    < > 0.28* 0.36 0.51 0.51   < > = values in this interval not displayed.     Estimated Creatinine Clearance: 231.6 mL/min (by C-G formula based on SCr of 0.71 mg/dL).   Medical History: Past Medical History:  Diagnosis Date   Anxiety    Aortic valve stenosis    Atrial fibrillation (HCC)    CHF (congestive heart failure) (HCC)    Depression    Diabetes mellitus without complication (HCC)    Hypertension     Medications:  Chronic coumadin AC therapy PTA: INR goal 2.5-3.5  PTA regimen: 2 mg every other day, rotating with 3 mg every other day Last dose pta 3 mg 10/18/20 Kcentra 2000 units x 1 10/19/20 Vitamin K 10 mg IV x 1 10/19/20 INR S/P reversal 1.2  Assessment: 50 y.o. 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 S/p Kcentra + Vit K and colonoscopy. Hgb stable.   Per provider, no further evidence of G.I. bleed.   Pharmacy consult to restart warfarin 8/27.  Plan to continue heparin infusion bridge.  Goal of Therapy:  INR 2.5 - 3.5 Heparin level 0.3-0.7 units/ml Monitor platelets by anticoagulation protocol: Yes   Heparin Status 8/26 2351 HL < 0.1 (0.06 per lab),  subtherapeutic @ 2150 8/27 0903 HL < 0.1, subtherapeutic @ 2450 8/28 0155 HL 0.28, subtherapeutic @ 3050 8/28 0850 HL 0.36, therapeutic x 1 8/28 1609 HL 0.51, therapeutic x 2  8/29 0432 HL 0.51, therapeutic x 3  Warfarin Status   INR Warfarin Dose 8/27  1.2 '5mg'$  8/28 1.4 '3mg'$      Plan: Warfarin Patient alternates 2-3 mg at home.   Will give warfarin 3 mg following the increase in INR seen with am labs - given recent bleed and heparin bridge, will proceed cautiously.   Follow up INR/CBC with morning labs.  Heparin  Heparin remains therapeutic  Continue heparin infusion @ 3350 units/hr  Recheck HL with AM labs Continue to monitor H&H and platelets  Renda Rolls, PharmD, [facility] 10/24/2020 6:25 AM

## 2020-10-24 NOTE — Discharge Instructions (Signed)
Patient to check his INR and PT daily and adjust her dose Coumadin. He is to call his primary care or cardiologist if needed

## 2020-10-24 NOTE — Consult Note (Signed)
[place] for warfarin + heparin infusion bridge Indication: Mechanical Aortic Valve, Afib  No Known Allergies  Patient Measurements: Height: 6' (182.9 cm) Weight: (!) 246 kg (542 lb 5.3 oz) IBW/kg (Calculated) : 77.6 Heparin Dosing Weight: 141.7 kg   Vital Signs: Temp: 98.1 F (36.7 C) (08/29 0731) BP: 133/81 (08/29 0731) Pulse Rate: 102 (08/29 0731)  Labs: Recent Labs    10/22/20 0432 10/22/20 0903 10/23/20 0155 10/23/20 0850 10/23/20 1609 10/24/20 0432  HGB 10.2*  --  11.2*  --   --  10.8*  HCT 33.9*  --  35.3*  --   --  36.0*  PLT 166  --  202  --   --  203  LABPROT  --   --  17.0*  --   --  19.7*  INR  --   --  1.4*  --   --  1.7*  HEPARINUNFRC  --    < > 0.28* 0.36 0.51 0.51   < > = values in this interval not displayed.     Estimated Creatinine Clearance: 231.6 mL/min (by C-G formula based on SCr of 0.71 mg/dL).   Medical History: Past Medical History:  Diagnosis Date   Anxiety    Aortic valve stenosis    Atrial fibrillation (HCC)    CHF (congestive heart failure) (HCC)    Depression    Diabetes mellitus without complication (HCC)    Hypertension     Medications:  Chronic coumadin AC therapy PTA: INR goal 2.5-3.5  PTA regimen: 2 mg every other day, rotating with 3 mg every other day Last dose pta 3 mg 10/18/20 Kcentra 2000 units x 1 10/19/20 Vitamin K 10 mg IV x 1 10/19/20 INR S/P reversal 1.2  Assessment: 50 y.o. 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 S/p Kcentra + Vit K and colonoscopy. Hgb stable.   Per provider, no further evidence of G.I. bleed.   Pharmacy consult to restart warfarin 8/27.  Plan to continue heparin infusion bridge.  Goal of Therapy:  INR 2.5 - 3.5 Heparin level 0.3-0.7 units/ml Monitor platelets by anticoagulation protocol: Yes   Heparin Status 8/26 2351 HL < 0.1 (0.06 per lab),  subtherapeutic @ 2150 8/27 0903 HL < 0.1, subtherapeutic @ 2450 8/28 0155 HL 0.28, subtherapeutic @ 3050 8/28 0850 HL 0.36, therapeutic x 1 8/28 1609 HL 0.51, therapeutic x 2  8/29 0432 HL 0.51, therapeutic x 3  Warfarin Status   INR Warfarin Dose 8/27  1.2 '5mg'$  8/28 1.4 '3mg'$  8/29 1.7     Plan: Warfarin Patient alternates 2-3 mg at home.   Will give warfarin 3 mg x 1 again today.  watch given recent bleed and heparin bridge, will proceed cautiously.  DDI: amiodarone (on PTA w/warfarin per Med Rec)  Follow up INR/CBC with morning labs.  Heparin  Heparin remains therapeutic  Continue heparin infusion @ 3350 units/hr  Recheck HL with AM labs Continue to monitor H&H and platelets  Chinita Greenland PharmD Clinical Pharmacist 10/24/2020

## 2021-03-16 DIAGNOSIS — Z713 Dietary counseling and surveillance: Secondary | ICD-10-CM | POA: Diagnosis not present

## 2021-03-16 DIAGNOSIS — I10 Essential (primary) hypertension: Secondary | ICD-10-CM | POA: Diagnosis not present

## 2021-03-16 DIAGNOSIS — E119 Type 2 diabetes mellitus without complications: Secondary | ICD-10-CM | POA: Diagnosis not present

## 2021-03-16 DIAGNOSIS — Z01818 Encounter for other preprocedural examination: Secondary | ICD-10-CM | POA: Diagnosis not present

## 2021-03-16 DIAGNOSIS — Z7689 Persons encountering health services in other specified circumstances: Secondary | ICD-10-CM | POA: Diagnosis not present

## 2021-03-16 DIAGNOSIS — Z6841 Body Mass Index (BMI) 40.0 and over, adult: Secondary | ICD-10-CM | POA: Diagnosis not present

## 2021-03-16 DIAGNOSIS — R29898 Other symptoms and signs involving the musculoskeletal system: Secondary | ICD-10-CM | POA: Diagnosis not present

## 2021-04-03 DIAGNOSIS — E119 Type 2 diabetes mellitus without complications: Secondary | ICD-10-CM | POA: Diagnosis not present

## 2021-04-03 DIAGNOSIS — M25569 Pain in unspecified knee: Secondary | ICD-10-CM | POA: Diagnosis not present

## 2021-04-03 DIAGNOSIS — E785 Hyperlipidemia, unspecified: Secondary | ICD-10-CM | POA: Diagnosis not present

## 2021-04-03 DIAGNOSIS — E668 Other obesity: Secondary | ICD-10-CM | POA: Diagnosis not present

## 2021-04-03 DIAGNOSIS — D509 Iron deficiency anemia, unspecified: Secondary | ICD-10-CM | POA: Diagnosis not present

## 2021-04-03 DIAGNOSIS — K439 Ventral hernia without obstruction or gangrene: Secondary | ICD-10-CM | POA: Diagnosis not present

## 2021-04-03 DIAGNOSIS — I10 Essential (primary) hypertension: Secondary | ICD-10-CM | POA: Diagnosis not present

## 2021-04-03 DIAGNOSIS — R69 Illness, unspecified: Secondary | ICD-10-CM | POA: Diagnosis not present

## 2021-04-07 DIAGNOSIS — Z7689 Persons encountering health services in other specified circumstances: Secondary | ICD-10-CM | POA: Diagnosis not present

## 2021-04-07 DIAGNOSIS — Z01818 Encounter for other preprocedural examination: Secondary | ICD-10-CM | POA: Diagnosis not present

## 2021-04-07 DIAGNOSIS — Z6841 Body Mass Index (BMI) 40.0 and over, adult: Secondary | ICD-10-CM | POA: Diagnosis not present

## 2021-04-13 DIAGNOSIS — R69 Illness, unspecified: Secondary | ICD-10-CM | POA: Diagnosis not present

## 2021-04-13 DIAGNOSIS — D508 Other iron deficiency anemias: Secondary | ICD-10-CM | POA: Diagnosis not present

## 2021-04-13 DIAGNOSIS — K2100 Gastro-esophageal reflux disease with esophagitis, without bleeding: Secondary | ICD-10-CM | POA: Diagnosis not present

## 2021-04-13 DIAGNOSIS — I10 Essential (primary) hypertension: Secondary | ICD-10-CM | POA: Diagnosis not present

## 2021-04-13 DIAGNOSIS — Z87891 Personal history of nicotine dependence: Secondary | ICD-10-CM | POA: Diagnosis not present

## 2021-04-13 DIAGNOSIS — Z6841 Body Mass Index (BMI) 40.0 and over, adult: Secondary | ICD-10-CM | POA: Diagnosis not present

## 2021-04-13 DIAGNOSIS — I9789 Other postprocedural complications and disorders of the circulatory system, not elsewhere classified: Secondary | ICD-10-CM | POA: Diagnosis not present

## 2021-04-13 DIAGNOSIS — F419 Anxiety disorder, unspecified: Secondary | ICD-10-CM | POA: Diagnosis not present

## 2021-04-13 DIAGNOSIS — E871 Hypo-osmolality and hyponatremia: Secondary | ICD-10-CM | POA: Diagnosis not present

## 2021-04-13 DIAGNOSIS — Z7901 Long term (current) use of anticoagulants: Secondary | ICD-10-CM | POA: Diagnosis not present

## 2021-04-13 DIAGNOSIS — Z954 Presence of other heart-valve replacement: Secondary | ICD-10-CM | POA: Diagnosis not present

## 2021-04-13 DIAGNOSIS — Z720 Tobacco use: Secondary | ICD-10-CM | POA: Diagnosis not present

## 2021-04-13 DIAGNOSIS — Z7984 Long term (current) use of oral hypoglycemic drugs: Secondary | ICD-10-CM | POA: Diagnosis not present

## 2021-04-13 DIAGNOSIS — I509 Heart failure, unspecified: Secondary | ICD-10-CM | POA: Diagnosis not present

## 2021-04-13 DIAGNOSIS — I5032 Chronic diastolic (congestive) heart failure: Secondary | ICD-10-CM | POA: Diagnosis not present

## 2021-04-13 DIAGNOSIS — E119 Type 2 diabetes mellitus without complications: Secondary | ICD-10-CM | POA: Diagnosis not present

## 2021-04-13 DIAGNOSIS — G4733 Obstructive sleep apnea (adult) (pediatric): Secondary | ICD-10-CM | POA: Diagnosis not present

## 2021-04-25 DIAGNOSIS — Z87891 Personal history of nicotine dependence: Secondary | ICD-10-CM | POA: Diagnosis not present

## 2021-04-25 DIAGNOSIS — Z713 Dietary counseling and surveillance: Secondary | ICD-10-CM | POA: Diagnosis not present

## 2021-07-03 ENCOUNTER — Other Ambulatory Visit: Payer: Self-pay

## 2021-07-03 ENCOUNTER — Emergency Department: Payer: Medicare HMO

## 2021-07-03 ENCOUNTER — Inpatient Hospital Stay
Admission: EM | Admit: 2021-07-03 | Discharge: 2021-07-07 | DRG: 378 | Disposition: A | Discharge status: Home / Self Care | Payer: Medicare HMO | Attending: Internal Medicine | Admitting: Internal Medicine

## 2021-07-03 DIAGNOSIS — Z7984 Long term (current) use of oral hypoglycemic drugs: Secondary | ICD-10-CM

## 2021-07-03 DIAGNOSIS — E785 Hyperlipidemia, unspecified: Secondary | ICD-10-CM | POA: Diagnosis present

## 2021-07-03 DIAGNOSIS — I10 Essential (primary) hypertension: Secondary | ICD-10-CM | POA: Diagnosis present

## 2021-07-03 DIAGNOSIS — Z6841 Body Mass Index (BMI) 40.0 and over, adult: Secondary | ICD-10-CM

## 2021-07-03 DIAGNOSIS — I959 Hypotension, unspecified: Secondary | ICD-10-CM | POA: Diagnosis not present

## 2021-07-03 DIAGNOSIS — R5381 Other malaise: Secondary | ICD-10-CM | POA: Diagnosis not present

## 2021-07-03 DIAGNOSIS — Z87891 Personal history of nicotine dependence: Secondary | ICD-10-CM

## 2021-07-03 DIAGNOSIS — I480 Paroxysmal atrial fibrillation: Secondary | ICD-10-CM | POA: Diagnosis not present

## 2021-07-03 DIAGNOSIS — Z833 Family history of diabetes mellitus: Secondary | ICD-10-CM

## 2021-07-03 DIAGNOSIS — Z8249 Family history of ischemic heart disease and other diseases of the circulatory system: Secondary | ICD-10-CM | POA: Diagnosis not present

## 2021-07-03 DIAGNOSIS — D62 Acute posthemorrhagic anemia: Secondary | ICD-10-CM | POA: Diagnosis not present

## 2021-07-03 DIAGNOSIS — Z952 Presence of prosthetic heart valve: Secondary | ICD-10-CM | POA: Diagnosis not present

## 2021-07-03 DIAGNOSIS — K922 Gastrointestinal hemorrhage, unspecified: Principal | ICD-10-CM | POA: Diagnosis present

## 2021-07-03 DIAGNOSIS — Z7901 Long term (current) use of anticoagulants: Secondary | ICD-10-CM

## 2021-07-03 DIAGNOSIS — F32A Depression, unspecified: Secondary | ICD-10-CM | POA: Diagnosis present

## 2021-07-03 DIAGNOSIS — Z5320 Procedure and treatment not carried out because of patient's decision for unspecified reasons: Secondary | ICD-10-CM | POA: Diagnosis not present

## 2021-07-03 DIAGNOSIS — E119 Type 2 diabetes mellitus without complications: Secondary | ICD-10-CM

## 2021-07-03 DIAGNOSIS — Z79899 Other long term (current) drug therapy: Secondary | ICD-10-CM

## 2021-07-03 DIAGNOSIS — K5900 Constipation, unspecified: Secondary | ICD-10-CM | POA: Diagnosis not present

## 2021-07-03 DIAGNOSIS — R110 Nausea: Secondary | ICD-10-CM | POA: Diagnosis not present

## 2021-07-03 DIAGNOSIS — E871 Hypo-osmolality and hyponatremia: Secondary | ICD-10-CM | POA: Diagnosis present

## 2021-07-03 DIAGNOSIS — R197 Diarrhea, unspecified: Secondary | ICD-10-CM | POA: Diagnosis not present

## 2021-07-03 DIAGNOSIS — Z743 Need for continuous supervision: Secondary | ICD-10-CM | POA: Diagnosis not present

## 2021-07-03 DIAGNOSIS — K921 Melena: Secondary | ICD-10-CM | POA: Diagnosis not present

## 2021-07-03 DIAGNOSIS — I4892 Unspecified atrial flutter: Secondary | ICD-10-CM | POA: Diagnosis not present

## 2021-07-03 DIAGNOSIS — F419 Anxiety disorder, unspecified: Secondary | ICD-10-CM | POA: Diagnosis present

## 2021-07-03 DIAGNOSIS — K5791 Diverticulosis of intestine, part unspecified, without perforation or abscess with bleeding: Secondary | ICD-10-CM | POA: Diagnosis not present

## 2021-07-03 DIAGNOSIS — K625 Hemorrhage of anus and rectum: Secondary | ICD-10-CM | POA: Diagnosis not present

## 2021-07-03 DIAGNOSIS — E66813 Obesity, class 3: Secondary | ICD-10-CM | POA: Diagnosis present

## 2021-07-03 LAB — COMPREHENSIVE METABOLIC PANEL
ALT: 25 U/L (ref 0–44)
AST: 23 U/L (ref 15–41)
Albumin: 4 g/dL (ref 3.5–5.0)
Alkaline Phosphatase: 67 U/L (ref 38–126)
Anion gap: 5 (ref 5–15)
BUN: 30 mg/dL — ABNORMAL HIGH (ref 6–20)
CO2: 27 mmol/L (ref 22–32)
Calcium: 10.4 mg/dL — ABNORMAL HIGH (ref 8.9–10.3)
Chloride: 101 mmol/L (ref 98–111)
Creatinine, Ser: 0.85 mg/dL (ref 0.61–1.24)
GFR, Estimated: >60 mL/min (ref >60)
Glucose, Bld: 119 mg/dL — ABNORMAL HIGH (ref 70–99)
Potassium: 4.7 mmol/L (ref 3.5–5.1)
Sodium: 133 mmol/L — ABNORMAL LOW (ref 135–145)
Total Bilirubin: 1 mg/dL (ref 0.3–1.2)
Total Protein: 7 g/dL (ref 6.5–8.1)

## 2021-07-03 LAB — CBC
HCT: 40.3 % (ref 39.0–52.0)
Hemoglobin: 12.3 g/dL — ABNORMAL LOW (ref 13.0–17.0)
MCH: 27.9 pg (ref 26.0–34.0)
MCHC: 30.5 g/dL (ref 30.0–36.0)
MCV: 91.4 fL (ref 80.0–100.0)
Platelets: 205 10*3/uL (ref 150–400)
RBC: 4.41 MIL/uL (ref 4.22–5.81)
RDW: 16.1 % — ABNORMAL HIGH (ref 11.5–15.5)
WBC: 8.5 10*3/uL (ref 4.0–10.5)
nRBC: 0 % (ref 0.0–0.2)

## 2021-07-03 LAB — PROTIME-INR
INR: 2.6 — ABNORMAL HIGH (ref 0.8–1.2)
INR: 1.4 — ABNORMAL HIGH (ref 0.8–1.2)
Prothrombin Time: 27.5 seconds — ABNORMAL HIGH (ref 11.4–15.2)
Prothrombin Time: 17.3 seconds — ABNORMAL HIGH (ref 11.4–15.2)

## 2021-07-03 LAB — APTT: aPTT: 38 seconds — ABNORMAL HIGH (ref 24–36)

## 2021-07-03 LAB — HEMOGLOBIN AND HEMATOCRIT, BLOOD
HCT: 32.8 % — ABNORMAL LOW (ref 39.0–52.0)
HCT: 33.2 % — ABNORMAL LOW (ref 39.0–52.0)
Hemoglobin: 10 g/dL — ABNORMAL LOW (ref 13.0–17.0)
Hemoglobin: 10.2 g/dL — ABNORMAL LOW (ref 13.0–17.0)

## 2021-07-03 MED ORDER — ACETAMINOPHEN 650 MG RE SUPP: 650.0000 mg | Freq: Four times a day (QID) | RECTAL | Status: DC | PRN

## 2021-07-03 MED ORDER — SODIUM CHLORIDE 0.9 % IV SOLN
10.0000 mL/h | Freq: Once | INTRAVENOUS | Status: DC
Start: 2021-07-03 — End: 2021-07-07

## 2021-07-03 MED ORDER — PROTHROMBIN COMPLEX CONC HUMAN 500 UNITS IV KIT
2171.0000 [IU] | PACK | Status: AC
  Administered 2021-07-03: 2171 [IU] via INTRAVENOUS
  Filled 2021-07-03: qty 2171

## 2021-07-03 MED ORDER — PANTOPRAZOLE SODIUM 40 MG IV SOLR: 40.0000 mg | Freq: Two times a day (BID) | INTRAVENOUS | Status: DC

## 2021-07-03 MED ORDER — VITAMIN K1 10 MG/ML IJ SOLN
10.0000 mg | INTRAVENOUS | Status: AC
  Administered 2021-07-03: 10 mg via INTRAVENOUS
  Filled 2021-07-03: qty 1

## 2021-07-03 MED ORDER — ONDANSETRON HCL 4 MG/2ML IJ SOLN
4.0000 mg | Freq: Once | INTRAMUSCULAR | Status: AC
Start: 2021-07-03 — End: 2021-07-03
  Administered 2021-07-03: 4 mg via INTRAVENOUS
  Filled 2021-07-03: qty 2

## 2021-07-03 MED ORDER — IOHEXOL 350 MG/ML SOLN: 150.0000 mL | Freq: Once | INTRAVENOUS | Status: DC | PRN

## 2021-07-03 MED ORDER — PANTOPRAZOLE 80MG IVPB - SIMPLE MED
80.0000 mg | Freq: Once | INTRAVENOUS | Status: AC
  Administered 2021-07-03: 80 mg via INTRAVENOUS
  Filled 2021-07-03: qty 100

## 2021-07-03 MED ORDER — LORAZEPAM 1 MG PO TABS
1.0000 mg | ORAL_TABLET | Freq: Two times a day (BID) | ORAL | Status: DC
  Administered 2021-07-03 – 2021-07-07 (×8): 1 mg via ORAL
  Filled 2021-07-03 (×8): qty 1

## 2021-07-03 MED ORDER — AMIODARONE HCL 200 MG PO TABS
200.0000 mg | ORAL_TABLET | Freq: Two times a day (BID) | ORAL | Status: DC
Start: 2021-07-03 — End: 2021-07-07
  Administered 2021-07-03 – 2021-07-07 (×8): 200 mg via ORAL
  Filled 2021-07-03 (×8): qty 1

## 2021-07-03 MED ORDER — ONDANSETRON HCL 4 MG/2ML IJ SOLN
4.0000 mg | Freq: Four times a day (QID) | INTRAMUSCULAR | Status: DC | PRN
Start: 2021-07-03 — End: 2021-07-07
  Administered 2021-07-04 – 2021-07-06 (×5): 4 mg via INTRAVENOUS
  Filled 2021-07-03 (×5): qty 2

## 2021-07-03 MED ORDER — SODIUM CHLORIDE 0.9 % IV SOLN: INTRAVENOUS | Status: DC

## 2021-07-03 MED ORDER — TRAZODONE HCL 50 MG PO TABS
25.0000 mg | ORAL_TABLET | Freq: Every evening | ORAL | Status: DC | PRN
  Filled 2021-07-03: qty 1

## 2021-07-03 MED ORDER — PIOGLITAZONE HCL 30 MG PO TABS
45.0000 mg | ORAL_TABLET | Freq: Every day | ORAL | Status: DC
  Administered 2021-07-04 – 2021-07-07 (×4): 45 mg via ORAL
  Filled 2021-07-03 (×4): qty 1

## 2021-07-03 MED ORDER — CYCLOBENZAPRINE HCL 10 MG PO TABS
5.0000 mg | ORAL_TABLET | Freq: Three times a day (TID) | ORAL | Status: DC | PRN
  Administered 2021-07-07: 5 mg via ORAL
  Filled 2021-07-03: qty 1

## 2021-07-03 MED ORDER — MORPHINE SULFATE (PF) 4 MG/ML IV SOLN
4.0000 mg | Freq: Once | INTRAVENOUS | Status: AC
  Administered 2021-07-03: 4 mg via INTRAVENOUS
  Filled 2021-07-03: qty 1

## 2021-07-03 MED ORDER — FERROUS SULFATE 325 (65 FE) MG PO TABS
325.0000 mg | ORAL_TABLET | Freq: Every day | ORAL | Status: DC
  Administered 2021-07-04 – 2021-07-06 (×3): 325 mg via ORAL
  Filled 2021-07-03 (×3): qty 1

## 2021-07-03 MED ORDER — ONDANSETRON HCL 4 MG PO TABS
4.0000 mg | ORAL_TABLET | Freq: Four times a day (QID) | ORAL | Status: DC | PRN
  Administered 2021-07-05: 4 mg via ORAL
  Filled 2021-07-03: qty 1

## 2021-07-03 MED ORDER — SODIUM CHLORIDE 0.9 % IV SOLN: Freq: Once | INTRAVENOUS | Status: AC

## 2021-07-03 MED ORDER — TRAMADOL-ACETAMINOPHEN 37.5-325 MG PO TABS: 2.0000 | ORAL_TABLET | Freq: Two times a day (BID) | ORAL | Status: DC | PRN

## 2021-07-03 MED ORDER — METOPROLOL SUCCINATE ER 50 MG PO TB24
50.0000 mg | ORAL_TABLET | Freq: Every day | ORAL | Status: DC
  Administered 2021-07-05 – 2021-07-07 (×3): 50 mg via ORAL
  Filled 2021-07-03 (×3): qty 1

## 2021-07-03 MED ORDER — VITAMIN B-12 1000 MCG PO TABS
1000.0000 ug | ORAL_TABLET | Freq: Every day | ORAL | Status: DC
  Administered 2021-07-04 – 2021-07-07 (×4): 1000 ug via ORAL
  Filled 2021-07-03 (×4): qty 1

## 2021-07-03 MED ORDER — MORPHINE SULFATE (PF) 2 MG/ML IV SOLN
2.0000 mg | INTRAVENOUS | Status: DC | PRN
  Administered 2021-07-04: 2 mg via INTRAVENOUS
  Filled 2021-07-03 (×2): qty 1

## 2021-07-03 MED ORDER — PANTOPRAZOLE INFUSION (NEW) - SIMPLE MED
8.0000 mg/h | INTRAVENOUS | Status: DC
  Administered 2021-07-03 – 2021-07-04 (×2): 8 mg/h via INTRAVENOUS
  Filled 2021-07-03 (×2): qty 100

## 2021-07-03 MED ORDER — ACETAMINOPHEN 325 MG PO TABS
650.0000 mg | ORAL_TABLET | Freq: Four times a day (QID) | ORAL | Status: DC | PRN
  Administered 2021-07-04 – 2021-07-07 (×4): 650 mg via ORAL
  Filled 2021-07-03 (×4): qty 2

## 2021-07-03 NOTE — Assessment & Plan Note (Addendum)
Currently blood pressure on softer side. ?-Keep holding home lisinopril ?-Continuing home metoprolol ?

## 2021-07-03 NOTE — Assessment & Plan Note (Addendum)
Hemoglobin borderline soft, received IV fluid with positive response.  Most likely secondary to GI bleed. ?-Continue with IV fluid. ?-Might need blood transfusions-currently wants to hold. ?-Continue holding home lisinopril ?

## 2021-07-03 NOTE — Assessment & Plan Note (Addendum)
Home Actos was continued on admission. ?-Start him on every 4 hourly moderate SSI, can be switched to with meals once able to take p.o. ?-Continue to monitor ?

## 2021-07-03 NOTE — ED Notes (Signed)
Pt repeatedly requesting to ambulate to bathroom. ?

## 2021-07-03 NOTE — Assessment & Plan Note (Addendum)
This could be combined upper and lower GI bleeding given bright red bleeding per rectum and melena.,  Hemoglobin at 9 this morning. ?- His Coumadin was reversed with IV vitamin K and he was given Kcentra, repeat INR at 1.4 ?-He was given IV Protonix bolus.  We will continue on IV Protonix drip. ?- She was typed and crossmatched and is currently declining PRBCs transfusion. ?- We will revisit transfusion if he bleeds more or his hemoglobin dropped further. ?-NM tagged RBC scan ordered.-Patient was unable to fit in our scanner for CT abdomen ?-GI is on board-will appreciate their recommendations. ?-Continue to monitor hemoglobin ?-Transfuse if below 8 ?

## 2021-07-03 NOTE — ED Notes (Addendum)
Pt refusing blood at this time. ?

## 2021-07-03 NOTE — ED Triage Notes (Signed)
Pt comes into the ED via ems from home with c/o dark colored stool, states he has been having GI issues since august of last year. C/o SOB, 99%RA ? ?142/86 ?TS17 ?BL39 ?

## 2021-07-03 NOTE — ED Notes (Signed)
Pt had medium BM of bright red blood  ? ?

## 2021-07-03 NOTE — ED Provider Notes (Signed)
? ?[facility] ?Provider Note ? ? ? None  ?  (approximate) ? ? ?History  ? ?GI Bleeding ? ? ?HPI ? ?Russell Ray is a 51 y.o. male  who, per discharge summary dated 10/24/2020 had admission for rectal bleed thought "diverticular in the setting of constipation and pt being on warfarin" who presents to the emergency department today because of concern for gi bleed. The patient states that he first noticed some bleeding in his stool at around 5 am this morning. Since then has had multiple episodes of bloody bowel movements. States that he has noticed both darker and bright red blood. It has been accompanied by some discomfort in his lower abdomen. The patient is on warfarin and says his INR has been running between 2.5-3.5 which is his goal.  ? ?Physical Exam  ? ?Triage Vital Signs: ?ED Triage Vitals  ?Enc Vitals Group  ?   BP 07/03/21 1333 (!) 108/51  ?   Pulse Rate 07/03/21 1333 80  ?   Resp 07/03/21 1333 20  ?   Temp 07/03/21 1335 97.7 ?F (36.5 ?C)  ?   Temp Source 07/03/21 1333 Oral  ?   SpO2 07/03/21 1333 100 %  ?   Weight 07/03/21 1335 (!) 530 lb (240.4 kg)  ?   Height 07/03/21 1335 '5\' 10"'$  (1.778 m)  ? ?Most recent vital signs: ?Vitals:  ? 07/03/21 1333 07/03/21 1335  ?BP: (!) 108/51   ?Pulse: 80   ?Resp: 20   ?Temp:  97.7 ?F (36.5 ?C)  ?SpO2: 100%   ? ? ?General: Awake, no distress.  ?CV:  Good peripheral perfusion.  ?Resp:  Normal effort.  ?Abd:  No distention. Tender to palpation in the lower abdomen.  ? ? ?ED Results / Procedures / Treatments  ? ?Labs ?(all labs ordered are listed, but only abnormal results are displayed) ?Labs Reviewed  ?COMPREHENSIVE METABOLIC PANEL - Abnormal; Notable for the following components:  ?    Result Value  ? Sodium 133 (*)   ? Glucose, Bld 119 (*)   ? BUN 30 (*)   ? Calcium 10.4 (*)   ? All other components within normal limits  ?CBC - Abnormal; Notable for the following components:  ? Hemoglobin 12.3 (*)   ? RDW 16.1 (*)   ? All other components within normal  limits  ?PROTIME-INR - Abnormal; Notable for the following components:  ? Prothrombin Time 27.5 (*)   ? INR 2.6 (*)   ? All other components within normal limits  ?APTT - Abnormal; Notable for the following components:  ? aPTT 38 (*)   ? All other components within normal limits  ?HEMOGLOBIN AND HEMATOCRIT, BLOOD - Abnormal; Notable for the following components:  ? Hemoglobin 10.0 (*)   ? HCT 32.8 (*)   ? All other components within normal limits  ?PROTIME-INR  ?PROTIME-INR  ?BASIC METABOLIC PANEL  ?CBC  ?HEMOGLOBIN AND HEMATOCRIT, BLOOD  ?HEMOGLOBIN AND HEMATOCRIT, BLOOD  ?POC OCCULT BLOOD, ED  ?TYPE AND SCREEN  ?PREPARE RBC (CROSSMATCH)  ? ? ? ?EKG ? ?None ? ? ?RADIOLOGY ?None ? ? ?PROCEDURES: ? ?Critical Care performed: Yes, see critical care procedure note(s) ? ?Procedures ? ?CRITICAL CARE ?Performed by: Nance Pear ? ? ?Total critical care time: 35 minutes ? ?Critical care time was exclusive of separately billable procedures and treating other patients. ? ?Critical care was necessary to treat or prevent imminent or life-threatening deterioration. ? ?Critical care was time spent personally by me  on the following activities: development of treatment plan with patient and/or surrogate as well as nursing, discussions with consultants, evaluation of patient's response to treatment, examination of patient, obtaining history from patient or surrogate, ordering and performing treatments and interventions, ordering and review of laboratory studies, ordering and review of radiographic studies, pulse oximetry and re-evaluation of patient's condition. ? ? ?MEDICATIONS ORDERED IN ED: ?Medications - No data to display ? ? ?IMPRESSION / MDM / ASSESSMENT AND PLAN / ED COURSE  ?I reviewed the triage vital signs and the nursing notes. ?             ?               ? ?Differential diagnosis includes, but is not limited to, GI bleed, diverticulitis, hemorrhoid. ? ?Patient presents to the emergency department today because of  concerns for GI bleed.  Initial blood work showed hemoglobin of 12.3.  Patient initially without significant hypotension or tachycardia.  Patient is on warfarin secondary to aortic valve repair.  Initially given reassuring blood work and vital signs I did not want to immediately reverse his warfarin.  However while the patient was here in the emergency department he started to become hypotensive.  At this time I did order reversal for his warfarin.  Additionally did order an emergent blood.  Patient did initially not want blood transfusion.  He wanted to see how he did on the Kcentra and fluids.  His blood pressure did improve after that.  I discussed with Dr. Gayla Medicus with the hospitalist service for admission. ? ? ?FINAL CLINICAL IMPRESSION(S) / ED DIAGNOSES  ? ?Final diagnoses:  ?Gastrointestinal hemorrhage, unspecified gastrointestinal hemorrhage type  ? ? ?Note:  This document was prepared using Dragon voice recognition software and may include unintentional dictation errors. ? ?  ?Nance Pear, MD ?07/03/21 2320 ? ?

## 2021-07-03 NOTE — Assessment & Plan Note (Addendum)
Continue statin therapy.

## 2021-07-03 NOTE — Assessment & Plan Note (Addendum)
-   He has a prosthetic valve. ?- INR is currently 2.6>>1.4 ?- Coumadin is being held due to GI bleed ?- We will defer to our gastroenterologist when to  consider any further anticoagulation. ?

## 2021-07-03 NOTE — Assessment & Plan Note (Addendum)
EKG shows atrial flutter with variable conduction. ?-Continue with home amiodarone and metoprolol. ?-Holding Coumadin for GI bleed. ?

## 2021-07-03 NOTE — H&P (Signed)
?  ?  ?[place] ? ? ?PATIENT NAME: Russell Ray   ? ?MR#:  [id number] ? ?DATE OF BIRTH:  [birth year] ? ?DATE OF ADMISSION:  07/03/2021 ? ?PRIMARY CARE PHYSICIAN: [name], MD  ? ?Patient is coming from: Home ? ?REQUESTING/REFERRING PHYSICIAN: Nance Pear MD ? ?CHIEF COMPLAINT:  ? ?Chief Complaint  ?Patient presents with  ? GI Bleeding  ? ? ?HISTORY OF PRESENT ILLNESS:  ?Russell Ray is a 51 y.o. 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  She was noted to have dark stools as well.  He had 1 episode of bright red bleeding per rectum here in the waiting room.  He admits to lower abdominal cramps.  He denied any fever or chills.  He had nausea without vomiting or heartburn.  No dysuria, oliguria or hematuria or flank pain.  No other bleeding diathesis.  Is currently willing to have PRBCs transfusion if his hemoglobin drops more. ?ED Course: When he came to the ER BP was 108 251 and dropped to 85/48 and later to 67/32 with otherwise normal vital signs initially then later heart rate was up to 120.  With IV hydration BP was up to 125/61 then 110/55 with a heart rate of 94 and later 79.   Labs revealed mild hyponatremia 133 with a BUN of 30 with a calcium of 10.4 with otherwise unremarkable CMP.  CBC showed a globin of 12.3 and hematocrit of 40.3 with repeat levels of 10/32.8 ?Blood group was A+ with negative green.  ? ?EKG as reviewed by me : Pending. ? ?The patient was given 10 mg of IV vitamin K as well as Kcentra, 1 L bolus of IV normal saline followed by 150 mill per hour then was given IV Protonix bolus and drip.  He will be admitted to a progressive unit bed for further evaluation and management. ?PAST MEDICAL HISTORY:   ? ?Past Medical History:  ?Diagnosis Date  ? Anxiety   ? Aortic valve stenosis   ? Atrial fibrillation ([place])   ? CHF (congestive heart failure) ([place])   ? Depression   ? Diabetes mellitus without complication ([place])   ? Hypertension   ? ? ?PAST SURGICAL HISTORY:  ? ?Past Surgical History:  ?Procedure Laterality Date  ? APPLICATION OF WOUND VAC    ? CARDIAC CATHETERIZATION Right 04/22/2015  ? Procedure: Right/Left Heart Cath and Coronary Angiography;  Surgeon: Dionisio David, MD;  Location: [place] [facility];  Service: Cardiovascular;  Laterality: Right;  ? CARDIAC VALVE REPLACEMENT    ? CARDIOVERSION N/A 05/14/2017  ? Procedure: CARDIOVERSION;  Surgeon: Dionisio David, MD;  Location: [facility] ORS;  Service: Cardiovascular;  Laterality: N/A;  ? COLONOSCOPY N/A 10/21/2020  ? Procedure: COLONOSCOPY;  Surgeon: Toledo, Benay Pike, MD;  Location: [facility] ENDOSCOPY;  Service: Gastroenterology;  Laterality: N/A;  ? COLONOSCOPY WITH PROPOFOL N/A 10/05/2016  ? Procedure: COLONOSCOPY WITH PROPOFOL;  Surgeon: Manya Silvas, MD;  Location: [facility] ENDOSCOPY;  Service: Endoscopy;  Laterality: N/A;  ? debridement sternum    ? ELECTROPHYSIOLOGIC STUDY N/A 05/05/2015  ? Procedure: Cardioversion;  Surgeon: Dionisio David, MD;  Location: [facility] ORS;  Service: Cardiovascular;  Laterality: N/A;  ? ELECTROPHYSIOLOGIC STUDY N/A 05/17/2015  ? Procedure: CARDIOVERSION;  Surgeon: Dionisio David, MD;  Location: [facility] ORS;  Service: Cardiovascular;  Laterality: N/A;  ? ESOPHAGOGASTRODUODENOSCOPY (EGD) WITH PROPOFOL N/A 10/05/2016  ? Procedure: ESOPHAGOGASTRODUODENOSCOPY (EGD) WITH PROPOFOL;  Surgeon: Manya Silvas, MD;  Location: [facility] ENDOSCOPY;  Service: Endoscopy;  Laterality: N/A;  ? GREATER OMENTAL FLAP CLOSURE    ? HERNIA REPAIR    ? X3  ? VALVE REPLACEMENT    ? VENTRAL HERNIA REPAIR N/A 12/01/2016  ? Procedure: HERNIA REPAIR VENTRAL ADULT with mesh;  Surgeon: Jules Husbands, MD;  Location: [facility] ORS;  Service: General;  Laterality: N/A;  ? ? ?SOCIAL  HISTORY:  ? ?Social History  ? ?Tobacco Use  ? Smoking status: Former  ?  Packs/day: 2.00  ?  Years: 28.00  ?  Pack years: 56.00  ?  Types: Cigarettes  ? Smokeless tobacco: Never  ?Substance Use Topics  ? Alcohol use: No  ? ? ?FAMILY HISTORY:  ? ?Family History  ?Problem Relation Age of Onset  ? Hypertension Mother   ? Hypertension Maternal Grandmother   ? Diabetes Maternal Grandmother   ? ? ?DRUG ALLERGIES:  ?No Known Allergies ? ?REVIEW OF SYSTEMS:  ? ?ROS ?As per history of present illness. All pertinent systems were reviewed above. Constitutional, HEENT, cardiovascular, respiratory, GI, GU, musculoskeletal, neuro, psychiatric, endocrine, integumentary and hematologic systems were reviewed and are otherwise negative/unremarkable except for positive findings mentioned above in the HPI. ? ? ?MEDICATIONS AT HOME:  ? ?Prior to Admission medications   ?Medication Sig Start Date End Date Taking? Authorizing Provider  ?acetaminophen (TYLENOL) 325 MG tablet Take 650 mg by mouth every 4 (four) hours as needed for moderate pain.    [provider]  ?amiodarone (PACERONE) 200 MG tablet 2 tablets twice a day for 5 days then 1 tablet twice a day for 5 days then one tablet daily afterwards ?Patient taking differently: Take 200 mg by mouth 2 (two) times daily. 05/15/17   Loletha Grayer, MD  ?cyanocobalamin 1000 MCG tablet Take 1,000 mcg by mouth daily.    [provider]  ?cyclobenzaprine (FLEXERIL) 5 MG tablet Take 5 mg by mouth 3 (three) times daily as needed for muscle spasms.     [provider]  ?docusate sodium (COLACE) 100 MG capsule Take 1 capsule (100 mg total) by mouth 2 (two) times daily. 10/24/20   Fritzi Mandes, MD  ?Iron, Ferrous Sulfate, 142 (45 Fe) MG TBCR Take 1 tablet by mouth daily.    [provider]  ?lisinopril (PRINIVIL,ZESTRIL) 20 MG tablet Take 20 mg by mouth daily.    [provider]  ?LORazepam (ATIVAN) 1 MG tablet Take 1 mg by mouth 2 (two) times daily.      [provider]  ?Magnesium Oxide 400 (240 Mg) MG TABS Take 1 tablet (400 mg total) by mouth every morning. ?Patient taking differently: Take 800 mg by mouth 2 (two) times daily. 05/16/17   Loletha Grayer, MD  ?metoprolol succinate (TOPROL-XL) 50 MG 24 hr tablet Take 50 mg by mouth daily. Take with or immediately following a meal.    [provider]  ?ondansetron (ZOFRAN) 4 MG tablet Take 4 mg by mouth 2 (two) times daily.    [provider]  ?pantoprazole (PROTONIX) 40 MG tablet Take 1 tablet (40 mg total) by mouth daily. 10/25/20   Fritzi Mandes, MD  ?pioglitazone (ACTOS) 45 MG tablet Take 45 mg by mouth daily.    [provider]  ?polyethylene glycol (MIRALAX / GLYCOLAX) 17  g packet Take 17 g by mouth daily. 10/25/20   Fritzi Mandes, MD  ?traMADol-acetaminophen (ULTRACET) 37.5-325 MG tablet Take 2 tablets by mouth 2 (two) times daily as needed for moderate pain.    [provider]  ?warfarin (COUMADIN) 2 MG tablet Take 2 mg by mouth every other day.    [provider]  ?warfarin (COUMADIN) 3 MG tablet Take 3 mg by mouth every other day.    [provider]  ? ?  ? ?VITAL SIGNS:  ?Blood pressure (!) 110/55, pulse 85, temperature 97.7 ?F (36.5 ?C), temperature source Oral, resp. rate 20, height '5\' 10"'$  (1.778 m), weight (!) 240.4 kg, SpO2 99 %. ? ?PHYSICAL EXAMINATION:  ?Physical Exam ? ?GENERAL:  51 y.o.-year-old Caucasian male patient lying in the bed with no acute distress.  ?EYES: Pupils equal, round, reactive to light and accommodation. No scleral icterus. Extraocular muscles intact.  ?HEENT: Head atraumatic, normocephalic. Oropharynx and nasopharynx clear.  ?NECK:  Supple, no jugular venous distention. No thyroid enlargement, no tenderness.  ?LUNGS: Normal breath sounds bilaterally, no wheezing, rales,rhonchi or crepitation. No use of accessory muscles of respiration.  ?CARDIOVASCULAR: Regular rate and rhythm, S1, S2 normal. No murmurs, rubs, or gallops.   ?ABDOMEN: Soft, nondistended, nontender. Bowel sounds present. No organomegaly or mass.  ?EXTREMITIES: No pedal edema, cyanosis, or clubbing.  ?NEUROLOGIC: Cranial nerves II through XII are intact. Mus

## 2021-07-04 ENCOUNTER — Inpatient Hospital Stay: Payer: Medicare HMO

## 2021-07-04 DIAGNOSIS — E785 Hyperlipidemia, unspecified: Secondary | ICD-10-CM | POA: Diagnosis not present

## 2021-07-04 DIAGNOSIS — K922 Gastrointestinal hemorrhage, unspecified: Secondary | ICD-10-CM | POA: Diagnosis not present

## 2021-07-04 DIAGNOSIS — I959 Hypotension, unspecified: Secondary | ICD-10-CM | POA: Diagnosis not present

## 2021-07-04 DIAGNOSIS — I10 Essential (primary) hypertension: Secondary | ICD-10-CM | POA: Diagnosis not present

## 2021-07-04 LAB — BASIC METABOLIC PANEL
Anion gap: 5 (ref 5–15)
BUN: 37 mg/dL — ABNORMAL HIGH (ref 6–20)
CO2: 25 mmol/L (ref 22–32)
Calcium: 9.2 mg/dL (ref 8.9–10.3)
Chloride: 105 mmol/L (ref 98–111)
Creatinine, Ser: 0.89 mg/dL (ref 0.61–1.24)
GFR, Estimated: >60 mL/min (ref >60)
Glucose, Bld: 117 mg/dL — ABNORMAL HIGH (ref 70–99)
Potassium: 4.4 mmol/L (ref 3.5–5.1)
Sodium: 135 mmol/L (ref 135–145)

## 2021-07-04 LAB — CBC
HCT: 29.2 % — ABNORMAL LOW (ref 39.0–52.0)
Hemoglobin: 9 g/dL — ABNORMAL LOW (ref 13.0–17.0)
MCH: 28 pg (ref 26.0–34.0)
MCHC: 30.8 g/dL (ref 30.0–36.0)
MCV: 91 fL (ref 80.0–100.0)
Platelets: 151 10*3/uL (ref 150–400)
RBC: 3.21 MIL/uL — ABNORMAL LOW (ref 4.22–5.81)
RDW: 16.6 % — ABNORMAL HIGH (ref 11.5–15.5)
WBC: 8.9 10*3/uL (ref 4.0–10.5)
nRBC: 0 % (ref 0.0–0.2)

## 2021-07-04 LAB — HEMOGLOBIN A1C
Hgb A1c MFr Bld: 6.2 % — ABNORMAL HIGH (ref 4.8–5.6)
Mean Plasma Glucose: 131.24 mg/dL

## 2021-07-04 LAB — HEMOGLOBIN AND HEMATOCRIT, BLOOD
HCT: 28.7 % — ABNORMAL LOW (ref 39.0–52.0)
Hemoglobin: 8.9 g/dL — ABNORMAL LOW (ref 13.0–17.0)

## 2021-07-04 LAB — PROTIME-INR
INR: 1.4 — ABNORMAL HIGH (ref 0.8–1.2)
Prothrombin Time: 16.8 seconds — ABNORMAL HIGH (ref 11.4–15.2)

## 2021-07-04 LAB — CBG MONITORING, ED: Glucose-Capillary: 111 mg/dL — ABNORMAL HIGH (ref 70–99)

## 2021-07-04 MED ORDER — INSULIN ASPART 100 UNIT/ML IJ SOLN
0.0000 [IU] | INTRAMUSCULAR | Status: DC
  Filled 2021-07-04: qty 1

## 2021-07-04 MED ORDER — TECHNETIUM TC 99M-LABELED RED BLOOD CELLS IV KIT
26.7000 | PACK | Freq: Once | INTRAVENOUS | Status: AC
  Administered 2021-07-04: 26.7 via INTRAVENOUS

## 2021-07-04 NOTE — ED Notes (Signed)
Pt transported to Nuclear Medicine  

## 2021-07-04 NOTE — Consult Note (Signed)
Consultation ? ?Referring Provider:     Hospitalist ?Admit date: 07/03/2021 ?Consult date: 07/03/2021      ?Reason for Consultation:     GI Bleeding ?       ? HPI:   ?Russell Ray is a 51 y.o. gentleman with morbid obesity, mechanical aortic valve, a. Fib, hypertension, and DM II who is here with suspected recurrent diverticular bleeding. Had episode in August of 2022 with colonoscopy just showing sigmoid diverticulosis. Patient presents with similar symptoms as last time. He is too overweight to undergo CT imaging but tagged rbc scan showed slow bleed in sigmoid colon. Patient's last significant episode of hematochezia was yesterday. He had small bowel movement this morning and notes it appears to be "clearing up". He has constipation but does not take NSAIDS. No hemodynamic instability. ? ?Past Medical History:  ?Diagnosis Date  ? Anxiety   ? Aortic valve stenosis   ? Atrial fibrillation ([place])   ? CHF (congestive heart failure) ([place])   ? Depression   ? Diabetes mellitus without complication ([place])   ? Hypertension   ? ? ?Past Surgical History:  ?Procedure Laterality Date  ? APPLICATION OF WOUND VAC    ? CARDIAC CATHETERIZATION Right 04/22/2015  ? Procedure: Right/Left Heart Cath and Coronary Angiography;  Surgeon: Dionisio David, MD;  Location: [place] [facility];  Service: Cardiovascular;  Laterality: Right;  ? CARDIAC VALVE REPLACEMENT    ? CARDIOVERSION N/A 05/14/2017  ? Procedure: CARDIOVERSION;  Surgeon: Dionisio David, MD;  Location: [facility] ORS;  Service: Cardiovascular;  Laterality: N/A;  ? COLONOSCOPY N/A 10/21/2020  ? Procedure: COLONOSCOPY;  Surgeon: Toledo, Benay Pike, MD;  Location: [facility] ENDOSCOPY;  Service: Gastroenterology;  Laterality: N/A;  ? COLONOSCOPY WITH PROPOFOL N/A 10/05/2016  ? Procedure: COLONOSCOPY WITH PROPOFOL;  Surgeon: Manya Silvas, MD;  Location[facility] ENDOSCOPY;  Service: Endoscopy;  Laterality: N/A;  ? debridement sternum    ? ELECTROPHYSIOLOGIC STUDY N/A 05/05/2015  ? Procedure:  Cardioversion;  Surgeon: Dionisio David, MD;  Location: [facility] ORS;  Service: Cardiovascular;  Laterality: N/A;  ? ELECTROPHYSIOLOGIC STUDY N/A 05/17/2015  ? Procedure: CARDIOVERSION;  Surgeon: Dionisio David, MD;  Location: [facility] ORS;  Service: Cardiovascular;  Laterality: N/A;  ? ESOPHAGOGASTRODUODENOSCOPY (EGD) WITH PROPOFOL N/A 10/05/2016  ? Procedure: ESOPHAGOGASTRODUODENOSCOPY (EGD) WITH PROPOFOL;  Surgeon: Manya Silvas, MD;  Location: [facility] ENDOSCOPY;  Service: Endoscopy;  Laterality: N/A;  ? GREATER OMENTAL FLAP CLOSURE    ? HERNIA REPAIR    ? X3  ? VALVE REPLACEMENT    ? VENTRAL HERNIA REPAIR N/A 12/01/2016  ? Procedure: HERNIA REPAIR VENTRAL ADULT with mesh;  Surgeon: Jules Husbands, MD;  Location: [facility] ORS;  Service: General;  Laterality: N/A;  ? ? ?Family History  ?Problem Relation Age of Onset  ? Hypertension Mother   ? Hypertension Maternal Grandmother   ? Diabetes Maternal Grandmother   ?  ? ?Social History  ? ?Tobacco Use  ? Smoking status: Former  ?  Packs/day: 2.00  ?  Years: 28.00  ?  Pack years: 56.00  ?  Types: Cigarettes  ? Smokeless tobacco: Never  ?Vaping Use  ? Vaping Use: Never used  ?Substance Use Topics  ? Alcohol use: No  ? Drug use: No  ? ? ?Prior to Admission medications   ?Medication Sig Start Date End Date Taking? Authorizing Provider  ?acetaminophen (TYLENOL) 325 MG tablet Take 650 mg by mouth every 4 (four) hours as needed for moderate pain.  Yes [provider]  ?amiodarone (PACERONE) 200 MG tablet 2 tablets twice a day for 5 days then 1 tablet twice a day for 5 days then one tablet daily afterwards ?Patient taking differently: Take 200 mg by mouth 2 (two) times daily. 05/15/17  Yes Wieting, Richard, MD  ?celecoxib (CELEBREX) 400 MG capsule Take 400 mg by mouth every morning. 06/18/21  Yes [provider]  ?cyanocobalamin 1000 MCG tablet Take 1,000 mcg by mouth daily.   Yes [provider]  ?docusate sodium (COLACE) 100 MG capsule Take 1 capsule (100 mg  total) by mouth 2 (two) times daily. 10/24/20  Yes Fritzi Mandes, MD  ?Iron, Ferrous Sulfate, 142 (45 Fe) MG TBCR Take 1 tablet by mouth daily.   Yes [provider]  ?lisinopril (PRINIVIL,ZESTRIL) 20 MG tablet Take 20 mg by mouth daily.   Yes [provider]  ?LORazepam (ATIVAN) 1 MG tablet Take 1 mg by mouth 2 (two) times daily.    Yes [provider]  ?Magnesium Oxide 400 (240 Mg) MG TABS Take 1 tablet (400 mg total) by mouth every morning. ?Patient taking differently: Take 800 mg by mouth 2 (two) times daily. 05/16/17  Yes Wieting, Richard, MD  ?metoprolol succinate (TOPROL-XL) 50 MG 24 hr tablet Take 50 mg by mouth daily. Take with or immediately following a meal.   Yes [provider]  ?pioglitazone (ACTOS) 45 MG tablet Take 45 mg by mouth daily.   Yes [provider]  ?polyethylene glycol (MIRALAX / GLYCOLAX) 17 g packet Take 17 g by mouth daily. 10/25/20  Yes Fritzi Mandes, MD  ?rosuvastatin (CRESTOR) 20 MG tablet SMARTSIG:1 Tablet(s) By Mouth Every Evening 04/25/21  Yes [provider]  ?warfarin (COUMADIN) 2 MG tablet Take 4 mg by mouth as directed. Takes 4 mg on mondays and thursdays   Yes [provider]  ?warfarin (COUMADIN) 3 MG tablet Take 3 mg by mouth as directed. Takes 3 mg all days except Mondays and Thursdays   Yes [provider]  ?cyclobenzaprine (FLEXERIL) 5 MG tablet Take 5 mg by mouth 3 (three) times daily as needed for muscle spasms.  ?Patient not taking: Reported on 07/03/2021    [provider]  ?ondansetron (ZOFRAN) 4 MG tablet Take 4 mg by mouth 2 (two) times daily. ?Patient not taking: Reported on 07/03/2021    [provider]  ?pantoprazole (PROTONIX) 40 MG tablet Take 1 tablet (40 mg total) by mouth daily. ?Patient not taking: Reported on 07/03/2021 10/25/20   Fritzi Mandes, MD  ?traMADol-acetaminophen (ULTRACET) 37.5-325 MG tablet Take 2 tablets by mouth 2 (two) times daily as needed for moderate pain. ?Patient  not taking: Reported on 07/03/2021    [provider]  ? ? ?Current Facility-Administered Medications  ?Medication Dose Route Frequency Provider Last Rate Last Admin  ? 0.9 %  sodium chloride infusion  10 mL/hr Intravenous Once Nance Pear, MD   Held at 07/03/21 1900  ? 0.9 %  sodium chloride infusion   Intravenous Continuous Mansy, Jan A, MD 150 mL/hr at 07/04/21 1656 Restarted at 07/04/21 1656  ? acetaminophen (TYLENOL) tablet 650 mg  650 mg Oral Q6H PRN Mansy, Jan A, MD   650 mg at 07/04/21 0641  ? Or  ? acetaminophen (TYLENOL) suppository 650 mg  650 mg Rectal Q6H PRN Mansy, Jan A, MD      ? amiodarone (PACERONE) tablet 200 mg  200 mg Oral BID Mansy, Jan A, MD   200 mg at  07/04/21 0933  ? cyclobenzaprine (FLEXERIL) tablet 5 mg  5 mg Oral TID PRN Mansy, Jan A, MD      ? ferrous sulfate tablet 325 mg  325 mg Oral Daily Mansy, Jan A, MD   325 mg at 07/04/21 0933  ? insulin aspart (novoLOG) injection 0-15 Units  0-15 Units Subcutaneous Q4H Lorella Nimrod, MD      ? iohexol (OMNIPAQUE) 350 MG/ML injection 150 mL  150 mL Intravenous Once PRN Nance Pear, MD      ? LORazepam (ATIVAN) tablet 1 mg  1 mg Oral BID Mansy, Jan A, MD   1 mg at 07/04/21 4920  ? metoprolol succinate (TOPROL-XL) 24 hr tablet 50 mg  50 mg Oral Daily Mansy, Jan A, MD      ? morphine (PF) 2 MG/ML injection 2 mg  2 mg Intravenous Q4H PRN Mansy, Jan A, MD   2 mg at 07/04/21 1703  ? ondansetron (ZOFRAN) tablet 4 mg  4 mg Oral Q6H PRN Mansy, Jan A, MD      ? Or  ? ondansetron [facility]) injection 4 mg  4 mg Intravenous Q6H PRN Mansy, Jan A, MD      ? pioglitazone (ACTOS) tablet 45 mg  45 mg Oral Daily Mansy, Jan A, MD   45 mg at 07/04/21 1007  ? traZODone (DESYREL) tablet 25 mg  25 mg Oral QHS PRN Mansy, Jan A, MD      ? vitamin B-12 (CYANOCOBALAMIN) tablet 1,000 mcg  1,000 mcg Oral Daily Mansy, Jan A, MD   1,000 mcg at 07/04/21 1219  ? ? ?Allergies as of 07/03/2021  ? (No Known Allergies)  ? ? ? ?Review of Systems:    ?All systems  reviewed and negative except where noted in HPI. ? ?Review of Systems  ?Constitutional:  Negative for chills and fever.  ?Respiratory:  Negative for shortness of breath.   ?Cardiovascular:  Negative for chest pain.  ?G

## 2021-07-04 NOTE — Assessment & Plan Note (Signed)
Estimated body mass index is 76.05 kg/m? as calculated from the following: ?  Height as of this encounter: '5\' 10"'$  (1.778 m). ?  Weight as of this encounter: 240.4 kg.  ? ?-This will complicate overall prognosis and management. ?

## 2021-07-04 NOTE — Progress Notes (Signed)
?Progress Note ? ? ?Patient: Russell Ray KZS:[id number] DOB: Oct 18, 1970 DOA: 07/03/2021     1 ?DOS: the patient was seen and examined on 07/04/2021 ?  ?Brief hospital course: ?Taken from H&P. ? ?Russell Ray is a 51 y.o. 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  She was noted to have dark stools as well.  He had 1 episode of bright red bleeding per rectum here in the waiting room.  He admits to lower abdominal cramps.  He denied any fever or chills.  He had nausea without vomiting or heartburn.  No dysuria, oliguria or hematuria or flank pain.  No other bleeding diathesis.  Is currently willing to have PRBCs transfusion if his hemoglobin drops more. ?ED Course: When he came to the ER BP was 108 251 and dropped to 85/48 and later to 67/32 with otherwise normal vital signs initially then later heart rate was up to 120.  With IV hydration BP was up to 125/61 then 110/55 with a heart rate of 94 and later 79.   Labs revealed mild hyponatremia 133 with a BUN of 30 with a calcium of 10.4 with otherwise unremarkable CMP.  CBC showed hemoglobin of 12.3 and hematocrit of 40.3 with repeat levels of 10/32.8 ?Blood group was A+ with negative screen.  ?  ?EKG as reviewed by me : Pending. ? ?The patient was given 10 mg of IV vitamin K as well as Kcentra, 1 L bolus of IV normal saline followed by 150 mill per hour then was given IV Protonix bolus and drip. ?GI was consulted. ? ?5/9: Patient continued to have bleeding per rectum, hemoglobin dropped to 9 this morning.  INR at 1.4 this morning.  Unable to fit in scan for CTA abdomen. ?GI would like to get a RBC scan which was ordered. ?Most likely will go for EGD and colonoscopy as told by GI tomorrow. ?Keep  holding anticoagulation-GI to decide when to resume. ?Patient has a mechanical aortic valve-[facility]. ? ?Patient had similar episode last year in August,  Colonoscopy at that point shows nonbleeding internal hemorrhoids, diverticulosis in the sigmoid colon but there was no evidence of diverticular bleed blood was found in the colon and lavage was done.  Plan was to transfer him to [place] if he continued to bleed, [facility] accepted him but he could not be transferred as there was no bed availability.  Rectal bleed was stopped spontaneously and he was discharged home. ? ? ? ?Assessment and Plan: ?* GI bleeding ?This could be combined upper and lower GI bleeding given bright red bleeding per rectum and melena.,  Hemoglobin at 9 this morning. ?- His Coumadin was reversed with IV vitamin K and he was given Kcentra, repeat INR at 1.4 ?-He was given IV Protonix bolus.  We will continue on IV Protonix drip. ?- She was typed and crossmatched and is currently declining PRBCs transfusion. ?- We will revisit transfusion if he bleeds more or his hemoglobin dropped further. ?-NM tagged RBC scan ordered.-Patient was unable to fit in our scanner for CT abdomen ?-GI is on board-will appreciate their recommendations. ?-Continue to monitor hemoglobin ?-Transfuse if below 8 ? ?Hypotension ?Hemoglobin borderline soft, received IV fluid with positive response.  Most likely secondary to GI bleed. ?-Continue with IV fluid. ?-Might need blood transfusions-currently wants to hold. ?-Continue holding home lisinopril ? ?Paroxysmal atrial fibrillation (HCC) ?EKG shows atrial flutter with variable conduction. ?-Continue with home amiodarone and metoprolol. ?-Holding Coumadin for GI bleed. ? ?Status post aortic valve replacement ?- He has a prosthetic valve. ?- INR is currently 2.6>>1.4 ?- Coumadin is being held due to GI bleed ?- We will defer to our gastroenterologist when to  consider any further  anticoagulation. ? ?Type 2 diabetes mellitus without complication ([place]) ?Home Actos was continued on admission. ?-Start him on every 4 hourly moderate SSI, can be switched to with meals once able to take p.o. ?-Continue to monitor ? ?Dyslipidemia ?- Continue statin therapy. ? ?Essential hypertension ?Currently blood pressure on softer side. ?-Keep holding home lisinopril ?-Continuing home metoprolol ? ?Morbid obesity ([place]) ?Estimated body mass index is 76.05 kg/m? as calculated from the following: ?  Height as of this encounter: '5\' 10"'$  (1.778 m). ?  Weight as of this encounter: 240.4 kg.  ? ?-This will complicate overall prognosis and management. ? ?Subjective: Patient continued to have bloody bowel movements.  He was also concerned about his Coumadin because of having a mechanical valve.  Stating that he had similar episode last year in August.  ? ?Physical Exam: ?Vitals:  ? 07/04/21 0630 07/04/21 0730 07/04/21 1030 07/04/21 1040  ?BP: (!) 93/49 (!) 97/57 104/63 (!) 129/55  ?Pulse: 85 96  97  ?Resp: '12 16  16  '$ ?Temp:      ?TempSrc:      ?SpO2: 97% 100% 96% 100%  ?Weight:      ?Height:      ? ?General.  Morbidly obese gentleman, in no acute distress. ?Pulmonary.  Lungs clear bilaterally, normal respiratory effort. ?CV.  Regular rate and rhythm, no JVD, rub or murmur. ?Abdomen.  Soft, nontender, nondistended, BS positive. ?CNS.  Alert and oriented .  No focal neurologic deficit. ?Extremities.  No edema, no cyanosis, pulses intact and symmetrical. ?Psychiatry.  Judgment and insight appears normal. ? ?Data Reviewed: ?Prior notes, labs and images reviewed. ? ?Family Communication: Discussed with patient and brother at bedside ? ?Disposition: ?Status is: Inpatient ?Remains inpatient appropriate because: Severity of illness ? ? Planned Discharge Destination: Home ? ? ?Time spent: 50 minutes ? ?This record has been created using Systems analyst. Errors have been sought and corrected,but may not always be  located. Such creation errors do not reflect on the standard of care. ? ?Author: ?Lorella Nimrod, MD ?07/04/2021 12:04 PM ? ?For on call review [email or web address].  ?

## 2021-07-04 NOTE — Hospital Course (Addendum)
Taken from H&P. ? ?Kaylum Shrum is a 51 y.o. 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  She was noted to have dark stools as well.  He had 1 episode of bright red bleeding per rectum here in the waiting room.  He admits to lower abdominal cramps.  He denied any fever or chills.  He had nausea without vomiting or heartburn.  No dysuria, oliguria or hematuria or flank pain.  No other bleeding diathesis.  Is currently willing to have PRBCs transfusion if his hemoglobin drops more. ?ED Course: When he came to the ER BP was 108 251 and dropped to 85/48 and later to 67/32 with otherwise normal vital signs initially then later heart rate was up to 120.  With IV hydration BP was up to 125/61 then 110/55 with a heart rate of 94 and later 79.   Labs revealed mild hyponatremia 133 with a BUN of 30 with a calcium of 10.4 with otherwise unremarkable CMP.  CBC showed hemoglobin of 12.3 and hematocrit of 40.3 with repeat levels of 10/32.8 ?Blood group was A+ with negative screen.  ?  ?EKG as reviewed by me : Pending. ? ?The patient was given 10 mg of IV vitamin K as well as Kcentra, 1 L bolus of IV normal saline followed by 150 mill per hour then was given IV Protonix bolus and drip. ?GI was consulted. ? ?5/9: Patient continued to have bleeding per rectum, hemoglobin dropped to 9 this morning.  INR at 1.4 this morning.  Unable to fit in scan for CTA abdomen. ?GI would like to get a RBC scan which was ordered. ?Most likely will go for EGD and colonoscopy as told by GI tomorrow. ?Keep holding anticoagulation-GI to decide when to resume. ?Patient has a mechanical aortic valve-[facility]. ? ?Patient had similar  episode last year in August,  Colonoscopy at that point shows nonbleeding internal hemorrhoids, diverticulosis in the sigmoid colon but there was no evidence of diverticular bleed blood was found in the colon and lavage was done.  Plan was to transfer him to [place] if he continued to bleed, [facility] accepted him but he could not be transferred as there was no bed availability.  Rectal bleed was stopped spontaneously and he was discharged home. ? ?

## 2021-07-05 DIAGNOSIS — I10 Essential (primary) hypertension: Secondary | ICD-10-CM | POA: Diagnosis not present

## 2021-07-05 DIAGNOSIS — I959 Hypotension, unspecified: Secondary | ICD-10-CM

## 2021-07-05 DIAGNOSIS — Z952 Presence of prosthetic heart valve: Secondary | ICD-10-CM

## 2021-07-05 DIAGNOSIS — K922 Gastrointestinal hemorrhage, unspecified: Secondary | ICD-10-CM

## 2021-07-05 DIAGNOSIS — E119 Type 2 diabetes mellitus without complications: Secondary | ICD-10-CM

## 2021-07-05 DIAGNOSIS — E785 Hyperlipidemia, unspecified: Secondary | ICD-10-CM

## 2021-07-05 DIAGNOSIS — I480 Paroxysmal atrial fibrillation: Secondary | ICD-10-CM

## 2021-07-05 LAB — GLUCOSE, CAPILLARY
Glucose-Capillary: 110 mg/dL — ABNORMAL HIGH (ref 70–99)
Glucose-Capillary: 117 mg/dL — ABNORMAL HIGH (ref 70–99)
Glucose-Capillary: 114 mg/dL — ABNORMAL HIGH (ref 70–99)

## 2021-07-05 LAB — CBC
HCT: 28.1 % — ABNORMAL LOW (ref 39.0–52.0)
Hemoglobin: 8.9 g/dL — ABNORMAL LOW (ref 13.0–17.0)
MCH: 28.9 pg (ref 26.0–34.0)
MCHC: 31.7 g/dL (ref 30.0–36.0)
MCV: 91.2 fL (ref 80.0–100.0)
Platelets: 130 10*3/uL — ABNORMAL LOW (ref 150–400)
RBC: 3.08 MIL/uL — ABNORMAL LOW (ref 4.22–5.81)
RDW: 16.5 % — ABNORMAL HIGH (ref 11.5–15.5)
WBC: 6.7 10*3/uL (ref 4.0–10.5)
nRBC: 0.3 % — ABNORMAL HIGH (ref 0.0–0.2)

## 2021-07-05 LAB — PROTIME-INR
INR: 1.1 (ref 0.8–1.2)
Prothrombin Time: 14.2 seconds (ref 11.4–15.2)

## 2021-07-05 LAB — HEPARIN LEVEL (UNFRACTIONATED): Heparin Unfractionated: 0.24 IU/mL — ABNORMAL LOW (ref 0.30–0.70)

## 2021-07-05 MED ORDER — HEPARIN BOLUS VIA INFUSION
4000.0000 [IU] | Freq: Once | INTRAVENOUS | Status: AC
  Administered 2021-07-05: 4000 [IU] via INTRAVENOUS
  Filled 2021-07-05: qty 4000

## 2021-07-05 MED ORDER — POLYETHYLENE GLYCOL 3350 17 G PO PACK
17.0000 g | PACK | Freq: Every day | ORAL | Status: DC
  Filled 2021-07-05 (×3): qty 1

## 2021-07-05 MED ORDER — RISAQUAD PO CAPS
1.0000 | ORAL_CAPSULE | Freq: Every day | ORAL | Status: DC
  Administered 2021-07-05 – 2021-07-07 (×3): 1 via ORAL
  Filled 2021-07-05 (×3): qty 1

## 2021-07-05 MED ORDER — HEPARIN BOLUS VIA INFUSION
2000.0000 [IU] | Freq: Once | INTRAVENOUS | Status: AC
  Administered 2021-07-05: 2000 [IU] via INTRAVENOUS
  Filled 2021-07-05: qty 2000

## 2021-07-05 MED ORDER — HEPARIN (PORCINE) 25000 UT/250ML-% IV SOLN
2400.0000 [IU]/h | INTRAVENOUS | Status: DC
  Administered 2021-07-05: 2200 [IU]/h via INTRAVENOUS
  Administered 2021-07-05: 1900 [IU]/h via INTRAVENOUS
  Administered 2021-07-06 (×2): 2200 [IU]/h via INTRAVENOUS
  Administered 2021-07-07: 2400 [IU]/h via INTRAVENOUS
  Filled 2021-07-05 (×5): qty 250

## 2021-07-05 NOTE — Consult Note (Signed)
ANTICOAGULATION CONSULT NOTE ? ?Pharmacy Consult for heparin infusion ?Indication: prosthetic aortic valve, afib- warfarin PTA ? ?No Known Allergies ? ?Patient Measurements: ?Height: '5\' 10"'$  (177.8 cm) ?Weight: (!) 240.4 kg (530 lb) ?IBW/kg (Calculated) : 73 ?Heparin Dosing Weight: 136 kg ? ?Vital Signs: ?Temp: 98.2 ?F (36.8 ?C) (05/10 1523) ?Temp Source: Oral (05/10 1105) ?BP: 134/61 (05/10 1523) ?Pulse Rate: 76 (05/10 1523) ? ?Labs: ?Recent Labs  ?   ?0000 07/03/21 ?1336 07/03/21 ?1337 07/03/21 ?1820 07/03/21 ?2226 07/04/21 ?0354 07/04/21 ?1222 07/05/21 ?0451  ?HGB  --  12.3*  --    < > 10.2* 9.0* 8.9* 8.9*  ?HCT  --  40.3  --    < > 33.2* 29.2* 28.7* 28.1*  ?PLT  --  205  --   --   --  151  --  130*  ?APTT  --   --  38*  --   --   --   --   --   ?LABPROT   < >  --  27.5*  --  17.3* 16.8*  --  14.2  ?INR   < >  --  2.6*  --  1.4* 1.4*  --  1.1  ?CREATININE  --  0.85  --   --   --  0.89  --   --   ? < > = values in this interval not displayed.  ? ? ? ?Estimated Creatinine Clearance: 198.8 mL/min (by C-G formula based on SCr of 0.89 mg/dL). ? ? ?Medical History: ?Past Medical History:  ?Diagnosis Date  ? Anxiety   ? Aortic valve stenosis   ? Atrial fibrillation ([place])   ? CHF (congestive heart failure) ([place])   ? Depression   ? Diabetes mellitus without complication ([place])   ? Hypertension   ? ? ?Medications:  ?PTA warfarin regimen: ?Warfarin 4 mg Mondays, Thursdays; Warfarin 3 mg all other days  ?TWD: 23 mg ?Vitamin K 10 mg IV x 1 : 5/8 @ 1930 ?Kcentra: 5/8 @ 2000 ? ?Assessment: ?51 year old male presented to [facility] 07/03/21 with acute onset of bright red bleeding per rectum suspected 2/2 diverticular bleeding. 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 INR 2.6 (therapeutic) on admission. Warfarin reversed with Kcentra and Vitamin K in ED on 07/03/21. Patient deferring colonoscopy. Pharmacy has been consulted to initiate IV heparin therapy at this time. ? ?Goal of Therapy:  ?INR  2.5-3.5 ?Heparin level 0.3-0.7 units/ml ?Monitor platelets by anticoagulation protocol: Yes ?  ?Plan:  ?Heparin level SUBtherapeutic: Give bolus of 2000 units bolus x 1 ?Increase heparin infusion rate to 2200 units/hr ?Check anti-Xa level in 6 hours after rate change and daily while on heparin ?Continue to monitor H&H and platelets ? ?Dallie Piles, PharmD, BCPS ?Clinical Pharmacist   ?07/05/2021,5:15 PM ? ? ?

## 2021-07-05 NOTE — TOC Initial Note (Signed)
Transition of Care (TOC) - Initial/Assessment Note  ? ? ?Patient Details  ?Name: Savoy Somerville ?MRN: [id number] ?Date of Birth: November 19, 1970 ? ?Transition of Care (TOC) CM/SW Contact:    ?Laurena Slimmer, RN ?Phone Number: ?07/05/2021, 3:58 PM ? ?Clinical Narrative:                 ? ?Transition of Care (TOC) Screening Note ? ? ?Patient Details  ?Name: Shant Hence ?Date of Birth: [birth year] ? ? ?Transition of Care (TOC) CM/SW Contact:    ?Laurena Slimmer, RN ?Phone Number: ?07/05/2021, 3:58 PM ? ? ? ?Transition of Care Department [facility]) has reviewed patient and no TOC needs have been identified at this time. We will continue to monitor patient advancement through interdisciplinary progression rounds. If new patient transition needs arise, please place a TOC consult. ? ? ? ?  ?  ? ? ?Patient Goals and CMS Choice ?  ?  ?  ? ?Expected Discharge Plan and Services ?  ?  ?  ?  ?  ?                ?  ?  ?  ?  ?  ?  ?  ?  ?  ?  ? ?Prior Living Arrangements/Services ?  ?  ?  ?       ?  ?  ?  ?  ? ?Activities of Daily Living ?Home Assistive Devices/Equipment: Cane (specify quad or straight) ?ADL Screening (condition at time of admission) ?Patient's cognitive ability adequate to safely complete daily activities?: Yes ?Is the patient deaf or have difficulty hearing?: No ?Does the patient have difficulty seeing, even when wearing glasses/contacts?: No ?Does the patient have difficulty concentrating, remembering, or making decisions?: No ?Patient able to express need for assistance with ADLs?: Yes ?Does the patient have difficulty dressing or bathing?: No ?Independently performs ADLs?: Yes (appropriate for developmental age) ?Does the patient have difficulty walking or climbing stairs?: Yes ?Weakness of Legs: None ?Weakness of Arms/Hands: None ? ?Permission Sought/Granted ?  ?  ?   ?   ?   ?   ? ?Emotional Assessment ?  ?  ?  ?  ?  ?  ? ?Admission diagnosis:  GI bleeding [K92.2] ?Gastrointestinal hemorrhage, unspecified gastrointestinal  hemorrhage type [K92.2] ?Patient Active Problem List  ? Diagnosis Date Noted  ? GI bleeding 07/03/2021  ? Paroxysmal atrial fibrillation ([employer]) 07/03/2021  ? Essential hypertension 07/03/2021  ? Hypotension 07/03/2021  ? Dyslipidemia 07/03/2021  ? Status post aortic valve replacement 07/03/2021  ? Lower GI bleeding 10/20/2020  ? Anemia due to blood loss 10/20/2020  ? Ventral hernia with bowel obstruction 12/01/2016  ? Atrial fibrillation ([place]) 05/17/2015  ? Elevated troponin 05/05/2015  ? Obesity 05/05/2015  ? Obstructive sleep apnea 05/05/2015  ? Hyponatremia 05/05/2015  ? Leukocytosis 05/05/2015  ? Iron deficiency anemia 05/05/2015  ? Anticoagulated on Coumadin 05/05/2015  ? H/O aortic valve replacement 05/05/2015  ? A-fib ([place]) 05/03/2015  ? Atrial fibrillation with RVR ([place]) 05/03/2015  ? Anxiety 04/23/2015  ? Chronic diastolic CHF (congestive heart failure) ([place]) 04/23/2015  ? Essential (primary) hypertension 04/23/2015  ? Type 2 diabetes mellitus without complication ([place]) 70/96/2836  ? Chest pain, central 04/22/2015  ? Morbid obesity with BMI of 50.0-59.9, adult ([place]) 04/22/2015  ? Morbid obesity ([place]) 03/19/2015  ? Leg swelling 03/19/2015  ? Orthopnea 03/19/2015  ? Tobacco abuse 03/19/2015  ? 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 ?PCP:  [name], MD ?Pharmacy:   ?[id number] [id number]?Phone: [phone]: [phone]? ? ? ? ?Social Determinants of Health (SDOH) Interventions ?  ? ?Readmission Risk Interventions ?   ? View : No data to display.  ?  ?  ?  ? ? ? ?

## 2021-07-05 NOTE — Progress Notes (Signed)
GI Inpatient Follow-up Note ? ?Subjective: ? ?Patient seen and no acute events overnight. No bowel movements. He did have some dark stool after eating breakfast this morning which is likely do to old blood. Hemoglobin stable. ? ?Scheduled Inpatient Medications:  ? amiodarone  200 mg Oral BID  ? ferrous sulfate  325 mg Oral Daily  ? insulin aspart  0-15 Units Subcutaneous Q4H  ? LORazepam  1 mg Oral BID  ? metoprolol succinate  50 mg Oral Daily  ? pioglitazone  45 mg Oral Daily  ? cyanocobalamin  1,000 mcg Oral Daily  ? ? ?Continuous Inpatient Infusions: ?  ? sodium chloride Stopped (07/03/21 1900)  ? sodium chloride Stopped (07/05/21 0016)  ? heparin 1,900 Units/hr (07/05/21 0914)  ? ? ?PRN Inpatient Medications:  ?acetaminophen **OR** acetaminophen, cyclobenzaprine, iohexol, morphine injection, ondansetron **OR** ondansetron (ZOFRAN) IV, traZODone ? ?Review of Systems: ? ?Review of Systems  ?Constitutional:  Negative for chills and fever.  ?Respiratory:  Positive for shortness of breath.   ?Cardiovascular:  Negative for chest pain.  ?Gastrointestinal:  Negative for abdominal pain and vomiting.  ?Genitourinary:  Positive for frequency.  ?Musculoskeletal:  Positive for joint pain.  ?Skin:  Negative for rash.  ?Neurological:  Negative for focal weakness.  ?Psychiatric/Behavioral:  Negative for substance abuse.   ?All other systems reviewed and are negative. ? ?  ?Physical Examination: ?BP (!) 140/91 (BP Location: Left Wrist)   Pulse (!) 104   Temp 97.8 ?F (36.6 ?C) (Oral)   Resp 20   Ht '5\' 10"'$  (1.778 m)   Wt (!) 240.4 kg   SpO2 97%   BMI 76.05 kg/m?  ?Gen: NAD, alert and oriented x 4 ?HEENT: PEERLA, EOMI, ?Neck: supple, no JVD or thyromegaly ?Chest: No respiratory distress ?Abd: soft, non-tender, non-distended ?Ext: no edema, well perfused with 2+ pulses, ?Skin: no rash or lesions noted ?Lymph: no lymphadenopathy ? ?Data: ?Lab Results  ?Component Value Date  ? WBC 6.7 07/05/2021  ? HGB 8.9 (L) 07/05/2021  ? HCT  28.1 (L) 07/05/2021  ? MCV 91.2 07/05/2021  ? PLT 130 (L) 07/05/2021  ? ?Recent Labs  ?Lab 07/04/21 ?0415 07/04/21 ?1222 07/05/21 ?0451  ?HGB 9.0* 8.9* 8.9*  ? ?Lab Results  ?Component Value Date  ? NA 135 07/04/2021  ? K 4.4 07/04/2021  ? CL 105 07/04/2021  ? CO2 25 07/04/2021  ? BUN 37 (H) 07/04/2021  ? CREATININE 0.89 07/04/2021  ? ?Lab Results  ?Component Value Date  ? ALT 25 07/03/2021  ? AST 23 07/03/2021  ? ALKPHOS 67 07/03/2021  ? BILITOT 1.0 07/03/2021  ? ?Recent Labs  ?Lab 07/03/21 ?1337 07/03/21 ?2226 07/05/21 ?0451  ?APTT 38*  --   --   ?INR 2.6*   < > 1.1  ? < > = values in this interval not displayed.  ? ?Assessment/Plan: ?Mr. Capaldi is a 51 y.o. gentleman with morbid obesity, mechanical aortic valve, a. Fib, hypertension, and DM II who is here with suspected recurrent diverticular bleeding. Discussed risks/benefits of proceeding with colonoscopy and patient has elected to defer colonoscopy given he had one 7 months ago showing just diverticulosis. His morbid obesity makes any procedure difficult and sedation difficult as well. He has not had any hemodynamically significant bleeding today. Heparin drip started due to need for anticoagulation and mechanical mitral valve. Will monitor closely for recurrent bleeding. ? ?Recommendations: ? ?- daily cbc's ?- maintain active type and screen ?- transfuse if hemoglobin < 7 ?- bowel regimen ?- monitor  for any recurrent hemodynamically significant GI bleeding ?  ?Please call with any questions or concerns. Will continue to follow. ? ?Raylene Miyamoto MD, MPH ?[facility] ? ?  ?

## 2021-07-05 NOTE — Plan of Care (Signed)
Alert and oriented. Reports no signs of bleeding when using the restroom. Refusing ordered glucometer checks and IV fluids. Educated about orders while voicing understanding. Pt. states that he is aware of his treatment plan. No acute issues noted during this shift. Call bell within reach. Instructed to call for assistance.  ?

## 2021-07-05 NOTE — Consult Note (Signed)
ANTICOAGULATION CONSULT NOTE - Initial Consult ? ?Pharmacy Consult for heparin infusion ?Indication: prosthetic aortic valve, afib- warfarin PTA ? ?No Known Allergies ? ?Patient Measurements: ?Height: '5\' 10"'$  (177.8 cm) ?Weight: (!) 240.4 kg (530 lb) ?IBW/kg (Calculated) : 73 ?Heparin Dosing Weight: 136 kg ? ?Vital Signs: ?Temp: 98 ?F (36.7 ?C) (05/10 0741) ?Temp Source: Oral (05/10 0403) ?BP: 136/93 (05/10 0741) ?Pulse Rate: 110 (05/10 0741) ? ?Labs: ?Recent Labs  ?   ?0000 07/03/21 ?1336 07/03/21 ?1337 07/03/21 ?1820 07/03/21 ?2226 07/04/21 ?8280 07/04/21 ?1222 07/05/21 ?0451  ?HGB  --  12.3*  --    < > 10.2* 9.0* 8.9* 8.9*  ?HCT  --  40.3  --    < > 33.2* 29.2* 28.7* 28.1*  ?PLT  --  205  --   --   --  151  --  130*  ?APTT  --   --  38*  --   --   --   --   --   ?LABPROT   < >  --  27.5*  --  17.3* 16.8*  --  14.2  ?INR   < >  --  2.6*  --  1.4* 1.4*  --  1.1  ?CREATININE  --  0.85  --   --   --  0.89  --   --   ? < > = values in this interval not displayed.  ? ? ?Estimated Creatinine Clearance: 198.8 mL/min (by C-G formula based on SCr of 0.89 mg/dL). ? ? ?Medical History: ?Past Medical History:  ?Diagnosis Date  ? Anxiety   ? Aortic valve stenosis   ? Atrial fibrillation ([place])   ? CHF (congestive heart failure) ([place])   ? Depression   ? Diabetes mellitus without complication ([place])   ? Hypertension   ? ? ?Medications:  ?PTA warfarin regimen: ?Warfarin 4 mg Mondays, Thursdays; Warfarin 3 mg all other days  ?TWD: 23 mg ?Vitamin K 10 mg IV x 1 : 5/8 @ 1930 ?Kcentra: 5/8 @ 2000 ? ?Assessment: ?51 year old male presented to [facility] 07/03/21 with acute onset of bright red bleeding per rectum suspected 2/2 diverticular bleeding. 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 INR 2.6 (therapeutic) on admission. Warfarin reversed with Kcentra and Vitamin K in ED on 07/03/21. Patient deferring colonoscopy. Pharmacy has been consulted to initiate IV heparin therapy at this time. ? ?Goal of  Therapy:  ?INR 2.5-3.5 ?Heparin level 0.3-0.7 units/ml ?Monitor platelets by anticoagulation protocol: Yes ?  ?Plan:  ?Give  reduced bolus (recent GIB) of 4000 units bolus x 1 ?Start heparin infusion at 1900 units/hr ?Check anti-Xa level in 6 hours and daily while on heparin ?Continue to monitor H&H and platelets ? ?Dorothe Pea, PharmD, BCPS ?Clinical Pharmacist   ?07/05/2021,8:37 AM ? ? ?

## 2021-07-05 NOTE — Progress Notes (Signed)
?PROGRESS NOTE ? ? ? ?Russell Ray  EZM:[id number] DOB: Aug 22, 1970 DOA: 07/03/2021 ?PCP: [name], MD  ? ? ?Brief Narrative:  ?Ray Russell is a 51 y.o. 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 ? ? ?Consultants:  ?GI ? ?Procedures:  ? ?Antimicrobials:  ?  ? ? ?Subjective: ?Had bm after eating and had marroon colored stool. Multiple compliants about food. C/o diarrhea.  ? ?Objective: ?Vitals:  ? 07/05/21 0403 07/05/21 0741 07/05/21 1105 07/05/21 1523  ?BP: 124/60 (!) 136/93 (!) 140/91 134/61  ?Pulse: (!) 108 (!) 110 (!) 104 76  ?Resp: '16 19 20 19  '$ ?Temp: (!) 97.5 ?F (36.4 ?C) 98 ?F (36.7 ?C) 97.8 ?F (36.6 ?C) 98.2 ?F (36.8 ?C)  ?TempSrc: Oral  Oral   ?SpO2: 96% 98% 97% 97%  ?Weight:      ?Height:      ? ? ?Intake/Output Summary (Last 24 hours) at 07/05/2021 1604 ?Last data filed at 07/05/2021 1500 ?Gross per 24 hour  ?Intake 4662.22 ml  ?Output --  ?Net 4662.22 ml  ? ?Filed Weights  ? 07/03/21 1335  ?Weight: (!) 240.4 kg  ? ? ?Examination: ?Calm, NAD, in foul mood ?Cta no w/r ?Reg s1/s2 no gallop ?Soft benign +bs ?No edema ?Aaoxox3  ?Mood and affect appropriate in current setting  ? ? ?Data Reviewed: I have personally reviewed following labs and imaging studies ? ?CBC: ?Recent Labs  ?Lab 07/03/21 ?1336 07/03/21 ?1820 07/03/21 ?2226 07/04/21 ?5035 07/04/21 ?1222 07/05/21 ?0451  ?WBC 8.5  --   --  8.9  --  6.7  ?HGB 12.3* 10.0* 10.2* 9.0* 8.9* 8.9*  ?HCT 40.3 32.8* 33.2* 29.2* 28.7* 28.1*  ?MCV 91.4  --   --  91.0  --  91.2  ?PLT 205  --   --  151  --  130*  ? ?Basic Metabolic Panel: ?Recent Labs  ?Lab 07/03/21 ?1336 07/04/21 ?0415  ?NA 133* 135  ?K 4.7 4.4  ?CL 101 105  ?CO2 27 25  ?GLUCOSE 119* 117*  ?BUN 30* 37*  ?CREATININE 0.85 0.89  ?CALCIUM 10.4* 9.2   ? ?GFR: ?Estimated Creatinine Clearance: 198.8 mL/min (by C-G formula based on SCr of 0.89 mg/dL). ?Liver Function Tests: ?Recent Labs  ?Lab 07/03/21 ?1336  ?AST 23  ?ALT 25  ?ALKPHOS 67  ?BILITOT 1.0  ?PROT 7.0  ?ALBUMIN 4.0  ? ?No results for input(s): LIPASE, AMYLASE in the last 168 hours. ?No results for input(s): AMMONIA in the last 168 hours. ?Coagulation Profile: ?Recent Labs  ?Lab 07/03/21 ?1337 07/03/21 ?2226 07/04/21 ?4656 07/05/21 ?0451  ?INR 2.6* 1.4* 1.4* 1.1  ? ?Cardiac Enzymes: ?No results for input(s): CKTOTAL, CKMB, CKMBINDEX, TROPONINI in the last 168 hours. ?BNP (last 3 results) ?No results for input(s): PROBNP in the last 8760 hours. ?HbA1C: ?Recent Labs  ?  07/04/21 ?1222  ?HGBA1C 6.2*  ? ?CBG: ?Recent Labs  ?Lab 07/04/21 ?1217  ?GLUCAP 111*  ? ?Lipid Profile: ?No results for input(s): CHOL, HDL, LDLCALC, TRIG, CHOLHDL, LDLDIRECT in the last 72 hours. ?Thyroid Function Tests: ?No results for input(s): TSH, T4TOTAL, FREET4, T3FREE, THYROIDAB in the last 72 hours. ?Anemia Panel: ?No results for input(s): VITAMINB12, FOLATE, FERRITIN, TIBC, IRON, RETICCTPCT in the last 72 hours. ?Sepsis Labs: ?No results for input(s): PROCALCITON, LATICACIDVEN in  the last 168 hours. ? ?No results found for this or any previous visit (from the past 240 hour(s)).  ? ? ? ? ? ?Radiology Studies: ?NM GI Blood Loss ? ?Result Date: 07/04/2021 ?CLINICAL DATA:  GI bleed EXAM: NUCLEAR MEDICINE GASTROINTESTINAL BLEEDING SCAN TECHNIQUE: Sequential abdominal images were obtained following intravenous administration of Tc-71mpertechnetate labeled red blood cells. RADIOPHARMACEUTICALS:  26.70 mCi Tc-942mertechnetate in-vitro labeled red cells. COMPARISON:  10/19/2020 Correlation: CT abdomen and pelvis 12/01/2016 FINDINGS: Mild abnormal tracer localization is identified and curvilinear orientation in LEFT lower quadrant superolateral to the urinary bladder. This corresponds with the course of the sigmoid colon on prior CT.  Findings are consistent with a small volume GI bleed of the sigmoid colon. Otherwise expected normal blood pool of tracer. IMPRESSION: Small volume GI bleed located at the sigmoid colon. Electronically Signed   By: MaLavonia Dana.D.   On: 07/04/2021 16:30   ? ? ? ? ? ?Scheduled Meds: ? amiodarone  200 mg Oral BID  ? ferrous sulfate  325 mg Oral Daily  ? insulin aspart  0-15 Units Subcutaneous Q4H  ? LORazepam  1 mg Oral BID  ? metoprolol succinate  50 mg Oral Daily  ? pioglitazone  45 mg Oral Daily  ? polyethylene glycol  17 g Oral Daily  ? cyanocobalamin  1,000 mcg Oral Daily  ? ?Continuous Infusions: ? sodium chloride Stopped (07/03/21 1900)  ? sodium chloride Stopped (07/05/21 0016)  ? heparin 1,900 Units/hr (07/05/21 0914)  ? ? ?Assessment & Plan: ?  ?Principal Problem: ?  GI bleeding ?Active Problems: ?  Hypotension ?  Paroxysmal atrial fibrillation (HCC) ?  Type 2 diabetes mellitus without complication (HCC) ?  Status post aortic valve replacement ?  Dyslipidemia ?  Essential hypertension ?  Morbid obesity ([place]? ? 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 Bleed ?Gi ok resuming heparin gtt, monitor h/h closely and bleeding ? ?  ?Paroxysmal atrial fibrillation (HCC) ?On amiodarone metoprolol  ?Anticoagulation held due to GI bleed as above  ?On heparin drip  ? ? ?  ?Status post aortic valve replacement ?- He has a prosthetic valve. ?5/10 on heparin gtt as above ?Once cleared by gi will resume coumadin ?  ?Type 2 diabetes mellitus without complication (HCC) ?Home Actos was continued on admission. ?5/10 refused FS checks. Spoke to him importance of checking his BG here, but c/o too many sticks and bruising and that at home his sugars are stable. ?  ?Dyslipidemia ?- Continue statin therapy. ?  ?Essential hypertension ?Currently  blood pressure on softer side. ?-Keep holding home lisinopril ?-Continuing home metoprolol ?  ?Morbid obesity ([place]?Estimated body mass index is 76.05 kg/m? as calculated from the following: ?  Height as of this encounter: '5\' 10"'$  (1.778 m). ?  Weight as of this encounter: 240.4 kg.  ? ? ?DVT prophylaxis: SCD ?Code Status: Full ?Family Communication: None at bedside ?Disposition Plan: Return back home in 1 to 2 days ?Status is: Inpatient ?Remains inpatient appropriate because: IV treatment, patient with GI bleed ?  ? ? ? ? ? LOS: 2 days  ? ?Time spent: 35 minutes ? ? ? ?SaNolberto HanlonMD ?[facility] ?Pager [phone] ? ?If 7PM-7AM, please contact night-coverage ?07/05/2021, 4:04 PM   ?

## 2021-07-05 NOTE — Progress Notes (Signed)
Mobility Specialist - Progress Note ? ? ? 07/05/21 1600  ?Mobility  ?Activity Refused mobility  ? ? ?Pt semi supine in bed upon arrival using RA. Pt declines mobility despite max encouragement. Will attempt at a later date and time. ? ?Merrily Brittle ?Mobility Specialist ?07/05/21, 4:31 PM ? ? ? ? ?

## 2021-07-06 DIAGNOSIS — I10 Essential (primary) hypertension: Secondary | ICD-10-CM | POA: Diagnosis not present

## 2021-07-06 DIAGNOSIS — I959 Hypotension, unspecified: Secondary | ICD-10-CM | POA: Diagnosis not present

## 2021-07-06 DIAGNOSIS — E785 Hyperlipidemia, unspecified: Secondary | ICD-10-CM | POA: Diagnosis not present

## 2021-07-06 DIAGNOSIS — K922 Gastrointestinal hemorrhage, unspecified: Secondary | ICD-10-CM | POA: Diagnosis not present

## 2021-07-06 LAB — BPAM RBC
Blood Product Expiration Date: 202305232359
Blood Product Expiration Date: 202305232359
ISSUE DATE / TIME: 202305081910
ISSUE DATE / TIME: 202305081910
Unit Type and Rh: 6200
Unit Type and Rh: 6200

## 2021-07-06 LAB — CBC
HCT: 26.4 % — ABNORMAL LOW (ref 39.0–52.0)
Hemoglobin: 8.3 g/dL — ABNORMAL LOW (ref 13.0–17.0)
MCH: 28.8 pg (ref 26.0–34.0)
MCHC: 31.4 g/dL (ref 30.0–36.0)
MCV: 91.7 fL (ref 80.0–100.0)
Platelets: 129 10*3/uL — ABNORMAL LOW (ref 150–400)
RBC: 2.88 MIL/uL — ABNORMAL LOW (ref 4.22–5.81)
RDW: 16.5 % — ABNORMAL HIGH (ref 11.5–15.5)
WBC: 6.7 10*3/uL (ref 4.0–10.5)
nRBC: 0.4 % — ABNORMAL HIGH (ref 0.0–0.2)

## 2021-07-06 LAB — TYPE AND SCREEN
ABO/RH(D): A POS
Antibody Screen: NEGATIVE
Unit division: 0
Unit division: 0

## 2021-07-06 LAB — PREPARE RBC (CROSSMATCH)

## 2021-07-06 LAB — HEPARIN LEVEL (UNFRACTIONATED)
Heparin Unfractionated: 0.31 IU/mL (ref 0.30–0.70)
Heparin Unfractionated: 0.3 IU/mL (ref 0.30–0.70)

## 2021-07-06 LAB — GLUCOSE, CAPILLARY
Glucose-Capillary: 99 mg/dL (ref 70–99)
Glucose-Capillary: 101 mg/dL — ABNORMAL HIGH (ref 70–99)
Glucose-Capillary: 117 mg/dL — ABNORMAL HIGH (ref 70–99)
Glucose-Capillary: 125 mg/dL — ABNORMAL HIGH (ref 70–99)

## 2021-07-06 MED ORDER — SODIUM CHLORIDE 0.9 % IV SOLN
250.0000 mg | Freq: Once | INTRAVENOUS | Status: AC
  Administered 2021-07-06: 250 mg via INTRAVENOUS
  Filled 2021-07-06: qty 20

## 2021-07-06 NOTE — Care Management Important Message (Signed)
Important Message ? ?Patient Details  ?Name: Russell Ray ?MRN: [id number] ?Date of Birth: 10/03/1970 ? ? ?Medicare Important Message Given:  Yes ? ? ? ? ?Dannette Barbara ?07/06/2021, 3:47 PM ?

## 2021-07-06 NOTE — Consult Note (Signed)
ANTICOAGULATION CONSULT NOTE ? ?Pharmacy Consult for heparin infusion ?Indication: prosthetic aortic valve, afib- warfarin PTA ? ?No Known Allergies ? ?Patient Measurements: ?Height: '5\' 10"'$  (177.8 cm) ?Weight: (!) 240.4 kg (530 lb) ?IBW/kg (Calculated) : 73 ?Heparin Dosing Weight: 136 kg ? ?Vital Signs: ?Temp: 98.3 ?F (36.8 ?C) (05/10 2346) ?Temp Source: Oral (05/10 2346) ?BP: 109/61 (05/10 2346) ?Pulse Rate: 110 (05/10 2346) ? ?Labs: ?Recent Labs  ?   ?0000 07/03/21 ?1336 07/03/21 ?1337 07/03/21 ?1820 07/03/21 ?2226 07/04/21 ?0415 07/04/21 ?1222 07/05/21 ?0451 07/05/21 ?1636 07/06/21 ?4081  ?HGB  --  12.3*  --    < > 10.2* 9.0* 8.9* 8.9*  --   --   ?HCT  --  40.3  --    < > 33.2* 29.2* 28.7* 28.1*  --   --   ?PLT  --  205  --   --   --  151  --  130*  --   --   ?APTT  --   --  38*  --   --   --   --   --   --   --   ?LABPROT   < >  --  27.5*  --  17.3* 16.8*  --  14.2  --   --   ?INR   < >  --  2.6*  --  1.4* 1.4*  --  1.1  --   --   ?HEPARINUNFRC  --   --   --   --   --   --   --   --  0.24* 0.31  ?CREATININE  --  0.85  --   --   --  0.89  --   --   --   --   ? < > = values in this interval not displayed.  ? ? ? ?Estimated Creatinine Clearance: 198.8 mL/min (by C-G formula based on SCr of 0.89 mg/dL). ? ? ?Medical History: ?Past Medical History:  ?Diagnosis Date  ? Anxiety   ? Aortic valve stenosis   ? Atrial fibrillation ([place])   ? CHF (congestive heart failure) ([place])   ? Depression   ? Diabetes mellitus without complication ([place])   ? Hypertension   ? ? ?Medications:  ?PTA warfarin regimen: ?Warfarin 4 mg Mondays, Thursdays; Warfarin 3 mg all other days  ?TWD: 23 mg ?Vitamin K 10 mg IV x 1 : 5/8 @ 1930 ?Kcentra: 5/8 @ 2000 ? ?Assessment: ?51 year old male presented to [facility] 07/03/21 with acute onset of bright red bleeding per rectum suspected 2/2 diverticular bleeding. 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 INR 2.6 (therapeutic) on admission. Warfarin reversed with Kcentra  and Vitamin K in ED on 07/03/21. Patient deferring colonoscopy. Pharmacy has been consulted to initiate IV heparin therapy at this time. ? ?Goal of Therapy:  ?INR 2.5-3.5 ?Heparin level 0.3-0.7 units/ml ?Monitor platelets by anticoagulation protocol: Yes ?  ?Plan:  ?5/11:  HL @ 0051 = 0.31, therapeutic X 1 ?Will continue pt on current rate and recheck HL on 5/11 @ 0700.  ? ?Jontez Redfield D, PharmD ?Clinical Pharmacist   ?07/06/2021,2:23 AM ? ? ?

## 2021-07-06 NOTE — Progress Notes (Signed)
GI Inpatient Follow-up Note ? ?Subjective: ? ?Patient seen and doing well over all. Stools have firmed up but still some residual clots. Hemoglobin drifted slightly lower. He is endorsing some nausea. ? ?Scheduled Inpatient Medications:  ? acidophilus  1 capsule Oral Daily  ? amiodarone  200 mg Oral BID  ? ferrous sulfate  325 mg Oral Daily  ? insulin aspart  0-15 Units Subcutaneous Q4H  ? LORazepam  1 mg Oral BID  ? metoprolol succinate  50 mg Oral Daily  ? pioglitazone  45 mg Oral Daily  ? polyethylene glycol  17 g Oral Daily  ? cyanocobalamin  1,000 mcg Oral Daily  ? ? ?Continuous Inpatient Infusions: ?  ? sodium chloride Stopped (07/03/21 1900)  ? heparin 2,200 Units/hr (07/06/21 7371)  ? ? ?PRN Inpatient Medications:  ?acetaminophen **OR** acetaminophen, cyclobenzaprine, iohexol, morphine injection, ondansetron **OR** ondansetron (ZOFRAN) IV, traZODone ? ?Review of Systems: ? ?Review of Systems  ?Constitutional:  Negative for chills and fever.  ?Gastrointestinal:  Positive for nausea. Negative for abdominal pain, constipation, diarrhea and vomiting.  ?Musculoskeletal:  Positive for joint pain.  ?Skin:  Negative for itching and rash.  ?Neurological:  Negative for focal weakness.  ?Psychiatric/Behavioral:  Negative for substance abuse.    ?  ?Physical Examination: ?BP (!) 105/59 (BP Location: Right Arm)   Pulse 70   Temp 97.9 ?F (36.6 ?C)   Resp 18   Ht '5\' 10"'$  (1.778 m)   Wt (!) 240.4 kg   SpO2 100%   BMI 76.05 kg/m?  ?Gen: NAD, alert and oriented x 4 ?HEENT: PEERLA, EOMI, ?Neck: supple, no JVD or thyromegaly ?Chest: No respiratory distress ?Abd: soft, non-tender, non-distended but obese ?Ext: no edema, well perfused with 2+ pulses, ?Skin: no rash or lesions noted ?Lymph: no LAD ? ?Data: ?Lab Results  ?Component Value Date  ? WBC 6.7 07/06/2021  ? HGB 8.3 (L) 07/06/2021  ? HCT 26.4 (L) 07/06/2021  ? MCV 91.7 07/06/2021  ? PLT 129 (L) 07/06/2021  ? ?Recent Labs  ?Lab 07/04/21 ?1222 07/05/21 ?0451  07/06/21 ?0626  ?HGB 8.9* 8.9* 8.3*  ? ?Lab Results  ?Component Value Date  ? NA 135 07/04/2021  ? K 4.4 07/04/2021  ? CL 105 07/04/2021  ? CO2 25 07/04/2021  ? BUN 37 (H) 07/04/2021  ? CREATININE 0.89 07/04/2021  ? ?Lab Results  ?Component Value Date  ? ALT 25 07/03/2021  ? AST 23 07/03/2021  ? ALKPHOS 67 07/03/2021  ? BILITOT 1.0 07/03/2021  ? ?Recent Labs  ?Lab 07/03/21 ?1337 07/03/21 ?2226 07/05/21 ?0451  ?APTT 38*  --   --   ?INR 2.6*   < > 1.1  ? < > = values in this interval not displayed.  ? ?Assessment/Plan: ?Mr. Berkovich is a 51 y.o. gentleman with morbid obesity, mechanical aortic valve, a. Fib, hypertension, and DM II who is here with suspected recurrent diverticular bleeding. Discussed risks/benefits of proceeding with colonoscopy and patient has elected to defer colonoscopy given he had one 7 months ago showing just diverticulosis. His morbid obesity makes any procedure difficult and sedation difficult as well. He has not had any hemodynamically significant bleeding since Monday. His stools are firmed up now so I suspect diverticular bleeding has resolved. ? ?Recommendations: ? ?- recommend that we continue to monitor him through tomorrow given he needs to be on heparin drip and had slight drop in hemoglobin ?- daily cbc's ?- maintain active type and screen ?- transfuse if hemoglobin < 7 ?- bowel  regimen ?- monitor for any recurrent hemodynamically significant GI bleeding ?- consider stopping PO iron and give a dose of IV iron ?  ?Please call with any questions or concerns. Will continue to follow. ? ?Raylene Miyamoto MD, MPH ?[facility] ? ?  ?

## 2021-07-06 NOTE — Progress Notes (Signed)
?PROGRESS NOTE ? ? ? ?Russell Ray  PHX:[id number] DOB: [birth year] DOA: 07/03/2021 ?PCP: [name], MD  ? ? ?Brief Narrative:  ?Russell Ray is a 51 y.o. 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 ?5/11 CBC down mildly.  Patient denies any further bleed. ? ?Consultants:  ?GI ? ?Procedures:  ? ?Antimicrobials:  ?  ? ? ?Subjective: ?Denies abdominal pain, diarrhea or nausea ? ?Objective: ?Vitals:  ? 07/05/21 2346 07/06/21 0446 07/06/21 0815 07/06/21 1113  ?BP: 109/61 (!) 115/51 (!) 123/54 (!) 105/59  ?Pulse: (!) 110 80 72 70  ?Resp: '17 16 19 18  '$ ?Temp: 98.3 ?F (36.8 ?C) 98.2 ?F (36.8 ?C) 98 ?F (36.7 ?C) 97.9 ?F (36.6 ?C)  ?TempSrc: Oral     ?SpO2: 96% 96% 99% 100%  ?Weight:      ?Height:      ? ? ?Intake/Output Summary (Last 24 hours) at 07/06/2021 1450 ?Last data filed at 07/06/2021 1346 ?Gross per 24 hour  ?Intake 2401.31 ml  ?Output --  ?Net 2401.31 ml  ? ?Filed Weights  ? 07/03/21 1335  ?Weight: (!) 240.4 kg  ? ? ?Examination: ?Calm, NAD ?Cta no w/r ?Reg s1/s2 no gallop ?Soft benign +bs ?No edema ?Aaoxox3  ?Mood and affect appropriate in current setting ? ? ?Data Reviewed: I have personally reviewed following labs and imaging studies ? ?CBC: ?Recent Labs  ?Lab 07/03/21 ?1336 07/03/21 ?1820 07/03/21 ?2226 07/04/21 ?0415 07/04/21 ?1222 07/05/21 ?0451 07/06/21 ?0165  ?WBC 8.5  --   --  8.9  --  6.7 6.7  ?HGB 12.3*   < > 10.2* 9.0* 8.9* 8.9* 8.3*  ?HCT 40.3   < > 33.2* 29.2* 28.7* 28.1* 26.4*  ?MCV 91.4  --   --  91.0  --  91.2 91.7  ?PLT 205  --   --  151  --  130* 129*  ? < > = values in this interval not displayed.  ? ?Basic Metabolic Panel: ?Recent Labs  ?Lab 07/03/21 ?1336 07/04/21 ?0415  ?NA 133* 135  ?K 4.7 4.4  ?CL 101 105  ?CO2 27 25  ?GLUCOSE 119* 117*  ?BUN  30* 37*  ?CREATININE 0.85 0.89  ?CALCIUM 10.4* 9.2  ? ?GFR: ?Estimated Creatinine Clearance: 198.8 mL/min (by C-G formula based on SCr of 0.89 mg/dL). ?Liver Function Tests: ?Recent Labs  ?Lab 07/03/21 ?1336  ?AST 23  ?ALT 25  ?ALKPHOS 67  ?BILITOT 1.0  ?PROT 7.0  ?ALBUMIN 4.0  ? ?No results for input(s): LIPASE, AMYLASE in the last 168 hours. ?No results for input(s): AMMONIA in the last 168 hours. ?Coagulation Profile: ?Recent Labs  ?Lab 07/03/21 ?1337 07/03/21 ?2226 07/04/21 ?5374 07/05/21 ?0451  ?INR 2.6* 1.4* 1.4* 1.1  ? ?Cardiac Enzymes: ?No results for input(s): CKTOTAL, CKMB, CKMBINDEX, TROPONINI in the last 168 hours. ?BNP (last 3 results) ?No results for input(s): PROBNP in the last 8760 hours. ?HbA1C: ?Recent Labs  ?  07/04/21 ?1222  ?HGBA1C 6.2*  ? ?CBG: ?Recent Labs  ?Lab 07/05/21 ?1949 07/05/21 ?2301 07/06/21 ?0400 07/06/21 ?8270 07/06/21 ?1109  ?GLUCAP 117* 114* 99 101* 117*  ? ?Lipid Profile: ?No results for input(s): CHOL, HDL, LDLCALC, TRIG, CHOLHDL, LDLDIRECT in the last 72 hours. ?Thyroid Function Tests: ?No results for input(s): TSH, T4TOTAL, FREET4, T3FREE, THYROIDAB in the  last 72 hours. ?Anemia Panel: ?No results for input(s): VITAMINB12, FOLATE, FERRITIN, TIBC, IRON, RETICCTPCT in the last 72 hours. ?Sepsis Labs: ?No results for input(s): PROCALCITON, LATICACIDVEN in the last 168 hours. ? ?No results found for this or any previous visit (from the past 240 hour(s)).  ? ? ? ? ? ?Radiology Studies: ?NM GI Blood Loss ? ?Result Date: 07/04/2021 ?CLINICAL DATA:  GI bleed EXAM: NUCLEAR MEDICINE GASTROINTESTINAL BLEEDING SCAN TECHNIQUE: Sequential abdominal images were obtained following intravenous administration of Tc-9mpertechnetate labeled red blood cells. RADIOPHARMACEUTICALS:  26.70 mCi Tc-958mertechnetate in-vitro labeled red cells. COMPARISON:  10/19/2020 Correlation: CT abdomen and pelvis 12/01/2016 FINDINGS: Mild abnormal tracer localization is identified and curvilinear orientation in  LEFT lower quadrant superolateral to the urinary bladder. This corresponds with the course of the sigmoid colon on prior CT. Findings are consistent with a small volume GI bleed of the sigmoid colon. Otherwise expected normal blood pool of tracer. IMPRESSION: Small volume GI bleed located at the sigmoid colon. Electronically Signed   By: MaLavonia Dana.D.   On: 07/04/2021 16:30   ? ? ? ? ? ?Scheduled Meds: ? acidophilus  1 capsule Oral Daily  ? amiodarone  200 mg Oral BID  ? ferrous sulfate  325 mg Oral Daily  ? insulin aspart  0-15 Units Subcutaneous Q4H  ? LORazepam  1 mg Oral BID  ? metoprolol succinate  50 mg Oral Daily  ? pioglitazone  45 mg Oral Daily  ? polyethylene glycol  17 g Oral Daily  ? cyanocobalamin  1,000 mcg Oral Daily  ? ?Continuous Infusions: ? sodium chloride Stopped (07/03/21 1900)  ? heparin 2,200 Units/hr (07/06/21 074287 ? ? ?Assessment & Plan: ?  ?Principal Problem: ?  GI bleeding ?Active Problems: ?  Hypotension ?  Paroxysmal atrial fibrillation (HCC) ?  Type 2 diabetes mellitus without complication (HCC) ?  Status post aortic valve replacement ?  Dyslipidemia ?  Essential hypertension ?  Morbid obesity ([place]? ? 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 Bleed ?5/11 spoke to GI Dr. LoHaig Prophetho recommended continuing heparin starting Coumadin tomorrow  ?Dc po iron, give iv iron ? ? ?  ?Paroxysmal atrial fibrillation (HCC) ?On amiodarone metoprolol  ?5/11 anticoagulation with IV heparin and possibly resume Coumadin in a.m. ? ? ?  ?Status post aortic valve replacement ?- He has a prosthetic valve. ?5/11 on heparin drip as above  ? ? ?  ?Type 2 diabetes mellitus without complication (HCC) ?Home Actos was continued on admission. ?5/10 refused FS checks. Spoke to him importance of checking his BG here, but c/o  too many sticks and bruising and that at home his sugars are stable. ?5/11 BG stable ?  ?Dyslipidemia ?- Continue statin therapy. ?  ?Essential hypertension ?Currently blood pressure on softer side. ?-Keep holding home lisinopril ?-Continuing home metoprolol ?  ?Morbid obesity ([place]?Estimated body mass index is 76.05 kg/m? as calculated from the following: ?  Height as of this encounter: '5\' 10"'$  (1.778 m). ?  Weight as of this encounter: 240.4 kg.  ? ? ?DVT prophylaxis: Heparin drip ?Code Status: Full ?Family Communication: None at bedside ?Disposition Plan: Return back home in 1 to 2 days ?Status is: Inpatient ?Remains inpatient appropriate because: IV treatment, patient with GI bleed ?  ? ? ? ? ? LOS: 3  days  ? ?Time spent: 35 minutes ? ? ? ?Nolberto Hanlon, MD ?[facility] ?Pager [phone] ? ?If 7PM-7AM, please contact night-coverage ?07/06/2021, 2:50 PM   ?

## 2021-07-07 ENCOUNTER — Other Ambulatory Visit: Payer: Self-pay

## 2021-07-07 ENCOUNTER — Other Ambulatory Visit (HOSPITAL_COMMUNITY): Payer: Self-pay

## 2021-07-07 DIAGNOSIS — E785 Hyperlipidemia, unspecified: Secondary | ICD-10-CM | POA: Diagnosis not present

## 2021-07-07 DIAGNOSIS — I10 Essential (primary) hypertension: Secondary | ICD-10-CM | POA: Diagnosis not present

## 2021-07-07 DIAGNOSIS — K922 Gastrointestinal hemorrhage, unspecified: Secondary | ICD-10-CM | POA: Diagnosis not present

## 2021-07-07 DIAGNOSIS — I959 Hypotension, unspecified: Secondary | ICD-10-CM | POA: Diagnosis not present

## 2021-07-07 LAB — GLUCOSE, CAPILLARY
Glucose-Capillary: 128 mg/dL — ABNORMAL HIGH (ref 70–99)
Glucose-Capillary: 106 mg/dL — ABNORMAL HIGH (ref 70–99)

## 2021-07-07 LAB — CBC
HCT: 25.9 % — ABNORMAL LOW (ref 39.0–52.0)
Hemoglobin: 8.2 g/dL — ABNORMAL LOW (ref 13.0–17.0)
MCH: 28.7 pg (ref 26.0–34.0)
MCHC: 31.7 g/dL (ref 30.0–36.0)
MCV: 90.6 fL (ref 80.0–100.0)
Platelets: 156 10*3/uL (ref 150–400)
RBC: 2.86 MIL/uL — ABNORMAL LOW (ref 4.22–5.81)
RDW: 17 % — ABNORMAL HIGH (ref 11.5–15.5)
WBC: 6.9 10*3/uL (ref 4.0–10.5)
nRBC: 1.5 % — ABNORMAL HIGH (ref 0.0–0.2)

## 2021-07-07 LAB — HEPARIN LEVEL (UNFRACTIONATED): Heparin Unfractionated: 0.24 IU/mL — ABNORMAL LOW (ref 0.30–0.70)

## 2021-07-07 MED ORDER — ENOXAPARIN SODIUM 300 MG/3ML IJ SOLN
1.0000 mg/kg | Freq: Once | INTRAMUSCULAR | Status: AC
  Administered 2021-07-07: 240 mg via SUBCUTANEOUS
  Filled 2021-07-07: qty 2.4

## 2021-07-07 MED ORDER — HEPARIN BOLUS VIA INFUSION
2000.0000 [IU] | Freq: Once | INTRAVENOUS | Status: AC
  Administered 2021-07-07: 2000 [IU] via INTRAVENOUS
  Filled 2021-07-07: qty 2000

## 2021-07-07 MED ORDER — ENOXAPARIN SODIUM 300 MG/3ML IJ SOLN: 240.0000 mg | Freq: Two times a day (BID) | INTRAMUSCULAR | 0 refills | Status: DC

## 2021-07-07 MED ORDER — ENOXAPARIN SODIUM 120 MG/0.8ML IJ SOSY
240.0000 mg | PREFILLED_SYRINGE | Freq: Two times a day (BID) | INTRAMUSCULAR | Status: DC
  Filled 2021-07-07: qty 1.6

## 2021-07-07 MED ORDER — ENOXAPARIN SODIUM 120 MG/0.8ML IJ SOSY
1.0000 mg/kg | PREFILLED_SYRINGE | Freq: Two times a day (BID) | INTRAMUSCULAR | 0 refills | Status: DC
  Filled 2021-07-07: qty 4.8, 3d supply, fill #0
  Filled 2021-07-07: qty 32, 10d supply, fill #0

## 2021-07-07 NOTE — Consult Note (Signed)
ANTICOAGULATION CONSULT NOTE ? ?Pharmacy Consult for heparin infusion ?Indication: prosthetic aortic valve, afib- warfarin PTA ? ?No Known Allergies ? ?Patient Measurements: ?Height: '5\' 10"'$  (177.8 cm) ?Weight: (!) 240.4 kg (530 lb) ?IBW/kg (Calculated) : 73 ?Heparin Dosing Weight: 136 kg ? ?Vital Signs: ?Temp: 97.4 ?F (36.3 ?C) (05/12 7681) ?Temp Source: Oral (05/12 0336) ?BP: 144/61 (05/12 0336) ?Pulse Rate: 113 (05/12 0336) ? ?Labs: ?Recent Labs  ?  07/05/21 ?0451 07/05/21 ?1636 07/06/21 ?1572 07/06/21 ?6203 07/07/21 ?0441  ?HGB 8.9*  --   --  8.3* 8.2*  ?HCT 28.1*  --   --  26.4* 25.9*  ?PLT 130*  --   --  129* 156  ?LABPROT 14.2  --   --   --   --   ?INR 1.1  --   --   --   --   ?HEPARINUNFRC  --    < > 0.31 0.30 0.24*  ? < > = values in this interval not displayed.  ? ? ? ?Estimated Creatinine Clearance: 198.8 mL/min (by C-G formula based on SCr of 0.89 mg/dL). ? ? ?Medical History: ?Past Medical History:  ?Diagnosis Date  ? Anxiety   ? Aortic valve stenosis   ? Atrial fibrillation ([place])   ? CHF (congestive heart failure) ([place])   ? Depression   ? Diabetes mellitus without complication ([place])   ? Hypertension   ? ? ?Medications:  ?PTA warfarin regimen: ?Warfarin 4 mg Mondays, Thursdays; Warfarin 3 mg all other days  ?TWD: 23 mg ?Vitamin K 10 mg IV x 1 : 5/8 @ 1930 ?Kcentra: 5/8 @ 2000 ? ?Assessment: ?51 year old male presented to [facility] 07/03/21 with acute onset of bright red bleeding per rectum suspected 2/2 diverticular bleeding. 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 INR 2.6 (therapeutic) on admission. Warfarin reversed with Kcentra and Vitamin K in ED on 07/03/21. Patient deferring colonoscopy. Pharmacy has been consulted to initiate IV heparin therapy at this time. ? ?5/12 0441 HL 0.24  ? ?Goal of Therapy:  ?INR 2.5-3.5 ?Heparin level 0.3-0.7 units/ml ?Monitor platelets by anticoagulation protocol: Yes ?  ?Plan:  ?Heparin level is subtherapeutic. Will give a heparin bolus  of 2000 units and increase heparin infusion to 2400 units/hr. Recheck heparin level in 6 hours. CBC daily while on heparin.  ? ?Oswald Hillock, PharmD ?Clinical Pharmacist   ?07/07/2021,5:27 AM ? ? ?

## 2021-07-07 NOTE — Discharge Summary (Signed)
Russell Ray EXN:[id number] DOB: [birth year] DOA: 07/03/2021 ? ?PCP: [name], MD ? ?Admit date: 07/03/2021 ?Discharge date: 07/07/2021 ? ?Admitted From: Home ?Disposition: Home ? ?Recommendations for Outpatient Follow-up:  ?Follow up with PCP in 1 week ?Please obtain BMP/CBC in one week ? ? ?  ? ? ?Discharge Condition:Stable ?CODE STATUS: Full ?Diet recommendation: Heart Healthy / Carb Modified  ?Brief/Interim Summary: ?Per HPI:Russell Ray is a 51 y.o. 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  With IV hydration BP was up to 125/61 then 110/55 with a heart rate of 94 and later 79.   Labs revealed mild hyponatremia 133 with a BUN of 30 with a calcium of 10.4 with otherwise unremarkable CMP.  CBC showed a globin of 12.3 and hematocrit of 40.3 with repeat levels of 10/32.8 ?5/11 in Foul mood, aggressive and argumentive about almost everything. Nurse unit director in room during discussion. ? ?GI bleeding ?Hypotension ?NM tagged RBC scan ordered.-Patient was unable to fit in our scanner for CT abdomen ?GI was consulted ?Suspect recurrent diverticular bleeding ?Cscope deferred by pt ?Was given IV iron ?Follow-up GI as outpatient ?H&H remained stable , bleeding resolved ?  ?  ?  ?Paroxysmal atrial fibrillation (HCC) ?On amiodarone metoprolol  ?Was on IV heparin initially ?Coumadin and Lovenox ?  ?  ?Status post aortic valve replacement ?- He has a prosthetic valve. ?Started on IV heparin drip, H&H remained stable ?Spoke to his cardiologist Dr. Humphrey Rolls via chat was okay with patient being discharged on Coumadin and  Lovenox with goal INR as previously 2.5 ?Follow-up with cardiology as outpatient ?  ?  ?  ?Type 2 diabetes mellitus without complication (HCC) ?During his hospitalization initially refused to have his fingersticks checked ?Continue home meds ?  ?Dyslipidemia ?- Continue statin therapy. ?  ?Essential hypertension ?Continue home meds ?  ?Morbid obesity ([place]) ?Estimated body mass index is 76.05 kg/m? as calculated from the following: ?  Height as of this encounter: '5\' 10"'$  (1.778 m). ?  Weight as of this encounter: 240.4 kg.  ?  ?Discharge Diagnoses:  ?Principal Problem: ?  GI bleeding ?Active Problems: ?  Hypotension ?  Paroxysmal atrial fibrillation (HCC) ?  Type 2 diabetes mellitus without complication (HCC) ?  Status post aortic valve replacement ?  Dyslipidemia ?  Essential hypertension ?  Morbid obesity ([place]) ? ? ? ?Discharge Instructions ? ?Discharge Instructions   ? ? Call MD for:  difficulty breathing, headache or visual disturbances   Complete by: As directed ?  ? Diet - low sodium heart healthy   Complete by: As directed ?  ? Diet Carb Modified   Complete by: As directed ?  ? Increase activity slowly   Complete by: As directed ?  ? ?  ? ?Allergies as of 07/07/2021   ?No Known Allergies ?  ? ?  ?Medication List  ?  ? ?STOP taking these medications   ? ?celecoxib 400 MG capsule ?Commonly known as: CELEBREX ?  ?cyclobenzaprine 5 MG tablet ?Commonly known as: FLEXERIL ?  ?docusate sodium 100 MG capsule ?Commonly known as: COLACE ?  ?  lisinopril 20 MG tablet ?Commonly known as: ZESTRIL ?  ?ondansetron 4 MG tablet ?Commonly known as: ZOFRAN ?  ?traMADol-acetaminophen 37.5-325 MG tablet ?Commonly known as: ULTRACET ?  ? ?  ? ?TAKE these medications   ? ?acetaminophen 325 MG tablet ?Commonly known as: TYLENOL ?Take 650 mg by mouth every 4 (four) hours as needed for moderate pain. ?  ?amiodarone 200 MG tablet ?Commonly known as: PACERONE ?2 tablets twice a day for 5 days then 1 tablet twice a day for 5 days then one  tablet daily afterwards ?What changed:  ?how much to take ?how to take this ?when to take this ?additional instructions ?  ?cyanocobalamin 1000 MCG tablet ?Take 1,000 mcg by mouth daily. ?  ?enoxaparin 120 MG/0.8ML injection ?Commonly known as: LOVENOX ?Inject 1.6 mLs (240 mg total) into the skin 2 (two) times daily for 10 days. ?  ?Iron (Ferrous Sulfate) 142 (45 Fe) MG Tbcr ?Take 1 tablet by mouth daily. ?  ?LORazepam 1 MG tablet ?Commonly known as: ATIVAN ?Take 1 mg by mouth 2 (two) times daily. ?  ?magnesium oxide 400 (240 Mg) MG tablet ?Commonly known as: MAG-OX ?Take 1 tablet (400 mg total) by mouth every morning. ?What changed:  ?how much to take ?when to take this ?  ?metoprolol succinate 50 MG 24 hr tablet ?Commonly known as: TOPROL-XL ?Take 50 mg by mouth daily. Take with or immediately following a meal. ?  ?pantoprazole 40 MG tablet ?Commonly known as: PROTONIX ?Take 1 tablet (40 mg total) by mouth daily. ?  ?pioglitazone 45 MG tablet ?Commonly known as: ACTOS ?Take 45 mg by mouth daily. ?  ?polyethylene glycol 17 g packet ?Commonly known as: MIRALAX / GLYCOLAX ?Take 17 g by mouth daily. ?  ?rosuvastatin 20 MG tablet ?Commonly known as: CRESTOR ?SMARTSIG:1 Tablet(s) By Mouth Every Evening ?  ?warfarin 3 MG tablet ?Commonly known as: COUMADIN ?Take 3 mg by mouth as directed. Takes 3 mg all days except Mondays and Thursdays ?  ?warfarin 2 MG tablet ?Commonly known as: COUMADIN ?Take 4 mg by mouth as directed. Takes 4 mg on mondays and thursdays ?  ? ?  ? ? Follow-up Information   ? ? Neoma Laming A, MD Follow up in 2 week(s).   ?Specialty: Cardiology ?Contact information: ?[address] ?[place] [address] ?[phone] ? ? ?  ?  ? ? Lesly Rubenstein, MD Follow up in 1 week(s).   ?Specialty: Gastroenterology ?Contact information: ?[place] [address] ?[phone] ? ? ?  ?  ? ? [name], MD Follow up in 1 week(s).   ?Specialty: Internal Medicine ?Contact  information: ?[address] ?[place] [address] ?[phone] ? ? ?  ?  ? ?  ?  ? ?  ? ?No Known Allergies ? ?Consultations: ?GI ? ? ?Procedures/Studies: ?NM GI Blood Loss ? ?Result Date: 07/04/2021 ?CLINICAL DATA:  GI bleed EXAM: NUCLEAR MEDICINE GASTROINTESTINAL BLEEDING SCAN TECHNIQUE: Sequential abdominal images were obtained following intravenous administration of Tc-81mpertechnetate labeled red blood cells. RADIOPHARMACEUTICALS:  26.70 mCi Tc-954mertechnetate in-vitro labeled red cells. COMPARISON:  10/19/2020 Correlation: CT abdomen and pelvis 12/01/2016 FINDINGS: Mild abnormal tracer localization is identified and curvilinear orientation in LEFT lower quadrant superolateral to the urinary bladder. This corresponds with the course of the sigmoid colon on prior CT. Findings are consistent with a small volume GI bleed of the sigmoid colon. Otherwise expected normal blood pool of tracer. IMPRESSION: Small volume GI bleed located at the  sigmoid colon. Electronically Signed   By: Lavonia Dana M.D.   On: 07/04/2021 16:30   ? ? ? ?Subjective: ?No shortness of breath or chest pain no bright red blood ? ?Discharge Exam: ?Vitals:  ? 07/07/21 0745 07/07/21 1122  ?BP: 123/69 119/70  ?Pulse: (!) 104 (!) 103  ?Resp: 18 19  ?Temp: 98 ?F (36.7 ?C) 98 ?F (36.7 ?C)  ?SpO2: 98% 90%  ? ?Vitals:  ? 07/06/21 2322 07/07/21 0336 07/07/21 0745 07/07/21 1122  ?BP: (!) 104/46 (!) 144/61 123/69 119/70  ?Pulse: 84 (!) 113 (!) 104 (!) 103  ?Resp: '16 18 18 19  '$ ?Temp: 97.7 ?F (36.5 ?C) (!) 97.4 ?F (36.3 ?C) 98 ?F (36.7 ?C) 98 ?F (36.7 ?C)  ?TempSrc:  Oral    ?SpO2: 98% 95% 98% 90%  ?Weight:      ?Height:      ? ? ?General: Pt is alert, awake, not in acute distress ?Cardiovascular: RRR, S1/S2 +, no rubs, no gallops ?Respiratory: CTA bilaterally, no wheezing, no rhonchi ?Abdominal: Soft, NT, ND, bowel sounds + ?Extremities: no edema, no cyanosis ? ? ? ?The results of significant diagnostics from this hospitalization (including imaging,  microbiology, ancillary and laboratory) are listed below for reference.   ? ? ?Microbiology: ?No results found for this or any previous visit (from the past 240 hour(s)).  ? ?Labs: ?BNP (last 3 results) ?Recent Labs  ?  08/2

## 2021-07-07 NOTE — Progress Notes (Signed)
Patient given discharge and med teaching. No concerns voiced at this time. Patient IV and tele d/c. IV site is clear dry and intact and free or any redness and swelling. Patient escorted to his transport via wheelchair. ?

## 2021-07-27 DIAGNOSIS — H579 Unspecified disorder of eye and adnexa: Secondary | ICD-10-CM | POA: Diagnosis not present

## 2021-07-27 DIAGNOSIS — Z952 Presence of prosthetic heart valve: Secondary | ICD-10-CM | POA: Diagnosis not present

## 2021-07-27 DIAGNOSIS — H3509 Other intraretinal microvascular abnormalities: Secondary | ICD-10-CM | POA: Diagnosis not present

## 2021-07-27 DIAGNOSIS — H539 Unspecified visual disturbance: Secondary | ICD-10-CM | POA: Diagnosis not present

## 2021-07-27 DIAGNOSIS — Z87891 Personal history of nicotine dependence: Secondary | ICD-10-CM | POA: Diagnosis not present

## 2021-07-27 DIAGNOSIS — E119 Type 2 diabetes mellitus without complications: Secondary | ICD-10-CM | POA: Diagnosis not present

## 2021-07-27 DIAGNOSIS — I708 Atherosclerosis of other arteries: Secondary | ICD-10-CM | POA: Diagnosis not present

## 2021-07-27 DIAGNOSIS — I110 Hypertensive heart disease with heart failure: Secondary | ICD-10-CM | POA: Diagnosis not present

## 2021-07-27 DIAGNOSIS — H34231 Retinal artery branch occlusion, right eye: Secondary | ICD-10-CM | POA: Diagnosis not present

## 2021-07-27 DIAGNOSIS — Z8719 Personal history of other diseases of the digestive system: Secondary | ICD-10-CM | POA: Diagnosis not present

## 2021-07-27 DIAGNOSIS — Z7901 Long term (current) use of anticoagulants: Secondary | ICD-10-CM | POA: Diagnosis not present

## 2021-07-27 DIAGNOSIS — I509 Heart failure, unspecified: Secondary | ICD-10-CM | POA: Diagnosis not present

## 2021-08-04 DIAGNOSIS — H3509 Other intraretinal microvascular abnormalities: Secondary | ICD-10-CM | POA: Diagnosis not present

## 2021-08-04 DIAGNOSIS — H34231 Retinal artery branch occlusion, right eye: Secondary | ICD-10-CM | POA: Diagnosis not present

## 2021-08-04 DIAGNOSIS — I708 Atherosclerosis of other arteries: Secondary | ICD-10-CM | POA: Diagnosis not present

## 2021-08-08 DIAGNOSIS — H34231 Retinal artery branch occlusion, right eye: Secondary | ICD-10-CM | POA: Diagnosis not present

## 2021-08-08 DIAGNOSIS — Z954 Presence of other heart-valve replacement: Secondary | ICD-10-CM | POA: Diagnosis not present

## 2021-08-30 DIAGNOSIS — E668 Other obesity: Secondary | ICD-10-CM | POA: Diagnosis not present

## 2021-08-30 DIAGNOSIS — I6389 Other cerebral infarction: Secondary | ICD-10-CM | POA: Diagnosis not present

## 2021-08-30 DIAGNOSIS — R69 Illness, unspecified: Secondary | ICD-10-CM | POA: Diagnosis not present

## 2021-08-30 DIAGNOSIS — J209 Acute bronchitis, unspecified: Secondary | ICD-10-CM | POA: Diagnosis not present

## 2021-08-30 DIAGNOSIS — I10 Essential (primary) hypertension: Secondary | ICD-10-CM | POA: Diagnosis not present

## 2021-08-30 DIAGNOSIS — D509 Iron deficiency anemia, unspecified: Secondary | ICD-10-CM | POA: Diagnosis not present

## 2021-08-30 DIAGNOSIS — K439 Ventral hernia without obstruction or gangrene: Secondary | ICD-10-CM | POA: Diagnosis not present

## 2021-08-30 DIAGNOSIS — M25569 Pain in unspecified knee: Secondary | ICD-10-CM | POA: Diagnosis not present

## 2021-08-30 DIAGNOSIS — E119 Type 2 diabetes mellitus without complications: Secondary | ICD-10-CM | POA: Diagnosis not present

## 2021-08-30 DIAGNOSIS — E785 Hyperlipidemia, unspecified: Secondary | ICD-10-CM | POA: Diagnosis not present

## 2021-09-15 DIAGNOSIS — I708 Atherosclerosis of other arteries: Secondary | ICD-10-CM | POA: Diagnosis not present

## 2021-09-15 DIAGNOSIS — H34231 Retinal artery branch occlusion, right eye: Secondary | ICD-10-CM | POA: Diagnosis not present

## 2021-09-15 DIAGNOSIS — H3509 Other intraretinal microvascular abnormalities: Secondary | ICD-10-CM | POA: Diagnosis not present

## 2021-09-27 DIAGNOSIS — R1013 Epigastric pain: Secondary | ICD-10-CM | POA: Diagnosis not present

## 2021-09-27 DIAGNOSIS — K429 Umbilical hernia without obstruction or gangrene: Secondary | ICD-10-CM | POA: Diagnosis not present

## 2021-09-27 DIAGNOSIS — K439 Ventral hernia without obstruction or gangrene: Secondary | ICD-10-CM | POA: Diagnosis not present

## 2021-09-27 DIAGNOSIS — K436 Other and unspecified ventral hernia with obstruction, without gangrene: Secondary | ICD-10-CM | POA: Diagnosis not present

## 2021-10-12 DIAGNOSIS — Z7901 Long term (current) use of anticoagulants: Secondary | ICD-10-CM | POA: Diagnosis not present

## 2021-10-12 DIAGNOSIS — Z954 Presence of other heart-valve replacement: Secondary | ICD-10-CM | POA: Diagnosis not present

## 2021-10-12 DIAGNOSIS — I509 Heart failure, unspecified: Secondary | ICD-10-CM | POA: Diagnosis not present

## 2021-10-12 DIAGNOSIS — Z720 Tobacco use: Secondary | ICD-10-CM | POA: Diagnosis not present

## 2021-10-12 DIAGNOSIS — E119 Type 2 diabetes mellitus without complications: Secondary | ICD-10-CM | POA: Diagnosis not present

## 2021-11-02 DIAGNOSIS — F419 Anxiety disorder, unspecified: Secondary | ICD-10-CM | POA: Diagnosis not present

## 2021-11-02 DIAGNOSIS — F4321 Adjustment disorder with depressed mood: Secondary | ICD-10-CM | POA: Diagnosis not present

## 2021-11-02 DIAGNOSIS — Z01818 Encounter for other preprocedural examination: Secondary | ICD-10-CM | POA: Diagnosis not present

## 2021-11-02 DIAGNOSIS — R69 Illness, unspecified: Secondary | ICD-10-CM | POA: Diagnosis not present

## 2021-11-02 DIAGNOSIS — Z87891 Personal history of nicotine dependence: Secondary | ICD-10-CM | POA: Diagnosis not present

## 2021-12-14 DIAGNOSIS — D508 Other iron deficiency anemias: Secondary | ICD-10-CM | POA: Diagnosis not present

## 2021-12-14 DIAGNOSIS — I5032 Chronic diastolic (congestive) heart failure: Secondary | ICD-10-CM | POA: Diagnosis not present

## 2021-12-14 DIAGNOSIS — Z954 Presence of other heart-valve replacement: Secondary | ICD-10-CM | POA: Diagnosis not present

## 2021-12-14 DIAGNOSIS — Z79899 Other long term (current) drug therapy: Secondary | ICD-10-CM | POA: Diagnosis not present

## 2021-12-14 DIAGNOSIS — Z952 Presence of prosthetic heart valve: Secondary | ICD-10-CM | POA: Diagnosis not present

## 2021-12-14 DIAGNOSIS — I10 Essential (primary) hypertension: Secondary | ICD-10-CM | POA: Diagnosis not present

## 2021-12-14 DIAGNOSIS — E119 Type 2 diabetes mellitus without complications: Secondary | ICD-10-CM | POA: Diagnosis not present

## 2021-12-14 DIAGNOSIS — Z6841 Body Mass Index (BMI) 40.0 and over, adult: Secondary | ICD-10-CM | POA: Diagnosis not present

## 2021-12-14 DIAGNOSIS — Z9884 Bariatric surgery status: Secondary | ICD-10-CM | POA: Diagnosis not present

## 2021-12-14 DIAGNOSIS — I509 Heart failure, unspecified: Secondary | ICD-10-CM | POA: Diagnosis not present

## 2021-12-14 DIAGNOSIS — K436 Other and unspecified ventral hernia with obstruction, without gangrene: Secondary | ICD-10-CM | POA: Diagnosis not present

## 2021-12-14 DIAGNOSIS — G4733 Obstructive sleep apnea (adult) (pediatric): Secondary | ICD-10-CM | POA: Diagnosis not present

## 2021-12-21 DIAGNOSIS — I708 Atherosclerosis of other arteries: Secondary | ICD-10-CM | POA: Diagnosis not present

## 2021-12-21 DIAGNOSIS — H3509 Other intraretinal microvascular abnormalities: Secondary | ICD-10-CM | POA: Diagnosis not present

## 2021-12-21 DIAGNOSIS — H34231 Retinal artery branch occlusion, right eye: Secondary | ICD-10-CM | POA: Diagnosis not present

## 2022-01-25 DIAGNOSIS — I110 Hypertensive heart disease with heart failure: Secondary | ICD-10-CM | POA: Diagnosis not present

## 2022-01-25 DIAGNOSIS — Z7901 Long term (current) use of anticoagulants: Secondary | ICD-10-CM | POA: Diagnosis not present

## 2022-01-25 DIAGNOSIS — I10 Essential (primary) hypertension: Secondary | ICD-10-CM | POA: Diagnosis not present

## 2022-01-25 DIAGNOSIS — E119 Type 2 diabetes mellitus without complications: Secondary | ICD-10-CM | POA: Diagnosis not present

## 2022-01-25 DIAGNOSIS — I4510 Unspecified right bundle-branch block: Secondary | ICD-10-CM | POA: Diagnosis not present

## 2022-01-25 DIAGNOSIS — H34231 Retinal artery branch occlusion, right eye: Secondary | ICD-10-CM | POA: Diagnosis not present

## 2022-01-25 DIAGNOSIS — E559 Vitamin D deficiency, unspecified: Secondary | ICD-10-CM | POA: Diagnosis not present

## 2022-01-25 DIAGNOSIS — I482 Chronic atrial fibrillation, unspecified: Secondary | ICD-10-CM | POA: Diagnosis not present

## 2022-01-25 DIAGNOSIS — I9789 Other postprocedural complications and disorders of the circulatory system, not elsewhere classified: Secondary | ICD-10-CM | POA: Diagnosis not present

## 2022-01-25 DIAGNOSIS — Z01818 Encounter for other preprocedural examination: Secondary | ICD-10-CM | POA: Diagnosis not present

## 2022-01-25 DIAGNOSIS — Z6841 Body Mass Index (BMI) 40.0 and over, adult: Secondary | ICD-10-CM | POA: Diagnosis not present

## 2022-01-25 DIAGNOSIS — R69 Illness, unspecified: Secondary | ICD-10-CM | POA: Diagnosis not present

## 2022-01-25 DIAGNOSIS — R9431 Abnormal electrocardiogram [ECG] [EKG]: Secondary | ICD-10-CM | POA: Diagnosis not present

## 2022-01-25 DIAGNOSIS — I4430 Unspecified atrioventricular block: Secondary | ICD-10-CM | POA: Diagnosis not present

## 2022-01-25 DIAGNOSIS — Z954 Presence of other heart-valve replacement: Secondary | ICD-10-CM | POA: Diagnosis not present

## 2022-01-25 DIAGNOSIS — D508 Other iron deficiency anemias: Secondary | ICD-10-CM | POA: Diagnosis not present

## 2022-01-25 DIAGNOSIS — I5032 Chronic diastolic (congestive) heart failure: Secondary | ICD-10-CM | POA: Diagnosis not present

## 2022-01-25 DIAGNOSIS — Z87891 Personal history of nicotine dependence: Secondary | ICD-10-CM | POA: Diagnosis not present

## 2022-01-25 DIAGNOSIS — Z952 Presence of prosthetic heart valve: Secondary | ICD-10-CM | POA: Diagnosis not present

## 2022-01-25 DIAGNOSIS — G4733 Obstructive sleep apnea (adult) (pediatric): Secondary | ICD-10-CM | POA: Diagnosis not present

## 2022-02-01 DIAGNOSIS — I351 Nonrheumatic aortic (valve) insufficiency: Secondary | ICD-10-CM | POA: Diagnosis not present

## 2022-02-01 DIAGNOSIS — I2510 Atherosclerotic heart disease of native coronary artery without angina pectoris: Secondary | ICD-10-CM | POA: Diagnosis not present

## 2022-02-01 DIAGNOSIS — E668 Other obesity: Secondary | ICD-10-CM | POA: Diagnosis not present

## 2022-02-01 DIAGNOSIS — E119 Type 2 diabetes mellitus without complications: Secondary | ICD-10-CM | POA: Diagnosis not present

## 2022-02-01 DIAGNOSIS — E669 Obesity, unspecified: Secondary | ICD-10-CM | POA: Diagnosis not present

## 2022-02-01 DIAGNOSIS — I10 Essential (primary) hypertension: Secondary | ICD-10-CM | POA: Diagnosis not present

## 2022-02-01 DIAGNOSIS — E785 Hyperlipidemia, unspecified: Secondary | ICD-10-CM | POA: Diagnosis not present

## 2022-02-05 DIAGNOSIS — I6389 Other cerebral infarction: Secondary | ICD-10-CM | POA: Diagnosis not present

## 2022-02-05 DIAGNOSIS — E119 Type 2 diabetes mellitus without complications: Secondary | ICD-10-CM | POA: Diagnosis not present

## 2022-02-05 DIAGNOSIS — K439 Ventral hernia without obstruction or gangrene: Secondary | ICD-10-CM | POA: Diagnosis not present

## 2022-02-05 DIAGNOSIS — M25569 Pain in unspecified knee: Secondary | ICD-10-CM | POA: Diagnosis not present

## 2022-02-05 DIAGNOSIS — I10 Essential (primary) hypertension: Secondary | ICD-10-CM | POA: Diagnosis not present

## 2022-02-05 DIAGNOSIS — E785 Hyperlipidemia, unspecified: Secondary | ICD-10-CM | POA: Diagnosis not present

## 2022-02-05 DIAGNOSIS — R69 Illness, unspecified: Secondary | ICD-10-CM | POA: Diagnosis not present

## 2022-02-05 DIAGNOSIS — E668 Other obesity: Secondary | ICD-10-CM | POA: Diagnosis not present

## 2022-02-05 DIAGNOSIS — F411 Generalized anxiety disorder: Secondary | ICD-10-CM | POA: Diagnosis not present

## 2022-02-05 DIAGNOSIS — I509 Heart failure, unspecified: Secondary | ICD-10-CM | POA: Diagnosis not present

## 2022-02-05 DIAGNOSIS — D509 Iron deficiency anemia, unspecified: Secondary | ICD-10-CM | POA: Diagnosis not present

## 2022-02-09 DIAGNOSIS — I5032 Chronic diastolic (congestive) heart failure: Secondary | ICD-10-CM | POA: Diagnosis not present

## 2022-02-09 DIAGNOSIS — Z8719 Personal history of other diseases of the digestive system: Secondary | ICD-10-CM | POA: Diagnosis not present

## 2022-02-09 DIAGNOSIS — Z87891 Personal history of nicotine dependence: Secondary | ICD-10-CM | POA: Diagnosis not present

## 2022-02-09 DIAGNOSIS — Z7982 Long term (current) use of aspirin: Secondary | ICD-10-CM | POA: Diagnosis not present

## 2022-02-09 DIAGNOSIS — W19XXXA Unspecified fall, initial encounter: Secondary | ICD-10-CM | POA: Diagnosis not present

## 2022-02-09 DIAGNOSIS — R69 Illness, unspecified: Secondary | ICD-10-CM | POA: Diagnosis not present

## 2022-02-09 DIAGNOSIS — I480 Paroxysmal atrial fibrillation: Secondary | ICD-10-CM | POA: Diagnosis not present

## 2022-02-09 DIAGNOSIS — K439 Ventral hernia without obstruction or gangrene: Secondary | ICD-10-CM | POA: Diagnosis not present

## 2022-02-09 DIAGNOSIS — Z7901 Long term (current) use of anticoagulants: Secondary | ICD-10-CM | POA: Diagnosis not present

## 2022-02-09 DIAGNOSIS — K660 Peritoneal adhesions (postprocedural) (postinfection): Secondary | ICD-10-CM | POA: Diagnosis not present

## 2022-02-09 DIAGNOSIS — Z8614 Personal history of Methicillin resistant Staphylococcus aureus infection: Secondary | ICD-10-CM | POA: Diagnosis not present

## 2022-02-09 DIAGNOSIS — I4892 Unspecified atrial flutter: Secondary | ICD-10-CM | POA: Diagnosis not present

## 2022-02-09 DIAGNOSIS — F419 Anxiety disorder, unspecified: Secondary | ICD-10-CM | POA: Diagnosis not present

## 2022-02-09 DIAGNOSIS — Z79899 Other long term (current) drug therapy: Secondary | ICD-10-CM | POA: Diagnosis not present

## 2022-02-09 DIAGNOSIS — I110 Hypertensive heart disease with heart failure: Secondary | ICD-10-CM | POA: Diagnosis not present

## 2022-02-09 DIAGNOSIS — I10 Essential (primary) hypertension: Secondary | ICD-10-CM | POA: Diagnosis not present

## 2022-02-09 DIAGNOSIS — D509 Iron deficiency anemia, unspecified: Secondary | ICD-10-CM | POA: Diagnosis not present

## 2022-02-09 DIAGNOSIS — Z888 Allergy status to other drugs, medicaments and biological substances status: Secondary | ICD-10-CM | POA: Diagnosis not present

## 2022-02-09 DIAGNOSIS — Z6841 Body Mass Index (BMI) 40.0 and over, adult: Secondary | ICD-10-CM | POA: Diagnosis not present

## 2022-02-09 DIAGNOSIS — Z7984 Long term (current) use of oral hypoglycemic drugs: Secondary | ICD-10-CM | POA: Diagnosis not present

## 2022-02-09 DIAGNOSIS — G4733 Obstructive sleep apnea (adult) (pediatric): Secondary | ICD-10-CM | POA: Diagnosis not present

## 2022-02-09 DIAGNOSIS — I4819 Other persistent atrial fibrillation: Secondary | ICD-10-CM | POA: Diagnosis not present

## 2022-02-09 DIAGNOSIS — E785 Hyperlipidemia, unspecified: Secondary | ICD-10-CM | POA: Diagnosis not present

## 2022-02-09 DIAGNOSIS — E119 Type 2 diabetes mellitus without complications: Secondary | ICD-10-CM | POA: Diagnosis not present

## 2022-02-09 DIAGNOSIS — H34231 Retinal artery branch occlusion, right eye: Secondary | ICD-10-CM | POA: Diagnosis not present

## 2022-02-09 DIAGNOSIS — Z952 Presence of prosthetic heart valve: Secondary | ICD-10-CM | POA: Diagnosis not present

## 2022-02-28 DIAGNOSIS — B349 Viral infection, unspecified: Secondary | ICD-10-CM | POA: Diagnosis not present

## 2022-02-28 DIAGNOSIS — R69 Illness, unspecified: Secondary | ICD-10-CM | POA: Diagnosis not present

## 2022-02-28 DIAGNOSIS — K439 Ventral hernia without obstruction or gangrene: Secondary | ICD-10-CM | POA: Diagnosis not present

## 2022-02-28 DIAGNOSIS — E785 Hyperlipidemia, unspecified: Secondary | ICD-10-CM | POA: Diagnosis not present

## 2022-02-28 DIAGNOSIS — D509 Iron deficiency anemia, unspecified: Secondary | ICD-10-CM | POA: Diagnosis not present

## 2022-02-28 DIAGNOSIS — I10 Essential (primary) hypertension: Secondary | ICD-10-CM | POA: Diagnosis not present

## 2022-02-28 DIAGNOSIS — E668 Other obesity: Secondary | ICD-10-CM | POA: Diagnosis not present

## 2022-02-28 DIAGNOSIS — I350 Nonrheumatic aortic (valve) stenosis: Secondary | ICD-10-CM | POA: Diagnosis not present

## 2022-02-28 DIAGNOSIS — E119 Type 2 diabetes mellitus without complications: Secondary | ICD-10-CM | POA: Diagnosis not present

## 2022-02-28 DIAGNOSIS — J209 Acute bronchitis, unspecified: Secondary | ICD-10-CM | POA: Diagnosis not present

## 2022-02-28 DIAGNOSIS — I6389 Other cerebral infarction: Secondary | ICD-10-CM | POA: Diagnosis not present

## 2022-03-06 DIAGNOSIS — F419 Anxiety disorder, unspecified: Secondary | ICD-10-CM | POA: Diagnosis not present

## 2022-03-06 DIAGNOSIS — Z8614 Personal history of Methicillin resistant Staphylococcus aureus infection: Secondary | ICD-10-CM | POA: Diagnosis not present

## 2022-03-06 DIAGNOSIS — Z7901 Long term (current) use of anticoagulants: Secondary | ICD-10-CM | POA: Diagnosis not present

## 2022-03-06 DIAGNOSIS — Z9884 Bariatric surgery status: Secondary | ICD-10-CM | POA: Diagnosis not present

## 2022-03-06 DIAGNOSIS — Z48815 Encounter for surgical aftercare following surgery on the digestive system: Secondary | ICD-10-CM | POA: Diagnosis not present

## 2022-03-06 DIAGNOSIS — R69 Illness, unspecified: Secondary | ICD-10-CM | POA: Diagnosis not present

## 2022-03-13 DIAGNOSIS — Z7901 Long term (current) use of anticoagulants: Secondary | ICD-10-CM | POA: Diagnosis not present

## 2022-03-13 DIAGNOSIS — R69 Illness, unspecified: Secondary | ICD-10-CM | POA: Diagnosis not present

## 2022-03-13 DIAGNOSIS — Z9884 Bariatric surgery status: Secondary | ICD-10-CM | POA: Diagnosis not present

## 2022-03-13 DIAGNOSIS — I509 Heart failure, unspecified: Secondary | ICD-10-CM | POA: Diagnosis not present

## 2022-03-13 DIAGNOSIS — Z09 Encounter for follow-up examination after completed treatment for conditions other than malignant neoplasm: Secondary | ICD-10-CM | POA: Diagnosis not present

## 2022-03-13 DIAGNOSIS — E119 Type 2 diabetes mellitus without complications: Secondary | ICD-10-CM | POA: Diagnosis not present

## 2022-03-13 DIAGNOSIS — Z4889 Encounter for other specified surgical aftercare: Secondary | ICD-10-CM | POA: Diagnosis not present

## 2022-03-13 DIAGNOSIS — F419 Anxiety disorder, unspecified: Secondary | ICD-10-CM | POA: Diagnosis not present

## 2022-03-13 DIAGNOSIS — I110 Hypertensive heart disease with heart failure: Secondary | ICD-10-CM | POA: Diagnosis not present

## 2022-03-13 DIAGNOSIS — Z48815 Encounter for surgical aftercare following surgery on the digestive system: Secondary | ICD-10-CM | POA: Diagnosis not present

## 2022-04-02 ENCOUNTER — Other Ambulatory Visit: Payer: Self-pay | Admitting: Cardiovascular Disease

## 2022-04-03 ENCOUNTER — Other Ambulatory Visit: Payer: Self-pay

## 2022-04-06 ENCOUNTER — Encounter: Payer: Self-pay | Admitting: Cardiovascular Disease

## 2022-04-06 ENCOUNTER — Telehealth: Payer: Self-pay

## 2022-04-06 NOTE — Telephone Encounter (Signed)
Patient is asking for you to send him a portal message on here, he think we have him blocked he wants to give you information about his INR

## 2022-04-10 ENCOUNTER — Other Ambulatory Visit: Payer: Medicare HMO

## 2022-04-10 ENCOUNTER — Other Ambulatory Visit: Payer: Self-pay | Admitting: Internal Medicine

## 2022-04-10 DIAGNOSIS — I509 Heart failure, unspecified: Secondary | ICD-10-CM | POA: Diagnosis not present

## 2022-04-10 DIAGNOSIS — I10 Essential (primary) hypertension: Secondary | ICD-10-CM | POA: Diagnosis not present

## 2022-04-10 DIAGNOSIS — E119 Type 2 diabetes mellitus without complications: Secondary | ICD-10-CM | POA: Diagnosis not present

## 2022-04-10 DIAGNOSIS — D509 Iron deficiency anemia, unspecified: Secondary | ICD-10-CM | POA: Diagnosis not present

## 2022-04-11 LAB — CBC WITH DIFFERENTIAL/PLATELET
Basophils Absolute: 0 10*3/uL (ref 0.0–0.2)
Basos: 0 %
EOS (ABSOLUTE): 0 10*3/uL (ref 0.0–0.4)
Eos: 0 %
Hematocrit: 43.5 % (ref 37.5–51.0)
Hemoglobin: 13.9 g/dL (ref 13.0–17.7)
Immature Grans (Abs): 0 10*3/uL (ref 0.0–0.1)
Immature Granulocytes: 0 %
Lymphocytes Absolute: 1 10*3/uL (ref 0.7–3.1)
Lymphs: 11 %
MCH: 28.6 pg (ref 26.6–33.0)
MCHC: 32 g/dL (ref 31.5–35.7)
MCV: 90 fL (ref 79–97)
Monocytes Absolute: 0.5 10*3/uL (ref 0.1–0.9)
Monocytes: 6 %
Neutrophils Absolute: 7.1 10*3/uL — ABNORMAL HIGH (ref 1.4–7.0)
Neutrophils: 83 %
Platelets: 188 10*3/uL (ref 150–450)
RBC: 4.86 x10E6/uL (ref 4.14–5.80)
RDW: 15.3 % (ref 11.6–15.4)
WBC: 8.7 10*3/uL (ref 3.4–10.8)

## 2022-04-11 LAB — HGB A1C W/O EAG: Hgb A1c MFr Bld: 5.8 % — ABNORMAL HIGH (ref 4.8–5.6)

## 2022-04-11 LAB — LIPID PANEL W/O CHOL/HDL RATIO
Cholesterol, Total: 150 mg/dL (ref 100–199)
HDL: 63 mg/dL (ref >39)
LDL Chol Calc (NIH): 65 mg/dL (ref 0–99)
Triglycerides: 123 mg/dL (ref 0–149)
VLDL Cholesterol Cal: 22 mg/dL (ref 5–40)

## 2022-04-11 LAB — MAGNESIUM: Magnesium: 1.1 mg/dL — ABNORMAL LOW (ref 1.6–2.3)

## 2022-04-17 ENCOUNTER — Encounter: Payer: Self-pay | Admitting: Internal Medicine

## 2022-04-17 ENCOUNTER — Other Ambulatory Visit: Payer: Self-pay

## 2022-04-17 MED ORDER — CELECOXIB 400 MG PO CAPS: 400.0000 mg | ORAL_CAPSULE | Freq: Every morning | ORAL | 0 refills | Status: DC

## 2022-04-17 MED ORDER — ONDANSETRON 4 MG PO TBDP: 4.0000 mg | ORAL_TABLET | Freq: Three times a day (TID) | ORAL | 3 refills | Status: DC | PRN

## 2022-05-04 ENCOUNTER — Other Ambulatory Visit: Payer: Self-pay

## 2022-05-04 ENCOUNTER — Other Ambulatory Visit: Payer: Self-pay | Admitting: Internal Medicine

## 2022-05-04 ENCOUNTER — Ambulatory Visit (INDEPENDENT_AMBULATORY_CARE_PROVIDER_SITE_OTHER): Payer: Medicare HMO | Admitting: Internal Medicine

## 2022-05-04 ENCOUNTER — Encounter: Payer: Self-pay | Admitting: Internal Medicine

## 2022-05-04 VITALS — BP 140/80 | HR 77 | Ht 69.0 in | Wt >= 6400 oz

## 2022-05-04 DIAGNOSIS — F419 Anxiety disorder, unspecified: Secondary | ICD-10-CM

## 2022-05-04 DIAGNOSIS — E119 Type 2 diabetes mellitus without complications: Secondary | ICD-10-CM

## 2022-05-04 DIAGNOSIS — N400 Enlarged prostate without lower urinary tract symptoms: Secondary | ICD-10-CM | POA: Diagnosis not present

## 2022-05-04 DIAGNOSIS — Z952 Presence of prosthetic heart valve: Secondary | ICD-10-CM

## 2022-05-04 DIAGNOSIS — I10 Essential (primary) hypertension: Secondary | ICD-10-CM | POA: Diagnosis not present

## 2022-05-04 DIAGNOSIS — I4811 Longstanding persistent atrial fibrillation: Secondary | ICD-10-CM | POA: Diagnosis not present

## 2022-05-04 DIAGNOSIS — R69 Illness, unspecified: Secondary | ICD-10-CM | POA: Diagnosis not present

## 2022-05-04 DIAGNOSIS — I5032 Chronic diastolic (congestive) heart failure: Secondary | ICD-10-CM

## 2022-05-04 MED ORDER — LORAZEPAM 1 MG PO TABS: 1.0000 mg | ORAL_TABLET | Freq: Two times a day (BID) | ORAL | 2 refills | Status: DC

## 2022-05-04 NOTE — Progress Notes (Signed)
Established Patient Office Visit  Subjective:  Patient ID: Russell Ray, male    DOB: 1971/01/26  Age: 52 y.o. MRN: [id number]  Chief Complaint  Patient presents with   Follow-up    Follow up    No new complaints, here for lab review and medication refills.  Last labs were notable for persistently low Mag. Hgba1c and lipids were well controlled.    Past Medical History:  Diagnosis Date   Anxiety    Aortic valve stenosis    Atrial fibrillation (HCC)    CHF (congestive heart failure) ([place])    Depression    Diabetes mellitus without complication ([place])    Hypertension     Past Surgical History:  Procedure Laterality Date   APPLICATION OF WOUND VAC     CARDIAC CATHETERIZATION Right 04/22/2015   Procedure: Right/Left Heart Cath and Coronary Angiography;  Surgeon: Dionisio David, MD;  Location: [place] [facility];  Service: Cardiovascular;  Laterality: Right;   CARDIAC VALVE REPLACEMENT     CARDIOVERSION N/A 05/14/2017   Procedure: CARDIOVERSION;  Surgeon: Dionisio David, MD;  Location: [facility] ORS;  Service: Cardiovascular;  Laterality: N/A;   COLONOSCOPY N/A 10/21/2020   Procedure: COLONOSCOPY;  Surgeon: Toledo, Benay Pike, MD;  Location: [facility] ENDOSCOPY;  Service: Gastroenterology;  Laterality: N/A;   COLONOSCOPY WITH PROPOFOL N/A 10/05/2016   Procedure: COLONOSCOPY WITH PROPOFOL;  Surgeon: Manya Silvas, MD;  Location: [facility] ENDOSCOPY;  Service: Endoscopy;  Laterality: N/A;   debridement sternum     ELECTROPHYSIOLOGIC STUDY N/A 05/05/2015   Procedure: Cardioversion;  Surgeon: Dionisio David, MD;  Location: [facility] ORS;  Service: Cardiovascular;  Laterality: N/A;   ELECTROPHYSIOLOGIC STUDY N/A 05/17/2015   Procedure: CARDIOVERSION;  Surgeon: Dionisio David, MD;  Location: [facility] ORS;  Service: Cardiovascular;  Laterality: N/A;   ESOPHAGOGASTRODUODENOSCOPY (EGD) WITH PROPOFOL N/A 10/05/2016   Procedure: ESOPHAGOGASTRODUODENOSCOPY (EGD) WITH PROPOFOL;  Surgeon: Manya Silvas, MD;   Location: [facility] ENDOSCOPY;  Service: Endoscopy;  Laterality: N/A;   GREATER OMENTAL FLAP CLOSURE     HERNIA REPAIR     X3   VALVE REPLACEMENT     VENTRAL HERNIA REPAIR N/A 12/01/2016   Procedure: HERNIA REPAIR VENTRAL ADULT with mesh;  Surgeon: Jules Husbands, MD;  Location: [facility] ORS;  Service: General;  Laterality: N/A;    Social History   Socioeconomic History   Marital status: Single    Spouse name: Not on file   Number of children: Not on file   Years of education: Not on file   Highest education level: Not on file  Occupational History   Occupation: Transit Driver  Tobacco Use   Smoking status: Former    Packs/day: 2.00    Years: 28.00    Total pack years: 56.00    Types: Cigarettes   Smokeless tobacco: Never  Vaping Use   Vaping Use: Never used  Substance and Sexual Activity   Alcohol use: No   Drug use: No   Sexual activity: Yes  Other Topics Concern   Not on file  Social History Narrative   Not on file   Social Determinants of Health   Financial Resource Strain: Not on file  Food Insecurity: Not on file  Transportation Needs: Not on file  Physical Activity: Not on file  Stress: Not on file  Social Connections: Not on file  Intimate Partner Violence: Not on file    Family History  Problem Relation Age of Onset   Hypertension Mother  Hypertension Maternal Grandmother    Diabetes Maternal Grandmother     Allergies  Allergen Reactions   Lovenox [Enoxaparin Sodium] Itching    Review of Systems  Constitutional: Negative.   HENT: Negative.    Eyes: Negative.   Respiratory: Negative.    Cardiovascular: Negative.   Gastrointestinal: Negative.   Genitourinary: Negative.   Skin: Negative.   Neurological: Negative.   Endo/Heme/Allergies: Negative.        Objective:   BP (!) 140/80   Pulse 77   Ht '5\' 9"'$  (1.753 m)   Wt (!) 449 lb (203.7 kg)   SpO2 94%   BMI 66.31 kg/m   Vitals:   05/04/22 1457  BP: (!) 140/80  Pulse: 77  Height: '5\' 9"'$   (1.753 m)  Weight: (!) 449 lb (203.7 kg)  SpO2: 94%  BMI (Calculated): 66.28    Physical Exam Vitals reviewed.  Constitutional:      Appearance: Normal appearance. He is obese.  HENT:     Head: Normocephalic.     Left Ear: There is no impacted cerumen.     Nose: Nose normal.     Mouth/Throat:     Mouth: Mucous membranes are moist.     Pharynx: No posterior oropharyngeal erythema.  Eyes:     Extraocular Movements: Extraocular movements intact.     Pupils: Pupils are equal, round, and reactive to light.  Cardiovascular:     Rate and Rhythm: Regular rhythm.     Chest Wall: PMI is not displaced.     Pulses: Normal pulses.     Heart sounds: Normal heart sounds. No murmur heard. Pulmonary:     Effort: Pulmonary effort is normal.     Breath sounds: Normal air entry. No rhonchi or rales.  Abdominal:     General: Abdomen is flat. Bowel sounds are normal. There is no distension.     Palpations: Abdomen is soft. There is no hepatomegaly, splenomegaly or mass.     Tenderness: There is no abdominal tenderness.  Musculoskeletal:        General: Normal range of motion.     Cervical back: Normal range of motion and neck supple.     Right lower leg: No edema.     Left lower leg: No edema.  Skin:    General: Skin is warm and dry.  Neurological:     General: No focal deficit present.     Mental Status: He is alert and oriented to person, place, and time.     Cranial Nerves: No cranial nerve deficit.     Motor: No weakness.  Psychiatric:        Mood and Affect: Mood normal.        Behavior: Behavior normal.      No results found for any visits on 05/04/22.  Recent Results (from the past 2160 hour(Jenesis Suchy))  CBC with Differential/Platelet     Status: Abnormal   Collection Time: 04/10/22  1:46 PM  Result Value Ref Range   WBC 8.7 3.4 - 10.8 x10E3/uL   RBC 4.86 4.14 - 5.80 x10E6/uL   Hemoglobin 13.9 13.0 - 17.7 g/dL   Hematocrit 43.5 37.5 - 51.0 %   MCV 90 79 - 97 fL   MCH 28.6 26.6  - 33.0 pg   MCHC 32.0 31.5 - 35.7 g/dL   RDW 15.3 11.6 - 15.4 %   Platelets 188 150 - 450 x10E3/uL   Neutrophils 83 Not Estab. %   Lymphs 11 Not Estab. %  Monocytes 6 Not Estab. %   Eos 0 Not Estab. %   Basos 0 Not Estab. %   Neutrophils Absolute 7.1 (H) 1.4 - 7.0 x10E3/uL   Lymphocytes Absolute 1.0 0.7 - 3.1 x10E3/uL   Monocytes Absolute 0.5 0.1 - 0.9 x10E3/uL   EOS (ABSOLUTE) 0.0 0.0 - 0.4 x10E3/uL   Basophils Absolute 0.0 0.0 - 0.2 x10E3/uL   Immature Granulocytes 0 Not Estab. %   Immature Grans (Abs) 0.0 0.0 - 0.1 x10E3/uL  Lipid Panel w/o Chol/HDL Ratio     Status: None   Collection Time: 04/10/22  1:46 PM  Result Value Ref Range   Cholesterol, Total 150 100 - 199 mg/dL   Triglycerides 123 0 - 149 mg/dL   HDL 63 >39 mg/dL   VLDL Cholesterol Cal 22 5 - 40 mg/dL   LDL Chol Calc (NIH) 65 0 - 99 mg/dL  Hgb A1c w/o eAG     Status: Abnormal   Collection Time: 04/10/22  1:46 PM  Result Value Ref Range   Hgb A1c MFr Bld 5.8 (H) 4.8 - 5.6 %    Comment:          Prediabetes: 5.7 - 6.4          Diabetes: >6.4          Glycemic control for adults with diabetes: <7.0   Magnesium     Status: Abnormal   Collection Time: 04/10/22  1:46 PM  Result Value Ref Range   Magnesium 1.1 (L) 1.6 - 2.3 mg/dL      Assessment & Plan:   Problem List Items Addressed This Visit   None 1. Longstanding persistent atrial fibrillation (HCC) - CBC With Diff/Platelet; Future  2. Essential hypertension - Comprehensive metabolic panel; Future  3. Chronic diastolic CHF (congestive heart failure) ([place])  4. Type 2 diabetes mellitus without complication, without long-term current use of insulin (HCC) - Hemoglobin A1c; Future - Lipid panel; Future  5. Morbid obesity ([place])  6. H/O aortic valve replacement  7. Benign prostatic hyperplasia without lower urinary tract symptoms - PSA  8. Anxiety - LORazepam (ATIVAN) 1 MG tablet; Take 1 tablet (1 mg total) by mouth 2 (two) times daily.  Dispense:  30 tablet; Refill: 2    No follow-ups on file.   Total time spent: 20 minutes  Volanda Napoleon, MD  05/04/2022

## 2022-05-04 NOTE — Addendum Note (Signed)
Addended by: Wyvonna Plum on: 05/04/2022 04:44 PM   Modules accepted: Orders

## 2022-06-01 ENCOUNTER — Encounter: Payer: Self-pay | Admitting: Internal Medicine

## 2022-06-01 ENCOUNTER — Other Ambulatory Visit: Payer: Self-pay | Admitting: Nurse Practitioner

## 2022-06-01 MED ORDER — AMIODARONE HCL 200 MG PO TABS: 200.0000 mg | ORAL_TABLET | Freq: Two times a day (BID) | ORAL | 0 refills | Status: DC

## 2022-06-01 MED ORDER — LISINOPRIL 20 MG PO TABS: 20.0000 mg | ORAL_TABLET | Freq: Every day | ORAL | 0 refills | Status: DC

## 2022-06-06 ENCOUNTER — Other Ambulatory Visit: Payer: Self-pay

## 2022-06-15 DIAGNOSIS — Z0100 Encounter for examination of eyes and vision without abnormal findings: Secondary | ICD-10-CM | POA: Diagnosis not present

## 2022-06-15 DIAGNOSIS — H34231 Retinal artery branch occlusion, right eye: Secondary | ICD-10-CM | POA: Diagnosis not present

## 2022-07-05 ENCOUNTER — Other Ambulatory Visit: Payer: Self-pay | Admitting: Cardiovascular Disease

## 2022-07-05 ENCOUNTER — Encounter: Payer: Self-pay | Admitting: Internal Medicine

## 2022-07-05 ENCOUNTER — Other Ambulatory Visit: Payer: Self-pay | Admitting: Internal Medicine

## 2022-07-05 DIAGNOSIS — I4891 Unspecified atrial fibrillation: Secondary | ICD-10-CM

## 2022-07-05 DIAGNOSIS — I10 Essential (primary) hypertension: Secondary | ICD-10-CM

## 2022-07-05 DIAGNOSIS — E785 Hyperlipidemia, unspecified: Secondary | ICD-10-CM

## 2022-07-05 DIAGNOSIS — E119 Type 2 diabetes mellitus without complications: Secondary | ICD-10-CM

## 2022-07-05 DIAGNOSIS — N400 Enlarged prostate without lower urinary tract symptoms: Secondary | ICD-10-CM

## 2022-07-06 ENCOUNTER — Other Ambulatory Visit: Payer: Self-pay | Admitting: Cardiovascular Disease

## 2022-07-06 ENCOUNTER — Other Ambulatory Visit: Payer: Medicare HMO

## 2022-07-06 DIAGNOSIS — I4811 Longstanding persistent atrial fibrillation: Secondary | ICD-10-CM | POA: Diagnosis not present

## 2022-07-06 DIAGNOSIS — I10 Essential (primary) hypertension: Secondary | ICD-10-CM | POA: Diagnosis not present

## 2022-07-06 DIAGNOSIS — E119 Type 2 diabetes mellitus without complications: Secondary | ICD-10-CM | POA: Diagnosis not present

## 2022-07-06 DIAGNOSIS — N400 Enlarged prostate without lower urinary tract symptoms: Secondary | ICD-10-CM | POA: Diagnosis not present

## 2022-07-07 LAB — CBC WITH DIFFERENTIAL
Basophils Absolute: 0 10*3/uL (ref 0.0–0.2)
Basos: 1 %
EOS (ABSOLUTE): 0 10*3/uL (ref 0.0–0.4)
Eos: 1 %
Hematocrit: 46.3 % (ref 37.5–51.0)
Hemoglobin: 14.8 g/dL (ref 13.0–17.7)
Immature Grans (Abs): 0 10*3/uL (ref 0.0–0.1)
Immature Granulocytes: 0 %
Lymphocytes Absolute: 1.4 10*3/uL (ref 0.7–3.1)
Lymphs: 24 %
MCH: 28.7 pg (ref 26.6–33.0)
MCHC: 32 g/dL (ref 31.5–35.7)
MCV: 90 fL (ref 79–97)
Monocytes Absolute: 0.4 10*3/uL (ref 0.1–0.9)
Monocytes: 7 %
Neutrophils Absolute: 3.9 10*3/uL (ref 1.4–7.0)
Neutrophils: 67 %
RBC: 5.16 x10E6/uL (ref 4.14–5.80)
RDW: 13.2 % (ref 11.6–15.4)
WBC: 5.8 10*3/uL (ref 3.4–10.8)

## 2022-07-07 LAB — HEMOGLOBIN A1C
Est. average glucose Bld gHb Est-mCnc: 120 mg/dL
Hgb A1c MFr Bld: 5.8 % — ABNORMAL HIGH (ref 4.8–5.6)

## 2022-07-07 LAB — CMP14+EGFR
ALT: 18 IU/L (ref 0–44)
AST: 20 IU/L (ref 0–40)
Albumin/Globulin Ratio: 1.9 (ref 1.2–2.2)
Albumin: 4.3 g/dL (ref 4.1–5.1)
Alkaline Phosphatase: 79 IU/L (ref 44–121)
BUN/Creatinine Ratio: 14 (ref 9–20)
BUN: 12 mg/dL (ref 6–24)
Bilirubin Total: 1 mg/dL (ref 0.0–1.2)
CO2: 21 mmol/L (ref 20–29)
Calcium: 10.4 mg/dL — ABNORMAL HIGH (ref 8.7–10.2)
Chloride: 101 mmol/L (ref 96–106)
Creatinine, Ser: 0.87 mg/dL (ref 0.76–1.27)
Globulin, Total: 2.3 g/dL (ref 1.5–4.5)
Glucose: 87 mg/dL (ref 70–99)
Potassium: 4.2 mmol/L (ref 3.5–5.2)
Sodium: 140 mmol/L (ref 134–144)
Total Protein: 6.6 g/dL (ref 6.0–8.5)
eGFR: 105 mL/min/{1.73_m2} (ref >59)

## 2022-07-07 LAB — PSA: Prostate Specific Ag, Serum: 0.3 ng/mL (ref 0.0–4.0)

## 2022-07-07 LAB — LIPID PANEL
Chol/HDL Ratio: 2.2 ratio (ref 0.0–5.0)
Cholesterol, Total: 147 mg/dL (ref 100–199)
HDL: 68 mg/dL (ref >39)
LDL Chol Calc (NIH): 58 mg/dL (ref 0–99)
Triglycerides: 118 mg/dL (ref 0–149)
VLDL Cholesterol Cal: 21 mg/dL (ref 5–40)

## 2022-07-07 LAB — PROTIME-INR
INR: 2.5 — ABNORMAL HIGH (ref 0.9–1.2)
Prothrombin Time: 25.6 s — ABNORMAL HIGH (ref 9.1–12.0)

## 2022-07-07 LAB — TSH: TSH: 2.75 u[IU]/mL (ref 0.450–4.500)

## 2022-07-27 ENCOUNTER — Encounter: Payer: Self-pay | Admitting: Internal Medicine

## 2022-07-27 ENCOUNTER — Ambulatory Visit (INDEPENDENT_AMBULATORY_CARE_PROVIDER_SITE_OTHER): Payer: Medicare HMO | Admitting: Internal Medicine

## 2022-07-27 VITALS — BP 108/68 | HR 85 | Ht 69.0 in | Wt >= 6400 oz

## 2022-07-27 DIAGNOSIS — F419 Anxiety disorder, unspecified: Secondary | ICD-10-CM | POA: Diagnosis not present

## 2022-07-27 DIAGNOSIS — J0110 Acute frontal sinusitis, unspecified: Secondary | ICD-10-CM | POA: Diagnosis not present

## 2022-07-27 DIAGNOSIS — E119 Type 2 diabetes mellitus without complications: Secondary | ICD-10-CM | POA: Diagnosis not present

## 2022-07-27 DIAGNOSIS — I10 Essential (primary) hypertension: Secondary | ICD-10-CM

## 2022-07-27 DIAGNOSIS — J301 Allergic rhinitis due to pollen: Secondary | ICD-10-CM

## 2022-07-27 MED ORDER — DOXYCYCLINE MONOHYDRATE 100 MG PO TABS
100.0000 mg | ORAL_TABLET | Freq: Two times a day (BID) | ORAL | 0 refills | Status: AC
Start: 2022-07-27 — End: 2022-08-01

## 2022-07-27 MED ORDER — LORAZEPAM 1 MG PO TABS
1.0000 mg | ORAL_TABLET | Freq: Two times a day (BID) | ORAL | 2 refills | Status: DC
Start: 2022-07-27 — End: 2022-11-02

## 2022-07-27 MED ORDER — FLUTICASONE PROPIONATE 50 MCG/ACT NA SUSP
1.0000 | Freq: Every day | NASAL | 2 refills | Status: DC
Start: 2022-07-27 — End: 2023-05-03

## 2022-07-27 MED ORDER — LORAZEPAM 1 MG PO TABS: 1.0000 mg | ORAL_TABLET | Freq: Two times a day (BID) | ORAL | 2 refills | Status: DC

## 2022-07-27 MED ORDER — CETIRIZINE HCL 10 MG PO TABS
10.0000 mg | ORAL_TABLET | Freq: Every day | ORAL | 2 refills | Status: DC
Start: 2022-07-27 — End: 2023-05-03

## 2022-07-27 NOTE — Progress Notes (Signed)
Established Patient Office Visit  Subjective:  Patient ID: Russell Ray, male    DOB: Jul 18, 1970  Age: 52 y.o. MRN: [id number]  Chief Complaint  Patient presents with   Acute Visit    Sinus and head congestion    C/o nasal congestion, head fullness, sore throat, cough productive of yellowish sputum x 3 days. Also c/o ear fullness and mild vertigo.    No other concerns at this time.   Past Medical History:  Diagnosis Date   Anxiety    Aortic valve stenosis    Atrial fibrillation (HCC)    CHF (congestive heart failure) (HCC)    Depression    Diabetes mellitus without complication (HCC)    Hypertension     Past Surgical History:  Procedure Laterality Date   APPLICATION OF WOUND VAC     CARDIAC CATHETERIZATION Right 04/22/2015   Procedure: Right/Left Heart Cath and Coronary Angiography;  Surgeon: Laurier Nancy, MD;  Location: [facility] INVASIVE CV [facility];  Service: Cardiovascular;  Laterality: Right;   CARDIAC VALVE REPLACEMENT     CARDIOVERSION N/A 05/14/2017   Procedure: CARDIOVERSION;  Surgeon: Laurier Nancy, MD;  Location: [facility] ORS;  Service: Cardiovascular;  Laterality: N/A;   COLONOSCOPY N/A 10/21/2020   Procedure: COLONOSCOPY;  Surgeon: Toledo, Boykin Nearing, MD;  Location: [facility] ENDOSCOPY;  Service: Gastroenterology;  Laterality: N/A;   COLONOSCOPY WITH PROPOFOL N/A 10/05/2016   Procedure: COLONOSCOPY WITH PROPOFOL;  Surgeon: Scot Jun, MD;  Location: [facility] ENDOSCOPY;  Service: Endoscopy;  Laterality: N/A;   debridement sternum     ELECTROPHYSIOLOGIC STUDY N/A 05/05/2015   Procedure: Cardioversion;  Surgeon: Laurier Nancy, MD;  Location: [facility] ORS;  Service: Cardiovascular;  Laterality: N/A;   ELECTROPHYSIOLOGIC STUDY N/A 05/17/2015   Procedure: CARDIOVERSION;  Surgeon: Laurier Nancy, MD;  Location: [facility] ORS;  Service: Cardiovascular;  Laterality: N/A;   ESOPHAGOGASTRODUODENOSCOPY (EGD) WITH PROPOFOL N/A 10/05/2016   Procedure: ESOPHAGOGASTRODUODENOSCOPY (EGD) WITH PROPOFOL;   Surgeon: Scot Jun, MD;  Location: [facility] ENDOSCOPY;  Service: Endoscopy;  Laterality: N/A;   GREATER OMENTAL FLAP CLOSURE     HERNIA REPAIR     X3   VALVE REPLACEMENT     VENTRAL HERNIA REPAIR N/A 12/01/2016   Procedure: HERNIA REPAIR VENTRAL ADULT with mesh;  Surgeon: Leafy Ro, MD;  Location: [facility] ORS;  Service: General;  Laterality: N/A;    Social History   Socioeconomic History   Marital status: Single    Spouse name: Not on file   Number of children: Not on file   Years of education: Not on file   Highest education level: Not on file  Occupational History   Occupation: Transit Driver  Tobacco Use   Smoking status: Former    Packs/day: 2.00    Years: 28.00    Additional pack years: 0.00    Total pack years: 56.00    Types: Cigarettes   Smokeless tobacco: Never  Vaping Use   Vaping Use: Never used  Substance and Sexual Activity   Alcohol use: No   Drug use: No   Sexual activity: Yes  Other Topics Concern   Not on file  Social History Narrative   Not on file   Social Determinants of Health   Financial Resource Strain: Not on file  Food Insecurity: Not on file  Transportation Needs: Not on file  Physical Activity: Not on file  Stress: Not on file  Social Connections: Not on file  Intimate Partner Violence: Not on file  Family History  Problem Relation Age of Onset   Hypertension Mother    Hypertension Maternal Grandmother    Diabetes Maternal Grandmother     Allergies  Allergen Reactions   Lovenox [Enoxaparin Sodium] Itching    Review of Systems  Constitutional: Negative.   HENT: Negative.    Eyes: Negative.   Respiratory: Negative.    Cardiovascular: Negative.   Gastrointestinal: Negative.   Genitourinary: Negative.   Skin: Negative.   Neurological: Negative.   Endo/Heme/Allergies: Negative.        Objective:   BP 108/68   Pulse 85   Ht 5\' 9"  (1.753 m)   Wt (!) 421 lb (191 kg)   SpO2 96%   BMI 62.17 kg/m   Vitals:    07/27/22 1051  BP: 108/68  Pulse: 85  Height: 5\' 9"  (1.753 m)  Weight: (!) 421 lb (191 kg)  SpO2: 96%  BMI (Calculated): 62.14    Physical Exam Vitals reviewed.  Constitutional:      Appearance: Normal appearance.  HENT:     Head: Normocephalic.     Left Ear: There is no impacted cerumen.     Nose: Nose normal.     Mouth/Throat:     Mouth: Mucous membranes are moist.     Pharynx: No posterior oropharyngeal erythema.  Eyes:     Extraocular Movements: Extraocular movements intact.     Pupils: Pupils are equal, round, and reactive to light.  Cardiovascular:     Rate and Rhythm: Regular rhythm.     Chest Wall: PMI is not displaced.     Pulses: Normal pulses.     Heart sounds: Normal heart sounds. No murmur heard. Pulmonary:     Effort: Pulmonary effort is normal.     Breath sounds: Normal air entry. No rhonchi or rales.  Abdominal:     General: Abdomen is flat. Bowel sounds are normal. There is no distension.     Palpations: Abdomen is soft. There is no hepatomegaly, splenomegaly or mass.     Tenderness: There is no abdominal tenderness.  Musculoskeletal:        General: Normal range of motion.     Cervical back: Normal range of motion and neck supple.     Right lower leg: No edema.     Left lower leg: No edema.  Skin:    General: Skin is warm and dry.  Neurological:     General: No focal deficit present.     Mental Status: He is alert and oriented to person, place, and time.     Cranial Nerves: No cranial nerve deficit.     Motor: No weakness.  Psychiatric:        Mood and Affect: Mood normal.        Behavior: Behavior normal.      No results found for any visits on 07/27/22.  Recent Results (from the past 2160 hour([place]))  Hemoglobin A1c     Status: Abnormal   Collection Time: 07/06/22  8:51 AM  Result Value Ref Range   Hgb A1c MFr Bld 5.8 (H) 4.8 - 5.6 %    Comment:          Prediabetes: 5.7 - 6.4          Diabetes: >6.4          Glycemic control for adults  with diabetes: <7.0    Est. average glucose Bld gHb Est-mCnc 120 mg/dL  TSH     Status: None  Collection Time: 07/06/22  8:51 AM  Result Value Ref Range   TSH 2.750 0.450 - 4.500 uIU/mL  CMP14+EGFR     Status: Abnormal   Collection Time: 07/06/22  8:51 AM  Result Value Ref Range   Glucose 87 70 - 99 mg/dL   BUN 12 6 - 24 mg/dL   Creatinine, Ser 3.47 0.76 - 1.27 mg/dL   eGFR 425 >95 GL/OVF/6.43   BUN/Creatinine Ratio 14 9 - 20   Sodium 140 134 - 144 mmol/L   Potassium 4.2 3.5 - 5.2 mmol/L   Chloride 101 96 - 106 mmol/L   CO2 21 20 - 29 mmol/L   Calcium 10.4 (H) 8.7 - 10.2 mg/dL   Total Protein 6.6 6.0 - 8.5 g/dL   Albumin 4.3 4.1 - 5.1 g/dL   Globulin, Total 2.3 1.5 - 4.5 g/dL   Albumin/Globulin Ratio 1.9 1.2 - 2.2   Bilirubin Total 1.0 0.0 - 1.2 mg/dL   Alkaline Phosphatase 79 44 - 121 IU/L   AST 20 0 - 40 IU/L   ALT 18 0 - 44 IU/L  Lipid panel     Status: None   Collection Time: 07/06/22  8:51 AM  Result Value Ref Range   Cholesterol, Total 147 100 - 199 mg/dL   Triglycerides 329 0 - 149 mg/dL   HDL 68 >51 mg/dL   VLDL Cholesterol Cal 21 5 - 40 mg/dL   LDL Chol Calc (NIH) 58 0 - 99 mg/dL   Chol/HDL Ratio 2.2 0.0 - 5.0 ratio    Comment:                                   T. Chol/HDL Ratio                                             Men  Women                               1/2 Avg.Risk  3.4    3.3                                   Avg.Risk  5.0    4.4                                2X Avg.Risk  9.6    7.1                                3X Avg.Risk 23.4   11.0   CBC With Differential     Status: None   Collection Time: 07/06/22  8:51 AM  Result Value Ref Range   WBC 5.8 3.4 - 10.8 x10E3/uL   RBC 5.16 4.14 - 5.80 x10E6/uL   Hemoglobin 14.8 13.0 - 17.7 g/dL   Hematocrit 88.4 16.6 - 51.0 %   MCV 90 79 - 97 fL   MCH 28.7 26.6 - 33.0 pg   MCHC 32.0 31.5 - 35.7 g/dL   RDW 06.3 01.6 - 01.0 %   Neutrophils 67 Not Estab. %  Lymphs 24 Not Estab. %   Monocytes 7 Not  Estab. %   Eos 1 Not Estab. %   Basos 1 Not Estab. %   Neutrophils Absolute 3.9 1.4 - 7.0 x10E3/uL   Lymphocytes Absolute 1.4 0.7 - 3.1 x10E3/uL   Monocytes Absolute 0.4 0.1 - 0.9 x10E3/uL   EOS (ABSOLUTE) 0.0 0.0 - 0.4 x10E3/uL   Basophils Absolute 0.0 0.0 - 0.2 x10E3/uL   Immature Granulocytes 0 Not Estab. %   Immature Grans (Abs) 0.0 0.0 - 0.1 x10E3/uL  PSA     Status: None   Collection Time: 07/06/22  8:51 AM  Result Value Ref Range   Prostate Specific Ag, Serum 0.3 0.0 - 4.0 ng/mL    Comment: Roche ECLIA methodology. According to the American Urological Association, Serum PSA should decrease and remain at undetectable levels after radical prostatectomy. The AUA defines biochemical recurrence as an initial PSA value 0.2 ng/mL or greater followed by a subsequent confirmatory PSA value 0.2 ng/mL or greater. Values obtained with different assay methods or kits cannot be used interchangeably. Results cannot be interpreted as absolute evidence of the presence or absence of malignant disease.   INR/PT     Status: Abnormal   Collection Time: 07/06/22  3:10 PM  Result Value Ref Range   INR 2.5 (H) 0.9 - 1.2    Comment: Reference interval is for non-anticoagulated patients. Suggested INR therapeutic range for Vitamin K antagonist therapy:    Standard Dose (moderate intensity                   therapeutic range):       2.0 - 3.0    Higher intensity therapeutic range       2.5 - 3.5    Prothrombin Time 25.6 (H) 9.1 - 12.0 sec      Assessment & Plan:  As per problem list   Problem List Items Addressed This Visit       Cardiovascular and Mediastinum   Essential hypertension     Endocrine   Type 2 diabetes mellitus without complication (HCC)     Other   Morbid obesity (HCC)   Anxiety   Relevant Medications   LORazepam (ATIVAN) 1 MG tablet   Other Visit Diagnoses     Seasonal allergic rhinitis due to pollen    -  Primary       Return in about 3 months (around  10/27/2022).   Total time spent: 30 minutes  Luna Fuse, MD  07/27/2022   This document may have been prepared by [facility] Voice Recognition software and as such may include unintentional dictation errors.

## 2022-07-30 DIAGNOSIS — Z6841 Body Mass Index (BMI) 40.0 and over, adult: Secondary | ICD-10-CM | POA: Diagnosis not present

## 2022-07-30 DIAGNOSIS — Z87891 Personal history of nicotine dependence: Secondary | ICD-10-CM | POA: Diagnosis not present

## 2022-07-30 DIAGNOSIS — Z9884 Bariatric surgery status: Secondary | ICD-10-CM | POA: Diagnosis not present

## 2022-07-30 DIAGNOSIS — Z48815 Encounter for surgical aftercare following surgery on the digestive system: Secondary | ICD-10-CM | POA: Diagnosis not present

## 2022-07-30 DIAGNOSIS — K912 Postsurgical malabsorption, not elsewhere classified: Secondary | ICD-10-CM | POA: Diagnosis not present

## 2022-07-30 DIAGNOSIS — Z713 Dietary counseling and surveillance: Secondary | ICD-10-CM | POA: Diagnosis not present

## 2022-07-30 DIAGNOSIS — R197 Diarrhea, unspecified: Secondary | ICD-10-CM | POA: Diagnosis not present

## 2022-07-30 DIAGNOSIS — Z79899 Other long term (current) drug therapy: Secondary | ICD-10-CM | POA: Diagnosis not present

## 2022-08-01 ENCOUNTER — Telehealth: Payer: Self-pay | Admitting: Internal Medicine

## 2022-08-01 MED ORDER — AZITHROMYCIN 250 MG PO TABS: ORAL_TABLET | ORAL | 0 refills | Status: AC

## 2022-08-01 NOTE — Telephone Encounter (Signed)
Patient called in and he is wheezing and would like you to send him in a Z-pack. Marchelle Folks agreed to send him in a Z-pack.

## 2022-08-03 ENCOUNTER — Ambulatory Visit: Payer: Medicare HMO | Admitting: Internal Medicine

## 2022-08-06 ENCOUNTER — Other Ambulatory Visit: Payer: Self-pay | Admitting: Cardiovascular Disease

## 2022-08-06 MED ORDER — WARFARIN SODIUM 2 MG PO TABS: ORAL_TABLET | ORAL | 6 refills | Status: DC

## 2022-08-06 MED ORDER — WARFARIN SODIUM 1 MG PO TABS: ORAL_TABLET | ORAL | 4 refills | Status: DC

## 2022-08-07 ENCOUNTER — Other Ambulatory Visit: Payer: Self-pay | Admitting: Internal Medicine

## 2022-08-07 MED ORDER — BUDESONIDE-FORMOTEROL FUMARATE 80-4.5 MCG/ACT IN AERO: 2.0000 | INHALATION_SPRAY | Freq: Two times a day (BID) | RESPIRATORY_TRACT | 0 refills | Status: DC

## 2022-08-25 ENCOUNTER — Other Ambulatory Visit: Payer: Self-pay | Admitting: Nurse Practitioner

## 2022-09-28 ENCOUNTER — Other Ambulatory Visit: Payer: Self-pay | Admitting: Cardiovascular Disease

## 2022-09-28 DIAGNOSIS — E785 Hyperlipidemia, unspecified: Secondary | ICD-10-CM

## 2022-09-29 ENCOUNTER — Other Ambulatory Visit: Payer: Self-pay | Admitting: Internal Medicine

## 2022-09-29 DIAGNOSIS — M25569 Pain in unspecified knee: Secondary | ICD-10-CM

## 2022-09-30 ENCOUNTER — Encounter: Payer: Self-pay | Admitting: Internal Medicine

## 2022-10-01 ENCOUNTER — Other Ambulatory Visit: Payer: Self-pay | Admitting: Cardiovascular Disease

## 2022-10-01 DIAGNOSIS — I4891 Unspecified atrial fibrillation: Secondary | ICD-10-CM

## 2022-10-02 ENCOUNTER — Other Ambulatory Visit: Payer: Medicare HMO

## 2022-10-02 DIAGNOSIS — E119 Type 2 diabetes mellitus without complications: Secondary | ICD-10-CM | POA: Diagnosis not present

## 2022-10-02 DIAGNOSIS — I10 Essential (primary) hypertension: Secondary | ICD-10-CM

## 2022-11-01 ENCOUNTER — Encounter: Payer: Self-pay | Admitting: Internal Medicine

## 2022-11-01 ENCOUNTER — Other Ambulatory Visit: Payer: Self-pay | Admitting: Cardiology

## 2022-11-01 MED ORDER — WARFARIN SODIUM 2 MG PO TABS: ORAL_TABLET | ORAL | 1 refills | Status: DC

## 2022-11-02 ENCOUNTER — Ambulatory Visit (INDEPENDENT_AMBULATORY_CARE_PROVIDER_SITE_OTHER): Payer: Medicare HMO | Admitting: Internal Medicine

## 2022-11-02 ENCOUNTER — Encounter: Payer: Self-pay | Admitting: Internal Medicine

## 2022-11-02 ENCOUNTER — Other Ambulatory Visit: Payer: Self-pay | Admitting: Internal Medicine

## 2022-11-02 DIAGNOSIS — F419 Anxiety disorder, unspecified: Secondary | ICD-10-CM

## 2022-11-02 DIAGNOSIS — I10 Essential (primary) hypertension: Secondary | ICD-10-CM

## 2022-11-02 DIAGNOSIS — E119 Type 2 diabetes mellitus without complications: Secondary | ICD-10-CM

## 2022-11-02 MED ORDER — LORAZEPAM 1 MG PO TABS
1.0000 mg | ORAL_TABLET | Freq: Two times a day (BID) | ORAL | 2 refills | Status: DC
Start: 2022-11-02 — End: 2022-11-02

## 2022-11-02 MED ORDER — LORAZEPAM 1 MG PO TABS
1.0000 mg | ORAL_TABLET | Freq: Two times a day (BID) | ORAL | 2 refills | Status: DC
Start: 2022-11-02 — End: 2023-02-01

## 2022-11-02 NOTE — Progress Notes (Signed)
Established Patient Office Visit  Subjective:  Patient ID: Russell Ray, male    DOB: May 20, 1970  Age: 52 y.o. MRN: [id number]  Chief Complaint  Patient presents with   Follow-up    No new complaints, here for lab review and medication refills. LDL and TC well controlled on lab review. Triglycerides also satisfactory and A1c remains in the prediabetic range.    No other concerns at this time.   Past Medical History:  Diagnosis Date   Anxiety    Aortic valve stenosis    Atrial fibrillation (HCC)    CHF (congestive heart failure) (HCC)    Depression    Diabetes mellitus without complication (HCC)    Hypertension     Past Surgical History:  Procedure Laterality Date   APPLICATION OF WOUND VAC     CARDIAC CATHETERIZATION Right 04/22/2015   Procedure: Right/Left Heart Cath and Coronary Angiography;  Surgeon: Laurier Nancy, MD;  Location: [facility]IVE [facility];  Service: Cardiovascular;  Laterality: Right;   CARDIAC VALVE REPLACEMENT     CARDIOVERSION N/A 05/14/2017   Procedure: CARDIOVERSION;  Surgeon: Laurier Nancy, MD;  Location: [facility] OR[facility];  Service: Cardiovascular;  Laterality: N/A;   COLONOSCOPY N/A 10/21/2020   Procedure: COLONOSCOPY;  Surgeon: Toledo, Boykin Nearing, MD;  Location: [facility] ENDOSCOPY;  Service: Gastroenterology;  Laterality: N/A;   COLONOSCOPY WITH PROPOFOL N/A 10/05/2016   Procedure: COLONOSCOPY WITH PROPOFOL;  Surgeon: Scot Jun, MD;  Location: [facility] ENDOSCOPY;  Service: Endoscopy;  Laterality: N/A;   debridement sternum     ELECTROPHYSIOLOGIC STUDY N/A 05/05/2015   Procedure: Cardioversion;  Surgeon: Laurier Nancy, MD;  Location: [facility] ORS;  Service: Cardiovascular;  Laterality: N/A;   ELECTROPHYSIOLOGIC STUDY N/A 05/17/2015   Procedure: CARDIOVERSION;  Surgeon: Laurier Nancy, MD;  Location: [facility] ORS;  Service: Cardiovascular;  Laterality: N/A;   ESOPHAGOGASTRODUODENOSCOPY (EGD) WITH PROPOFOL N/A 10/05/2016   Procedure: ESOPHAGOGASTRODUODENOSCOPY (EGD) WITH  PROPOFOL;  Surgeon: Scot Jun, MD;  Location: [facility] ENDOSCOPY;  Service: Endoscopy;  Laterality: N/A;   GREATER OMENTAL FLAP CLOSURE     HERNIA REPAIR     X3   VALVE REPLACEMENT     VENTRAL HERNIA REPAIR N/A 12/01/2016   Procedure: HERNIA REPAIR VENTRAL ADULT with mesh;  Surgeon: Leafy Ro, MD;  Location: [facility] ORS;  Service: General;  Laterality: N/A;    Social History   Socioeconomic History   Marital status: Single    Spouse name: Not on file   Number of children: Not on file   Years of education: Not on file   Highest education level: Not on file  Occupational History   Occupation: Transit Driver  Tobacco Use   Smoking status: Former    Current packs/day: 2.00    Average packs/day: 2.0 packs/day for 28.0 years (56.0 ttl pk-yrs)    Types: Cigarettes   Smokeless tobacco: Never  Vaping Use   Vaping status: Never Used  Substance and Sexual Activity   Alcohol use: No   Drug use: No   Sexual activity: Yes  Other Topics Concern   Not on file  Social History Narrative   Not on file   Social Determinants of Health   Financial Resource Strain: Not on file  Food Insecurity: Not on file  Transportation Needs: No Transportation Needs (02/10/2022)   [facility], [facility]   PRAPARE - Transportation    In the past 12 months, has lack of transportation kept you  from medical appointments or from getting medications?: No    Lack of Transportation (Non-Medical): No  Physical Activity: Not on file  Stress: Not on file  Social Connections: Unknown (07/11/2021)   Received from [facility]   Social Network    Social Network: Not on file  Intimate Partner Violence: Unknown (06/01/2021)   Received from [facility]   HITS    Physically Hurt: Not on file    Insult or Talk Down To: Not on file    Threaten Physical Harm: Not on file    Scream or Curse: Not on file    Family History  Problem Relation Age of Onset    Hypertension Mother    Hypertension Maternal Grandmother    Diabetes Maternal Grandmother     Allergies  Allergen Reactions   Lovenox [Enoxaparin Sodium] Itching    Review of Systems  Constitutional: Negative.   HENT: Negative.    Eyes: Negative.   Respiratory: Negative.    Cardiovascular: Negative.   Gastrointestinal: Negative.   Genitourinary: Negative.   Skin: Negative.   Neurological: Negative.   Endo/Heme/Allergies: Negative.        Objective:   BP (!) 141/73   Pulse 60   Ht 5\' 9"  (1.753 m)   Wt (!) 409 lb (185.5 kg)   SpO2 98%   BMI 60.40 kg/m   Vitals:   11/02/22 0936  BP: (!) 141/73  Pulse: 60  Height: 5\' 9"  (1.753 m)  Weight: (!) 409 lb (185.5 kg)  SpO2: 98%  BMI (Calculated): 60.37    Physical Exam Vitals reviewed.  Constitutional:      Appearance: Normal appearance.  HENT:     Head: Normocephalic.     Left Ear: There is no impacted cerumen.     Nose: Nose normal.     Mouth/Throat:     Mouth: Mucous membranes are moist.     Pharynx: No posterior oropharyngeal erythema.  Eyes:     Extraocular Movements: Extraocular movements intact.     Pupils: Pupils are equal, round, and reactive to light.  Cardiovascular:     Rate and Rhythm: Regular rhythm.     Chest Wall: PMI is not displaced.     Pulses: Normal pulses.     Heart sounds: Normal heart sounds. No murmur heard. Pulmonary:     Effort: Pulmonary effort is normal.     Breath sounds: Normal air entry. No rhonchi or rales.  Abdominal:     General: Abdomen is flat. Bowel sounds are normal. There is no distension.     Palpations: Abdomen is soft. There is no hepatomegaly, splenomegaly or mass.     Tenderness: There is no abdominal tenderness.  Musculoskeletal:        General: Normal range of motion.     Cervical back: Normal range of motion and neck supple.     Right lower leg: No edema.     Left lower leg: No edema.  Skin:    General: Skin is warm and dry.  Neurological:     General:  No focal deficit present.     Mental Status: He is alert and oriented to person, place, and time.     Cranial Nerves: No cranial nerve deficit.     Motor: No weakness.  Psychiatric:        Mood and Affect: Mood normal.        Behavior: Behavior normal.      No results found for any visits on 11/02/22.  Recent Results (from the past 2160 hour(Jillianne Gamino))  Comprehensive metabolic panel     Status: Abnormal   Collection Time: 10/02/22  9:41 AM  Result Value Ref Range   Glucose 91 70 - 99 mg/dL   BUN 20 6 - 24 mg/dL   Creatinine, Ser 1.61 0.76 - 1.27 mg/dL   eGFR 096 >04 VW/UJW/1.19   BUN/Creatinine Ratio 24 (H) 9 - 20   Sodium 136 134 - 144 mmol/L   Potassium 4.4 3.5 - 5.2 mmol/L   Chloride 101 96 - 106 mmol/L   CO2 22 20 - 29 mmol/L   Calcium 10.4 (H) 8.7 - 10.2 mg/dL   Total Protein 6.6 6.0 - 8.5 g/dL   Albumin 4.3 4.1 - 5.1 g/dL   Globulin, Total 2.3 1.5 - 4.5 g/dL   Bilirubin Total 1.2 0.0 - 1.2 mg/dL   Alkaline Phosphatase 68 44 - 121 IU/L   AST 21 0 - 40 IU/L   ALT 22 0 - 44 IU/L  Lipid panel     Status: None   Collection Time: 10/02/22  9:41 AM  Result Value Ref Range   Cholesterol, Total 154 100 - 199 mg/dL   Triglycerides 147 0 - 149 mg/dL   HDL 59 >82 mg/dL   VLDL Cholesterol Cal 24 5 - 40 mg/dL   LDL Chol Calc (NIH) 71 0 - 99 mg/dL   Chol/HDL Ratio 2.6 0.0 - 5.0 ratio    Comment:                                   T. Chol/HDL Ratio                                             Men  Women                               1/2 Avg.Risk  3.4    3.3                                   Avg.Risk  5.0    4.4                                2X Avg.Risk  9.6    7.1                                3X Avg.Risk 23.4   11.0   Hemoglobin A1c     Status: Abnormal   Collection Time: 10/02/22  9:41 AM  Result Value Ref Range   Hgb A1c MFr Bld 6.3 (H) 4.8 - 5.6 %    Comment:          Prediabetes: 5.7 - 6.4          Diabetes: >6.4          Glycemic control for adults with diabetes: <7.0     Est. average glucose Bld gHb Est-mCnc 134 mg/dL      Assessment & Plan:  As per problem list  Problem List Items Addressed This Visit  Cardiovascular and Mediastinum   Essential hypertension     Endocrine   Type 2 diabetes mellitus without complication (HCC)   Relevant Orders   CBC With Diff/Platelet   Lipid panel   Hemoglobin A1c     Other   Morbid obesity (HCC) - Primary   Relevant Orders   Vitamin D (25 hydroxy)   Anxiety   Relevant Medications   LORazepam (ATIVAN) 1 MG tablet    Return in about 3 months (around 02/01/2023) for cpe with labs prior.   Total time spent: 20 minutes  Luna Fuse, MD  11/02/2022   This document may have been prepared by [facility] Voice Recognition software and as such may include unintentional dictation errors.

## 2022-11-06 ENCOUNTER — Other Ambulatory Visit: Payer: Self-pay

## 2022-11-06 DIAGNOSIS — K912 Postsurgical malabsorption, not elsewhere classified: Secondary | ICD-10-CM

## 2022-11-06 DIAGNOSIS — Z9884 Bariatric surgery status: Secondary | ICD-10-CM

## 2022-11-06 DIAGNOSIS — E669 Obesity, unspecified: Secondary | ICD-10-CM

## 2022-11-23 ENCOUNTER — Other Ambulatory Visit: Payer: Self-pay | Admitting: Internal Medicine

## 2022-11-29 ENCOUNTER — Other Ambulatory Visit: Payer: Self-pay | Admitting: Internal Medicine

## 2022-11-30 ENCOUNTER — Other Ambulatory Visit: Payer: Self-pay | Admitting: Cardiovascular Disease

## 2022-11-30 DIAGNOSIS — I4891 Unspecified atrial fibrillation: Secondary | ICD-10-CM

## 2022-11-30 MED ORDER — AMIODARONE HCL 200 MG PO TABS: 200.0000 mg | ORAL_TABLET | Freq: Two times a day (BID) | ORAL | 0 refills | Status: DC

## 2022-12-06 ENCOUNTER — Other Ambulatory Visit: Payer: Self-pay | Admitting: Internal Medicine

## 2022-12-11 ENCOUNTER — Other Ambulatory Visit: Payer: Self-pay

## 2022-12-11 DIAGNOSIS — K912 Postsurgical malabsorption, not elsewhere classified: Secondary | ICD-10-CM

## 2022-12-11 DIAGNOSIS — Z9884 Bariatric surgery status: Secondary | ICD-10-CM

## 2022-12-11 DIAGNOSIS — E669 Obesity, unspecified: Secondary | ICD-10-CM

## 2022-12-17 DIAGNOSIS — H524 Presbyopia: Secondary | ICD-10-CM | POA: Diagnosis not present

## 2022-12-17 DIAGNOSIS — H52203 Unspecified astigmatism, bilateral: Secondary | ICD-10-CM | POA: Diagnosis not present

## 2022-12-17 DIAGNOSIS — I708 Atherosclerosis of other arteries: Secondary | ICD-10-CM | POA: Diagnosis not present

## 2022-12-17 DIAGNOSIS — H3509 Other intraretinal microvascular abnormalities: Secondary | ICD-10-CM | POA: Diagnosis not present

## 2022-12-17 DIAGNOSIS — H34231 Retinal artery branch occlusion, right eye: Secondary | ICD-10-CM | POA: Diagnosis not present

## 2022-12-17 DIAGNOSIS — H5213 Myopia, bilateral: Secondary | ICD-10-CM | POA: Diagnosis not present

## 2022-12-21 ENCOUNTER — Other Ambulatory Visit: Payer: Self-pay | Admitting: Cardiovascular Disease

## 2022-12-21 DIAGNOSIS — E785 Hyperlipidemia, unspecified: Secondary | ICD-10-CM

## 2022-12-24 ENCOUNTER — Other Ambulatory Visit: Payer: Self-pay | Admitting: Internal Medicine

## 2022-12-24 MED ORDER — BUDESONIDE-FORMOTEROL FUMARATE 80-4.5 MCG/ACT IN AERO: 2.0000 | INHALATION_SPRAY | Freq: Two times a day (BID) | RESPIRATORY_TRACT | 0 refills | Status: DC

## 2022-12-28 ENCOUNTER — Encounter: Payer: Self-pay | Admitting: Internal Medicine

## 2023-01-01 ENCOUNTER — Other Ambulatory Visit: Payer: Self-pay

## 2023-01-07 ENCOUNTER — Other Ambulatory Visit: Payer: Self-pay | Admitting: Cardiovascular Disease

## 2023-01-07 ENCOUNTER — Other Ambulatory Visit: Payer: Medicare HMO

## 2023-01-07 ENCOUNTER — Other Ambulatory Visit: Payer: Self-pay

## 2023-01-07 DIAGNOSIS — I10 Essential (primary) hypertension: Secondary | ICD-10-CM

## 2023-01-07 DIAGNOSIS — E119 Type 2 diabetes mellitus without complications: Secondary | ICD-10-CM

## 2023-01-07 DIAGNOSIS — I4891 Unspecified atrial fibrillation: Secondary | ICD-10-CM | POA: Diagnosis not present

## 2023-01-07 DIAGNOSIS — I4811 Longstanding persistent atrial fibrillation: Secondary | ICD-10-CM

## 2023-01-08 ENCOUNTER — Encounter: Payer: Self-pay | Admitting: Cardiovascular Disease

## 2023-01-08 LAB — PROTIME-INR
INR: 3 — ABNORMAL HIGH (ref 0.9–1.2)
Prothrombin Time: 31.2 s — ABNORMAL HIGH (ref 9.1–12.0)

## 2023-01-10 LAB — CBC WITH DIFF/PLATELET
Basophils Absolute: 0 10*3/uL (ref 0.0–0.2)
Basos: 0 %
EOS (ABSOLUTE): 0 10*3/uL (ref 0.0–0.4)
Eos: 1 %
Hematocrit: 45.2 % (ref 37.5–51.0)
Hemoglobin: 14.6 g/dL (ref 13.0–17.7)
Immature Grans (Abs): 0 10*3/uL (ref 0.0–0.1)
Immature Granulocytes: 0 %
Lymphocytes Absolute: 1.3 10*3/uL (ref 0.7–3.1)
Lymphs: 19 %
MCH: 30.3 pg (ref 26.6–33.0)
MCHC: 32.3 g/dL (ref 31.5–35.7)
MCV: 94 fL (ref 79–97)
Monocytes Absolute: 0.4 10*3/uL (ref 0.1–0.9)
Monocytes: 6 %
Neutrophils Absolute: 4.9 10*3/uL (ref 1.4–7.0)
Neutrophils: 74 %
Platelets: 192 10*3/uL (ref 150–450)
RBC: 4.82 x10E6/uL (ref 4.14–5.80)
RDW: 12.6 % (ref 11.6–15.4)
WBC: 6.7 10*3/uL (ref 3.4–10.8)

## 2023-01-10 LAB — HEMOGLOBIN A1C
Est. average glucose Bld gHb Est-mCnc: 126 mg/dL
Hgb A1c MFr Bld: 6 % — ABNORMAL HIGH (ref 4.8–5.6)

## 2023-01-10 LAB — COMPREHENSIVE METABOLIC PANEL
ALT: 17 [IU]/L (ref 0–44)
AST: 22 [IU]/L (ref 0–40)
Albumin: 4.4 g/dL (ref 3.8–4.9)
Alkaline Phosphatase: 68 [IU]/L (ref 44–121)
BUN/Creatinine Ratio: 20 (ref 9–20)
BUN: 19 mg/dL (ref 6–24)
Bilirubin Total: 0.9 mg/dL (ref 0.0–1.2)
CO2: 22 mmol/L (ref 20–29)
Calcium: 10.8 mg/dL — ABNORMAL HIGH (ref 8.7–10.2)
Chloride: 100 mmol/L (ref 96–106)
Creatinine, Ser: 0.97 mg/dL (ref 0.76–1.27)
Globulin, Total: 2.4 g/dL (ref 1.5–4.5)
Glucose: 95 mg/dL (ref 70–99)
Potassium: 4.7 mmol/L (ref 3.5–5.2)
Sodium: 138 mmol/L (ref 134–144)
Total Protein: 6.8 g/dL (ref 6.0–8.5)
eGFR: 95 mL/min/{1.73_m2} (ref >59)

## 2023-01-10 LAB — LIPID PANEL
Chol/HDL Ratio: 2.5 ratio (ref 0.0–5.0)
Cholesterol, Total: 164 mg/dL (ref 100–199)
HDL: 66 mg/dL (ref >39)
LDL Chol Calc (NIH): 78 mg/dL (ref 0–99)
Triglycerides: 110 mg/dL (ref 0–149)
VLDL Cholesterol Cal: 20 mg/dL (ref 5–40)

## 2023-01-10 LAB — PSA: Prostate Specific Ag, Serum: 0.4 ng/mL (ref 0.0–4.0)

## 2023-01-12 LAB — FERRITIN: Ferritin: 145 ng/mL (ref 30–400)

## 2023-01-12 LAB — PHOSPHORUS: Phosphorus: 2.9 mg/dL (ref 2.8–4.1)

## 2023-01-12 LAB — IRON AND TIBC
Iron Saturation: 26 % (ref 15–55)
Iron: 120 ug/dL (ref 38–169)
Total Iron Binding Capacity: 459 ug/dL — ABNORMAL HIGH (ref 250–450)
UIBC: 339 ug/dL (ref 111–343)

## 2023-01-12 LAB — VITAMIN B1: Thiamine: 112.1 nmol/L (ref 66.5–200.0)

## 2023-01-12 LAB — FOLATE RBC
Folate, Hemolysate: 286 ng/mL
Folate, RBC: 624 ng/mL (ref >498)
Hematocrit: 45.8 % (ref 37.5–51.0)

## 2023-01-12 LAB — MAGNESIUM: Magnesium: 1.5 mg/dL — ABNORMAL LOW (ref 1.6–2.3)

## 2023-01-12 LAB — PTH, INTACT AND CALCIUM
Calcium: 10.4 mg/dL — ABNORMAL HIGH (ref 8.7–10.2)
PTH: 99 pg/mL — ABNORMAL HIGH (ref 15–65)

## 2023-01-12 LAB — TRANSFERRIN: Transferrin: 364 mg/dL — ABNORMAL HIGH (ref 177–329)

## 2023-01-12 LAB — VITAMIN B12: Vitamin B-12: 272 pg/mL (ref 232–1245)

## 2023-01-16 DIAGNOSIS — Z9884 Bariatric surgery status: Secondary | ICD-10-CM | POA: Diagnosis not present

## 2023-01-16 DIAGNOSIS — I890 Lymphedema, not elsewhere classified: Secondary | ICD-10-CM | POA: Diagnosis not present

## 2023-01-16 DIAGNOSIS — N4883 Acquired buried penis: Secondary | ICD-10-CM | POA: Diagnosis not present

## 2023-02-01 ENCOUNTER — Ambulatory Visit (INDEPENDENT_AMBULATORY_CARE_PROVIDER_SITE_OTHER): Payer: Medicare HMO | Admitting: Internal Medicine

## 2023-02-01 ENCOUNTER — Encounter: Payer: Self-pay | Admitting: Internal Medicine

## 2023-02-01 VITALS — BP 142/87 | HR 47 | Ht 69.0 in | Wt >= 6400 oz

## 2023-02-01 DIAGNOSIS — E119 Type 2 diabetes mellitus without complications: Secondary | ICD-10-CM | POA: Diagnosis not present

## 2023-02-01 DIAGNOSIS — E65 Localized adiposity: Secondary | ICD-10-CM | POA: Diagnosis not present

## 2023-02-01 DIAGNOSIS — F419 Anxiety disorder, unspecified: Secondary | ICD-10-CM

## 2023-02-01 DIAGNOSIS — Z1331 Encounter for screening for depression: Secondary | ICD-10-CM | POA: Diagnosis not present

## 2023-02-01 DIAGNOSIS — E669 Obesity, unspecified: Secondary | ICD-10-CM

## 2023-02-01 DIAGNOSIS — J208 Acute bronchitis due to other specified organisms: Secondary | ICD-10-CM | POA: Diagnosis not present

## 2023-02-01 DIAGNOSIS — Z0001 Encounter for general adult medical examination with abnormal findings: Secondary | ICD-10-CM | POA: Diagnosis not present

## 2023-02-01 DIAGNOSIS — E213 Hyperparathyroidism, unspecified: Secondary | ICD-10-CM | POA: Insufficient documentation

## 2023-02-01 DIAGNOSIS — M62838 Other muscle spasm: Secondary | ICD-10-CM

## 2023-02-01 LAB — POCT CBG (FASTING - GLUCOSE)-MANUAL ENTRY: Glucose Fasting, POC: 103 mg/dL — AB (ref 70–99)

## 2023-02-01 MED ORDER — LORAZEPAM 1 MG PO TABS: 1.0000 mg | ORAL_TABLET | Freq: Two times a day (BID) | ORAL | 2 refills | Status: DC

## 2023-02-01 MED ORDER — CYCLOBENZAPRINE HCL 10 MG PO TABS: 10.0000 mg | ORAL_TABLET | Freq: Three times a day (TID) | ORAL | 0 refills | Status: AC | PRN

## 2023-02-01 MED ORDER — DOXYCYCLINE HYCLATE 100 MG PO TABS: 100.0000 mg | ORAL_TABLET | Freq: Two times a day (BID) | ORAL | 0 refills | Status: DC

## 2023-02-01 MED ORDER — MOUNJARO 2.5 MG/0.5ML ~~LOC~~ SOAJ: 2.5000 mg | SUBCUTANEOUS | 1 refills | Status: DC

## 2023-02-01 MED ORDER — BUDESONIDE-FORMOTEROL FUMARATE 80-4.5 MCG/ACT IN AERO
2.0000 | INHALATION_SPRAY | Freq: Two times a day (BID) | RESPIRATORY_TRACT | 2 refills | Status: DC
Start: 2023-02-01 — End: 2023-08-27

## 2023-02-01 NOTE — Progress Notes (Signed)
Established Patient Office Visit  Subjective:  Patient ID: Russell Ray, male    DOB: [birth year]  Age: 52 y.o. MRN: [id number]  Chief Complaint  Patient presents with   Follow-up    Pt stated he doesn't need to a CPE... didn't want to undress..    No new complaints, here for CPE, lab review and medication refills. Labs reviewed and notable for elevated PTH, Ca.  Diabetes well controlled and lipids at target.    No other concerns at this time.   Past Medical History:  Diagnosis Date   Anxiety    Aortic valve stenosis    Atrial fibrillation (HCC)    CHF (congestive heart failure) (HCC)    Depression    Diabetes mellitus without complication (HCC)    Hypertension     Past Surgical History:  Procedure Laterality Date   APPLICATION OF WOUND VAC     CARDIAC CATHETERIZATION Right 04/22/2015   Procedure: Right/Left Heart Cath and Coronary Angiography;  Surgeon: Laurier Nancy, MD;  Location: [facility] INVASIVE CV LAB;  Service: Cardiovascular;  Laterality: Right;   CARDIAC VALVE REPLACEMENT     CARDIOVERSION N/A 05/14/2017   Procedure: CARDIOVERSION;  Surgeon: Laurier Nancy, MD;  Location: [facility] ORS;  Service: Cardiovascular;  Laterality: N/A;   COLONOSCOPY N/A 10/21/2020   Procedure: COLONOSCOPY;  Surgeon: Toledo, Boykin Nearing, MD;  Location: [facility] ENDOSCOPY;  Service: Gastroenterology;  Laterality: N/A;   COLONOSCOPY WITH PROPOFOL N/A 10/05/2016   Procedure: COLONOSCOPY WITH PROPOFOL;  Surgeon: Scot Jun, MD;  Location: [facility] ENDOSCOPY;  Service: Endoscopy;  Laterality: N/A;   debridement sternum     ELECTROPHYSIOLOGIC STUDY N/A 05/05/2015   Procedure: Cardioversion;  Surgeon: Laurier Nancy, MD;  Location: [facility] ORS;  Service: Cardiovascular;  Laterality: N/A;   ELECTROPHYSIOLOGIC STUDY N/A 05/17/2015   Procedure: CARDIOVERSION;  Surgeon: Laurier Nancy, MD;  Location: [facility] ORS;  Service: Cardiovascular;  Laterality: N/A;   ESOPHAGOGASTRODUODENOSCOPY (EGD) WITH PROPOFOL N/A 10/05/2016    Procedure: ESOPHAGOGASTRODUODENOSCOPY (EGD) WITH PROPOFOL;  Surgeon: Scot Jun, MD;  Location: [facility] ENDOSCOPY;  Service: Endoscopy;  Laterality: N/A;   GREATER OMENTAL FLAP CLOSURE     HERNIA REPAIR     X3   VALVE REPLACEMENT     VENTRAL HERNIA REPAIR N/A 12/01/2016   Procedure: HERNIA REPAIR VENTRAL ADULT with mesh;  Surgeon: Leafy Ro, MD;  Location: [facility] ORS;  Service: General;  Laterality: N/A;    Social History   Socioeconomic History   Marital status: Single    Spouse name: Not on file   Number of children: Not on file   Years of education: Not on file   Highest education level: Not on file  Occupational History   Occupation: Transit Driver  Tobacco Use   Smoking status: Former    Current packs/day: 2.00    Average packs/day: 2.0 packs/day for 28.0 years (56.0 ttl pk-yrs)    Types: Cigarettes   Smokeless tobacco: Never  Vaping Use   Vaping status: Never Used  Substance and Sexual Activity   Alcohol use: No   Drug use: No   Sexual activity: Yes  Other Topics Concern   Not on file  Social History Narrative   Not on file   Social Determinants of Health   Financial Resource Strain: Not on file  Food Insecurity: Not on file  Transportation Needs: No Transportation Needs (02/10/2022)   Received from [facility], [facility]   [facility] - Transportation  In the past 12 months, has lack of transportation kept you from medical appointments or from getting medications?: No    Lack of Transportation (Non-Medical): No  Physical Activity: Not on file  Stress: Not on file  Social Connections: Unknown (07/11/2021)   Received from [facility], [facility]   Social Network    Social Network: Not on file  Intimate Partner Violence: Unknown (06/01/2021)   Received from [facility], [facility]   HITS    Physically Hurt: Not on file    Insult or Talk Down To: Not on file    Threaten Physical Harm: Not on file    Scream  or Curse: Not on file    Family History  Problem Relation Age of Onset   Hypertension Mother    Hypertension Maternal Grandmother    Diabetes Maternal Grandmother     Allergies  Allergen Reactions   Lovenox [Enoxaparin Sodium] Itching    Outpatient Medications Prior to Visit  Medication Sig   acetaminophen (TYLENOL) 325 MG tablet Take 650 mg by mouth every 4 (four) hours as needed for moderate pain.   amiodarone (PACERONE) 200 MG tablet Take 1 tablet (200 mg total) by mouth 2 (two) times daily.   celecoxib (CELEBREX) 400 MG capsule TAKE 1 CAPSULE BY MOUTH EVERY DAY IN THE MORNING   cetirizine (ZYRTEC ALLERGY) 10 MG tablet Take 1 tablet (10 mg total) by mouth daily.   cyanocobalamin 1000 MCG tablet Take 1,000 mcg by mouth daily. (Patient not taking: Reported on 05/04/2022)   fluticasone (FLONASE) 50 MCG/ACT nasal spray Place 1 spray into both nostrils daily.   Iron, Ferrous Sulfate, 142 (45 Fe) MG TBCR Take 1 tablet by mouth daily. (Patient not taking: Reported on 05/04/2022)   lisinopril (ZESTRIL) 20 MG tablet TAKE 1 TABLET BY MOUTH EVERY DAY   magnesium oxide (MAG-OX) 400 MG tablet TAKE 2 TABLETS BY MOUTH TWICE A DAY   Magnesium Oxide 400 (240 Mg) MG TABS Take 1 tablet (400 mg total) by mouth every morning.   metoprolol succinate (TOPROL-XL) 50 MG 24 hr tablet TAKE 1 TABLET BY MOUTH EVERY DAY   ondansetron (ZOFRAN-ODT) 4 MG disintegrating tablet Take 1 tablet (4 mg total) by mouth every 8 (eight) hours as needed.   pioglitazone (ACTOS) 45 MG tablet Take 45 mg by mouth daily. (Patient not taking: Reported on 05/04/2022)   rosuvastatin (CRESTOR) 20 MG tablet TAKE 1 TABLET BY MOUTH EVERY DAY IN THE EVENING   warfarin (COUMADIN) 1 MG tablet Take 1 tablet daily or as directed   warfarin (COUMADIN) 2 MG tablet Take 1 tablet daily or as directed   warfarin (COUMADIN) 3 MG tablet Take 3 mg by mouth as directed. Takes 3 mg all days except Mondays and Thursdays (Patient not taking: Reported on  07/27/2022)   [DISCONTINUED] budesonide-formoterol (SYMBICORT) 80-4.5 MCG/ACT inhaler Inhale 2 puffs into the lungs 2 (two) times daily.   [DISCONTINUED] cyclobenzaprine (FLEXERIL) 10 MG tablet Take 10 mg by mouth 3 (three) times daily as needed.   [DISCONTINUED] LORazepam (ATIVAN) 1 MG tablet Take 1 tablet (1 mg total) by mouth 2 (two) times daily.   No facility-administered medications prior to visit.    Review of Systems  Constitutional: Negative.   HENT: Negative.    Eyes: Negative.   Respiratory: Negative.    Cardiovascular: Negative.   Gastrointestinal: Negative.   Genitourinary: Negative.   Skin: Negative.   Neurological: Negative.   Endo/Heme/Allergies: Negative.  Objective:   BP (!) 142/87   Pulse (!) 47   Ht 5\' 9"  (1.753 m)   Wt (!) 410 lb (186 kg)   SpO2 98%   BMI 60.55 kg/m   Vitals:   02/01/23 0951  BP: (!) 142/87  Pulse: (!) 47  Height: 5\' 9"  (1.753 m)  Weight: (!) 410 lb (186 kg)  SpO2: 98%  BMI (Calculated): 60.52    Physical Exam Vitals reviewed.  Constitutional:      Appearance: Normal appearance.  HENT:     Head: Normocephalic.     Left Ear: There is no impacted cerumen.     Nose: Nose normal.     Mouth/Throat:     Mouth: Mucous membranes are moist.     Pharynx: No posterior oropharyngeal erythema.  Eyes:     Extraocular Movements: Extraocular movements intact.     Pupils: Pupils are equal, round, and reactive to light.  Cardiovascular:     Rate and Rhythm: Regular rhythm.     Chest Wall: PMI is not displaced.     Pulses: Normal pulses.     Heart sounds: Normal heart sounds. No murmur heard. Pulmonary:     Effort: Pulmonary effort is normal.     Breath sounds: Normal air entry. No rhonchi or rales.  Abdominal:     General: Abdomen is flat. Bowel sounds are normal. There is no distension.     Palpations: Abdomen is soft. There is no hepatomegaly, splenomegaly or mass.     Tenderness: There is no abdominal tenderness.      Comments: Large pannus  Musculoskeletal:        General: Normal range of motion.     Cervical back: Normal range of motion and neck supple.     Right lower leg: No edema.     Left lower leg: No edema.  Skin:    General: Skin is warm and dry.  Neurological:     General: No focal deficit present.     Mental Status: He is alert and oriented to person, place, and time.     Cranial Nerves: No cranial nerve deficit.     Motor: No weakness.  Psychiatric:        Mood and Affect: Mood normal.        Behavior: Behavior normal.      Results for orders placed or performed in visit on 02/01/23  POCT CBG (Fasting - Glucose)  Result Value Ref Range   Glucose Fasting, POC 103 (A) 70 - 99 mg/dL    Recent Results (from the past 2160 hour([facility] Mastrop[facility]))  Hemoglobin A1c     Status: None   Collection Time: 01/07/23 10:48 AM  Result Value Ref Range   Hgb A1c MFr Bld CANCELED %    Comment: Please refer to the following specimen for additional lab results. see [id number]          Prediabetes: 5.7 - 6.4          Diabetes: >6.4          Glycemic control for adults with diabetes: <7.0  Result canceled by the ancillary.   Lipid panel     Status: None   Collection Time: 01/07/23 10:48 AM  Result Value Ref Range   Cholesterol, Total CANCELED mg/dL    Comment: Please refer to the following specimen for additional lab results. see [id number]  Result canceled by the ancillary.    Triglycerides CANCELED     Comment: Test not performed  Result canceled by the ancillary.    HDL CANCELED     Comment: Test not performed  Result canceled by the ancillary.    VLDL Cholesterol Cal CANCELED mg/dL    Comment: Unable to calculate result since non-numeric result obtained for component test.  Result canceled by the ancillary.   CBC With Diff/Platelet     Status: None   Collection Time: 01/07/23 10:48 AM  Result Value Ref Range   WBC CANCELED x10E3/uL    Comment: Please refer to the following specimen  for additional lab results. see [id number]  Result canceled by the ancillary.    RBC CANCELED     Comment: Test not performed  Result canceled by the ancillary.    Hemoglobin CANCELED     Comment: Test not performed  Result canceled by the ancillary.    Hematocrit CANCELED     Comment: Test not performed  Result canceled by the ancillary.    Platelets CANCELED     Comment: Test not performed  Result canceled by the ancillary.    Neutrophils CANCELED     Comment: Test not performed  Result canceled by the ancillary.    Lymphs CANCELED     Comment: Test not performed  Result canceled by the ancillary.    Monocytes CANCELED     Comment: Test not performed  Result canceled by the ancillary.    Eos CANCELED     Comment: Test not performed  Result canceled by the ancillary.    Lymphocytes Absolute CANCELED     Comment: Test not performed  Result canceled by the ancillary.    EOS (ABSOLUTE) CANCELED     Comment: Test not performed  Result canceled by the ancillary.    Basophils Absolute CANCELED     Comment: Test not performed  Result canceled by the ancillary.   Comprehensive metabolic panel     Status: None   Collection Time: 01/07/23 10:48 AM  Result Value Ref Range   Glucose CANCELED mg/dL    Comment: Please refer to the following specimen for additional lab results. see [id number]  Result canceled by the ancillary.    BUN CANCELED     Comment: Test not performed  Result canceled by the ancillary.    Creatinine, Ser CANCELED     Comment: Test not performed  Result canceled by the ancillary.    Sodium CANCELED     Comment: Test not performed  Result canceled by the ancillary.    Potassium CANCELED     Comment: Test not performed  Result canceled by the ancillary.    Chloride CANCELED     Comment: Test not performed  Result canceled by the ancillary.    CO2 CANCELED     Comment: Test not performed  Result canceled by the  ancillary.    Calcium CANCELED     Comment: Test not performed  Result canceled by the ancillary.    Total Protein CANCELED     Comment: Test not performed  Result canceled by the ancillary.    Albumin CANCELED     Comment: Test not performed  Result canceled by the ancillary.    Bilirubin Total CANCELED     Comment: Test not performed  Result canceled by the ancillary.    Alkaline Phosphatase CANCELED     Comment: Test not performed  Result canceled by the ancillary.    AST CANCELED     Comment: Test not performed  Result canceled by the ancillary.  ALT CANCELED     Comment: Test not performed  Result canceled by the ancillary.   PSA     Status: None   Collection Time: 01/07/23 10:48 AM  Result Value Ref Range   Prostate Specific Ag, Serum CANCELED ng/mL    Comment: Please refer to the following specimen for additional lab results. see [id number] Roche ECLIA methodology. According to the American Urological Association, Serum PSA should decrease and remain at undetectable levels after radical prostatectomy. The AUA defines biochemical recurrence as an initial PSA value 0.2 ng/mL or greater followed by a subsequent confirmatory PSA value 0.2 ng/mL or greater. Values obtained with different assay methods or kits cannot be used interchangeably. Results cannot be interpreted as absolute evidence of the presence or absence of malignant disease.  Result canceled by the ancillary.   Comprehensive metabolic panel     Status: Abnormal   Collection Time: 01/07/23  2:22 PM  Result Value Ref Range   Glucose 95 70 - 99 mg/dL   BUN 19 6 - 24 mg/dL   Creatinine, Ser 1.30 0.76 - 1.27 mg/dL   eGFR 95 >86 VH/QIO/9.62   BUN/Creatinine Ratio 20 9 - 20   Sodium 138 134 - 144 mmol/L   Potassium 4.7 3.5 - 5.2 mmol/L   Chloride 100 96 - 106 mmol/L   CO2 22 20 - 29 mmol/L   Calcium 10.8 (H) 8.7 - 10.2 mg/dL   Total Protein 6.8 6.0 - 8.5 g/dL   Albumin 4.4 3.8 - 4.9  g/dL   Globulin, Total 2.4 1.5 - 4.5 g/dL   Bilirubin Total 0.9 0.0 - 1.2 mg/dL   Alkaline Phosphatase 68 44 - 121 IU/L   AST 22 0 - 40 IU/L   ALT 17 0 - 44 IU/L  Lipid panel     Status: None   Collection Time: 01/07/23  2:22 PM  Result Value Ref Range   Cholesterol, Total 164 100 - 199 mg/dL   Triglycerides 952 0 - 149 mg/dL   HDL 66 >84 mg/dL   VLDL Cholesterol Cal 20 5 - 40 mg/dL   LDL Chol Calc (NIH) 78 0 - 99 mg/dL   Chol/HDL Ratio 2.5 0.0 - 5.0 ratio    Comment:                                   T. Chol/HDL Ratio                                             Men  Women                               1/2 Avg.Risk  3.4    3.3                                   Avg.Risk  5.0    4.4                                2X Avg.Risk  9.6    7.1  3X Avg.Risk 23.4   11.0   Hemoglobin A1c     Status: Abnormal   Collection Time: 01/07/23  2:22 PM  Result Value Ref Range   Hgb A1c MFr Bld 6.0 (H) 4.8 - 5.6 %    Comment:          Prediabetes: 5.7 - 6.4          Diabetes: >6.4          Glycemic control for adults with diabetes: <7.0    Est. average glucose Bld gHb Est-mCnc 126 mg/dL  CBC With Diff/Platelet     Status: None   Collection Time: 01/07/23  2:22 PM  Result Value Ref Range   WBC 6.7 3.4 - 10.8 x10E3/uL   RBC 4.82 4.14 - 5.80 x10E6/uL   Hemoglobin 14.6 13.0 - 17.7 g/dL   Hematocrit 29.5 62.1 - 51.0 %   MCV 94 79 - 97 fL   MCH 30.3 26.6 - 33.0 pg   MCHC 32.3 31.5 - 35.7 g/dL   RDW 30.8 65.7 - 84.6 %   Platelets 192 150 - 450 x10E3/uL   Neutrophils 74 Not Estab. %   Lymphs 19 Not Estab. %   Monocytes 6 Not Estab. %   Eos 1 Not Estab. %   Basos 0 Not Estab. %   Neutrophils Absolute 4.9 1.4 - 7.0 x10E3/uL   Lymphocytes Absolute 1.3 0.7 - 3.1 x10E3/uL   Monocytes Absolute 0.4 0.1 - 0.9 x10E3/uL   EOS (ABSOLUTE) 0.0 0.0 - 0.4 x10E3/uL   Basophils Absolute 0.0 0.0 - 0.2 x10E3/uL   Immature Granulocytes 0 Not Estab. %   Immature Grans (Abs) 0.0 0.0  - 0.1 x10E3/uL  PSA     Status: None   Collection Time: 01/07/23  2:22 PM  Result Value Ref Range   Prostate Specific Ag, Serum 0.4 0.0 - 4.0 ng/mL    Comment: Roche ECLIA methodology. According to the American Urological Association, Serum PSA should decrease and remain at undetectable levels after radical prostatectomy. The AUA defines biochemical recurrence as an initial PSA value 0.2 ng/mL or greater followed by a subsequent confirmatory PSA value 0.2 ng/mL or greater. Values obtained with different assay methods or kits cannot be used interchangeably. Results cannot be interpreted as absolute evidence of the presence or absence of malignant disease.   Magnesium     Status: Abnormal   Collection Time: 01/07/23  2:42 PM  Result Value Ref Range   Magnesium 1.5 (L) 1.6 - 2.3 mg/dL  Phosphorus     Status: None   Collection Time: 01/07/23  2:42 PM  Result Value Ref Range   Phosphorus 2.9 2.8 - 4.1 mg/dL  Iron and IBC ([id number])     Status: Abnormal   Collection Time: 01/07/23  2:42 PM  Result Value Ref Range   Total Iron Binding Capacity 459 (H) 250 - 450 ug/dL   UIBC 010 272 - 536 ug/dL   Iron 644 38 - 034 ug/dL   Iron Saturation 26 15 - 55 %  Ferritin     Status: None   Collection Time: 01/07/23  2:42 PM  Result Value Ref Range   Ferritin 145 30 - 400 ng/mL  Transferrin     Status: Abnormal   Collection Time: 01/07/23  2:42 PM  Result Value Ref Range   Transferrin 364 (H) 177 - 329 mg/dL  Vitamin V42     Status: None   Collection Time: 01/07/23  2:42 PM  Result Value Ref  Range   Vitamin B-12 272 232 - 1,245 pg/mL  Vitamin B1     Status: None   Collection Time: 01/07/23  2:42 PM  Result Value Ref Range   Thiamine 112.1 66.5 - 200.0 nmol/L  RBC Folate     Status: None   Collection Time: 01/07/23  2:42 PM  Result Value Ref Range   Folate, Hemolysate 286.0 Not Estab. ng/mL   Hematocrit 45.8 37.5 - 51.0 %   Folate, RBC 624 >498 ng/mL  Parathyroid Hormone, Intact  w/Ca     Status: Abnormal   Collection Time: 01/07/23  2:42 PM  Result Value Ref Range   Calcium 10.4 (H) 8.7 - 10.2 mg/dL   PTH 99 (H) 15 - 65 pg/mL   PTH Interp Comment     Comment: Interpretation                 Intact PTH    Calcium                                 (pg/mL)      (mg/dL) Normal                          15 - 65     8.6 - 10.2 Primary Hyperparathyroidism         >65          >10.2 Secondary Hyperparathyroidism       >65          <10.2 Non-Parathyroid Hypercalcemia       <65          >10.2 Hypoparathyroidism                  <15          < 8.6 Non-Parathyroid Hypocalcemia    15 - 65          < 8.6   Protime-INR     Status: Abnormal   Collection Time: 01/07/23  3:07 PM  Result Value Ref Range   INR 3.0 (H) 0.9 - 1.2    Comment: Reference interval is for non-anticoagulated patients. Suggested INR therapeutic range for Vitamin K antagonist therapy:    Standard Dose (moderate intensity                   therapeutic range):       2.0 - 3.0    Higher intensity therapeutic range       2.5 - 3.5    Prothrombin Time 31.2 (H) 9.1 - 12.0 sec  POCT CBG (Fasting - Glucose)     Status: Abnormal   Collection Time: 02/01/23  9:54 AM  Result Value Ref Range   Glucose Fasting, POC 103 (A) 70 - 99 mg/dL      Assessment & Plan:  As per problem list  Problem List Items Addressed This Visit       Endocrine   Type 2 diabetes mellitus without complication (HCC) - Primary   Relevant Medications   tirzepatide (MOUNJARO) 2.5 MG/0.5ML Pen   Other Relevant Orders   POCT CBG (Fasting - Glucose) (Completed)   Hyperparathyroidism (HCC)   Relevant Orders   DG Bone Density   US THYROID   US Renal     Other   Obesity   Relevant Medications   tirzepatide (MOUNJARO) 2.5 MG/0.5ML Pen   Anxiety   Relevant  Medications   LORazepam (ATIVAN) 1 MG tablet   Pannus, abdominal   Relevant Orders   Ambulatory referral to Physical Therapy   Other Visit Diagnoses     Acute bronchitis due  to other specified organisms       Relevant Medications   budesonide-formoterol (SYMBICORT) 80-4.5 MCG/ACT inhaler   doxycycline (VIBRA-TABS) 100 MG tablet   Muscle spasm       Relevant Medications   cyclobenzaprine (FLEXERIL) 10 MG tablet       Return in about 4 weeks (around 03/01/2023) for Weight management, Imaging results.   Total time spent: 30 minutes  Luna Fuse, MD  02/01/2023   This document may have been prepared by [facility] Voice Recognition software and as such may include unintentional dictation errors.

## 2023-02-06 ENCOUNTER — Other Ambulatory Visit: Payer: Medicare HMO

## 2023-02-06 ENCOUNTER — Ambulatory Visit (INDEPENDENT_AMBULATORY_CARE_PROVIDER_SITE_OTHER): Payer: Medicare HMO

## 2023-02-06 DIAGNOSIS — E213 Hyperparathyroidism, unspecified: Secondary | ICD-10-CM

## 2023-02-08 ENCOUNTER — Other Ambulatory Visit: Payer: Self-pay

## 2023-02-08 DIAGNOSIS — M25569 Pain in unspecified knee: Secondary | ICD-10-CM

## 2023-02-08 MED ORDER — CELECOXIB 400 MG PO CAPS: 400.0000 mg | ORAL_CAPSULE | Freq: Every day | ORAL | 4 refills | Status: DC

## 2023-02-13 ENCOUNTER — Ambulatory Visit (INDEPENDENT_AMBULATORY_CARE_PROVIDER_SITE_OTHER): Payer: Medicaid Other

## 2023-02-13 ENCOUNTER — Other Ambulatory Visit: Payer: No Typology Code available for payment source

## 2023-02-13 ENCOUNTER — Other Ambulatory Visit: Payer: Self-pay | Admitting: Internal Medicine

## 2023-02-13 DIAGNOSIS — E213 Hyperparathyroidism, unspecified: Secondary | ICD-10-CM

## 2023-02-13 DIAGNOSIS — M816 Localized osteoporosis [Lequesne]: Secondary | ICD-10-CM

## 2023-02-13 MED ORDER — IBANDRONATE SODIUM 150 MG PO TABS: 150.0000 mg | ORAL_TABLET | ORAL | 11 refills | Status: DC

## 2023-02-18 ENCOUNTER — Encounter: Payer: Self-pay | Admitting: Internal Medicine

## 2023-02-18 NOTE — Progress Notes (Signed)
Patient notified

## 2023-02-25 ENCOUNTER — Other Ambulatory Visit: Payer: Self-pay | Admitting: Cardiovascular Disease

## 2023-03-05 ENCOUNTER — Ambulatory Visit: Payer: Medicare HMO | Admitting: Internal Medicine

## 2023-03-23 ENCOUNTER — Other Ambulatory Visit: Payer: Self-pay | Admitting: Cardiovascular Disease

## 2023-03-23 DIAGNOSIS — I4891 Unspecified atrial fibrillation: Secondary | ICD-10-CM

## 2023-03-23 DIAGNOSIS — E785 Hyperlipidemia, unspecified: Secondary | ICD-10-CM

## 2023-04-06 ENCOUNTER — Other Ambulatory Visit: Payer: Self-pay | Admitting: Cardiology

## 2023-04-11 ENCOUNTER — Other Ambulatory Visit: Payer: Self-pay | Admitting: Cardiovascular Disease

## 2023-04-11 ENCOUNTER — Other Ambulatory Visit: Payer: Medicare HMO

## 2023-04-11 DIAGNOSIS — I10 Essential (primary) hypertension: Secondary | ICD-10-CM | POA: Diagnosis not present

## 2023-04-11 DIAGNOSIS — E119 Type 2 diabetes mellitus without complications: Secondary | ICD-10-CM

## 2023-04-11 DIAGNOSIS — I4891 Unspecified atrial fibrillation: Secondary | ICD-10-CM | POA: Diagnosis not present

## 2023-04-12 ENCOUNTER — Encounter: Payer: Self-pay | Admitting: Internal Medicine

## 2023-04-12 LAB — COMPREHENSIVE METABOLIC PANEL
ALT: 12 [IU]/L (ref 0–44)
AST: 17 [IU]/L (ref 0–40)
Albumin: 4.2 g/dL (ref 3.8–4.9)
Alkaline Phosphatase: 69 [IU]/L (ref 44–121)
BUN/Creatinine Ratio: 18 (ref 9–20)
BUN: 18 mg/dL (ref 6–24)
Bilirubin Total: 0.8 mg/dL (ref 0.0–1.2)
CO2: 20 mmol/L (ref 20–29)
Calcium: 10.2 mg/dL (ref 8.7–10.2)
Chloride: 101 mmol/L (ref 96–106)
Creatinine, Ser: 1.01 mg/dL (ref 0.76–1.27)
Globulin, Total: 2.3 g/dL (ref 1.5–4.5)
Glucose: 92 mg/dL (ref 70–99)
Potassium: 4.5 mmol/L (ref 3.5–5.2)
Sodium: 139 mmol/L (ref 134–144)
Total Protein: 6.5 g/dL (ref 6.0–8.5)
eGFR: 90 mL/min/{1.73_m2} (ref >59)

## 2023-04-12 LAB — PROTIME-INR
INR: 1 (ref 0.9–1.2)
Prothrombin Time: 11.4 s (ref 9.1–12.0)

## 2023-04-12 LAB — HEMOGLOBIN A1C
Est. average glucose Bld gHb Est-mCnc: 134 mg/dL
Hgb A1c MFr Bld: 6.3 % — ABNORMAL HIGH (ref 4.8–5.6)

## 2023-04-12 LAB — LIPID PANEL
Chol/HDL Ratio: 2.4 {ratio} (ref 0.0–5.0)
Cholesterol, Total: 144 mg/dL (ref 100–199)
HDL: 60 mg/dL (ref >39)
LDL Chol Calc (NIH): 67 mg/dL (ref 0–99)
Triglycerides: 93 mg/dL (ref 0–149)
VLDL Cholesterol Cal: 17 mg/dL (ref 5–40)

## 2023-04-24 ENCOUNTER — Encounter: Payer: Self-pay | Admitting: Internal Medicine

## 2023-04-25 ENCOUNTER — Telehealth: Payer: Self-pay

## 2023-04-25 ENCOUNTER — Other Ambulatory Visit: Payer: Self-pay

## 2023-04-25 DIAGNOSIS — F419 Anxiety disorder, unspecified: Secondary | ICD-10-CM

## 2023-04-25 DIAGNOSIS — I890 Lymphedema, not elsewhere classified: Secondary | ICD-10-CM | POA: Diagnosis not present

## 2023-04-25 DIAGNOSIS — L905 Scar conditions and fibrosis of skin: Secondary | ICD-10-CM | POA: Diagnosis not present

## 2023-04-25 NOTE — Telephone Encounter (Signed)
 [facility] called after pts eval today and stated that he is needing a referral for lipedema PT- please advise  There Fax [phone]

## 2023-04-26 ENCOUNTER — Other Ambulatory Visit: Payer: Self-pay

## 2023-04-26 DIAGNOSIS — F419 Anxiety disorder, unspecified: Secondary | ICD-10-CM

## 2023-04-26 MED ORDER — LORAZEPAM 1 MG PO TABS: 1.0000 mg | ORAL_TABLET | Freq: Two times a day (BID) | ORAL | 3 refills | Status: DC

## 2023-04-29 ENCOUNTER — Other Ambulatory Visit: Payer: Self-pay | Admitting: Cardiology

## 2023-04-29 DIAGNOSIS — Z7901 Long term (current) use of anticoagulants: Secondary | ICD-10-CM

## 2023-04-29 DIAGNOSIS — Z952 Presence of prosthetic heart valve: Secondary | ICD-10-CM

## 2023-04-29 DIAGNOSIS — I4811 Longstanding persistent atrial fibrillation: Secondary | ICD-10-CM

## 2023-05-02 ENCOUNTER — Other Ambulatory Visit: Payer: Self-pay

## 2023-05-02 DIAGNOSIS — Z7901 Long term (current) use of anticoagulants: Secondary | ICD-10-CM

## 2023-05-02 DIAGNOSIS — Z952 Presence of prosthetic heart valve: Secondary | ICD-10-CM

## 2023-05-02 DIAGNOSIS — I4811 Longstanding persistent atrial fibrillation: Secondary | ICD-10-CM

## 2023-05-03 ENCOUNTER — Encounter: Payer: Self-pay | Admitting: Internal Medicine

## 2023-05-03 ENCOUNTER — Other Ambulatory Visit: Payer: Self-pay | Admitting: Cardiovascular Disease

## 2023-05-03 ENCOUNTER — Ambulatory Visit (INDEPENDENT_AMBULATORY_CARE_PROVIDER_SITE_OTHER): Payer: Medicare HMO | Admitting: Internal Medicine

## 2023-05-03 ENCOUNTER — Other Ambulatory Visit: Payer: Self-pay

## 2023-05-03 VITALS — BP 132/83 | HR 62 | Ht 71.0 in | Wt >= 6400 oz

## 2023-05-03 DIAGNOSIS — J301 Allergic rhinitis due to pollen: Secondary | ICD-10-CM

## 2023-05-03 DIAGNOSIS — I10 Essential (primary) hypertension: Secondary | ICD-10-CM | POA: Diagnosis not present

## 2023-05-03 DIAGNOSIS — E119 Type 2 diabetes mellitus without complications: Secondary | ICD-10-CM

## 2023-05-03 DIAGNOSIS — J208 Acute bronchitis due to other specified organisms: Secondary | ICD-10-CM

## 2023-05-03 DIAGNOSIS — I4891 Unspecified atrial fibrillation: Secondary | ICD-10-CM | POA: Diagnosis not present

## 2023-05-03 LAB — POCT CBG (FASTING - GLUCOSE)-MANUAL ENTRY: Glucose Fasting, POC: 99 mg/dL (ref 70–99)

## 2023-05-03 MED ORDER — LISINOPRIL 20 MG PO TABS: 20.0000 mg | ORAL_TABLET | Freq: Every day | ORAL | 1 refills | Status: DC

## 2023-05-03 MED ORDER — CETIRIZINE HCL 10 MG PO TABS: 10.0000 mg | ORAL_TABLET | Freq: Every day | ORAL | 2 refills | Status: DC

## 2023-05-03 MED ORDER — DOXYCYCLINE HYCLATE 100 MG PO TABS: 100.0000 mg | ORAL_TABLET | Freq: Two times a day (BID) | ORAL | 0 refills | Status: DC

## 2023-05-03 MED ORDER — FLUTICASONE PROPIONATE 50 MCG/ACT NA SUSP: 1.0000 | Freq: Every day | NASAL | 2 refills | Status: DC

## 2023-05-03 MED ORDER — MOUNJARO 5 MG/0.5ML ~~LOC~~ SOAJ: 5.0000 mg | SUBCUTANEOUS | 0 refills | Status: DC

## 2023-05-03 NOTE — Progress Notes (Signed)
 Established Patient Office Visit  Subjective:  Patient ID: Russell Ray, male    DOB: 04-Jul-1970  Age: 53 y.o. MRN: [id number]  No chief complaint on file.   C/o cough, sore throat, denies nasal congestion. Also here for lab review and medication refills. Labs reviewed and notable for well controlled diabetes, A1c at target, lipids at target with unremarkable cmp. Denies any hypoglycemic episodes and home bg readings have been at target.     No other concerns at this time.   Past Medical History:  Diagnosis Date   Anxiety    Aortic valve stenosis    Atrial fibrillation (HCC)    CHF (congestive heart failure) (HCC)    Depression    Diabetes mellitus without complication (HCC)    Hypertension     Past Surgical History:  Procedure Laterality Date   APPLICATION OF WOUND VAC     CARDIAC CATHETERIZATION Right 04/22/2015   Procedure: Right/Left Heart Cath and Coronary Angiography;  Surgeon: Laurier Nancy, MD;  Location: [facility]IVE [facility];  Service: Cardiovascular;  Laterality: Right;   CARDIAC VALVE REPLACEMENT     CARDIOVERSION N/A 05/14/2017   Procedure: CARDIOVERSION;  Surgeon: Laurier Nancy, MD;  Location: [facility] ORS;  Service: Cardiovascular;  Laterality: N/A;   COLONOSCOPY N/A 10/21/2020   Procedure: COLONOSCOPY;  Surgeon: Toledo, Boykin Nearing, MD;  Location: [facility] ENDOSCOPY;  Service: Gastroenterology;  Laterality: N/A;   COLONOSCOPY WITH PROPOFOL N/A 10/05/2016   Procedure: COLONOSCOPY WITH PROPOFOL;  Surgeon: Scot Jun, MD;  Location: [facility] ENDOSCOPY;  Service: Endoscopy;  Laterality: N/A;   debridement sternum     ELECTROPHYSIOLOGIC STUDY N/A 05/05/2015   Procedure: Cardioversion;  Surgeon: Laurier Nancy, MD;  Location: [facility] ORS;  Service: Cardiovascular;  Laterality: N/A;   ELECTROPHYSIOLOGIC STUDY N/A 05/17/2015   Procedure: CARDIOVERSION;  Surgeon: Laurier Nancy, MD;  Location: [facility] ORS;  Service: Cardiovascular;  Laterality: N/A;   ESOPHAGOGASTRODUODENOSCOPY (EGD)  WITH PROPOFOL N/A 10/05/2016   Procedure: ESOPHAGOGASTRODUODENOSCOPY (EGD) WITH PROPOFOL;  Surgeon: Scot Jun, MD;  Location: [facility] ENDOSCOPY;  Service: Endoscopy;  Laterality: N/A;   GREATER OMENTAL FLAP CLOSURE     HERNIA REPAIR     X3   VALVE REPLACEMENT     VENTRAL HERNIA REPAIR N/A 12/01/2016   Procedure: HERNIA REPAIR VENTRAL ADULT with mesh;  Surgeon: Leafy Ro, MD;  Location: [facility] ORS;  Service: General;  Laterality: N/A;    Social History   Socioeconomic History   Marital status: Single    Spouse name: Not on file   Number of children: Not on file   Years of education: Not on file   Highest education level: Not on file  Occupational History   Occupation: Transit Driver  Tobacco Use   Smoking status: Former    Current packs/day: 2.00    Average packs/day: 2.0 packs/day for 28.0 years (56.0 ttl pk-yrs)    Types: Cigarettes   Smokeless tobacco: Never  Vaping Use   Vaping status: Never Used  Substance and Sexual Activity   Alcohol use: No   Drug use: No   Sexual activity: Yes  Other Topics Concern   Not on file  Social History Narrative   Not on file   Social Drivers of Health   Financial Resource Strain: Not on file  Food Insecurity: Not on file  Transportation Needs: No Transportation Needs (02/10/2022)   Received from [facility], [facility]   [facility] - Transportation  In the past 12 months, has lack of transportation kept you from medical appointments or from getting medications?: No    Lack of Transportation (Non-Medical): No  Physical Activity: Not on file  Stress: Not on file  Social Connections: Unknown (07/11/2021)   Received from [facility], [facility]   Social Network    Social Network: Not on file  Intimate Partner Violence: Unknown (06/01/2021)   Received from [facility], [facility]   HITS    Physically Hurt: Not on file    Insult or Talk Down To: Not on file    Threaten Physical Harm:  Not on file    Scream or Curse: Not on file    Family History  Problem Relation Age of Onset   Hypertension Mother    Hypertension Maternal Grandmother    Diabetes Maternal Grandmother     Allergies  Allergen Reactions   Lovenox [Enoxaparin Sodium] Itching    Outpatient Medications Prior to Visit  Medication Sig   acetaminophen (TYLENOL) 325 MG tablet Take 650 mg by mouth every 4 (four) hours as needed for moderate pain.   amiodarone (PACERONE) 200 MG tablet TAKE 1 TABLET BY MOUTH TWICE A DAY   budesonide-formoterol (SYMBICORT) 80-4.5 MCG/ACT inhaler Inhale 2 puffs into the lungs 2 (two) times daily.   celecoxib (CELEBREX) 400 MG capsule Take 1 capsule (400 mg total) by mouth daily.   cetirizine (ZYRTEC ALLERGY) 10 MG tablet Take 1 tablet (10 mg total) by mouth daily.   cyanocobalamin 1000 MCG tablet Take 1,000 mcg by mouth daily. (Patient not taking: Reported on 05/04/2022)   fluticasone (FLONASE) 50 MCG/ACT nasal spray Place 1 spray into both nostrils daily.   ibandronate (BONIVA) 150 MG tablet Take 1 tablet (150 mg total) by mouth every 30 (thirty) days. Take in the morning with a full glass of water, on an empty stomach, and do not take anything else by mouth or lie down for the next 30 min.   Iron, Ferrous Sulfate, 142 (45 Fe) MG TBCR Take 1 tablet by mouth daily. (Patient not taking: Reported on 05/04/2022)   lisinopril (ZESTRIL) 20 MG tablet TAKE 1 TABLET BY MOUTH EVERY DAY   LORazepam (ATIVAN) 1 MG tablet Take 1 tablet (1 mg total) by mouth 2 (two) times daily.   magnesium oxide (MAG-OX) 400 MG tablet TAKE 2 TABLETS BY MOUTH TWICE A DAY   Magnesium Oxide 400 (240 Mg) MG TABS Take 1 tablet (400 mg total) by mouth every morning.   metoprolol succinate (TOPROL-XL) 50 MG 24 hr tablet TAKE 1 TABLET BY MOUTH EVERY DAY   ondansetron (ZOFRAN-ODT) 4 MG disintegrating tablet Take 1 tablet (4 mg total) by mouth every 8 (eight) hours as needed.   pioglitazone (ACTOS) 45 MG tablet Take 45 mg  by mouth daily. (Patient not taking: Reported on 05/04/2022)   rosuvastatin (CRESTOR) 20 MG tablet TAKE 1 TABLET BY MOUTH EVERY DAY IN THE EVENING   warfarin (COUMADIN) 1 MG tablet Take 1 tablet daily or as directed   warfarin (COUMADIN) 2 MG tablet TAKE 1 TABLET BY MOUTH DAILY OR AS DIRECTED   warfarin (COUMADIN) 3 MG tablet Take 3 mg by mouth as directed. Takes 3 mg all days except Mondays and Thursdays (Patient not taking: Reported on 07/27/2022)   No facility-administered medications prior to visit.    Review of Systems  Constitutional: Negative.   HENT: Negative.    Eyes: Negative.   Respiratory: Negative.    Cardiovascular: Negative.  Gastrointestinal: Negative.   Genitourinary: Negative.   Skin: Negative.   Neurological: Negative.   Endo/Heme/Allergies: Negative.        Objective:   BP 132/83   Pulse 62   Ht 5\' 11"  (1.803 m)   Wt (!) 411 lb (186.4 kg)   SpO2 98%   BMI 57.32 kg/m   Vitals:   05/03/23 0936  BP: 132/83  Pulse: 62  Height: 5\' 11"  (1.803 m)  Weight: (!) 411 lb (186.4 kg)  SpO2: 98%  BMI (Calculated): 57.35    Physical Exam Vitals reviewed.  Constitutional:      Appearance: Normal appearance.  HENT:     Head: Normocephalic.     Left Ear: There is no impacted cerumen.     Nose: Nose normal.     Mouth/Throat:     Mouth: Mucous membranes are moist.     Pharynx: No posterior oropharyngeal erythema.  Eyes:     Extraocular Movements: Extraocular movements intact.     Pupils: Pupils are equal, round, and reactive to light.  Cardiovascular:     Rate and Rhythm: Regular rhythm.     Chest Wall: PMI is not displaced.     Pulses: Normal pulses.     Heart sounds: Normal heart sounds. No murmur heard. Pulmonary:     Effort: Pulmonary effort is normal.     Breath sounds: Normal air entry. No rhonchi or rales.  Abdominal:     General: Abdomen is flat. Bowel sounds are normal. There is no distension.     Palpations: Abdomen is soft. There is no  hepatomegaly, splenomegaly or mass.     Tenderness: There is no abdominal tenderness.     Comments: Large pannus  Musculoskeletal:        General: Normal range of motion.     Cervical back: Normal range of motion and neck supple.     Right lower leg: No edema.     Left lower leg: No edema.  Skin:    General: Skin is warm and dry.  Neurological:     General: No focal deficit present.     Mental Status: He is alert and oriented to person, place, and time.     Cranial Nerves: No cranial nerve deficit.     Motor: No weakness.  Psychiatric:        Mood and Affect: Mood normal.        Behavior: Behavior normal.      Results for orders placed or performed in visit on 05/03/23  POCT CBG (Fasting - Glucose)  Result Value Ref Range   Glucose Fasting, POC 99 70 - 99 mg/dL    Recent Results (from the past 2160 hours)  Comprehensive metabolic panel     Status: None   Collection Time: 04/11/23  8:15 AM  Result Value Ref Range   Glucose 92 70 - 99 mg/dL   BUN 18 6 - 24 mg/dL   Creatinine, Ser 2.72 0.76 - 1.27 mg/dL   eGFR 90 >53 GU/YQI/3.47   BUN/Creatinine Ratio 18 9 - 20   Sodium 139 134 - 144 mmol/L   Potassium 4.5 3.5 - 5.2 mmol/L   Chloride 101 96 - 106 mmol/L   CO2 20 20 - 29 mmol/L   Calcium 10.2 8.7 - 10.2 mg/dL   Total Protein 6.5 6.0 - 8.5 g/dL   Albumin 4.2 3.8 - 4.9 g/dL   Globulin, Total 2.3 1.5 - 4.5 g/dL   Bilirubin Total 0.8 0.0 -  1.2 mg/dL   Alkaline Phosphatase 69 44 - 121 IU/L   AST 17 0 - 40 IU/L   ALT 12 0 - 44 IU/L  Lipid panel     Status: None   Collection Time: 04/11/23  8:15 AM  Result Value Ref Range   Cholesterol, Total 144 100 - 199 mg/dL   Triglycerides 93 0 - 149 mg/dL   HDL 60 >09 mg/dL   VLDL Cholesterol Cal 17 5 - 40 mg/dL   LDL Chol Calc (NIH) 67 0 - 99 mg/dL   Chol/HDL Ratio 2.4 0.0 - 5.0 ratio    Comment:                                   T. Chol/HDL Ratio                                             Men  Women                                1/2 Avg.Risk  3.4    3.3                                   Avg.Risk  5.0    4.4                                2X Avg.Risk  9.6    7.1                                3X Avg.Risk 23.4   11.0   Hemoglobin A1c     Status: Abnormal   Collection Time: 04/11/23  8:15 AM  Result Value Ref Range   Hgb A1c MFr Bld 6.3 (H) 4.8 - 5.6 %    Comment:          Prediabetes: 5.7 - 6.4          Diabetes: >6.4          Glycemic control for adults with diabetes: <7.0    Est. average glucose Bld gHb Est-mCnc 134 mg/dL  Protime-INR     Status: None   Collection Time: 04/11/23 12:04 PM  Result Value Ref Range   INR 1.0 0.9 - 1.2    Comment: Reference interval is for non-anticoagulated patients. Suggested INR therapeutic range for Vitamin K antagonist therapy:    Standard Dose (moderate intensity                   therapeutic range):       2.0 - 3.0    Higher intensity therapeutic range       2.5 - 3.5    Prothrombin Time 11.4 9.1 - 12.0 sec  POCT CBG (Fasting - Glucose)     Status: None   Collection Time: 05/03/23  9:39 AM  Result Value Ref Range   Glucose Fasting, POC 99 70 - 99 mg/dL      Assessment & Plan:  As per problem list  Problem List Items Addressed This Visit  Cardiovascular and Mediastinum   Essential hypertension     Endocrine   Type 2 diabetes mellitus without complication (HCC) - Primary   Relevant Orders   POCT CBG (Fasting - Glucose) (Completed)   Other Visit Diagnoses       Seasonal allergic rhinitis due to pollen         Acute bronchitis due to other specified organisms           Return in about 3 months (around 08/03/2023).   Total time spent: 20 minutes  Luna Fuse, MD  05/03/2023   This document may have been prepared by [facility] Voice Recognition software and as such may include unintentional dictation errors.

## 2023-05-04 LAB — PROTIME-INR
INR: 3.4 — ABNORMAL HIGH (ref 0.9–1.2)
Prothrombin Time: 34.5 s — ABNORMAL HIGH (ref 9.1–12.0)

## 2023-05-17 ENCOUNTER — Other Ambulatory Visit: Payer: Self-pay | Admitting: Internal Medicine

## 2023-05-17 DIAGNOSIS — H6120 Impacted cerumen, unspecified ear: Secondary | ICD-10-CM

## 2023-05-24 ENCOUNTER — Other Ambulatory Visit: Payer: Self-pay | Admitting: Internal Medicine

## 2023-05-24 DIAGNOSIS — E119 Type 2 diabetes mellitus without complications: Secondary | ICD-10-CM

## 2023-05-26 ENCOUNTER — Other Ambulatory Visit: Payer: Self-pay | Admitting: Internal Medicine

## 2023-05-27 ENCOUNTER — Other Ambulatory Visit: Payer: Self-pay | Admitting: Cardiovascular Disease

## 2023-06-06 DIAGNOSIS — H903 Sensorineural hearing loss, bilateral: Secondary | ICD-10-CM | POA: Diagnosis not present

## 2023-06-24 ENCOUNTER — Other Ambulatory Visit: Payer: Self-pay | Admitting: Cardiovascular Disease

## 2023-06-24 DIAGNOSIS — I4891 Unspecified atrial fibrillation: Secondary | ICD-10-CM

## 2023-06-24 DIAGNOSIS — E785 Hyperlipidemia, unspecified: Secondary | ICD-10-CM

## 2023-07-11 ENCOUNTER — Other Ambulatory Visit: Payer: Self-pay | Admitting: Internal Medicine

## 2023-07-11 DIAGNOSIS — M25569 Pain in unspecified knee: Secondary | ICD-10-CM

## 2023-07-16 ENCOUNTER — Other Ambulatory Visit: Payer: Self-pay

## 2023-07-16 DIAGNOSIS — L03311 Cellulitis of abdominal wall: Secondary | ICD-10-CM | POA: Insufficient documentation

## 2023-07-16 DIAGNOSIS — I110 Hypertensive heart disease with heart failure: Secondary | ICD-10-CM | POA: Insufficient documentation

## 2023-07-16 DIAGNOSIS — I5032 Chronic diastolic (congestive) heart failure: Secondary | ICD-10-CM | POA: Insufficient documentation

## 2023-07-16 DIAGNOSIS — Z955 Presence of coronary angioplasty implant and graft: Secondary | ICD-10-CM | POA: Diagnosis not present

## 2023-07-16 DIAGNOSIS — Z7901 Long term (current) use of anticoagulants: Secondary | ICD-10-CM | POA: Insufficient documentation

## 2023-07-16 DIAGNOSIS — R935 Abnormal findings on diagnostic imaging of other abdominal regions, including retroperitoneum: Secondary | ICD-10-CM | POA: Diagnosis not present

## 2023-07-16 DIAGNOSIS — Z794 Long term (current) use of insulin: Secondary | ICD-10-CM | POA: Insufficient documentation

## 2023-07-16 DIAGNOSIS — E119 Type 2 diabetes mellitus without complications: Secondary | ICD-10-CM | POA: Insufficient documentation

## 2023-07-16 DIAGNOSIS — Z79899 Other long term (current) drug therapy: Secondary | ICD-10-CM | POA: Diagnosis not present

## 2023-07-16 DIAGNOSIS — Z7984 Long term (current) use of oral hypoglycemic drugs: Secondary | ICD-10-CM | POA: Insufficient documentation

## 2023-07-16 DIAGNOSIS — K439 Ventral hernia without obstruction or gangrene: Secondary | ICD-10-CM | POA: Insufficient documentation

## 2023-07-16 DIAGNOSIS — F17200 Nicotine dependence, unspecified, uncomplicated: Secondary | ICD-10-CM | POA: Insufficient documentation

## 2023-07-16 DIAGNOSIS — K429 Umbilical hernia without obstruction or gangrene: Secondary | ICD-10-CM | POA: Diagnosis not present

## 2023-07-16 NOTE — ED Triage Notes (Signed)
 Pt to ED via POV c/o hernia. Pt reports having a hernia to RLQ that has been getting more painful and bigger since Saturday. Tender to touch. Pt has hx of multiple hernia repairs

## 2023-07-17 ENCOUNTER — Emergency Department

## 2023-07-17 ENCOUNTER — Emergency Department
Admission: EM | Admit: 2023-07-17 | Discharge: 2023-07-17 | Disposition: A | Discharge status: Home / Self Care | Attending: Emergency Medicine | Admitting: Emergency Medicine

## 2023-07-17 DIAGNOSIS — K429 Umbilical hernia without obstruction or gangrene: Secondary | ICD-10-CM | POA: Diagnosis not present

## 2023-07-17 DIAGNOSIS — K439 Ventral hernia without obstruction or gangrene: Secondary | ICD-10-CM

## 2023-07-17 DIAGNOSIS — R935 Abnormal findings on diagnostic imaging of other abdominal regions, including retroperitoneum: Secondary | ICD-10-CM | POA: Diagnosis not present

## 2023-07-17 DIAGNOSIS — L03311 Cellulitis of abdominal wall: Secondary | ICD-10-CM

## 2023-07-17 LAB — URINALYSIS, ROUTINE W REFLEX MICROSCOPIC
Bilirubin Urine: NEGATIVE
Glucose, UA: NEGATIVE mg/dL
Hgb urine dipstick: NEGATIVE
Ketones, ur: NEGATIVE mg/dL
Leukocytes,Ua: NEGATIVE
Nitrite: NEGATIVE
Protein, ur: NEGATIVE mg/dL
Specific Gravity, Urine: 1.018 (ref 1.005–1.030)
pH: 5 (ref 5.0–8.0)

## 2023-07-17 LAB — BASIC METABOLIC PANEL WITH GFR
Anion gap: 12 (ref 5–15)
BUN: 22 mg/dL — ABNORMAL HIGH (ref 6–20)
CO2: 20 mmol/L — ABNORMAL LOW (ref 22–32)
Calcium: 9.8 mg/dL (ref 8.9–10.3)
Chloride: 99 mmol/L (ref 98–111)
Creatinine, Ser: 1.15 mg/dL (ref 0.61–1.24)
GFR, Estimated: >60 mL/min (ref >60)
Glucose, Bld: 122 mg/dL — ABNORMAL HIGH (ref 70–99)
Potassium: 4.1 mmol/L (ref 3.5–5.1)
Sodium: 131 mmol/L — ABNORMAL LOW (ref 135–145)

## 2023-07-17 LAB — PROTIME-INR
INR: 2.6 — ABNORMAL HIGH (ref 0.8–1.2)
Prothrombin Time: 28.2 s — ABNORMAL HIGH (ref 11.4–15.2)

## 2023-07-17 LAB — CBC
HCT: 42.1 % (ref 39.0–52.0)
Hemoglobin: 13.3 g/dL (ref 13.0–17.0)
MCH: 28.6 pg (ref 26.0–34.0)
MCHC: 31.6 g/dL (ref 30.0–36.0)
MCV: 90.5 fL (ref 80.0–100.0)
Platelets: 172 10*3/uL (ref 150–400)
RBC: 4.65 MIL/uL (ref 4.22–5.81)
RDW: 14.6 % (ref 11.5–15.5)
WBC: 7.9 10*3/uL (ref 4.0–10.5)
nRBC: 0 % (ref 0.0–0.2)

## 2023-07-17 LAB — LACTIC ACID, PLASMA: Lactic Acid, Venous: 1 mmol/L (ref 0.5–1.9)

## 2023-07-17 MED ORDER — OXYCODONE HCL 5 MG PO TABS: 5.0000 mg | ORAL_TABLET | ORAL | 0 refills | Status: DC | PRN

## 2023-07-17 MED ORDER — SODIUM CHLORIDE 0.9 % IV BOLUS
500.0000 mL | Freq: Once | INTRAVENOUS | Status: AC
  Administered 2023-07-17: 500 mL via INTRAVENOUS

## 2023-07-17 MED ORDER — IOHEXOL 350 MG/ML SOLN
125.0000 mL | Freq: Once | INTRAVENOUS | Status: AC | PRN
  Administered 2023-07-17: 125 mL via INTRAVENOUS

## 2023-07-17 MED ORDER — OXYCODONE HCL 5 MG PO TABS
5.0000 mg | ORAL_TABLET | Freq: Once | ORAL | Status: AC
  Administered 2023-07-17: 5 mg via ORAL
  Filled 2023-07-17: qty 1

## 2023-07-17 MED ORDER — ONDANSETRON HCL 4 MG/2ML IJ SOLN
4.0000 mg | Freq: Once | INTRAMUSCULAR | Status: AC
  Administered 2023-07-17: 4 mg via INTRAVENOUS
  Filled 2023-07-17: qty 2

## 2023-07-17 MED ORDER — CEPHALEXIN 500 MG PO CAPS: 500.0000 mg | ORAL_CAPSULE | Freq: Three times a day (TID) | ORAL | 0 refills | Status: DC

## 2023-07-17 MED ORDER — PIPERACILLIN-TAZOBACTAM 3.375 G IVPB 30 MIN
3.3750 g | Freq: Once | INTRAVENOUS | Status: AC
  Administered 2023-07-17: 3.375 g via INTRAVENOUS
  Filled 2023-07-17 (×2): qty 50

## 2023-07-17 MED ORDER — MORPHINE SULFATE (PF) 4 MG/ML IV SOLN
4.0000 mg | Freq: Once | INTRAVENOUS | Status: AC
  Administered 2023-07-17: 4 mg via INTRAVENOUS
  Filled 2023-07-17: qty 1

## 2023-07-17 NOTE — ED Provider Notes (Signed)
 [facility] Provider Note    Event Date/Time   First MD Initiated Contact with Patient 07/17/23 [phone]     (approximate)   History   Hernia   HPI  Russell Ray is a 53 y.o. male who presents to the ED from home with a chief complaint of painful hernia.  Patient with a history of bariatric surgery, hernia repair x 3, known ventral hernia, on warfarin for aortic valve replacement with known ventral hernia for over 6 months.  Felt constipated over the weekend so drink a milkshake which emptied his bowels sufficiently.  Since then, patient has been having more pain to the ventral hernia site.  Denies associated fever/chills, chest pain, shortness of breath, nausea, vomiting or diarrhea.     Past Medical History   Past Medical History:  Diagnosis Date   Anxiety    Aortic valve stenosis    Atrial fibrillation (HCC)    CHF (congestive heart failure) (HCC)    Depression    Diabetes mellitus without complication (HCC)    Hypertension      Active Problem List   Patient Active Problem List   Diagnosis Date Noted   Hyperparathyroidism (HCC) 02/01/2023   Pannus, abdominal 02/01/2023   GI bleeding 07/03/2021   Paroxysmal atrial fibrillation (HCC) 07/03/2021   Essential hypertension 07/03/2021   Hypotension 07/03/2021   Dyslipidemia 07/03/2021   Status post aortic valve replacement 07/03/2021   Lower GI bleeding 10/20/2020   Anemia due to blood loss 10/20/2020   Ventral hernia with bowel obstruction 12/01/2016   Atrial fibrillation (HCC) 05/17/2015   Elevated troponin 05/05/2015   Obesity 05/05/2015   Obstructive sleep apnea 05/05/2015   Hyponatremia 05/05/2015   Leukocytosis 05/05/2015   Iron deficiency anemia 05/05/2015   Anticoagulated on Coumadin  05/05/2015   H/O aortic valve replacement 05/05/2015   A-fib (HCC) 05/03/2015   Atrial fibrillation with RVR (HCC) 05/03/2015   Anxiety 04/23/2015   Chronic diastolic CHF (congestive heart failure) (HCC)  04/23/2015   Essential (primary) hypertension 04/23/2015   Type 2 diabetes mellitus without complication (HCC) 04/23/2015   Chest pain, central 04/22/2015   Morbid obesity with BMI of 50.0-59.9, adult (HCC) 04/22/2015   Morbid obesity (HCC) 03/19/2015   Leg swelling 03/19/2015   Orthopnea 03/19/2015   Tobacco abuse 03/19/2015   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     Past Surgical History   Past Surgical History:  Procedure Laterality Date   APPLICATION OF WOUND VAC     CARDIAC CATHETERIZATION Right 04/22/2015   Procedure: Right/Left Heart Cath and Coronary Angiography;  Surgeon: Cherrie Cornwall, MD;  Location: [facility] INVASIVE CV LAB;  Service: Cardiovascular;  Laterality: Right;   CARDIAC VALVE REPLACEMENT     CARDIOVERSION N/A 05/14/2017   Procedure: CARDIOVERSION;  Surgeon: Cherrie Cornwall, MD;  Location: [facility] ORS;  Service: Cardiovascular;  Laterality: N/A;   COLONOSCOPY N/A 10/21/2020   Procedure: COLONOSCOPY;  Surgeon: Toledo, Alphonsus Jeans, MD;  Location: [facility] ENDOSCOPY;  Service: Gastroenterology;  Laterality: N/A;   COLONOSCOPY WITH PROPOFOL  N/A 10/05/2016   Procedure: COLONOSCOPY WITH PROPOFOL ;  Surgeon: Cassie Click, MD;  Location: [facility] ENDOSCOPY;  Service: Endoscopy;  Laterality: N/A;   debridement sternum     ELECTROPHYSIOLOGIC STUDY N/A 05/05/2015   Procedure: Cardioversion;  Surgeon: Cherrie Cornwall, MD;  Location: [facility] ORS;  Service: Cardiovascular;  Laterality: N/A;   ELECTROPHYSIOLOGIC STUDY N/A 05/17/2015   Procedure: CARDIOVERSION;  Surgeon: Cherrie Cornwall, MD;  Location: [facility]  ORS;  Service: Cardiovascular;  Laterality: N/A;   ESOPHAGOGASTRODUODENOSCOPY (EGD) WITH PROPOFOL  N/A 10/05/2016   Procedure: ESOPHAGOGASTRODUODENOSCOPY (EGD) WITH PROPOFOL ;  Surgeon: Cassie Click, MD;  Location: [facility] ENDOSCOPY;  Service: Endoscopy;  Laterality: N/A;   GREATER OMENTAL FLAP CLOSURE     HERNIA REPAIR     X3   VALVE REPLACEMENT     VENTRAL HERNIA REPAIR  N/A 12/01/2016   Procedure: HERNIA REPAIR VENTRAL ADULT with mesh;  Surgeon: Alben Alma, MD;  Location: [facility] ORS;  Service: General;  Laterality: N/A;     Home Medications   Prior to Admission medications   Medication Sig Start Date End Date Taking? Authorizing Provider  cephALEXin (KEFLEX) 500 MG capsule Take 1 capsule (500 mg total) by mouth 3 (three) times daily. 07/17/23  Yes Norlene Beavers, MD  oxyCODONE  (ROXICODONE ) 5 MG immediate release tablet Take 1 tablet (5 mg total) by mouth every 4 (four) hours as needed for severe pain (pain score 7-10). 07/17/23  Yes Norlene Beavers, MD  acetaminophen  (TYLENOL ) 325 MG tablet Take 650 mg by mouth every 4 (four) hours as needed for moderate pain.    [provider]  amiodarone  (PACERONE ) 200 MG tablet TAKE 1 TABLET BY MOUTH TWICE A DAY 05/27/23   Cherrie Cornwall, MD  budesonide -formoterol  (SYMBICORT ) 80-4.5 MCG/ACT inhaler Inhale 2 puffs into the lungs 2 (two) times daily. 02/01/23 05/16/23  Shari Daughters, MD  celecoxib  (CELEBREX ) 400 MG capsule TAKE 1 CAPSULE BY MOUTH DAILY. 07/12/23   Shari Daughters, MD  cetirizine  (ZYRTEC  ALLERGY) 10 MG tablet Take 1 tablet (10 mg total) by mouth daily. 05/03/23 08/01/23  Tejan-Sie, S Ahmed, MD  cyanocobalamin  1000 MCG tablet Take 1,000 mcg by mouth daily. Patient not taking: Reported on 05/04/2022    [provider]  fluticasone  (FLONASE ) 50 MCG/ACT nasal spray Place 1 spray into both nostrils daily. 05/03/23 08/01/23  Shari Daughters, MD  ibandronate  (BONIVA ) 150 MG tablet Take 1 tablet (150 mg total) by mouth every 30 (thirty) days. Take in the morning with a full glass of water, on an empty stomach, and do not take anything else by mouth or lie down for the next 30 min. 02/13/23 02/13/24  Tejan-Sie, S Ahmed, MD  Iron, Ferrous Sulfate , 142 (45 Fe) MG TBCR Take 1 tablet by mouth daily. Patient not taking: Reported on 05/04/2022    [provider]  lisinopril  (ZESTRIL ) 20 MG tablet Take 1  tablet (20 mg total) by mouth daily. 05/03/23   Shari Daughters, MD  LORazepam  (ATIVAN ) 1 MG tablet Take 1 tablet (1 mg total) by mouth 2 (two) times daily. 04/26/23   Shari Daughters, MD  magnesium  oxide (MAG-OX) 400 MG tablet TAKE 2 TABLETS BY MOUTH TWICE A DAY 05/27/23   Shari Daughters, MD  Magnesium  Oxide 400 (240 Mg) MG TABS Take 1 tablet (400 mg total) by mouth every morning. 05/16/17   Verla Glaze, MD  metoprolol  succinate (TOPROL -XL) 50 MG 24 hr tablet TAKE 1 TABLET BY MOUTH EVERY DAY 06/24/23   Cherrie Cornwall, MD  ondansetron  (ZOFRAN -ODT) 4 MG disintegrating tablet Take 1 tablet (4 mg total) by mouth every 8 (eight) hours as needed. 04/17/22   Tejan-Sie, S Ahmed, MD  pioglitazone  (ACTOS ) 45 MG tablet Take 45 mg by mouth daily. Patient not taking: Reported on 05/04/2022    [provider]  rosuvastatin (CRESTOR) 20 MG tablet TAKE 1 TABLET BY MOUTH EVERY DAY IN THE EVENING  06/24/23   Cherrie Cornwall, MD  tirzepatide [facility]) 2.5 MG/0.5ML Pen INJECT 2.5 MG SUBCUTANEOUSLY WEEKLY 05/24/23   Tejan-Sie, S Ahmed, MD  tirzepatide [facility]) 5 MG/0.5ML Pen Inject 5 mg into the skin once a week. 05/03/23 07/26/23  Shari Daughters, MD  warfarin (COUMADIN ) 1 MG tablet Take 1 tablet daily or as directed 08/06/22   Cherrie Cornwall, MD  warfarin (COUMADIN ) 2 MG tablet TAKE 1 TABLET BY MOUTH DAILY OR AS DIRECTED 04/08/23   Debborah Fairly A, MD  warfarin (COUMADIN ) 3 MG tablet Take 3 mg by mouth as directed. Takes 3 mg all days except Mondays and Thursdays Patient not taking: Reported on 07/27/2022    [provider]     Allergies  Lovenox  [enoxaparin  sodium]   Family History   Family History  Problem Relation Age of Onset   Hypertension Mother    Hypertension Maternal Grandmother    Diabetes Maternal Grandmother      Physical Exam  Triage Vital Signs: ED Triage Vitals  Encounter Vitals Group     BP 07/16/23 2351 113/72     Systolic BP Percentile --      Diastolic BP  Percentile --      Pulse Rate 07/16/23 2351 73     Resp 07/16/23 2351 18     Temp 07/16/23 2351 98.3 F (36.8 C)     Temp Source 07/16/23 2351 Oral     SpO2 07/16/23 2351 100 %     Weight 07/16/23 2349 (!) 402 lb (182.3 kg)     Height 07/16/23 2349 5\' 9"  (1.753 m)     Head Circumference --      Peak Flow --      Pain Score 07/16/23 2349 8     Pain Loc --      Pain Education --      Exclude from Growth Chart --     Updated Vital Signs: BP 113/72   Pulse 73   Temp 98.3 F (36.8 C) (Oral)   Resp 18   Ht 5\' 9"  (1.753 m)   Wt (!) 182.3 kg   SpO2 100%   BMI 59.37 kg/m    General: Awake, mild distress.  CV:  RRR.  Good peripheral perfusion.  Resp:  Normal effort.  CTAB. Abd:  Morbidly obese.  Multiple well-healed abdominal wall incisions.  Indurated lower abdomen which is tender to palpation.  No distention.  Other:  No truncal vesicles.   ED Results / Procedures / Treatments  Labs (all labs ordered are listed, but only abnormal results are displayed) Labs Reviewed  BASIC METABOLIC PANEL WITH GFR - Abnormal; Notable for the following components:      Result Value   Sodium 131 (*)    CO2 20 (*)    Glucose, Bld 122 (*)    BUN 22 (*)    All other components within normal limits  URINALYSIS, ROUTINE W REFLEX MICROSCOPIC - Abnormal; Notable for the following components:   Color, Urine YELLOW (*)    APPearance CLEAR (*)    All other components within normal limits  PROTIME-INR - Abnormal; Notable for the following components:   Prothrombin  Time 28.2 (*)    INR 2.6 (*)    All other components within normal limits  CBC  LACTIC ACID, PLASMA     EKG  None   RADIOLOGY I have independently visualized and interpreted patient's imaging study as well as noted the radiology interpretation:  CT abdomen/pelvis: Inflammatory process/cellulitis,  possible phlegmon without drainable fluid collection, ventral hernia  Official radiology report(s): CT ABDOMEN PELVIS W  CONTRAST Result Date: 07/17/2023 CLINICAL DATA:  Acute nonlocalized abdominal pain, abdominal wall hernia EXAM: CT ABDOMEN AND PELVIS WITH CONTRAST TECHNIQUE: Multidetector CT imaging of the abdomen and pelvis was performed using the standard protocol following bolus administration of intravenous contrast. RADIATION DOSE REDUCTION: This exam was performed according to the departmental dose-optimization program which includes automated exposure control, adjustment of the mA and/or kV according to patient size and/or use of iterative reconstruction technique. CONTRAST:  OMNIPAQUE  IOHEXOL  350 MG/ML SOLN COMPARISON:  12/01/2016 FINDINGS: Lower chest: No acute abnormality. Hepatobiliary: Heterogeneous attenuation of the hepatic parenchyma likely relates to geographic hepatic steatosis. No enhancing intrahepatic mass. No intra or extrahepatic biliary ductal dilation. Gallbladder. Pancreas: Unremarkable Spleen: Unremarkable Adrenals/Urinary Tract: Progressive enlargement of right adrenal nodule now measuring 3.0 cm, measuring 34 Hounsfield units in density. This is indeterminate though its slow interval growth favors a benign etiology such as an adrenal adenoma. Left adrenal gland is unremarkable. Kidneys are unremarkable. Bladder unremarkable. Stomach/Bowel: Epigastric ventral hernia is relatively broad-based contains and unremarkable loop mid transverse colon. Interval repair of the complex previously identified umbilical/ventral hernia utilizing mesh with small, recurrent hernias containing both mesenteric fat as well as a single knuckle of unremarkable mid small bowel. Gastric sleeve resection has been performed. Stomach, small bowel, and large bowel are otherwise unremarkable. Appendix normal. No free intraperitoneal gas or fluid. Vascular/Lymphatic: Aortic atherosclerosis. No enlarged abdominal or pelvic lymph nodes. Reproductive: Prostate is unremarkable. Other: There is extensive infiltrative change within  the subcutaneous fat of the anterior abdominal wall superficial to the site of hernia repair and are in keeping with a inflammatory process such as cellulitis or or local trauma. There is a poorly circumscribed phlegmonous region immediately superficial to the deep abdominal fascia measuring roughly 2.8 x 7.3 cm in size. No discrete drainable fluid collection is identified, however. Small bilateral fat containing inguinal hernias are present. Musculoskeletal: No acute bone abnormality. No lytic or blastic bone lesion. Osseous structures are age appropriate. IMPRESSION: 1. Extensive infiltrative change within the subcutaneous fat of the anterior abdominal wall superficial to the site of hernia repair and in keeping with a inflammatory process such as cellulitis or local trauma. There is a poorly circumscribed phlegmonous region immediately superficial to the deep abdominal fascia measuring roughly 2.8 x 7.3 cm in size. No discrete drainable fluid collection is identified. 2. Interval repair of the previously identified umbilical/ventral hernia utilizing mesh with small, recurrent hernias containing both mesenteric fat as well as a single knuckle of unremarkable mid small bowel. 3. Epigastric ventral hernia containing unremarkable loop of mid transverse colon. 4. Progressive enlargement of a 3.0 cm right adrenal nodule. This is indeterminate though its slow interval growth favors a benign etiology such as an adrenal adenoma. This could be confirmed with adrenal mass protocol CT or MRI examination. 5. Geographic hepatic steatosis. Aortic Atherosclerosis (ICD10-I70.0). Electronically Signed   By: Worthy Heads M.D.   On: 07/17/2023 05:34     PROCEDURES:  Critical Care performed: No  .1-3 Lead EKG Interpretation  Performed by: Norlene Beavers, MD Authorized by: Norlene Beavers, MD     Interpretation: normal     ECG rate:  75   ECG rate assessment: normal     Rhythm: sinus rhythm     Ectopy: none      Conduction: normal   Comments:     Patient placed on cardiac  monitor to evaluate for arrhythmias    MEDICATIONS ORDERED IN ED: Medications  piperacillin -tazobactam (ZOSYN ) IVPB 3.375 g (has no administration in time range)  sodium chloride  0.9 % bolus 500 mL (500 mLs Intravenous New Bag/Given 07/17/23 0438)  ondansetron  (ZOFRAN ) injection 4 mg (4 mg Intravenous Given 07/17/23 0426)  morphine  (PF) 4 MG/ML injection 4 mg (4 mg Intravenous Given 07/17/23 0426)  iohexol  (OMNIPAQUE ) 350 MG/ML injection 125 mL (125 mLs Intravenous Contrast Given 07/17/23 0445)     IMPRESSION / MDM / ASSESSMENT AND PLAN / ED COURSE  I reviewed the triage vital signs and the nursing notes.                             53 year old male presenting with abdominal pain, known ventral wall hernia. Differential diagnosis includes, but is not limited to, acute appendicitis, renal colic, testicular torsion, urinary tract infection/pyelonephritis, prostatitis,  epididymitis, diverticulitis, small bowel obstruction or ileus, colitis, abdominal aortic aneurysm, gastroenteritis, hernia, etc. I have personally reviewed patient's records and note a PCP office visit from 05/03/2023 for follow-up diabetes, hypertension, allergic rhinitis.  Patient's presentation is most consistent with acute complicated illness / injury requiring diagnostic workup.  The patient is on the cardiac monitor to evaluate for evidence of arrhythmia and/or significant heart rate changes.  Laboratory results unremarkable.  Will check lactic acid, INR, UA.  Administer IV Morphine  for pain.  Will obtain CT abdomen/pelvis to evaluate for incarcerated hernia.  Clinical Course as of 07/17/23 0549  Wed Jul 17, 2023  0548 CT demonstrates inflammatory process/cellulitis, possible developing phlegmon but no drainable abscess.  Lactic acid negative.  INR 2.6.  Administer IV Zosyn  in the ED, Keflex outpatient.  Limited prescription for oxycodone  provided.  Patient has  Zofran  already at home.  He will follow-up with general surgery.  Strict return precautions given.  Patient verbalizes understanding and agrees with plan of care. [JS]    Clinical Course User Index [JS] Norlene Beavers, MD     FINAL CLINICAL IMPRESSION(S) / ED DIAGNOSES   Final diagnoses:  Cellulitis of abdominal wall  Ventral hernia without obstruction or gangrene     Rx / DC Orders   ED Discharge Orders          Ordered    cephALEXin (KEFLEX) 500 MG capsule  3 times daily        07/17/23 0547    oxyCODONE  (ROXICODONE ) 5 MG immediate release tablet  Every 4 hours PRN        07/17/23 0547             Note:  This document was prepared using Dragon voice recognition software and may include unintentional dictation errors.   Sadrac Zeoli J, MD 07/17/23 [phone]

## 2023-07-17 NOTE — ED Notes (Signed)
 ED Provider at bedside.

## 2023-07-17 NOTE — Discharge Instructions (Addendum)
 Take and finish antibiotic as prescribed.  You may take pain and nausea medicines as needed.  Return to the ER for worsening symptoms, persistent vomiting, difficulty breathing or other concerns.

## 2023-07-25 DIAGNOSIS — Z952 Presence of prosthetic heart valve: Secondary | ICD-10-CM | POA: Diagnosis not present

## 2023-07-25 DIAGNOSIS — Z7985 Long-term (current) use of injectable non-insulin antidiabetic drugs: Secondary | ICD-10-CM | POA: Diagnosis not present

## 2023-07-25 DIAGNOSIS — Z79899 Other long term (current) drug therapy: Secondary | ICD-10-CM | POA: Diagnosis not present

## 2023-07-25 DIAGNOSIS — Z6841 Body Mass Index (BMI) 40.0 and over, adult: Secondary | ICD-10-CM | POA: Diagnosis not present

## 2023-07-25 DIAGNOSIS — Z9884 Bariatric surgery status: Secondary | ICD-10-CM | POA: Diagnosis not present

## 2023-07-25 DIAGNOSIS — K432 Incisional hernia without obstruction or gangrene: Secondary | ICD-10-CM | POA: Diagnosis not present

## 2023-07-25 DIAGNOSIS — K430 Incisional hernia with obstruction, without gangrene: Secondary | ICD-10-CM | POA: Diagnosis not present

## 2023-07-25 DIAGNOSIS — K30 Functional dyspepsia: Secondary | ICD-10-CM | POA: Diagnosis not present

## 2023-07-25 DIAGNOSIS — Z7901 Long term (current) use of anticoagulants: Secondary | ICD-10-CM | POA: Diagnosis not present

## 2023-07-25 DIAGNOSIS — Z860101 Personal history of adenomatous and serrated colon polyps: Secondary | ICD-10-CM | POA: Diagnosis not present

## 2023-07-25 DIAGNOSIS — Z87891 Personal history of nicotine dependence: Secondary | ICD-10-CM | POA: Diagnosis not present

## 2023-07-25 DIAGNOSIS — T8141XA Infection following a procedure, superficial incisional surgical site, initial encounter: Secondary | ICD-10-CM | POA: Diagnosis not present

## 2023-07-25 DIAGNOSIS — E739 Lactose intolerance, unspecified: Secondary | ICD-10-CM | POA: Diagnosis not present

## 2023-08-01 DIAGNOSIS — E119 Type 2 diabetes mellitus without complications: Secondary | ICD-10-CM | POA: Diagnosis not present

## 2023-08-02 LAB — HEMOGLOBIN A1C
Est. average glucose Bld gHb Est-mCnc: 134 mg/dL
Hgb A1c MFr Bld: 6.3 % — ABNORMAL HIGH (ref 4.8–5.6)

## 2023-08-05 LAB — LIPID PANEL
Chol/HDL Ratio: 2.5 ratio (ref 0.0–5.0)
Cholesterol, Total: 116 mg/dL (ref 100–199)
HDL: 47 mg/dL (ref >39)
LDL Chol Calc (NIH): 55 mg/dL (ref 0–99)
Triglycerides: 67 mg/dL (ref 0–149)
VLDL Cholesterol Cal: 14 mg/dL (ref 5–40)

## 2023-08-10 ENCOUNTER — Encounter: Payer: Self-pay | Admitting: Internal Medicine

## 2023-08-14 ENCOUNTER — Ambulatory Visit: Admitting: Internal Medicine

## 2023-08-14 ENCOUNTER — Other Ambulatory Visit: Payer: Self-pay | Admitting: Internal Medicine

## 2023-08-14 DIAGNOSIS — E119 Type 2 diabetes mellitus without complications: Secondary | ICD-10-CM

## 2023-08-27 ENCOUNTER — Ambulatory Visit: Payer: Self-pay | Admitting: Internal Medicine

## 2023-08-27 ENCOUNTER — Ambulatory Visit (INDEPENDENT_AMBULATORY_CARE_PROVIDER_SITE_OTHER): Admitting: Internal Medicine

## 2023-08-27 ENCOUNTER — Encounter: Payer: Self-pay | Admitting: Internal Medicine

## 2023-08-27 VITALS — BP 129/83 | HR 77 | Temp 98.7°F | Ht 71.0 in | Wt 388.0 lb

## 2023-08-27 DIAGNOSIS — Z0130 Encounter for examination of blood pressure without abnormal findings: Secondary | ICD-10-CM

## 2023-08-27 DIAGNOSIS — E119 Type 2 diabetes mellitus without complications: Secondary | ICD-10-CM

## 2023-08-27 DIAGNOSIS — F419 Anxiety disorder, unspecified: Secondary | ICD-10-CM | POA: Diagnosis not present

## 2023-08-27 DIAGNOSIS — M25569 Pain in unspecified knee: Secondary | ICD-10-CM | POA: Diagnosis not present

## 2023-08-27 DIAGNOSIS — J301 Allergic rhinitis due to pollen: Secondary | ICD-10-CM | POA: Diagnosis not present

## 2023-08-27 DIAGNOSIS — J208 Acute bronchitis due to other specified organisms: Secondary | ICD-10-CM | POA: Diagnosis not present

## 2023-08-27 LAB — POCT CBG (FASTING - GLUCOSE)-MANUAL ENTRY: Glucose Fasting, POC: 90 mg/dL (ref 70–99)

## 2023-08-27 MED ORDER — FLUTICASONE PROPIONATE 50 MCG/ACT NA SUSP: 1.0000 | Freq: Every day | NASAL | 2 refills | Status: AC

## 2023-08-27 MED ORDER — DOXYCYCLINE HYCLATE 100 MG PO TABS
100.0000 mg | ORAL_TABLET | Freq: Two times a day (BID) | ORAL | 0 refills | Status: AC
Start: 2023-08-27 — End: 2023-09-01

## 2023-08-27 MED ORDER — CELECOXIB 400 MG PO CAPS: 400.0000 mg | ORAL_CAPSULE | Freq: Every day | ORAL | 2 refills | Status: DC

## 2023-08-27 MED ORDER — BUDESONIDE-FORMOTEROL FUMARATE 80-4.5 MCG/ACT IN AERO: 2.0000 | INHALATION_SPRAY | Freq: Two times a day (BID) | RESPIRATORY_TRACT | 2 refills | Status: AC

## 2023-08-27 MED ORDER — CETIRIZINE HCL 10 MG PO TABS: 10.0000 mg | ORAL_TABLET | Freq: Every day | ORAL | 2 refills | Status: AC

## 2023-08-27 MED ORDER — LORAZEPAM 1 MG PO TABS: 1.0000 mg | ORAL_TABLET | Freq: Two times a day (BID) | ORAL | 2 refills | Status: DC

## 2023-08-27 MED ORDER — MOUNJARO 5 MG/0.5ML ~~LOC~~ SOAJ: 5.0000 mg | SUBCUTANEOUS | 0 refills | Status: DC

## 2023-08-27 MED ORDER — LORAZEPAM 1 MG PO TABS: 1.0000 mg | ORAL_TABLET | Freq: Two times a day (BID) | ORAL | 2 refills | Status: AC

## 2023-08-27 NOTE — Progress Notes (Signed)
 Established Patient Office Visit  Subjective:  Patient ID: Russell Ray, male    DOB: December 14, 1970  Age: 53 y.o. MRN: [id number]  Chief Complaint  Patient presents with   Follow-up    3 month lab results    No new complaints, here for lab review and medication refills.     No other concerns at this time.   Past Medical History:  Diagnosis Date   Anxiety    Aortic valve stenosis    Atrial fibrillation (HCC)    CHF (congestive heart failure) (HCC)    Depression    Diabetes mellitus without complication (HCC)    Hypertension     Past Surgical History:  Procedure Laterality Date   APPLICATION OF WOUND VAC     CARDIAC CATHETERIZATION Right 04/22/2015   Procedure: Right/Left Heart Cath and Coronary Angiography;  Surgeon: Denyse DELENA Bathe, MD;  Location: [facility] INVASIVE CV LAB;  Service: Cardiovascular;  Laterality: Right;   CARDIAC VALVE REPLACEMENT     CARDIOVERSION N/A 05/14/2017   Procedure: CARDIOVERSION;  Surgeon: Bathe Denyse DELENA, MD;  Location: [facility] ORS;  Service: Cardiovascular;  Laterality: N/A;   COLONOSCOPY N/A 10/21/2020   Procedure: COLONOSCOPY;  Surgeon: Toledo, Ladell POUR, MD;  Location: [facility] ENDOSCOPY;  Service: Gastroenterology;  Laterality: N/A;   COLONOSCOPY WITH PROPOFOL  N/A 10/05/2016   Procedure: COLONOSCOPY WITH PROPOFOL ;  Surgeon: Viktoria Lamar DASEN, MD;  Location: [facility] ENDOSCOPY;  Service: Endoscopy;  Laterality: N/A;   debridement sternum     ELECTROPHYSIOLOGIC STUDY N/A 05/05/2015   Procedure: Cardioversion;  Surgeon: Denyse DELENA Bathe, MD;  Location: [facility] ORS;  Service: Cardiovascular;  Laterality: N/A;   ELECTROPHYSIOLOGIC STUDY N/A 05/17/2015   Procedure: CARDIOVERSION;  Surgeon: Denyse DELENA Bathe, MD;  Location: [facility] ORS;  Service: Cardiovascular;  Laterality: N/A;   ESOPHAGOGASTRODUODENOSCOPY (EGD) WITH PROPOFOL  N/A 10/05/2016   Procedure: ESOPHAGOGASTRODUODENOSCOPY (EGD) WITH PROPOFOL ;  Surgeon: Viktoria Lamar DASEN, MD;  Location: [facility] ENDOSCOPY;  Service: Endoscopy;   Laterality: N/A;   GREATER OMENTAL FLAP CLOSURE     HERNIA REPAIR     X3   VALVE REPLACEMENT     VENTRAL HERNIA REPAIR N/A 12/01/2016   Procedure: HERNIA REPAIR VENTRAL ADULT with mesh;  Surgeon: Jordis Laneta FALCON, MD;  Location: [facility] ORS;  Service: General;  Laterality: N/A;    Social History   Socioeconomic History   Marital status: Single    Spouse name: Not on file   Number of children: Not on file   Years of education: Not on file   Highest education level: Not on file  Occupational History   Occupation: Transit Driver  Tobacco Use   Smoking status: Former    Current packs/day: 2.00    Average packs/day: 2.0 packs/day for 28.0 years (56.0 ttl pk-yrs)    Types: Cigarettes   Smokeless tobacco: Never  Vaping Use   Vaping status: Never Used  Substance and Sexual Activity   Alcohol use: No   Drug use: No   Sexual activity: Yes  Other Topics Concern   Not on file  Social History Narrative   Not on file   Social Drivers of Health   Financial Resource Strain: Not on file  Food Insecurity: Not on file  Transportation Needs: No Transportation Needs (02/10/2022)   Received [facility]   PRAPARE - Transportation    In the past 12 months, has lack of transportation kept you from medical appointments or from getting medications?: No    Lack of Transportation (  Non-Medical): No  Physical Activity: Not on file  Stress: Not on file  Social Connections: Unknown (07/11/2021)   Received from [facility]   Social Network    Social Network: Not on file  Intimate Partner Violence: Unknown (06/01/2021)   Received from [facility]   HITS    Physically Hurt: Not on file    Insult or Talk Down To: Not on file    Threaten Physical Harm: Not on file    Scream or Curse: Not on file    Family History  Problem Relation Age of Onset   Hypertension Mother    Hypertension Maternal Grandmother    Diabetes Maternal Grandmother     Allergies  Allergen Reactions    Lovenox  [Enoxaparin  Sodium] Itching    Outpatient Medications Prior to Visit  Medication Sig   acetaminophen  (TYLENOL ) 325 MG tablet Take 650 mg by mouth every 4 (four) hours as needed for moderate pain.   amiodarone  (PACERONE ) 200 MG tablet TAKE 1 TABLET BY MOUTH TWICE A DAY   ibandronate  (BONIVA ) 150 MG tablet Take 1 tablet (150 mg total) by mouth every 30 (thirty) days. Take in the morning with a full glass of water, on an empty stomach, and do not take anything else by mouth or lie down for the next 30 min.   lisinopril  (ZESTRIL ) 20 MG tablet Take 1 tablet (20 mg total) by mouth daily.   magnesium  oxide (MAG-OX) 400 MG tablet TAKE 2 TABLETS BY MOUTH TWICE A DAY   Magnesium  Oxide 400 (240 Mg) MG TABS Take 1 tablet (400 mg total) by mouth every morning.   metoprolol  succinate (TOPROL -XL) 50 MG 24 hr tablet TAKE 1 TABLET BY MOUTH EVERY DAY   ondansetron  (ZOFRAN -ODT) 4 MG disintegrating tablet Take 1 tablet (4 mg total) by mouth every 8 (eight) hours as needed.   oxyCODONE  (ROXICODONE ) 5 MG immediate release tablet Take 1 tablet (5 mg total) by mouth every 4 (four) hours as needed for severe pain (pain score 7-10).   rosuvastatin (CRESTOR) 20 MG tablet TAKE 1 TABLET BY MOUTH EVERY DAY IN THE EVENING   warfarin (COUMADIN ) 2 MG tablet TAKE 1 TABLET BY MOUTH DAILY OR AS DIRECTED   [DISCONTINUED] budesonide -formoterol  (SYMBICORT ) 80-4.5 MCG/ACT inhaler Inhale 2 puffs into the lungs 2 (two) times daily.   [DISCONTINUED] celecoxib  (CELEBREX ) 400 MG capsule TAKE 1 CAPSULE BY MOUTH DAILY.   [DISCONTINUED] fluticasone  (FLONASE ) 50 MCG/ACT nasal spray Place 1 spray into both nostrils daily.   [DISCONTINUED] LORazepam  (ATIVAN ) 1 MG tablet Take 1 tablet (1 mg total) by mouth 2 (two) times daily.   [DISCONTINUED] tirzepatide  (MOUNJARO ) 5 MG/0.5ML Pen INJECT 5 MG SUBCUTANEOUSLY WEEKLY   cephALEXin  (KEFLEX ) 500 MG capsule Take 1 capsule (500 mg total) by mouth 3 (three) times daily. (Patient not taking:  Reported on 08/27/2023)   cyanocobalamin  1000 MCG tablet Take 1,000 mcg by mouth daily. (Patient not taking: Reported on 08/27/2023)   Iron, Ferrous Sulfate , 142 (45 Fe) MG TBCR Take 1 tablet by mouth daily. (Patient not taking: Reported on 08/27/2023)   pioglitazone  (ACTOS ) 45 MG tablet Take 45 mg by mouth daily. (Patient not taking: Reported on 08/27/2023)   tirzepatide  (MOUNJARO ) 2.5 MG/0.5ML Pen INJECT 2.5 MG SUBCUTANEOUSLY WEEKLY (Patient not taking: Reported on 08/27/2023)   warfarin (COUMADIN ) 1 MG tablet Take 1 tablet daily or as directed (Patient not taking: Reported on 08/27/2023)   warfarin (COUMADIN ) 3 MG tablet Take 3 mg by mouth as directed. Takes 3 mg all  days except Mondays and Thursdays (Patient not taking: Reported on 08/27/2023)   [DISCONTINUED] cetirizine  (ZYRTEC  ALLERGY) 10 MG tablet Take 1 tablet (10 mg total) by mouth daily. (Patient not taking: Reported on 08/27/2023)   No facility-administered medications prior to visit.    Review of Systems  Constitutional: Negative.   HENT: Negative.    Eyes: Negative.   Respiratory: Negative.    Cardiovascular: Negative.   Gastrointestinal: Negative.   Genitourinary: Negative.   Skin: Negative.   Neurological: Negative.   Endo/Heme/Allergies: Negative.        Objective:   BP 129/83   Pulse 77   Temp 98.7 F (37.1 C)   Ht 5' 11 (1.803 m)   Wt (!) 388 lb (176 kg)   SpO2 98%   BMI 54.12 kg/m   Vitals:   08/27/23 0941  BP: 129/83  Pulse: 77  Temp: 98.7 F (37.1 C)  Height: 5' 11 (1.803 m)  Weight: (!) 388 lb (176 kg)  SpO2: 98%  BMI (Calculated): 54.14    Physical Exam Vitals reviewed.  Constitutional:      Appearance: Normal appearance.  HENT:     Head: Normocephalic.     Left Ear: There is no impacted cerumen.     Nose: Nose normal.     Mouth/Throat:     Mouth: Mucous membranes are moist.     Pharynx: No posterior oropharyngeal erythema.   Eyes:     Extraocular Movements: Extraocular movements intact.      Pupils: Pupils are equal, round, and reactive to light.    Cardiovascular:     Rate and Rhythm: Regular rhythm.     Chest Wall: PMI is not displaced.     Pulses: Normal pulses.     Heart sounds: Normal heart sounds. No murmur heard. Pulmonary:     Effort: Pulmonary effort is normal.     Breath sounds: Normal air entry. No rhonchi or rales.  Abdominal:     General: Abdomen is flat. Bowel sounds are normal. There is no distension.     Palpations: Abdomen is soft. There is no hepatomegaly, splenomegaly or mass.     Tenderness: There is no abdominal tenderness.     Comments: Large pannus   Musculoskeletal:        General: Normal range of motion.     Cervical back: Normal range of motion and neck supple.     Right lower leg: No edema.     Left lower leg: No edema.   Skin:    General: Skin is warm and dry.   Neurological:     General: No focal deficit present.     Mental Status: He is alert and oriented to person, place, and time.     Cranial Nerves: No cranial nerve deficit.     Motor: No weakness.   Psychiatric:        Mood and Affect: Mood normal.        Behavior: Behavior normal.      Results for orders placed or performed in visit on 08/27/23  POCT CBG (Fasting - Glucose)  Result Value Ref Range   Glucose Fasting, POC 90 70 - 99 mg/dL    Recent Results (from the past 2160 hours)  CBC     Status: None   Collection Time: 07/16/23 11:58 PM  Result Value Ref Range   WBC 7.9 4.0 - 10.5 K/uL   RBC 4.65 4.22 - 5.81 MIL/uL   Hemoglobin 13.3 13.0 - 17.0  g/dL   HCT 57.8 60.9 - 47.9 %   MCV 90.5 80.0 - 100.0 fL   MCH 28.6 26.0 - 34.0 pg   MCHC 31.6 30.0 - 36.0 g/dL   RDW 85.3 88.4 - 84.4 %   Platelets 172 150 - 400 K/uL   nRBC 0.0 0.0 - 0.2 %    Comment: Performed at [facility], [address] [place], [place] [address]  Basic metabolic panel     Status: Abnormal   Collection Time: 07/16/23 11:58 PM  Result Value Ref Range   Sodium 131 (L) 135 - 145  mmol/L   Potassium 4.1 3.5 - 5.1 mmol/L   Chloride 99 98 - 111 mmol/L   CO2 20 (L) 22 - 32 mmol/L   Glucose, Bld 122 (H) 70 - 99 mg/dL    Comment: Glucose reference range applies only to samples taken after fasting for at least 8 hours.   BUN 22 (H) 6 - 20 mg/dL   Creatinine, Ser 8.84 0.61 - 1.24 mg/dL   Calcium 9.8 8.9 - 89.6 mg/dL   GFR, Estimated >39 >39 mL/min    Comment: (NOTE) Calculated using the CKD-EPI Creatinine Equation (2021)    Anion gap 12 5 - 15    Comment: Performed at [facility], [address] [place], [place] [address]  Lactic acid, plasma     Status: None   Collection Time: 07/17/23  4:28 AM  Result Value Ref Range   Lactic Acid, Venous 1.0 0.5 - 1.9 mmol/L    Comment: Performed at [facility], [address] [place], [place] [address]  Urinalysis, Routine w reflex microscopic -Urine, Clean Catch     Status: Abnormal   Collection Time: 07/17/23  4:28 AM  Result Value Ref Range   Color, Urine YELLOW (A) YELLOW   APPearance CLEAR (A) CLEAR   Specific Gravity, Urine 1.018 1.005 - 1.030   pH 5.0 5.0 - 8.0   Glucose, UA NEGATIVE NEGATIVE mg/dL   Hgb urine dipstick NEGATIVE NEGATIVE   Bilirubin Urine NEGATIVE NEGATIVE   Ketones, ur NEGATIVE NEGATIVE mg/dL   Protein, ur NEGATIVE NEGATIVE mg/dL   Nitrite NEGATIVE NEGATIVE   Leukocytes,Ua NEGATIVE NEGATIVE    Comment: Performed at [facility], [address] [place], [place] [address]  Protime-INR     Status: Abnormal   Collection Time: 07/17/23  4:29 AM  Result Value Ref Range   Prothrombin  Time 28.2 (H) 11.4 - 15.2 seconds   INR 2.6 (H) 0.8 - 1.2    Comment: (NOTE) INR goal varies based on device and disease states. Performed at [facility], [address] [place], [place] [address]   Hemoglobin A1c     Status: Abnormal   Collection Time: 08/01/23  8:59 AM  Result Value Ref Range   Hgb A1c MFr Bld 6.3 (H) 4.8 - 5.6 %    Comment:          Prediabetes: 5.7 -  6.4          Diabetes: >6.4          Glycemic control for adults with diabetes: <7.0    Est. average glucose Bld gHb Est-mCnc 134 mg/dL  Lipid panel     Status: None   Collection Time: 08/01/23  9:04 AM  Result Value Ref Range   Cholesterol, Total 116 100 - 199 mg/dL   Triglycerides 67 0 - 149 mg/dL   HDL 47 >60 mg/dL   VLDL Cholesterol  Cal 14 5 - 40 mg/dL   LDL Chol Calc (NIH) 55 0 - 99 mg/dL   Chol/HDL Ratio 2.5 0.0 - 5.0 ratio    Comment:                                   T. Chol/HDL Ratio                                             Men  Women                               1/2 Avg.Risk  3.4    3.3                                   Avg.Risk  5.0    4.4                                2X Avg.Risk  9.6    7.1                                3X Avg.Risk 23.4   11.0   POCT CBG (Fasting - Glucose)     Status: None   Collection Time: 08/27/23  9:53 AM  Result Value Ref Range   Glucose Fasting, POC 90 70 - 99 mg/dL      Assessment & Plan:  As per problem list.  Problem List Items Addressed This Visit       Endocrine   Type 2 diabetes mellitus without complication (HCC) - Primary   Relevant Medications   tirzepatide  (MOUNJARO ) 5 MG/0.5ML Pen   Other Relevant Orders   POCT CBG (Fasting - Glucose) (Completed)     Other   Anxiety   Relevant Medications   LORazepam  (ATIVAN ) 1 MG tablet   LORazepam  (ATIVAN ) 1 MG tablet   Other Visit Diagnoses       Acute bronchitis due to other specified organisms       Relevant Medications   budesonide -formoterol  (SYMBICORT ) 80-4.5 MCG/ACT inhaler   doxycycline  (VIBRA -TABS) 100 MG tablet     Seasonal allergic rhinitis due to pollen       Relevant Medications   fluticasone  (FLONASE ) 50 MCG/ACT nasal spray   cetirizine  (ZYRTEC  ALLERGY) 10 MG tablet     Pain in unspecified knee       Relevant Medications   celecoxib  (CELEBREX ) 400 MG capsule       Return in about 3 months (around 11/27/2023) for fu with labs prior.   Total time  spent: 20 minutes  Sherrill Cinderella Perry, MD  08/27/2023   This document may have been prepared by [facility] Voice Recognition software and as such may include unintentional dictation errors.

## 2023-08-28 ENCOUNTER — Other Ambulatory Visit: Payer: Self-pay | Admitting: Internal Medicine

## 2023-08-29 ENCOUNTER — Other Ambulatory Visit: Payer: Self-pay | Admitting: Cardiovascular Disease

## 2023-08-31 ENCOUNTER — Encounter: Payer: Self-pay | Admitting: Internal Medicine

## 2023-09-02 ENCOUNTER — Other Ambulatory Visit: Payer: Self-pay | Admitting: Cardiovascular Disease

## 2023-09-02 ENCOUNTER — Other Ambulatory Visit: Payer: Self-pay

## 2023-09-02 MED ORDER — ONDANSETRON 4 MG PO TBDP: 4.0000 mg | ORAL_TABLET | Freq: Three times a day (TID) | ORAL | 3 refills | Status: DC | PRN

## 2023-09-03 MED ORDER — WARFARIN SODIUM 2 MG PO TABS: ORAL_TABLET | ORAL | 1 refills | Status: DC

## 2023-09-19 ENCOUNTER — Other Ambulatory Visit: Payer: Self-pay | Admitting: Cardiovascular Disease

## 2023-09-19 DIAGNOSIS — I4891 Unspecified atrial fibrillation: Secondary | ICD-10-CM

## 2023-09-19 DIAGNOSIS — E785 Hyperlipidemia, unspecified: Secondary | ICD-10-CM

## 2023-09-22 ENCOUNTER — Other Ambulatory Visit: Payer: Self-pay | Admitting: Internal Medicine

## 2023-09-22 DIAGNOSIS — I10 Essential (primary) hypertension: Secondary | ICD-10-CM

## 2023-09-23 ENCOUNTER — Ambulatory Visit: Admitting: Internal Medicine

## 2023-10-14 DIAGNOSIS — R6883 Chills (without fever): Secondary | ICD-10-CM | POA: Diagnosis not present

## 2023-10-14 DIAGNOSIS — Z7901 Long term (current) use of anticoagulants: Secondary | ICD-10-CM | POA: Diagnosis not present

## 2023-10-14 DIAGNOSIS — R300 Dysuria: Secondary | ICD-10-CM | POA: Diagnosis not present

## 2023-10-14 DIAGNOSIS — R110 Nausea: Secondary | ICD-10-CM | POA: Diagnosis not present

## 2023-10-14 DIAGNOSIS — N390 Urinary tract infection, site not specified: Secondary | ICD-10-CM | POA: Diagnosis not present

## 2023-10-30 ENCOUNTER — Encounter: Payer: Self-pay | Admitting: Internal Medicine

## 2023-11-04 ENCOUNTER — Other Ambulatory Visit: Payer: Self-pay

## 2023-11-04 DIAGNOSIS — E119 Type 2 diabetes mellitus without complications: Secondary | ICD-10-CM | POA: Diagnosis not present

## 2023-11-04 DIAGNOSIS — I4811 Longstanding persistent atrial fibrillation: Secondary | ICD-10-CM

## 2023-11-04 DIAGNOSIS — I10 Essential (primary) hypertension: Secondary | ICD-10-CM

## 2023-11-04 DIAGNOSIS — E785 Hyperlipidemia, unspecified: Secondary | ICD-10-CM | POA: Diagnosis not present

## 2023-11-05 LAB — LIPID PANEL
Chol/HDL Ratio: 3.3 ratio (ref 0.0–5.0)
Cholesterol, Total: 221 mg/dL — ABNORMAL HIGH (ref 100–199)
HDL: 68 mg/dL (ref >39)
LDL Chol Calc (NIH): 125 mg/dL — ABNORMAL HIGH (ref 0–99)
Triglycerides: 161 mg/dL — ABNORMAL HIGH (ref 0–149)
VLDL Cholesterol Cal: 28 mg/dL (ref 5–40)

## 2023-11-05 LAB — CMP14+EGFR
ALT: 31 IU/L (ref 0–44)
AST: 37 IU/L (ref 0–40)
Albumin: 4.4 g/dL (ref 3.8–4.9)
Alkaline Phosphatase: 74 IU/L (ref 44–121)
BUN/Creatinine Ratio: 28 — ABNORMAL HIGH (ref 9–20)
BUN: 23 mg/dL (ref 6–24)
Bilirubin Total: 0.6 mg/dL (ref 0.0–1.2)
CO2: 17 mmol/L — ABNORMAL LOW (ref 20–29)
Calcium: 10.5 mg/dL — ABNORMAL HIGH (ref 8.7–10.2)
Chloride: 102 mmol/L (ref 96–106)
Creatinine, Ser: 0.83 mg/dL (ref 0.76–1.27)
Globulin, Total: 2.5 g/dL (ref 1.5–4.5)
Glucose: 89 mg/dL (ref 70–99)
Potassium: 4.8 mmol/L (ref 3.5–5.2)
Sodium: 138 mmol/L (ref 134–144)
Total Protein: 6.9 g/dL (ref 6.0–8.5)
eGFR: 106 mL/min/1.73 (ref >59)

## 2023-11-05 LAB — HEMOGLOBIN A1C
Est. average glucose Bld gHb Est-mCnc: 120 mg/dL
Hgb A1c MFr Bld: 5.8 % — ABNORMAL HIGH (ref 4.8–5.6)

## 2023-11-05 LAB — TSH: TSH: 2.17 u[IU]/mL (ref 0.450–4.500)

## 2023-11-14 ENCOUNTER — Encounter: Payer: Self-pay | Admitting: Internal Medicine

## 2023-11-15 ENCOUNTER — Other Ambulatory Visit: Payer: Self-pay

## 2023-11-15 ENCOUNTER — Emergency Department

## 2023-11-15 ENCOUNTER — Inpatient Hospital Stay
Admission: EM | Admit: 2023-11-15 | Discharge: 2023-11-17 | DRG: 872 | Disposition: A | Discharge status: Home / Self Care

## 2023-11-15 ENCOUNTER — Emergency Department: Admit: 2023-11-15

## 2023-11-15 DIAGNOSIS — Z6841 Body Mass Index (BMI) 40.0 and over, adult: Secondary | ICD-10-CM | POA: Diagnosis not present

## 2023-11-15 DIAGNOSIS — Z7984 Long term (current) use of oral hypoglycemic drugs: Secondary | ICD-10-CM | POA: Diagnosis not present

## 2023-11-15 DIAGNOSIS — I482 Chronic atrial fibrillation, unspecified: Secondary | ICD-10-CM | POA: Diagnosis not present

## 2023-11-15 DIAGNOSIS — D696 Thrombocytopenia, unspecified: Secondary | ICD-10-CM | POA: Diagnosis not present

## 2023-11-15 DIAGNOSIS — R0989 Other specified symptoms and signs involving the circulatory and respiratory systems: Secondary | ICD-10-CM | POA: Diagnosis not present

## 2023-11-15 DIAGNOSIS — I350 Nonrheumatic aortic (valve) stenosis: Secondary | ICD-10-CM | POA: Diagnosis not present

## 2023-11-15 DIAGNOSIS — Z8249 Family history of ischemic heart disease and other diseases of the circulatory system: Secondary | ICD-10-CM

## 2023-11-15 DIAGNOSIS — N3001 Acute cystitis with hematuria: Secondary | ICD-10-CM | POA: Diagnosis present

## 2023-11-15 DIAGNOSIS — R110 Nausea: Secondary | ICD-10-CM | POA: Diagnosis not present

## 2023-11-15 DIAGNOSIS — E785 Hyperlipidemia, unspecified: Secondary | ICD-10-CM | POA: Diagnosis not present

## 2023-11-15 DIAGNOSIS — K439 Ventral hernia without obstruction or gangrene: Secondary | ICD-10-CM | POA: Diagnosis not present

## 2023-11-15 DIAGNOSIS — Z87891 Personal history of nicotine dependence: Secondary | ICD-10-CM | POA: Diagnosis not present

## 2023-11-15 DIAGNOSIS — I480 Paroxysmal atrial fibrillation: Secondary | ICD-10-CM | POA: Diagnosis present

## 2023-11-15 DIAGNOSIS — I110 Hypertensive heart disease with heart failure: Secondary | ICD-10-CM | POA: Diagnosis not present

## 2023-11-15 DIAGNOSIS — B9620 Unspecified Escherichia coli [E. coli] as the cause of diseases classified elsewhere: Secondary | ICD-10-CM | POA: Diagnosis not present

## 2023-11-15 DIAGNOSIS — E119 Type 2 diabetes mellitus without complications: Secondary | ICD-10-CM | POA: Diagnosis present

## 2023-11-15 DIAGNOSIS — N390 Urinary tract infection, site not specified: Secondary | ICD-10-CM

## 2023-11-15 DIAGNOSIS — N179 Acute kidney failure, unspecified: Secondary | ICD-10-CM | POA: Diagnosis not present

## 2023-11-15 DIAGNOSIS — E871 Hypo-osmolality and hyponatremia: Secondary | ICD-10-CM | POA: Diagnosis not present

## 2023-11-15 DIAGNOSIS — R578 Other shock: Secondary | ICD-10-CM | POA: Diagnosis not present

## 2023-11-15 DIAGNOSIS — Z9884 Bariatric surgery status: Secondary | ICD-10-CM

## 2023-11-15 DIAGNOSIS — R710 Precipitous drop in hematocrit: Secondary | ICD-10-CM | POA: Diagnosis not present

## 2023-11-15 DIAGNOSIS — Z7951 Long term (current) use of inhaled steroids: Secondary | ICD-10-CM | POA: Diagnosis not present

## 2023-11-15 DIAGNOSIS — R6520 Severe sepsis without septic shock: Secondary | ICD-10-CM | POA: Diagnosis not present

## 2023-11-15 DIAGNOSIS — R0602 Shortness of breath: Secondary | ICD-10-CM | POA: Diagnosis not present

## 2023-11-15 DIAGNOSIS — Z952 Presence of prosthetic heart valve: Secondary | ICD-10-CM

## 2023-11-15 DIAGNOSIS — E860 Dehydration: Secondary | ICD-10-CM | POA: Diagnosis not present

## 2023-11-15 DIAGNOSIS — I959 Hypotension, unspecified: Secondary | ICD-10-CM

## 2023-11-15 DIAGNOSIS — A419 Sepsis, unspecified organism: Secondary | ICD-10-CM | POA: Diagnosis not present

## 2023-11-15 DIAGNOSIS — Z833 Family history of diabetes mellitus: Secondary | ICD-10-CM

## 2023-11-15 DIAGNOSIS — Z7901 Long term (current) use of anticoagulants: Secondary | ICD-10-CM

## 2023-11-15 DIAGNOSIS — Z87440 Personal history of urinary (tract) infections: Secondary | ICD-10-CM

## 2023-11-15 DIAGNOSIS — Z79899 Other long term (current) drug therapy: Secondary | ICD-10-CM

## 2023-11-15 LAB — COMPREHENSIVE METABOLIC PANEL WITH GFR
ALT: 24 U/L (ref 0–44)
AST: 35 U/L (ref 15–41)
Albumin: 3.4 g/dL — ABNORMAL LOW (ref 3.5–5.0)
Alkaline Phosphatase: 59 U/L (ref 38–126)
Anion gap: 13 (ref 5–15)
BUN: 37 mg/dL — ABNORMAL HIGH (ref 6–20)
CO2: 21 mmol/L — ABNORMAL LOW (ref 22–32)
Calcium: 9.4 mg/dL (ref 8.9–10.3)
Chloride: 102 mmol/L (ref 98–111)
Creatinine, Ser: 1.97 mg/dL — ABNORMAL HIGH (ref 0.61–1.24)
GFR, Estimated: 40 mL/min — ABNORMAL LOW (ref >60)
Glucose, Bld: 116 mg/dL — ABNORMAL HIGH (ref 70–99)
Potassium: 4.8 mmol/L (ref 3.5–5.1)
Sodium: 136 mmol/L (ref 135–145)
Total Bilirubin: 2.1 mg/dL — ABNORMAL HIGH (ref 0.0–1.2)
Total Protein: 7 g/dL (ref 6.5–8.1)

## 2023-11-15 LAB — URINALYSIS, W/ REFLEX TO CULTURE (INFECTION SUSPECTED)
Bilirubin Urine: NEGATIVE
Glucose, UA: NEGATIVE mg/dL
Ketones, ur: NEGATIVE mg/dL
Nitrite: NEGATIVE
Protein, ur: 30 mg/dL — AB
Specific Gravity, Urine: 1.029 (ref 1.005–1.030)
WBC, UA: >50 WBC/hpf (ref 0–5)
pH: 5 (ref 5.0–8.0)

## 2023-11-15 LAB — CBC WITH DIFFERENTIAL/PLATELET
Abs Immature Granulocytes: 0.15 K/uL — ABNORMAL HIGH (ref 0.00–0.07)
Basophils Absolute: 0.1 K/uL (ref 0.0–0.1)
Basophils Relative: 1 %
Eosinophils Absolute: 0 K/uL (ref 0.0–0.5)
Eosinophils Relative: 0 %
HCT: 41.5 % (ref 39.0–52.0)
Hemoglobin: 13.3 g/dL (ref 13.0–17.0)
Immature Granulocytes: 1 %
Lymphocytes Relative: 5 %
Lymphs Abs: 0.5 K/uL — ABNORMAL LOW (ref 0.7–4.0)
MCH: 28.4 pg (ref 26.0–34.0)
MCHC: 32 g/dL (ref 30.0–36.0)
MCV: 88.7 fL (ref 80.0–100.0)
Monocytes Absolute: 0.5 K/uL (ref 0.1–1.0)
Monocytes Relative: 5 %
Neutro Abs: 9.2 K/uL — ABNORMAL HIGH (ref 1.7–7.7)
Neutrophils Relative %: 88 %
Platelets: 143 K/uL — ABNORMAL LOW (ref 150–400)
RBC: 4.68 MIL/uL (ref 4.22–5.81)
RDW: 16 % — ABNORMAL HIGH (ref 11.5–15.5)
WBC: 10.4 K/uL (ref 4.0–10.5)
nRBC: 0 % (ref 0.0–0.2)

## 2023-11-15 LAB — PROTIME-INR
INR: 3.4 — ABNORMAL HIGH (ref 0.8–1.2)
Prothrombin Time: 36.2 s — ABNORMAL HIGH (ref 11.4–15.2)

## 2023-11-15 LAB — TROPONIN I (HIGH SENSITIVITY)
Troponin I (High Sensitivity): 10 ng/L (ref <18)
Troponin I (High Sensitivity): 7 ng/L (ref <18)

## 2023-11-15 LAB — CK: Total CK: 54 U/L (ref 49–397)

## 2023-11-15 LAB — LACTIC ACID, PLASMA
Lactic Acid, Venous: 1.7 mmol/L (ref 0.5–1.9)
Lactic Acid, Venous: 1.7 mmol/L (ref 0.5–1.9)

## 2023-11-15 LAB — BRAIN NATRIURETIC PEPTIDE: B Natriuretic Peptide: 161.7 pg/mL — ABNORMAL HIGH (ref 0.0–100.0)

## 2023-11-15 LAB — TSH: TSH: 2.826 u[IU]/mL (ref 0.350–4.500)

## 2023-11-15 LAB — MAGNESIUM: Magnesium: 1.5 mg/dL — ABNORMAL LOW (ref 1.7–2.4)

## 2023-11-15 LAB — PROCALCITONIN: Procalcitonin: 2.16 ng/mL

## 2023-11-15 MED ORDER — SODIUM CHLORIDE 0.9 % IV SOLN: 2.0000 g | INTRAVENOUS | Status: DC

## 2023-11-15 MED ORDER — ONDANSETRON HCL 4 MG PO TABS
4.0000 mg | ORAL_TABLET | Freq: Four times a day (QID) | ORAL | Status: DC | PRN
  Administered 2023-11-15: 4 mg via ORAL
  Filled 2023-11-15: qty 1

## 2023-11-15 MED ORDER — LORAZEPAM 1 MG PO TABS
1.0000 mg | ORAL_TABLET | Freq: Two times a day (BID) | ORAL | Status: DC
  Administered 2023-11-15 – 2023-11-16 (×2): 1 mg via ORAL
  Filled 2023-11-15 (×4): qty 1

## 2023-11-15 MED ORDER — OXYCODONE HCL 5 MG PO TABS
5.0000 mg | ORAL_TABLET | ORAL | Status: DC
  Administered 2023-11-15: 5 mg via ORAL
  Filled 2023-11-15: qty 1

## 2023-11-15 MED ORDER — OXYCODONE HCL 5 MG PO TABS: 5.0000 mg | ORAL_TABLET | ORAL | Status: DC | PRN

## 2023-11-15 MED ORDER — SODIUM CHLORIDE 0.9 % IV BOLUS: 500.0000 mL | Freq: Once | INTRAVENOUS | Status: DC

## 2023-11-15 MED ORDER — ORAL CARE MOUTH RINSE: 15.0000 mL | OROMUCOSAL | Status: DC | PRN

## 2023-11-15 MED ORDER — LACTATED RINGERS IV SOLN: INTRAVENOUS | Status: AC

## 2023-11-15 MED ORDER — CEFTRIAXONE SODIUM 2 G IJ SOLR
2.0000 g | Freq: Once | INTRAMUSCULAR | Status: AC
  Administered 2023-11-15: 2 g via INTRAVENOUS
  Filled 2023-11-15: qty 20

## 2023-11-15 MED ORDER — SODIUM CHLORIDE 0.9 % IV BOLUS
1000.0000 mL | Freq: Once | INTRAVENOUS | Status: AC
  Administered 2023-11-15: 1000 mL via INTRAVENOUS

## 2023-11-15 MED ORDER — MAGNESIUM OXIDE 400 MG PO TABS
800.0000 mg | ORAL_TABLET | Freq: Two times a day (BID) | ORAL | Status: DC
  Administered 2023-11-15 – 2023-11-17 (×4): 800 mg via ORAL
  Filled 2023-11-15 (×8): qty 2

## 2023-11-15 MED ORDER — MAGNESIUM SULFATE IN D5W 1-5 GM/100ML-% IV SOLN
1.0000 g | Freq: Once | INTRAVENOUS | Status: AC
  Administered 2023-11-15: 1 g via INTRAVENOUS
  Filled 2023-11-15: qty 100

## 2023-11-15 MED ORDER — LACTATED RINGERS IV BOLUS
500.0000 mL | Freq: Once | INTRAVENOUS | Status: AC
  Administered 2023-11-15: 500 mL via INTRAVENOUS

## 2023-11-15 MED ORDER — SODIUM CHLORIDE 0.9 % IV BOLUS
500.0000 mL | Freq: Once | INTRAVENOUS | Status: AC
  Administered 2023-11-15: 500 mL via INTRAVENOUS

## 2023-11-15 MED ORDER — NOREPINEPHRINE 4 MG/250ML-% IV SOLN: 0.0000 ug/min | INTRAVENOUS | Status: DC

## 2023-11-15 MED ORDER — LORAZEPAM 0.5 MG PO TABS
0.5000 mg | ORAL_TABLET | ORAL | Status: DC | PRN
  Administered 2023-11-15 – 2023-11-16 (×3): 0.5 mg via ORAL
  Filled 2023-11-15 (×3): qty 1

## 2023-11-15 MED ORDER — SODIUM CHLORIDE 0.9 % IV SOLN
2.0000 g | INTRAVENOUS | Status: DC
  Administered 2023-11-16 – 2023-11-17 (×2): 2 g via INTRAVENOUS
  Filled 2023-11-15 (×2): qty 20

## 2023-11-15 MED ORDER — ONDANSETRON HCL 4 MG/2ML IJ SOLN
4.0000 mg | Freq: Four times a day (QID) | INTRAMUSCULAR | Status: DC | PRN
  Administered 2023-11-17: 4 mg via INTRAVENOUS
  Filled 2023-11-15: qty 2

## 2023-11-15 MED ORDER — OXYCODONE HCL 5 MG PO TABS
5.0000 mg | ORAL_TABLET | Freq: Four times a day (QID) | ORAL | Status: DC | PRN
  Administered 2023-11-15 – 2023-11-16 (×4): 5 mg via ORAL
  Filled 2023-11-15 (×5): qty 1

## 2023-11-15 MED ORDER — ROSUVASTATIN CALCIUM 10 MG PO TABS
20.0000 mg | ORAL_TABLET | Freq: Every day | ORAL | Status: DC
  Administered 2023-11-15 – 2023-11-16 (×2): 20 mg via ORAL
  Filled 2023-11-15 (×2): qty 2

## 2023-11-15 MED ORDER — METOPROLOL TARTRATE 5 MG/5ML IV SOLN: 5.0000 mg | INTRAVENOUS | Status: DC | PRN

## 2023-11-15 MED ORDER — ACETAMINOPHEN 325 MG PO TABS: 650.0000 mg | ORAL_TABLET | ORAL | Status: DC | PRN

## 2023-11-15 MED ORDER — FLUTICASONE FUROATE-VILANTEROL 100-25 MCG/ACT IN AEPB
1.0000 | INHALATION_SPRAY | Freq: Every day | RESPIRATORY_TRACT | Status: DC
  Filled 2023-11-15: qty 28

## 2023-11-15 MED ORDER — SENNOSIDES-DOCUSATE SODIUM 8.6-50 MG PO TABS: 1.0000 | ORAL_TABLET | Freq: Every evening | ORAL | Status: DC | PRN

## 2023-11-15 MED ORDER — LORATADINE 10 MG PO TABS
10.0000 mg | ORAL_TABLET | Freq: Every day | ORAL | Status: DC
  Administered 2023-11-15 – 2023-11-16 (×2): 10 mg via ORAL
  Filled 2023-11-15 (×3): qty 1

## 2023-11-15 MED ORDER — LORAZEPAM 1 MG PO TABS
1.0000 mg | ORAL_TABLET | Freq: Two times a day (BID) | ORAL | Status: DC
Start: 2023-11-15 — End: 2023-11-15

## 2023-11-15 MED ORDER — WARFARIN - PHARMACIST DOSING INPATIENT
Freq: Every day | Status: DC
Start: 2023-11-15 — End: 2023-11-17
  Filled 2023-11-15: qty 1

## 2023-11-15 MED ORDER — WARFARIN SODIUM 1 MG PO TABS
1.0000 mg | ORAL_TABLET | Freq: Once | ORAL | Status: AC
  Administered 2023-11-15: 1 mg via ORAL
  Filled 2023-11-15: qty 1

## 2023-11-15 NOTE — ED Notes (Signed)
 Patient requested for US  guided IV to be taken out. This RN explained the importance of having two working IV's while here. Patient insisted on taking the IV out and verbalized that this IV was very painful. This RN observed no signs of infiltration or phlebitis. IV removed by this RN.

## 2023-11-15 NOTE — Consult Note (Addendum)
 PHARMACY - ANTICOAGULATION CONSULT NOTE  Pharmacy Consult for Warfarin Indication: atrial fibrillation and mAVR  Allergies  Allergen Reactions   Lovenox  [Enoxaparin  Sodium] Itching   Patient Measurements: Height: 5' 7 (170.2 cm) Weight: (!) 176 kg (388 lb) IBW/kg (Calculated) : 66.1 HEPARIN  DW (KG): 110.6  Labs: Recent Labs    11/15/23 0904 11/15/23 1057  HGB 13.3  --   HCT 41.5  --   PLT 143*  --   LABPROT 36.2*  --   INR 3.4*  --   CREATININE 1.97*  --   CKTOTAL 54  --   TROPONINIHS 10 7    Estimated Creatinine Clearance: 69.1 mL/min (A) (by C-G formula based on SCr of 1.97 mg/dL (H)).   Medical History: Past Medical History:  Diagnosis Date   Anxiety    Aortic valve stenosis    Atrial fibrillation (HCC)    CHF (congestive heart failure) (HCC)    Depression    Diabetes mellitus without complication (HCC)    Hypertension     Medications:  Warfarin 2 mg every evening. Last patient reported dose was 11/14/23  Assessment: 53 y/o M with medical history including Afib and mAVR presenting with concerns for sepsis complicated by AKI. Pharmacy consulted to dose warfarin while admitted.  His Tbili is 2.1 which is elevated from 0.6 on 11/04/23 which may indicate some liver dysfunction. Albumin is 3.4 from 4.4 on 11/04/23. Baseline CBC with normal H&H, platelets mildly low  DDIs: Ceftriaxone . Home amiodarone  has not been continued at the time of this note  Date INR Plan  9/19 3.4 1 mg    Goal of Therapy:  INR 2.5 - 3.5  Plan:  --INR is at upper limit of goal --Will give reduced dose of 1 mg tonight. Suspect patient may be more sensitive to warfarin given antibiotics and organ dysfunction at this time --Daily INR per protocol  Marolyn KATHEE Mare 11/15/2023,1:12 PM

## 2023-11-15 NOTE — ED Notes (Signed)
 Patient transported to CT

## 2023-11-15 NOTE — Consult Note (Signed)
 NAME:  Russell Ray, MRN:  [id number], DOB:  1971/01/22, LOS: 0 ADMISSION DATE:  11/15/2023, CONSULTATION DATE:  11/15/2023 REFERRING MD:  Dr. Clotilda Punter CHIEF COMPLAINT:  Dysuria   Brief Pt Description / Synopsis:  Russell Ray is a 53 y.o male with a PMHx significant for aortic valve replacement (February 2017) with mechanical valve on Coumadin , obesity, gastric sleeve, CHF, HTN, and type II DM. He presented to the ED on 9/19 with UTI and hypotension which was fluid responsive, along with AKI.  History of Present Illness:        Russell Ray is a 53 y.o male with a PMHx significant for aortic valve replacement (February 2017) with mechanical valve on Coumadin , obesity, gastric sleeve with >150 pounds of weight loss, CHF, HTN, and type II DM. Of note, he had a complicated UTI on 10/14/23  treated with Cefdinir for 10 days. He presented to the ED this morning with 2-3 days of feeling bad. He noted symptoms consistent with a UTI, including frequency with small amounts of dark urine, back pain, and chills. While in the ED, the patient was noted to be hypotensive with a BP of 72/40. Due to patient's body habitus, the cuff was positioned on the lower arm, with repeat BP's also done on the contralateral arm and manually. The patient has otherwise been alert, oriented, and afebrile. UA consisted with UTI, and urine sent for culture. IV Rocephin  was started. The patient continued to be hypotensive, so PCCM was consulted for potential need for vasopressors.       Upon seeing the patient in the ED, he appears comfortable and in no acute distress. His blood pressure was 105/69 (MAP 77) without the addition of Levophed , so was responsive to the 2L of IV fluids given in the ED. The patient denied any history of recurrent UTIs prior to the last month. Due to improvement in his blood pressure, the patient will be admitted to the progressive unit.   ED Course: Initial Vital Signs: BP 72/42, pulse 77, RR 20, temp 98.2 F,  SpO2 96% Significant Labs: BNP 161, INR 3.4, UA consistent with UTI (3+ WBC and 2+ RBC), procal 2.1 Imaging Chest X-ray>> IMPRESSION: 1. No acute cardiopulmonary process. 2. Low lung volumes. Medications Administered: IV Rocephin , IV replacement of mildly low magnesium , 2 L IVF   Please see Significant Hospital Events section below for full detailed hospital course.   Pertinent  Medical History  Aortic valve replacement (February 2017) with mechanical valve on Coumadin  Obesity Status post gastric sleeve CHF HTN Type II DM  Micro Data:  9/19: Urine culture >> 9/19: Blood culture x2>>    Antimicrobials:  9/19: IV Rocephin    Significant Hospital Events: Including procedures, antibiotic start and stop dates in addition to other pertinent events   9/19: In the ED with UTI, hypotension, PCCM consulted for potential vasopressors. Blood pressure improved with fluids, vasopressor not needed at this time. Receiving IV Rocephin .  TRH to admit, PCCM will sign off.  Interim History / Subjective:  As outlined above under Significant Hospital Events section  Objective   Blood pressure (!) 115/58, pulse 92, temperature 98.2 F (36.8 C), resp. rate 15, height 5' 7 (1.702 m), weight (!) 176 kg, SpO2 (!) 67%.       No intake or output data in the 24 hours ending 11/15/23 1149 Filed Weights   11/15/23 0824  Weight: (!) 176 kg    Examination: General: Obese male sitting comfortably in bed, NAD HENT:  Normocephalic, atraumatic, neck supple Lungs: Normal effort, clear to auscultation bilaterally Cardiovascular: RRR, no peripheral edema noted Abdomen: Obese with pannus, soft, non-tender with + BS Extremities: No rashes, lesions Neuro: Awake, alert, following commands, no focal deficits noted GU: Urinary frequency, loose skin covering penis   Resolved Hospital Problem list   9/19: Hypotension that has resolved with IV fluids  Assessment & Plan:  #UTI #Concern for development  of sepsis  -Continue IV Rocephin  pending cultures & sensitivities -CT abdomen/pelvis w/o contrast to evaluate for prostatitis vs intraabdominal infection   -Monitor fever curve, VS -Trend WBC's & Procalcitonin -Lactic currently WNL at 1.7, WBCs WNL at 10.4 -Follow cultures as above -Monitor for progression to sepsis   #Chronic CHF without acute decompensation  #Chronic A-fib #HTN #Mechanical Aortic valve on Coumadin  -BNP elevated, HS Trop WNL -Get repeat Echo today. Last echo done on 11/12/18 with EF of 60-65% -Pharmacy to help dose Coumadin  ~ could consider Heparin  gtt for anticoagulation with AKI, but will defer to primary team   #AKI  -Appears to be prerenal  -Continue maintenance IV fluids for gentle hydration  -Monitor I&O's / urinary output -Follow BMP -Ensure adequate renal perfusion -Avoid nephrotoxic agents as able -Replace electrolytes as indicated ~ Pharmacy following for assistance with electrolyte replacement    Best Practice (right click and Reselect all SmartList Selections daily)   Diet/type: clear liquids DVT prophylaxis: Coumadin  GI prophylaxis: N/A Lines: N/A Foley:  N/A Code Status:  full code Last date of multidisciplinary goals of care discussion [N/A]  9/19: Pt updated at bedside on plan of care.  Labs   CBC: Recent Labs  Lab 11/15/23 0904  WBC 10.4  NEUTROABS 9.2*  HGB 13.3  HCT 41.5  MCV 88.7  PLT 143*    Basic Metabolic Panel: Recent Labs  Lab 11/15/23 0904  NA 136  K 4.8  CL 102  CO2 21*  GLUCOSE 116*  BUN 37*  CREATININE 1.97*  CALCIUM  9.4  MG 1.5*   GFR: Estimated Creatinine Clearance: 69.1 mL/min (A) (by C-G formula based on SCr of 1.97 mg/dL (H)). Recent Labs  Lab 11/15/23 0904 11/15/23 0916 11/15/23 1057  PROCALCITON 2.16  --   --   WBC 10.4  --   --   LATICACIDVEN  --  1.7 1.7    Liver Function Tests: Recent Labs  Lab 11/15/23 0904  AST 35  ALT 24  ALKPHOS 59  BILITOT 2.1*  PROT 7.0  ALBUMIN 3.4*    No results for input(s): LIPASE, AMYLASE in the last 168 hours. No results for input(s): AMMONIA in the last 168 hours.  ABG No results found for: PHART, PCO2ART, PO2ART, HCO3, TCO2, ACIDBASEDEF, O2SAT   Coagulation Profile: Recent Labs  Lab 11/15/23 0904  INR 3.4*    Cardiac Enzymes: Recent Labs  Lab 11/15/23 0904  CKTOTAL 54    HbA1C: Hgb A1c MFr Bld  Date/Time Value Ref Range Status  11/04/2023 11:59 AM 5.8 (H) 4.8 - 5.6 % Final    Comment:             Prediabetes: 5.7 - 6.4          Diabetes: >6.4          Glycemic control for adults with diabetes: <7.0   08/01/2023 08:59 AM 6.3 (H) 4.8 - 5.6 % Final    Comment:             Prediabetes: 5.7 - 6.4  Diabetes: >6.4          Glycemic control for adults with diabetes: <7.0     CBG: No results for input(s): GLUCAP in the last 168 hours.  Review of Systems:   Positives in BOLD: Gen: Denies fever, chills, weight change, fatigue, night sweats HEENT: Denies blurred vision, double vision, hearing loss, tinnitus, sinus congestion, rhinorrhea, sore throat, neck stiffness, dysphagia PULM: Denies shortness of breath, cough, sputum production, hemoptysis, wheezing CV: Denies chest pain, edema, orthopnea, paroxysmal nocturnal dyspnea, palpitations GI: Denies abdominal pain, nausea, vomiting, diarrhea, hematochezia, melena, constipation, change in bowel habits GU: Denies dysuria, hematuria, polyuria, oliguria, urethral discharge Endocrine: Denies hot or cold intolerance, polyuria, polyphagia or appetite change Derm: Denies rash, dry skin, scaling or peeling skin change, loose skin Heme: Denies easy bruising, bleeding, bleeding gums Neuro: Denies headache, numbness, weakness, slurred speech, loss of memory or consciousness   Past Medical History:  He,  has a past medical history of Anxiety, Aortic valve stenosis, Atrial fibrillation (HCC), CHF (congestive heart failure) (HCC), Depression,  Diabetes mellitus without complication (HCC), and Hypertension.   Surgical History:   Past Surgical History:  Procedure Laterality Date   APPLICATION OF WOUND VAC     CARDIAC CATHETERIZATION Right 04/22/2015   Procedure: Right/Left Heart Cath and Coronary Angiography;  Surgeon: Denyse DELENA Bathe, MD;  Location: [facility];  Service: Cardiovascular;  Laterality: Right;   CARDIAC VALVE REPLACEMENT     CARDIOVERSION N/A 05/14/2017   Procedure: CARDIOVERSION;  Surgeon: Bathe Denyse DELENA, MD;  Location: [facility];  Service: Cardiovascular;  Laterality: N/A;   COLONOSCOPY N/A 10/21/2020   Procedure: COLONOSCOPY;  Surgeon: Toledo, Ladell POUR, MD;  Location: [facility] END[facility];  Service: Gastroenterology;  Laterality: N/A;   COLONOSCOPY WITH PROPOFOL  N/A 10/05/2016   Procedure: COLONOSCOPY WITH PROPOFOL ;  Surgeon: Viktoria Lamar DASEN, MD;  Location: [facility] ENDOSCOPY;  Service: Endoscopy;  Laterality: N/A;   debridement sternum     ELECTROPHYSIOLOGIC STUDY N/A 05/05/2015   Procedure: Cardioversion;  Surgeon: Denyse DELENA Bathe, MD;  Location: [facility] ORS;  Service: Cardiovascular;  Laterality: N/A;   ELECTROPHYSIOLOGIC STUDY N/A 05/17/2015   Procedure: CARDIOVERSION;  Surgeon: Denyse DELENA Bathe, MD;  Location: [facility] OR[facility];  Service: Cardiovascular;  Laterality: N/A;   ESOPHAGOGASTRODUODENOSCOPY (EGD) WITH PROPOFOL  N/A 10/05/2016   Procedure: ESOPHAGOGASTRODUODENOSCOPY (EGD) WITH PROPOFOL ;  Surgeon: Viktoria Lamar DASEN, MD;  Location: [facility] ENDOSCOPY;  Service: Endoscopy;  Laterality: N/A;   GREATER OMENTAL FLAP CLOSURE     HERNIA REPAIR     X3   VALVE REPLACEMENT     VENTRAL HERNIA REPAIR N/A 12/01/2016   Procedure: HERNIA REPAIR VENTRAL ADULT with mesh;  Surgeon: Jordis Laneta FALCON, MD;  Location: [facility] ORS;  Service: General;  Laterality: N/A;     Social History:   reports that he has quit smoking. His smoking use included cigarettes. He has a 56 pack-year smoking history. He has never used smokeless tobacco. He reports that  he does not drink alcohol and does not use drugs.   Family History:  His family history includes Diabetes in his maternal grandmother; Hypertension in his maternal grandmother and mother.   Allergies Allergies  Allergen Reactions   Lovenox  [Enoxaparin  Sodium] Itching     Home Medications  Prior to Admission medications   Medication Sig Start Date End Date Taking? Authorizing Provider  acetaminophen  (TYLENOL ) 325 MG tablet Take 650 mg by mouth every 4 (four) hours as needed for moderate pain.    [provider]  amiodarone  (PACERONE ) 200 MG tablet TAKE 1 TABLET BY MOUTH TWICE A Ray 08/29/23   Fernand Denyse LABOR, MD  budesonide -formoterol  (SYMBICORT ) 80-4.5 MCG/ACT inhaler Inhale 2 puffs into the lungs 2 (two) times daily. 08/27/23 12/09/23  Albina GORMAN Dine, MD  cephALEXin  (KEFLEX ) 500 MG capsule Take 1 capsule (500 mg total) by mouth 3 (three) times daily. Patient not taking: Reported on 08/27/2023 07/17/23   Robinette Vermell PARAS, MD  cetirizine  (ZYRTEC  ALLERGY) 10 MG tablet Take 1 tablet (10 mg total) by mouth daily. 08/27/23 11/25/23  Albina GORMAN Dine, MD  cyanocobalamin  1000 MCG tablet Take 1,000 mcg by mouth daily. Patient not taking: Reported on 08/27/2023    [provider]  fluticasone  (FLONASE ) 50 MCG/ACT nasal spray Place 1 spray into both nostrils daily. 08/27/23 11/25/23  Albina GORMAN Dine, MD  ibandronate  (BONIVA ) 150 MG tablet Take 1 tablet (150 mg total) by mouth every 30 (thirty) days. Take in the morning with a full glass of water, on an empty stomach, and do not take anything else by mouth or lie down for the next 30 min. 02/13/23 02/13/24  Tejan-Sie, S Ahmed, MD  Iron, Ferrous Sulfate , 142 (45 Fe) MG TBCR Take 1 tablet by mouth daily. Patient not taking: Reported on 08/27/2023    [provider]  lisinopril  (ZESTRIL ) 20 MG tablet TAKE 1 TABLET BY MOUTH EVERY Ray 09/23/23   Albina GORMAN Dine, MD  LORazepam  (ATIVAN ) 1 MG tablet Take 1 tablet (1 mg total) by mouth 2  (two) times daily. 08/27/23   Albina GORMAN Dine, MD  LORazepam  (ATIVAN ) 1 MG tablet Take 1 tablet (1 mg total) by mouth 2 (two) times daily. 08/27/23 11/25/23  Albina GORMAN Dine, MD  magnesium  oxide (MAG-OX) 400 MG tablet TAKE 2 TABLETS BY MOUTH TWICE A Ray 05/27/23   Tejan-Sie, S Ahmed, MD  Magnesium  Oxide 400 (240 Mg) MG TABS Take 1 tablet (400 mg total) by mouth every morning. 05/16/17   Josette Ade, MD  metoprolol  succinate (TOPROL -XL) 50 MG 24 hr tablet TAKE 1 TABLET BY MOUTH EVERY Ray 09/19/23   Fernand Denyse LABOR, MD  ondansetron  (ZOFRAN ) 4 MG tablet TAKE 1 TABLET BY MOUTH TWICE A Ray 09/02/23   Albina GORMAN Dine, MD  ondansetron  (ZOFRAN -ODT) 4 MG disintegrating tablet Take 1 tablet (4 mg total) by mouth every 8 (eight) hours as needed. 09/02/23   Albina GORMAN Dine, MD  oxyCODONE  (ROXICODONE ) 5 MG immediate release tablet Take 1 tablet (5 mg total) by mouth every 4 (four) hours as needed for severe pain (pain score 7-10). 07/17/23   Robinette Vermell PARAS, MD  pioglitazone  (ACTOS ) 45 MG tablet Take 45 mg by mouth daily. Patient not taking: Reported on 08/27/2023    [provider]  rosuvastatin  (CRESTOR ) 20 MG tablet TAKE 1 TABLET BY MOUTH EVERY Ray IN THE EVENING 09/19/23   Fernand Denyse LABOR, MD  tirzepatide  (MOUNJARO ) 2.5 MG/0.5ML Pen INJECT 2.5 MG SUBCUTANEOUSLY WEEKLY Patient not taking: Reported on 08/27/2023 05/24/23   Tejan-Sie, S Ahmed, MD  tirzepatide  (MOUNJARO ) 5 MG/0.5ML Pen Inject 5 mg into the skin once a week. 08/27/23 11/19/23  Albina GORMAN Dine, MD  warfarin (COUMADIN ) 1 MG tablet Take 1 tablet daily or as directed Patient not taking: Reported on 08/27/2023 08/06/22   Fernand Denyse LABOR, MD  warfarin (COUMADIN ) 2 MG tablet TAKE 1 TABLET BY MOUTH DAILY OR AS DIRECTED 09/03/23   Fernand Denyse LABOR, MD  warfarin (COUMADIN ) 2 MG tablet TAKE 1 TABLET BY MOUTH DAILY  OR AS DIRECTED 09/03/23   Fernand Alter A, MD  warfarin (COUMADIN ) 3 MG tablet Take 3 mg by mouth as directed. Takes 3 mg all days except Mondays  and Thursdays Patient not taking: Reported on 08/27/2023    [provider]     Critical care time: 45 minutes

## 2023-11-15 NOTE — H&P (Signed)
 History and Physical    Russell Ray FMW:[id number] DOB: 05/05/1970 DOA: 11/15/2023  PCP: Albina GORMAN Dine, MD (Confirm with patient/family/NH records and if not entered, this has to be entered at [facility] point of entry) Patient coming from: Home  I have personally briefly reviewed patient's old medical records in [facility]  Chief Complaint: Feeling shaky, dysuria  HPI: Russell Ray is a 53 y.o. male with medical history significant of bicuspid aortic valve status post mitral aortic valve replacement, PAF on Coumadin , HTN, HLD, morbid obesity status post gastric bypass, IIDM, presented with generalized weakness urinary frequency.  Symptoms started yesterday, patient started to feel episode of chills and noticed urine color turned orange.  He tried to drink increased amount of water and still some slight improvement of urine color however continued to have chills.  Denied any dysuria no back pain no fever, denied any nauseous vomiting.  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  Earlier this month, patient also had a UTI process, with more severe dysuria and urine discoloration and foul smell, for which he received 7 days course of p.o. antibiotics.  Denies any history of BPH, no increase in nocturnal urination or broken urine stream. ED Course: Afebrile, blood pressure 72/42, responded to IV bolus improved to 115/58, UA showed 3+ WBC and 2+ RBC blood work showed sodium 136 BUN 37 creatinine 1.9 compared to baseline 1.0 WBC 10.4 hemoglobin 13.4 procalcitonin 2.1  Patient was given IV bolus and ceftriaxone  in the ED.  Review of Systems: As per HPI otherwise 14 point review of  systems negative.   Past Medical History:  Diagnosis Date   Anxiety    Aortic valve stenosis    Atrial fibrillation (HCC)    CHF (congestive heart failure) (HCC)    Depression    Diabetes mellitus without complication (HCC)    Hypertension     Past Surgical History:  Procedure Laterality Date   APPLICATION OF WOUND VAC     CARDIAC CATHETERIZATION Right 04/22/2015   Procedure: Right/Left Heart Cath and Coronary Angiography;  Surgeon: Denyse DELENA Bathe, MD;  Location: [facility] INVASIVE CV LAB;  Service: Cardiovascular;  Laterality: Right;   CARDIAC VALVE REPLACEMENT     CARDIOVERSION N/A 05/14/2017   Procedure: CARDIOVERSION;  Surgeon: Bathe Denyse DELENA, MD;  Location: [facility] ORS;  Service: Cardiovascular;  Laterality: N/A;   COLONOSCOPY N/A 10/21/2020   Procedure: COLONOSCOPY;  Surgeon: Toledo, Ladell POUR, MD;  Location: [facility] ENDOSCOPY;  Service: Gastroenterology;  Laterality: N/A;   COLONOSCOPY WITH PROPOFOL  N/A 10/05/2016   Procedure: COLONOSCOPY WITH PROPOFOL ;  Surgeon: Viktoria Lamar DASEN, MD;  Location: [facility] ENDOSCOPY;  Service: Endoscopy;  Laterality: N/A;   debridement sternum     ELECTROPHYSIOLOGIC STUDY N/A 05/05/2015   Procedure: Cardioversion;  Surgeon: Denyse DELENA Bathe, MD;  Location: [facility] ORS;  Service: Cardiovascular;  Laterality: N/A;   ELECTROPHYSIOLOGIC STUDY N/A 05/17/2015   Procedure: CARDIOVERSION;  Surgeon: Denyse DELENA Bathe, MD;  Location: [facility] ORS;  Service: Cardiovascular;  Laterality: N/A;   ESOPHAGOGASTRODUODENOSCOPY (EGD) WITH PROPOFOL  N/A 10/05/2016   Procedure: ESOPHAGOGASTRODUODENOSCOPY (EGD) WITH PROPOFOL ;  Surgeon: Viktoria Lamar DASEN, MD;  Location: [facility] ENDOSCOPY;  Service: Endoscopy;  Laterality: N/A;   GREATER OMENTAL FLAP CLOSURE     HERNIA REPAIR     X3   VALVE REPLACEMENT     VENTRAL HERNIA REPAIR N/A 12/01/2016   Procedure: HERNIA REPAIR VENTRAL ADULT with mesh;  Surgeon: Jordis Laneta FALCON, MD;  Location: [facility] ORS;  Service: General;  Laterality: N/A;     reports that he  has quit smoking. His smoking use included cigarettes. He has a 56 pack-year smoking history. He has never used smokeless tobacco. He reports that he does not drink alcohol and does not use drugs.  Allergies  Allergen Reactions   Lovenox  [Enoxaparin  Sodium] Itching    Family History  Problem Relation Age of Onset   Hypertension Mother    Hypertension Maternal Grandmother    Diabetes Maternal Grandmother      Prior to Admission medications   Medication Sig Start Date End Date Taking? Authorizing Provider  acetaminophen  (TYLENOL ) 325 MG tablet Take 650 mg by mouth every 4 (four) hours as needed for moderate pain.    [provider]  amiodarone  (PACERONE ) 200 MG tablet TAKE 1 TABLET BY MOUTH TWICE A DAY 08/29/23   Fernand Denyse LABOR, MD  budesonide -formoterol  (SYMBICORT ) 80-4.5 MCG/ACT inhaler Inhale 2 puffs into the lungs 2 (two) times daily. 08/27/23 12/09/23  Albina GORMAN Dine, MD  cephALEXin  (KEFLEX ) 500 MG capsule Take 1 capsule (500 mg total) by mouth 3 (three) times daily. Patient not taking: Reported on 08/27/2023 07/17/23   Robinette Vermell PARAS, MD  cetirizine  (ZYRTEC  ALLERGY) 10 MG tablet Take 1 tablet (10 mg total) by mouth daily. 08/27/23 11/25/23  Albina GORMAN Dine, MD  cyanocobalamin  1000 MCG tablet Take 1,000 mcg by mouth daily. Patient not taking: Reported on 08/27/2023    [provider]  fluticasone  (FLONASE ) 50 MCG/ACT nasal spray Place 1 spray into both nostrils daily. 08/27/23 11/25/23  Albina GORMAN Dine, MD  ibandronate  (BONIVA ) 150 MG tablet Take 1 tablet (150 mg total) by mouth every 30 (thirty) days. Take in the morning with a full glass of water, on an empty stomach, and do not take anything else by mouth or lie down for the next 30 min. 02/13/23 02/13/24  Tejan-Sie, S Ahmed, MD  Iron, Ferrous Sulfate , 142 (45 Fe) MG TBCR Take 1 tablet by mouth daily. Patient not taking: Reported on 08/27/2023    [provider]  lisinopril  (ZESTRIL ) 20 MG tablet TAKE 1 TABLET BY  MOUTH EVERY DAY 09/23/23   Albina GORMAN Dine, MD  LORazepam  (ATIVAN ) 1 MG tablet Take 1 tablet (1 mg total) by mouth 2 (two) times daily. 08/27/23   Tejan-Sie, S Ahmed, MD  LORazepam  (ATIVAN ) 1 MG tablet Take 1 tablet (1 mg total) by mouth 2 (two) times daily. 08/27/23 11/25/23  Albina GORMAN Dine, MD  magnesium  oxide (MAG-OX) 400 MG tablet TAKE 2 TABLETS BY MOUTH TWICE A DAY 05/27/23   Albina GORMAN Dine, MD  Magnesium  Oxide 400 (240 Mg) MG TABS Take 1 tablet (400 mg total) by mouth every morning. 05/16/17   Josette Ade, MD  metoprolol  succinate (TOPROL -XL) 50 MG 24 hr tablet TAKE 1 TABLET BY MOUTH EVERY DAY 09/19/23   Fernand Denyse LABOR, MD  ondansetron  (ZOFRAN ) 4 MG tablet TAKE 1 TABLET BY MOUTH TWICE A DAY 09/02/23   Albina GORMAN Dine, MD  ondansetron  (ZOFRAN -ODT) 4 MG disintegrating tablet Take 1 tablet (4 mg total) by mouth every 8 (eight) hours as needed. 09/02/23   Tejan-Sie, S Ahmed, MD  oxyCODONE  (ROXICODONE ) 5  MG immediate release tablet Take 1 tablet (5 mg total) by mouth every 4 (four) hours as needed for severe pain (pain score 7-10). 07/17/23   Robinette Vermell PARAS, MD  pioglitazone  (ACTOS ) 45 MG tablet Take 45 mg by mouth daily. Patient not taking: Reported on 08/27/2023    [provider]  rosuvastatin  (CRESTOR ) 20 MG tablet TAKE 1 TABLET BY MOUTH EVERY DAY IN THE EVENING 09/19/23   Fernand Denyse LABOR, MD  tirzepatide  (MOUNJARO ) 2.5 MG/0.5ML Pen INJECT 2.5 MG SUBCUTANEOUSLY WEEKLY Patient not taking: Reported on 08/27/2023 05/24/23   Tejan-Sie, S Ahmed, MD  tirzepatide  (MOUNJARO ) 5 MG/0.5ML Pen Inject 5 mg into the skin once a week. 08/27/23 11/19/23  Albina GORMAN Dine, MD  warfarin (COUMADIN ) 1 MG tablet Take 1 tablet daily or as directed Patient not taking: Reported on 08/27/2023 08/06/22   Fernand Denyse LABOR, MD  warfarin (COUMADIN ) 2 MG tablet TAKE 1 TABLET BY MOUTH DAILY OR AS DIRECTED 09/03/23   Fernand Denyse LABOR, MD  warfarin (COUMADIN ) 2 MG tablet TAKE 1 TABLET BY MOUTH DAILY OR AS DIRECTED 09/03/23    Fernand Denyse A, MD  warfarin (COUMADIN ) 3 MG tablet Take 3 mg by mouth as directed. Takes 3 mg all days except Mondays and Thursdays Patient not taking: Reported on 08/27/2023    [provider]    Physical Exam: Vitals:   11/15/23 1030 11/15/23 1045 11/15/23 1100 11/15/23 1130  BP: (!) 88/52  (!) 76/49 (!) 115/58  Pulse:      Resp: 17 16 20 15   Temp:      SpO2:      Weight:      Height:        Constitutional: NAD, calm, comfortable Vitals:   11/15/23 1030 11/15/23 1045 11/15/23 1100 11/15/23 1130  BP: (!) 88/52  (!) 76/49 (!) 115/58  Pulse:      Resp: 17 16 20 15   Temp:      SpO2:      Weight:      Height:       Eyes: PERRL, lids and conjunctivae normal ENMT: Mucous membranes are dry. Posterior pharynx clear of any exudate or lesions.Normal dentition.  Neck: normal, supple, no masses, no thyromegaly Respiratory: clear to auscultation bilaterally, no wheezing, no crackles. Normal respiratory effort. No accessory muscle use.  Cardiovascular: Regular rate and rhythm, no murmurs / rubs / gallops. No extremity edema. 2+ pedal pulses. No carotid bruits.  Abdomen: no tenderness, no masses palpated. No hepatosplenomegaly. Bowel sounds positive.  Musculoskeletal: no clubbing / cyanosis. No joint deformity upper and lower extremities. Good ROM, no contractures. Normal muscle tone.  Skin: no rashes, lesions, ulcers. No induration Neurologic: CN 2-12 grossly intact. Sensation intact, DTR normal. Strength 5/5 in all 4.  Psychiatric: Normal judgment and insight. Alert and oriented x 3. Normal mood.     Labs on Admission: I have personally reviewed following labs and imaging studies  CBC: Recent Labs  Lab 11/15/23 0904  WBC 10.4  NEUTROABS 9.2*  HGB 13.3  HCT 41.5  MCV 88.7  PLT 143*   Basic Metabolic Panel: Recent Labs  Lab 11/15/23 0904  NA 136  K 4.8  CL 102  CO2 21*  GLUCOSE 116*  BUN 37*  CREATININE 1.97*  CALCIUM  9.4  MG 1.5*   GFR: Estimated  Creatinine Clearance: 69.1 mL/min (A) (by C-G formula based on SCr of 1.97 mg/dL (H)). Liver Function Tests: Recent Labs  Lab 11/15/23 0904  AST 35  ALT 24  ALKPHOS 59  BILITOT 2.1*  PROT 7.0  ALBUMIN 3.4*   No results for input(s): LIPASE, AMYLASE in the last 168 hours. No results for input(s): AMMONIA in the last 168 hours. Coagulation Profile: Recent Labs  Lab 11/15/23 0904  INR 3.4*   Cardiac Enzymes: Recent Labs  Lab 11/15/23 0904  CKTOTAL 54   BNP (last 3 results) No results for input(s): PROBNP in the last 8760 hours. HbA1C: No results for input(s): HGBA1C in the last 72 hours. CBG: No results for input(s): GLUCAP in the last 168 hours. Lipid Profile: No results for input(s): CHOL, HDL, LDLCALC, TRIG, CHOLHDL, LDLDIRECT in the last 72 hours. Thyroid  Function Tests: No results for input(s): TSH, T4TOTAL, FREET4, T3FREE, THYROIDAB in the last 72 hours. Anemia Panel: No results for input(s): VITAMINB12, FOLATE, FERRITIN, TIBC, IRON, RETICCTPCT in the last 72 hours. Urine analysis:    Component Value Date/Time   COLORURINE AMBER (A) 11/15/2023 1043   APPEARANCEUR CLOUDY (A) 11/15/2023 1043   LABSPEC 1.029 11/15/2023 1043   PHURINE 5.0 11/15/2023 1043   GLUCOSEU NEGATIVE 11/15/2023 1043   HGBUR MODERATE (A) 11/15/2023 1043   BILIRUBINUR NEGATIVE 11/15/2023 1043   KETONESUR NEGATIVE 11/15/2023 1043   PROTEINUR 30 (A) 11/15/2023 1043   NITRITE NEGATIVE 11/15/2023 1043   LEUKOCYTESUR MODERATE (A) 11/15/2023 1043    Radiological Exams on Admission: DG Chest Port 1 View Result Date: 11/15/2023 EXAM: 1 VIEW XRAY OF THE CHEST 11/15/2023 09:04:00 AM COMPARISON: Chest radiograph 10/19/2020. CLINICAL HISTORY: Shortness of breath. Reports shob started yesterday, dark urine, chills/shaking, nausea and overall not feeling well. FINDINGS: LUNGS AND PLEURA: Low lung volumes. No focal pulmonary opacity. No pulmonary edema. No  pleural effusion. No pneumothorax. HEART AND MEDIASTINUM: No acute abnormality of the cardiac and mediastinal silhouettes. BONES AND SOFT TISSUES: No acute osseous abnormality. IMPRESSION: 1. No acute cardiopulmonary process. 2. Low lung volumes. Electronically signed by: Donnice Mania MD 11/15/2023 09:27 AM EDT RP Workstation: [id number]    EKG: Independently reviewed.  A-fib rate controlled, chronic RBBB, no acute ST changes.  Assessment/Plan Principal Problem:   Sepsis (HCC) Active Problems:   Sepsis secondary to UTI (HCC)   AKI (acute kidney injury) (HCC)  (please populate well all problems here in Problem List. (For example, if patient is on BP meds at home and you resume or decide to hold them, it is a problem that needs to be her. Same for CAD, COPD, HLD and so on)  Severe sepsis with acute endorgan damage - Sepsis evidenced by low BP, requiring IV bolus, leukocytosis, with signs of acute endorgan damage of AKI, source of infection is a UTI. - Blood pressure responded to initial IV bolus in the ED, patient still appears to be volume contracted, plan to continue maintenance IV fluid - Continue ceftriaxone , urine cultures pending Check PVR- -other possible contributing factor including trapped penis, with compromised urethra exit hygiene  AKI - Likely prerenal secondary to sepsis - CT abdomen pelvis pending to rule out any obstructions - Continue IV fluid and recheck kidney function tomorrow  HTN - Have to hold off home BP meds  Chronic A-fib - Change metoprolol  to as needed Lopressor  for rate control if needed - On Coumadin   History of bicuspid aortic valve status post mitral AV replacement - Pharmacy to redose Coumadin   Morbid obesity status post gastric bypass - BMI 60 - Continue Mounjaro  therapy  DVT prophylaxis: Coumadin  Code Status: Full code Family Communication: None at bedside Disposition Plan: Patient  sick with severe sepsis requiring IV bolus and IV  antibiotics, expect more than 2 midnight hospital stay Consults called: ICU Admission status: PCU admit   Cort ONEIDA Mana MD [facility] Pager [phone]  11/15/2023, 12:19 PM

## 2023-11-15 NOTE — Progress Notes (Signed)
 CODE SEPSIS - PHARMACY COMMUNICATION  **Broad Spectrum Antibiotics should be administered within 1 hour of Sepsis diagnosis**  Time Code Sepsis Called/Page Received: 1106  Antibiotics Ordered: Ceftriaxone   Time of 1st antibiotic administration: 1115  Additional action taken by pharmacy: N/A  Russell Ray 11/15/2023  11:38 AM

## 2023-11-15 NOTE — ED Provider Notes (Addendum)
 [facility] Provider Note    Event Date/Time   First MD Initiated Contact with Patient 11/15/23 [phone]     (approximate)   History   Shortness of Breath   HPI  Russell Ray is a 53 y.o. male past medical history significant for aortic valve replacement with mechanical valve on Coumadin , gastric sleeve, hernia repair, history of complicated urinary tract infection, atrial fibrillation, CHF, diabetes, hypertension, who presents to the emergency department for feeling well.  Patient states that he started feeling poorly yesterday.  Has been having frequent urination with small amounts of urine that have been very dark.  Denies any dysuria.  He was concerned that he might have a urinary tract infection which she has had in the past.  Yesterday while playing bingo had severe bilateral neck pain and back pain.  States that he has been feeling nauseous and needing to vomit.  Worsening back pain and states that yesterday he had a long course of shakes and chills and was unable to get warm for approximately 1 hour.  No history of kidney stones.  No falls or trauma.  Complaining of some shortness of breath.  No active chest pain at this time.  Endorses compliance with his Coumadin .     Physical Exam   Triage Vital Signs: ED Triage Vitals  Encounter Vitals Group     BP 11/15/23 0826 (!) 72/42     Girls Systolic BP Percentile --      Girls Diastolic BP Percentile --      Boys Systolic BP Percentile --      Boys Diastolic BP Percentile --      Pulse Rate 11/15/23 0826 77     Resp 11/15/23 0826 20     Temp 11/15/23 0826 98.2 F (36.8 C)     Temp src --      SpO2 11/15/23 0826 96 %     Weight 11/15/23 0824 (!) 388 lb (176 kg)     Height 11/15/23 0824 5' 7 (1.702 m)     Head Circumference --      Peak Flow --      Pain Score 11/15/23 0824 7     Pain Loc --      Pain Education --      Exclude from Growth Chart --     Most recent vital signs: Vitals:   11/15/23 1100  11/15/23 1130  BP: (!) 76/49 (!) 115/58  Pulse:    Resp: 20 15  Temp:    SpO2:      Physical Exam Constitutional:      Appearance: He is well-developed.  HENT:     Head: Atraumatic.  Eyes:     Extraocular Movements: Extraocular movements intact.     Conjunctiva/sclera: Conjunctivae normal.     Pupils: Pupils are equal, round, and reactive to light.  Neck:     Vascular: No JVD.  Cardiovascular:     Rate and Rhythm: Regular rhythm.  Pulmonary:     Effort: No respiratory distress.  Chest:     Chest wall: No tenderness.  Abdominal:     Palpations: Abdomen is soft.     Tenderness: There is no abdominal tenderness.  Musculoskeletal:        General: Normal range of motion.     Cervical back: Normal range of motion.     Right lower leg: No edema.     Left lower leg: No edema.     Comments: No  unilateral leg swelling  Skin:    General: Skin is warm.     Capillary Refill: Capillary refill takes less than 2 seconds.  Neurological:     General: No focal deficit present.     Mental Status: He is alert. Mental status is at baseline.     IMPRESSION / MDM / ASSESSMENT AND PLAN / ED COURSE  I reviewed the triage vital signs and the nursing notes.  On arrival patient was afebrile, not tachycardic but had a significantly low blood pressure of 72/40 that was repeated.  Is alert and oriented and otherwise well-appearing.  Broad differential diagnosis including sepsis from urinary tract infection, electrolyte abnormality, dehydration, ACS, cardiogenic shock, dissection, anemia, GI bleed  Blood cultures obtained.  Felt that 30 cc/kg of IV fluids may be detrimental given his history of heart failure will give 1 L bolus and reevaluate.  Delay in antibiotics and blood cultures given difficult IV stick and requiring ultrasound-guided IV  EKG  I, Clotilda Punter, the attending physician, personally viewed and interpreted this ECG.  EKG showed atrial fibrillation with a heart rate of 78.   QRS 162.  QTc 454.  Negative Sgarbossa's criteria.  No significant change when compared to prior EKG.  No signs of acute ischemia or dysrhythmia.  No tachycardic or bradycardic dysrhythmias while on cardiac telemetry.  RADIOLOGY I independently reviewed imaging, my interpretation of imaging: Chest x-ray with no signs of pneumonia  LABS (all labs ordered are listed, but only abnormal results are displayed) Labs interpreted as -    Labs Reviewed  COMPREHENSIVE METABOLIC PANEL WITH GFR - Abnormal; Notable for the following components:      Result Value   CO2 21 (*)    Glucose, Bld 116 (*)    BUN 37 (*)    Creatinine, Ser 1.97 (*)    Albumin 3.4 (*)    Total Bilirubin 2.1 (*)    GFR, Estimated 40 (*)    All other components within normal limits  MAGNESIUM  - Abnormal; Notable for the following components:   Magnesium  1.5 (*)    All other components within normal limits  BRAIN NATRIURETIC PEPTIDE - Abnormal; Notable for the following components:   B Natriuretic Peptide 161.7 (*)    All other components within normal limits  URINALYSIS, W/ REFLEX TO CULTURE (INFECTION SUSPECTED) - Abnormal; Notable for the following components:   Color, Urine AMBER (*)    APPearance CLOUDY (*)    Hgb urine dipstick MODERATE (*)    Protein, ur 30 (*)    Leukocytes,Ua MODERATE (*)    Bacteria, UA RARE (*)    All other components within normal limits  PROTIME-INR - Abnormal; Notable for the following components:   Prothrombin  Time 36.2 (*)    INR 3.4 (*)    All other components within normal limits  CBC WITH DIFFERENTIAL/PLATELET - Abnormal; Notable for the following components:   RDW 16.0 (*)    Platelets 143 (*)    Neutro Abs 9.2 (*)    Lymphs Abs 0.5 (*)    Abs Immature Granulocytes 0.15 (*)    All other components within normal limits  CULTURE, BLOOD (ROUTINE X 2)  CULTURE, BLOOD (ROUTINE X 2)  URINE CULTURE  LACTIC ACID, PLASMA  LACTIC ACID, PLASMA  CK  CBC WITH DIFFERENTIAL/PLATELET   PROCALCITONIN  CORTISOL  TROPONIN I (HIGH SENSITIVITY)  TROPONIN I (HIGH SENSITIVITY)     MDM  Low suspicion for ACS, no chest pain or shortness of  breath at this time.  Chest x-ray with no signs of pulmonary edema.  Bedside echocardiogram with no pericardial effusion.  Difficult to assess his EF, poor quality study secondary to his habitus.  Abdomen without significant tenderness to palpation and no signs of peritonitis.  Good intact pulses, have a low suspicion for dissection.  Attempted to get an ultrasound of his aorta however unable to secondary to body habitus.  No tearing pain, no unilateral pain, have a low suspicion for a dissection.  Low suspicion for AAA.  Does have an acute kidney injury.  Discussed with ICU team possibility of contrasted study.  Will hold off until they came and evaluate him.  Clinical Course as of 11/15/23 1146  Fri Nov 15, 2023  9076 Patient refuses pulse ox states that he has not been [SM]  1105 Reach out to the ICU team who will come and evaluate the patient for admission.  Urine resulted and does appear to have a urinary tract infection.  Will start on IV Rocephin , on chart review I do not see any history of a resistant urinary tract infection.  Urine will be sent for culture.  Normal lactic acid and no significant leukocytosis or anemia.  Mildly low magnesium  and was given IV replacement.  After 1 L of IV fluids continued to be hypotensive, will give another 500 bolus and then was given a second 500 bolus to have a total of 2 L.  Did have some improvement of his blood pressure but continues to be hypotensive.  Reached out to ICU team to discuss admission for hypotension likely secondary to urinary tract infection.  No chest pain at this time and troponin is negative. [SM]  1145 Blood pressure now improved with a blood pressure of 115/58 and a MAP of 71.  Patient was evaluated in the emergency department with the ICU team.  He was fluid responsive.  Will obtain a  noncontrasted CT scan of his abdomen and pelvis to further evaluate for kidney stones, or intra-abdominal abscess.  [SM]    Clinical Course User Index [SM] Suzanne Kirsch, MD     PROCEDURES:  Critical Care performed: yes  .Critical Care  Performed by: Suzanne Kirsch, MD Authorized by: Suzanne Kirsch, MD   Critical care provider statement:    Critical care time (minutes):  50   Critical care time was exclusive of:  Separately billable procedures and treating other patients   Critical care was time spent personally by me on the following activities:  Development of treatment plan with patient or surrogate, discussions with consultants, evaluation of patient's response to treatment, examination of patient, ordering and review of laboratory studies, ordering and review of radiographic studies, ordering and performing treatments and interventions, pulse oximetry, re-evaluation of patient's condition and review of old charts   Care discussed with: admitting provider   .Ultrasound ED Peripheral IV (Provider)  Date/Time: 11/15/2023 9:10 AM  Performed by: Suzanne Kirsch, MD Authorized by: Suzanne Kirsch, MD   Procedure details:    Indications: multiple failed IV attempts     Skin Prep: chlorhexidine gluconate     Location:  Right AC   Angiocath:  18 G   Bedside Ultrasound Guided: Yes     Images: not archived     Patient tolerated procedure without complications: Yes     Dressing applied: Yes     Patient's presentation is most consistent with acute presentation with potential threat to life or bodily function.   MEDICATIONS ORDERED IN ED: Medications  magnesium  sulfate IVPB 1 g 100 mL (has no administration in time range)  lactated ringers  infusion (has no administration in time range)  cefTRIAXone  (ROCEPHIN ) 2 g in sodium chloride  0.9 % 100 mL IVPB (has no administration in time range)  sodium chloride  0.9 % bolus 1,000 mL (1,000 mLs Intravenous New Bag/Given 11/15/23 0901)  sodium  chloride 0.9 % bolus 500 mL (500 mLs Intravenous New Bag/Given 11/15/23 1045)  cefTRIAXone  (ROCEPHIN ) 2 g in sodium chloride  0.9 % 100 mL IVPB (2 g Intravenous New Bag/Given 11/15/23 1115)  lactated ringers  bolus 500 mL (500 mLs Intravenous New Bag/Given 11/15/23 1118)    FINAL CLINICAL IMPRESSION(S) / ED DIAGNOSES   Final diagnoses:  SOB (shortness of breath)  Acute cystitis with hematuria  Hypotension, unspecified hypotension type     Rx / DC Orders   ED Discharge Orders     None        Note:  This document was prepared using Dragon voice recognition software and may include unintentional dictation errors.   Suzanne Kirsch, MD 11/15/23 1109    Suzanne Kirsch, MD 11/15/23 1115    Suzanne Kirsch, MD 11/15/23 1146

## 2023-11-15 NOTE — ED Notes (Signed)
 Pt has complaints of SOB and right sided neck pain. Pt is hypotensive on presentation 82/63

## 2023-11-15 NOTE — ED Notes (Signed)
 XR to bedside

## 2023-11-15 NOTE — Sepsis Progress Note (Signed)
 eLink is following this Code Sepsis.

## 2023-11-15 NOTE — ED Triage Notes (Signed)
 Pt to ED from [facility] for multiple complaints. Reports shob started yesterday, dark urine, chills/shaking, nausea and overall not feeling well.

## 2023-11-15 NOTE — ED Notes (Signed)
 Patient refuses to wear pulse oxygen monitor. MD Mumma made aware.

## 2023-11-15 NOTE — ED Notes (Signed)
 Patient to restroom and back to bed without incident. Family at the bedside. Monitor applied. Will continue to monitor.

## 2023-11-16 ENCOUNTER — Inpatient Hospital Stay (HOSPITAL_COMMUNITY)
Admit: 2023-11-16 | Discharge: 2023-11-16 | Disposition: A | Discharge status: Home / Self Care | Attending: Pulmonary Disease | Admitting: Pulmonary Disease

## 2023-11-16 DIAGNOSIS — A419 Sepsis, unspecified organism: Principal | ICD-10-CM

## 2023-11-16 DIAGNOSIS — R6520 Severe sepsis without septic shock: Secondary | ICD-10-CM | POA: Diagnosis not present

## 2023-11-16 DIAGNOSIS — N179 Acute kidney failure, unspecified: Secondary | ICD-10-CM

## 2023-11-16 DIAGNOSIS — R578 Other shock: Secondary | ICD-10-CM | POA: Diagnosis not present

## 2023-11-16 LAB — BASIC METABOLIC PANEL WITH GFR
Anion gap: 11 (ref 5–15)
BUN: 31 mg/dL — ABNORMAL HIGH (ref 6–20)
CO2: 20 mmol/L — ABNORMAL LOW (ref 22–32)
Calcium: 9 mg/dL (ref 8.9–10.3)
Chloride: 101 mmol/L (ref 98–111)
Creatinine, Ser: 1.4 mg/dL — ABNORMAL HIGH (ref 0.61–1.24)
GFR, Estimated: >60 mL/min (ref >60)
Glucose, Bld: 99 mg/dL (ref 70–99)
Potassium: 4.6 mmol/L (ref 3.5–5.1)
Sodium: 132 mmol/L — ABNORMAL LOW (ref 135–145)

## 2023-11-16 LAB — CBC
HCT: 30.8 % — ABNORMAL LOW (ref 39.0–52.0)
Hemoglobin: 9.8 g/dL — ABNORMAL LOW (ref 13.0–17.0)
MCH: 28.9 pg (ref 26.0–34.0)
MCHC: 31.8 g/dL (ref 30.0–36.0)
MCV: 90.9 fL (ref 80.0–100.0)
Platelets: 100 K/uL — ABNORMAL LOW (ref 150–400)
RBC: 3.39 MIL/uL — ABNORMAL LOW (ref 4.22–5.81)
RDW: 16.1 % — ABNORMAL HIGH (ref 11.5–15.5)
WBC: 5.1 K/uL (ref 4.0–10.5)
nRBC: 0 % (ref 0.0–0.2)

## 2023-11-16 LAB — ECHOCARDIOGRAM COMPLETE
AV Mean grad: 8 mmHg
AV Peak grad: 15.5 mmHg
Ao pk vel: 1.97 m/s
Area-P 1/2: 5.23 cm2
Height: 67 in
S' Lateral: 3.6 cm
Weight: 6208 [oz_av]

## 2023-11-16 LAB — PROCALCITONIN: Procalcitonin: 1.83 ng/mL

## 2023-11-16 LAB — PROTIME-INR
INR: 3.3 — ABNORMAL HIGH (ref 0.8–1.2)
Prothrombin Time: 35.2 s — ABNORMAL HIGH (ref 11.4–15.2)

## 2023-11-16 LAB — CORTISOL: Cortisol, Plasma: 19.9 ug/dL

## 2023-11-16 LAB — HIV ANTIBODY (ROUTINE TESTING W REFLEX): HIV Screen 4th Generation wRfx: NONREACTIVE

## 2023-11-16 LAB — MAGNESIUM: Magnesium: 1.6 mg/dL — ABNORMAL LOW (ref 1.7–2.4)

## 2023-11-16 MED ORDER — LACTATED RINGERS IV BOLUS
500.0000 mL | Freq: Once | INTRAVENOUS | Status: AC
  Administered 2023-11-16: 500 mL via INTRAVENOUS

## 2023-11-16 MED ORDER — METOPROLOL SUCCINATE ER 25 MG PO TB24: 25.0000 mg | ORAL_TABLET | Freq: Every day | ORAL | Status: DC

## 2023-11-16 MED ORDER — AMIODARONE HCL 200 MG PO TABS
200.0000 mg | ORAL_TABLET | Freq: Two times a day (BID) | ORAL | Status: DC
Start: 2023-11-16 — End: 2023-11-17
  Administered 2023-11-16 – 2023-11-17 (×3): 200 mg via ORAL
  Filled 2023-11-16 (×3): qty 1

## 2023-11-16 MED ORDER — METOPROLOL TARTRATE 25 MG PO TABS
12.5000 mg | ORAL_TABLET | Freq: Two times a day (BID) | ORAL | Status: DC
  Administered 2023-11-16 – 2023-11-17 (×3): 12.5 mg via ORAL
  Filled 2023-11-16 (×3): qty 1

## 2023-11-16 MED ORDER — MAGNESIUM SULFATE 2 GM/50ML IV SOLN
2.0000 g | Freq: Once | INTRAVENOUS | Status: AC
  Administered 2023-11-16: 2 g via INTRAVENOUS
  Filled 2023-11-16: qty 50

## 2023-11-16 MED ORDER — WARFARIN SODIUM 2 MG PO TABS
2.0000 mg | ORAL_TABLET | Freq: Once | ORAL | Status: DC
  Filled 2023-11-16: qty 1

## 2023-11-16 MED ORDER — LACTATED RINGERS IV SOLN: INTRAVENOUS | Status: DC

## 2023-11-16 MED ORDER — WARFARIN SODIUM 1 MG PO TABS
2.0000 mg | ORAL_TABLET | Freq: Once | ORAL | Status: AC
  Administered 2023-11-16: 2 mg via ORAL
  Filled 2023-11-16: qty 2

## 2023-11-16 NOTE — Plan of Care (Signed)

## 2023-11-16 NOTE — Progress Notes (Signed)
  Echocardiogram 2D Echocardiogram has been performed.  Russell Ray Louder 11/16/2023, 10:01 AM

## 2023-11-16 NOTE — Progress Notes (Signed)
 PROGRESS NOTE    Russell Ray  FMW:[id number] DOB: December 16, 1970 DOA: 11/15/2023 PCP: Albina GORMAN Dine, MD  Chief Complaint  Patient presents with   Shortness of Breath    Hospital Course:  Russell Ray is a 53 y.o. male with medical history significant of bicuspid aortic valve status post mitral aortic valve replacement, PAF on Coumadin , HTN, HLD, morbid obesity status post gastric bypass, IIDM, presented with generalized weakness, inc urinary frequency.  Patient admitted for sepsis due to UTI, AKI.  Hospital course as below   Subjective: Was examined at the bedside, new to me today.  States he feels much better and wanted to go home today.  BP soft, further workup pending. Discussed that he is on IV antibiotics, pending cultures and AKI improving.  Anticipate discharge tomorrow if BP stable   Objective: Vitals:   11/15/23 1948 11/15/23 2325 11/16/23 0456 11/16/23 0758  BP: (!) 103/55 (!) 131/57 (!) 98/59 111/67  Pulse: 72 (!) 107 97 84  Resp: 18 18 18 20   Temp: 99.3 F (37.4 C) 98.4 F (36.9 C) 99.3 F (37.4 C) 99.8 F (37.7 C)  TempSrc: Oral Oral Oral   SpO2: 100% 99% 99% (!) 76%  Weight:      Height:        Intake/Output Summary (Last 24 hours) at 11/16/2023 0854 Last data filed at 11/15/2023 2153 Gross per 24 hour  Intake 1632.9 ml  Output --  Net 1632.9 ml   Filed Weights   11/15/23 0824  Weight: (!) 176 kg    Examination:  Constitutional: NAD, calm, comfortable, obese Neck: normal, supple, no masses, no thyromegaly Respiratory: clear to auscultation bilaterally, no wheezing, no crackles Cardiovascular: Regular rate and rhythm, no murmurs / rubs / gallops Abdomen: no tenderness, no masses palpated. No hepatosplenomegaly. Bowel sounds positive.  Musculoskeletal: no clubbing / cyanosis. No joint deformity upper and lower extremities. Good ROM, no contractures. Normal muscle tone.  Skin: no rashes, lesions, ulcers. No induration Neurologic: AO x 3, no gross focal  deficits  Assessment & Plan:  Principal Problem:   Sepsis (HCC) Active Problems:   Sepsis secondary to UTI (HCC)   AKI (acute kidney injury) (HCC)   Complicated UTI (urinary tract infection)   Severe sepsis with acute endorgan damage UTI - Sepsis evidenced by low BP, requiring IV bolus, leukocytosis, with signs of acute endorgan damage of AKI, source of infection is a UTI. - Lactic acid not elevated, procalcitonin elevated - CT abdomen/pelvis shows no acute source of sepsis - S/p IV fluids - Continue IV ceftriaxone  - follow urine culture, blood culture - other possible contributing factor including trapped penis, with compromised urethra exit hygiene, will need urology follow-up outpatient  AKI - Likely prerenal secondary to sepsis - Cr 1.97 -> 1.40 - No evidence of obstruction on imaging - s/p IV fluids, encourage p.o. intake - Monitor Cr  Mild hyponatremia - s/p IV fluids - Monitor sodium  Hypomagnesemia - IV magnesium  - On p.o. mag supplements at home  ?? Remote history of CHF - Elevated BNP, s/p IV fluids on presentation due to dehydration - Echo shows EF 60 to 65%, no RWMA.  Diastolic parameters appear normal  Acute drop in hemoglobin -Suspect likely was dehydrated on presentation, Hb 13.3 -> 9.8 s/p IV fluids - No evidence of bleeding - Monitor Hb  Thrombocytopenia -Monitor platelet count   HTN - Hold lisinopril  20 mg until BP improves   Chronic A-fib History of bicuspid aortic valve status post mitral  AV replacement - Resume amiodarone , metoprolol  12.5 bid (home dose 50 XL) - On Coumadin , dose per pharmacy  Morbid obesity status post gastric bypass - BMI 60 - Continue Mounjaro  therapy  Incidental findings: - Multiple/Complex ventral abdominal hernias containing part of transverse colon, small bowel, adipose tissue.  No strangulation/obstruction - 2.8 x 2.4 cm right adrenal mass, stable  DVT prophylaxis: Warfarin   Code Status: Full  Code Disposition:  Home  Consultants:  None  Procedures:  None  Antimicrobials:  Anti-infectives (From admission, onward)    Start     Dose/Rate Route Frequency Ordered Stop   11/16/23 1000  cefTRIAXone  (ROCEPHIN ) 2 g in sodium chloride  0.9 % 100 mL IVPB  Status:  Discontinued        2 g 200 mL/hr over 30 Minutes Intravenous Every 24 hours 11/15/23 1123 11/15/23 1226   11/16/23 1000  cefTRIAXone  (ROCEPHIN ) 2 g in sodium chloride  0.9 % 100 mL IVPB        2 g 200 mL/hr over 30 Minutes Intravenous Every 24 hours 11/15/23 1218     11/15/23 1115  cefTRIAXone  (ROCEPHIN ) 2 g in sodium chloride  0.9 % 100 mL IVPB        2 g 200 mL/hr over 30 Minutes Intravenous Once 11/15/23 1105 11/15/23 1220       Data Reviewed: I have personally reviewed following labs and imaging studies CBC: Recent Labs  Lab 11/15/23 0904 11/16/23 0428  WBC 10.4 5.1  NEUTROABS 9.2*  --   HGB 13.3 9.8*  HCT 41.5 30.8*  MCV 88.7 90.9  PLT 143* 100*   Basic Metabolic Panel: Recent Labs  Lab 11/15/23 0904 11/16/23 0428  NA 136 132*  K 4.8 4.6  CL 102 101  CO2 21* 20*  GLUCOSE 116* 99  BUN 37* 31*  CREATININE 1.97* 1.40*  CALCIUM  9.4 9.0  MG 1.5*  --    GFR: Estimated Creatinine Clearance: 97.2 mL/min (A) (by C-G formula based on SCr of 1.4 mg/dL (H)). Liver Function Tests: Recent Labs  Lab 11/15/23 0904  AST 35  ALT 24  ALKPHOS 59  BILITOT 2.1*  PROT 7.0  ALBUMIN 3.4*   CBG: No results for input(s): GLUCAP in the last 168 hours.  Recent Results (from the past 240 hours)  Blood Culture (routine x 2)     Status: None (Preliminary result)   Collection Time: 11/15/23  9:04 AM   Specimen: BLOOD  Result Value Ref Range Status   Specimen Description BLOOD BLOOD LEFT HAND  Final   Special Requests   Final    BOTTLES DRAWN AEROBIC AND ANAEROBIC Blood Culture results may not be optimal due to an inadequate volume of blood received in culture bottles   Culture   Final    NO GROWTH < 24  HOURS Performed at [facility], [address] [place], [place] [address]    Report Status PENDING  Incomplete  Blood Culture (routine x 2)     Status: None (Preliminary result)   Collection Time: 11/15/23  9:16 AM   Specimen: BLOOD  Result Value Ref Range Status   Specimen Description BLOOD RIGHT ANTECUBITAL  Final   Special Requests   Final    BOTTLES DRAWN AEROBIC AND ANAEROBIC Blood Culture adequate volume   Culture   Final    NO GROWTH < 24 HOURS Performed at [facility], [address], [place], [place] [address]    Report Status PENDING  Incomplete  Radiology Studies: CT ABDOMEN PELVIS WO CONTRAST Result Date: 11/15/2023 CLINICAL DATA:  Sepsis and shortness of breath EXAM: CT ABDOMEN AND PELVIS WITHOUT CONTRAST TECHNIQUE: Multidetector CT imaging of the abdomen and pelvis was performed following the standard protocol without IV contrast. RADIATION DOSE REDUCTION: This exam was performed according to the departmental dose-optimization program which includes automated exposure control, adjustment of the mA and/or kV according to patient size and/or use of iterative reconstruction technique. COMPARISON:  07/17/2023 FINDINGS: Lower chest: Right coronary artery atherosclerosis. Mitral valve calcification. Aortic valve prosthesis. Mild right middle lobe scarring laterally. Hepatobiliary: Unremarkable Pancreas: Unremarkable Spleen: Unremarkable Adrenals/Urinary Tract: 2.8 by 2.4 cm right adrenal mass with internal density 34 Hounsfield units precontrast, nonspecific, but no change in size or appearance from 03/18/2015 indicating 8 years of stability implying benign etiology. Accordingly, further workup is considered optional and if elected, adrenal protocol MRI with and without contrast would be suggested. 1.1 cm benign Bosniak category 2 cyst of the right mid kidney on image 51 series 5 has an internal density of 97 Hounsfield units, compatible with benign hemorrhagic  cyst. No further imaging workup of this lesion is indicated. Stomach/Bowel: Prior sleeve gastrectomy noted.  Normal appendix. Complex bilobed upper abdominal hernia with the lobulations containing part of the transverse colon. Below this are 2 separate supraumbilical hernias containing margins of the transverse colon. Another small ventral midline hernia contains a small segment of small bowel on image 59 of series 5 and image 89 series 9. Finally, a lower abdominal midline hernia contains lobulations of adipose tissue as well as a loop of small bowel as on image 72 series 5 and image 77 series 9. None of these hernias appear to be associated with strangulation of bowel or obstruction. Vascular/Lymphatic: Atherosclerosis is present, including aortoiliac atherosclerotic disease. Reproductive: Unremarkable Other: Small foci of fat necrosis along the omentum. Musculoskeletal: Various primarily midline hernias as noted above. Anterior bridging spurring of the sacroiliac joints. Lower thoracic spondylosis with anterior bridging interbody spurring as on image 85 series 9. Subxiphoid hernia contains adipose tissue. IMPRESSION: 1. A specific cause for the patient's sepsis is not identified. 2. Multiple/complex ventral abdominal hernias containing part of the transverse colon, small bowel, and adipose tissue. There is also a sub xiphoid hernia containing adipose tissue. No strangulation or obstruction identified. 3. 2.8 by 2.4 cm right adrenal mass with internal density 34 Hounsfield units precontrast, nonspecific, but no change in size or appearance from 03/18/2015 indicating 8 years of stability implying benign etiology. Accordingly, further workup is considered optional and if elected, adrenal protocol MRI with and without contrast would be suggested. 4. Right coronary artery atherosclerosis. Mitral valve calcification. Aortic valve prosthesis. 5.  Aortic Atherosclerosis (ICD10-I70.0). Electronically Signed   By: Ryan Salvage M.D.   On: 11/15/2023 13:30   DG Chest Port 1 View Result Date: 11/15/2023 EXAM: 1 VIEW XRAY OF THE CHEST 11/15/2023 09:04:00 AM COMPARISON: Chest radiograph 10/19/2020. CLINICAL HISTORY: Shortness of breath. Reports shob started yesterday, dark urine, chills/shaking, nausea and overall not feeling well. FINDINGS: LUNGS AND PLEURA: Low lung volumes. No focal pulmonary opacity. No pulmonary edema. No pleural effusion. No pneumothorax. HEART AND MEDIASTINUM: No acute abnormality of the cardiac and mediastinal silhouettes. BONES AND SOFT TISSUES: No acute osseous abnormality. IMPRESSION: 1. No acute cardiopulmonary process. 2. Low lung volumes. Electronically signed by: Donnice Mania MD 11/15/2023 09:27 AM EDT RP Workstation: [id number]    Scheduled Meds:  fluticasone  furoate-vilanterol  1 puff Inhalation Daily   loratadine   10 mg Oral Daily   LORazepam   1 mg Oral BID   magnesium  oxide  800 mg Oral BID   oxyCODONE   5 mg Oral Weekly   rosuvastatin   20 mg Oral Daily   warfarin  2 mg Oral ONCE-1600   Warfarin - Pharmacist Dosing Inpatient   Does not apply q1600   Continuous Infusions:  cefTRIAXone  (ROCEPHIN )  IV       LOS: 1 day  MDM: Patient is high risk for one or more organ failure.  They necessitate ongoing hospitalization for continued IV therapies and subsequent lab monitoring. Total time spent interpreting labs and vitals, reviewing the medical record, coordinating care amongst consultants and care team members, directly assessing and discussing care with the patient and/or family: 55 min Laree Lock, MD [facility]  To contact the attending physician between 7A-7P please use Epic Chat. To contact the covering physician during after hours 7P-7A, please review Amion.  11/16/2023, 8:54 AM   *This document has been created with the assistance of dictation software. Please excuse typographical errors. *

## 2023-11-16 NOTE — Consult Note (Signed)
 PHARMACY - ANTICOAGULATION CONSULT NOTE  Pharmacy Consult for Warfarin Indication: atrial fibrillation and mAVR  Allergies  Allergen Reactions   Lovenox  [Enoxaparin  Sodium] Itching   Patient Measurements: Height: 5' 7 (170.2 cm) Weight: (!) 176 kg (388 lb) IBW/kg (Calculated) : 66.1 HEPARIN  DW (KG): 110.6  Labs: Recent Labs    11/15/23 0904 11/15/23 1057 11/16/23 0428  HGB 13.3  --  9.8*  HCT 41.5  --  30.8*  PLT 143*  --  100*  LABPROT 36.2*  --  35.2*  INR 3.4*  --  3.3*  CREATININE 1.97*  --  1.40*  CKTOTAL 54  --   --   TROPONINIHS 10 7  --     Estimated Creatinine Clearance: 97.2 mL/min (A) (by C-G formula based on SCr of 1.4 mg/dL (H)).   Medical History: Past Medical History:  Diagnosis Date   Anxiety    Aortic valve stenosis    Atrial fibrillation (HCC)    CHF (congestive heart failure) (HCC)    Depression    Diabetes mellitus without complication (HCC)    Hypertension     Medications:  Warfarin 2 mg every evening. Last patient reported dose was 11/14/23  Assessment: 53 y/o M with medical history including Afib and mAVR presenting with concerns for sepsis complicated by AKI. Pharmacy consulted to dose warfarin while admitted.  His Tbili is 2.1 which is elevated from 0.6 on 11/04/23 which may indicate some liver dysfunction. Albumin is 3.4 from 4.4 on 11/04/23. Baseline CBC with normal H&H, platelets mildly low  DDIs: Ceftriaxone . Home amiodarone  has not been continued at the time of this note  Date INR Plan  9/19 3.4 1 mg  9/20 3.3 2 mg    Goal of Therapy:  INR 2.5 - 3.5  Plan:  --Resume home dose of warfarin 2 mg tonight --Daily INR per protocol  Marolyn KATHEE Mare 11/16/2023,8:23 AM

## 2023-11-17 DIAGNOSIS — A419 Sepsis, unspecified organism: Secondary | ICD-10-CM | POA: Diagnosis not present

## 2023-11-17 DIAGNOSIS — R6520 Severe sepsis without septic shock: Secondary | ICD-10-CM | POA: Diagnosis not present

## 2023-11-17 DIAGNOSIS — N179 Acute kidney failure, unspecified: Secondary | ICD-10-CM | POA: Diagnosis not present

## 2023-11-17 LAB — MAGNESIUM: Magnesium: 1.9 mg/dL (ref 1.7–2.4)

## 2023-11-17 LAB — URINE CULTURE: Culture: >=100000 — AB

## 2023-11-17 LAB — BASIC METABOLIC PANEL WITH GFR
Anion gap: 8 (ref 5–15)
BUN: 27 mg/dL — ABNORMAL HIGH (ref 6–20)
CO2: 21 mmol/L — ABNORMAL LOW (ref 22–32)
Calcium: 9 mg/dL (ref 8.9–10.3)
Chloride: 101 mmol/L (ref 98–111)
Creatinine, Ser: 1.2 mg/dL (ref 0.61–1.24)
GFR, Estimated: >60 mL/min (ref >60)
Glucose, Bld: 108 mg/dL — ABNORMAL HIGH (ref 70–99)
Potassium: 3.7 mmol/L (ref 3.5–5.1)
Sodium: 130 mmol/L — ABNORMAL LOW (ref 135–145)

## 2023-11-17 LAB — CBC
HCT: 34.4 % — ABNORMAL LOW (ref 39.0–52.0)
Hemoglobin: 11.1 g/dL — ABNORMAL LOW (ref 13.0–17.0)
MCH: 28.7 pg (ref 26.0–34.0)
MCHC: 32.3 g/dL (ref 30.0–36.0)
MCV: 88.9 fL (ref 80.0–100.0)
Platelets: 115 K/uL — ABNORMAL LOW (ref 150–400)
RBC: 3.87 MIL/uL — ABNORMAL LOW (ref 4.22–5.81)
RDW: 16.3 % — ABNORMAL HIGH (ref 11.5–15.5)
WBC: 3.9 K/uL — ABNORMAL LOW (ref 4.0–10.5)
nRBC: 0 % (ref 0.0–0.2)

## 2023-11-17 LAB — PROTIME-INR
INR: 2.4 — ABNORMAL HIGH (ref 0.8–1.2)
Prothrombin Time: 27 s — ABNORMAL HIGH (ref 11.4–15.2)

## 2023-11-17 MED ORDER — OXYCODONE HCL 5 MG PO TABS: 5.0000 mg | ORAL_TABLET | Freq: Four times a day (QID) | ORAL | 0 refills | Status: DC | PRN

## 2023-11-17 MED ORDER — CEPHALEXIN 500 MG PO CAPS: 500.0000 mg | ORAL_CAPSULE | Freq: Four times a day (QID) | ORAL | 0 refills | Status: DC

## 2023-11-17 MED ORDER — CEPHALEXIN 500 MG PO CAPS: 500.0000 mg | ORAL_CAPSULE | Freq: Four times a day (QID) | ORAL | 0 refills | Status: AC

## 2023-11-17 MED ORDER — WARFARIN SODIUM 3 MG PO TABS
3.0000 mg | ORAL_TABLET | Freq: Once | ORAL | Status: AC
  Administered 2023-11-17: 3 mg via ORAL
  Filled 2023-11-17: qty 1

## 2023-11-17 NOTE — Discharge Summary (Signed)
 Physician Discharge Summary   Patient: Russell Ray MRN: [id number] DOB: 11-19-1970  Admit date:     11/15/2023  Discharge date: 11/17/23  Discharge Physician: Laree Lock   PCP: Albina GORMAN Dine, MD   Recommendations at discharge:   Follow up with PCP within 5 days - Repeat BMP, CBC, Mag Resume Lisinopril  as BP tolerates  Follow up with Urology as recommended  Discharge Diagnoses: Principal Problem:   Sepsis (HCC) Active Problems:   Sepsis secondary to UTI (HCC)   AKI (acute kidney injury) (HCC)   Complicated UTI (urinary tract infection)  Hospital Course: Russell Ray is a 53 y.o. male with medical history significant of bicuspid aortic valve status post mitral aortic valve replacement, PAF on Coumadin , HTN, HLD, morbid obesity status post gastric bypass, IIDM, presented with generalized weakness, inc urinary frequency.   Patient admitted for sepsis due to UTI, AKI.  Hospital course as below  Severe sepsis with acute endorgan damage UTI - Sepsis evidenced by low BP, requiring IV bolus, leukocytosis, with signs of acute endorgan damage of AKI, source of infection is a UTI - Lactic acid not elevated, procalcitonin elevated - CT abdomen/pelvis shows no acute source of sepsis - S/p IV fluids, BP improved - Urine culture growing E. coli, sensitive to cephalosporin - Blood culture NGTD - was on  IV ceftriaxone , discharged on cephalexin  to complete 10 days - other possible contributing factor including trapped penis, with compromised urethra exit hygiene, will need urology follow-up outpatient   AKI - resolved - Likely prerenal secondary to sepsis - Cr 1.97 -> 1.20 - No evidence of obstruction on imaging - s/p IV fluids, encourage p.o. intake   Mild hyponatremia - s/p IV fluids, possible SIADH due to pain - Patient insisted on going home today, advised to get repeat labs with PCP within 5 days - Oxycodone  5mg  (10 tabs) for back pain   Hypomagnesemia - On p.o. mag  supplements at home   ?? Remote history of CHF - Elevated BNP, s/p IV fluids on presentation due to dehydration - Echo shows EF 60 to 65%, no RWMA.  Diastolic parameters appear normal   Acute drop in hemoglobin -Suspect likely was dehydrated on presentation, Hb 13.3 -> 9.8 s/p IV fluids -> 11.1, stable - No evidence of bleeding   Thrombocytopenia -Monitor platelet count   HTN - Hold lisinopril  20 mg until BP improves, follow up outpatient   Chronic A-fib History of bicuspid aortic valve status post mitral AV replacement - Amiodarone , metoprolol  - On Coumadin  (discussed with pharmacy, continue same dose)   Morbid obesity status post gastric bypass - BMI 60 - Continue Mounjaro  therapy   Incidental findings: - Multiple/Complex ventral abdominal hernias containing part of transverse colon, small bowel, adipose tissue.  No strangulation/obstruction - 2.8 x 2.4 cm right adrenal mass, stable   Consultants: None Procedures performed: None  Disposition: Home Diet recommendation:  Discharge Diet Orders (From admission, onward)     Start     Ordered   11/17/23 0000  Diet - low sodium heart healthy        11/17/23 1236           DISCHARGE MEDICATION: Allergies as of 11/17/2023       Reactions   Lovenox  [enoxaparin  Sodium] Itching        Medication List     PAUSE taking these medications    lisinopril  20 MG tablet Wait to take this until your doctor or other care provider tells you to  start again. Commonly known as: ZESTRIL  TAKE 1 TABLET BY MOUTH EVERY DAY       STOP taking these medications    amoxicillin  500 MG capsule Commonly known as: AMOXIL    cefdinir 300 MG capsule Commonly known as: OMNICEF       TAKE these medications    acetaminophen  325 MG tablet Commonly known as: TYLENOL  Take 650 mg by mouth every 4 (four) hours as needed for moderate pain.   amiodarone  200 MG tablet Commonly known as: PACERONE  TAKE 1 TABLET BY MOUTH TWICE A DAY    budesonide -formoterol  80-4.5 MCG/ACT inhaler Commonly known as: SYMBICORT  Inhale 2 puffs into the lungs 2 (two) times daily.   celecoxib  400 MG capsule Commonly known as: CELEBREX  Take 400 mg by mouth daily.   cephALEXin  500 MG capsule Commonly known as: KEFLEX  Take 1 capsule (500 mg total) by mouth 4 (four) times daily for 7 days. Start taking on: November 18, 2023 What changed: when to take this   cetirizine  10 MG tablet Commonly known as: ZyrTEC  Allergy Take 1 tablet (10 mg total) by mouth daily.   cyanocobalamin  1000 MCG tablet Take 1,000 mcg by mouth daily.   fluticasone  50 MCG/ACT nasal spray Commonly known as: FLONASE  Place 1 spray into both nostrils daily.   ibandronate  150 MG tablet Commonly known as: Boniva  Take 1 tablet (150 mg total) by mouth every 30 (thirty) days. Take in the morning with a full glass of water, on an empty stomach, and do not take anything else by mouth or lie down for the next 30 min.   Iron (Ferrous Sulfate ) 142 (45 Fe) MG Tbcr Take 1 tablet by mouth daily.   LORazepam  1 MG tablet Commonly known as: ATIVAN  Take 1 tablet (1 mg total) by mouth 2 (two) times daily.   LORazepam  1 MG tablet Commonly known as: Ativan  Take 1 tablet (1 mg total) by mouth 2 (two) times daily.   magnesium  oxide 400 (240 Mg) MG tablet Commonly known as: MAG-OX Take 1 tablet (400 mg total) by mouth every morning.   magnesium  oxide 400 MG tablet Commonly known as: MAG-OX TAKE 2 TABLETS BY MOUTH TWICE A DAY   metoprolol  succinate 50 MG 24 hr tablet Commonly known as: TOPROL -XL TAKE 1 TABLET BY MOUTH EVERY DAY   Mounjaro  2.5 MG/0.5ML Pen Generic drug: tirzepatide  INJECT 2.5 MG SUBCUTANEOUSLY WEEKLY   Mounjaro  5 MG/0.5ML Pen Generic drug: tirzepatide  Inject 5 mg into the skin once a week.   ondansetron  4 MG disintegrating tablet Commonly known as: ZOFRAN -ODT Take 1 tablet (4 mg total) by mouth every 8 (eight) hours as needed.   ondansetron  4 MG  tablet Commonly known as: ZOFRAN  TAKE 1 TABLET BY MOUTH TWICE A DAY   oxyCODONE  5 MG immediate release tablet Commonly known as: Roxicodone  Take 1 tablet (5 mg total) by mouth every 6 (six) hours as needed for severe pain (pain score 7-10). What changed: when to take this   pioglitazone  45 MG tablet Commonly known as: ACTOS  Take 45 mg by mouth daily.   rosuvastatin  20 MG tablet Commonly known as: CRESTOR  TAKE 1 TABLET BY MOUTH EVERY DAY IN THE EVENING   warfarin 2 MG tablet Commonly known as: COUMADIN  TAKE 1 TABLET BY MOUTH DAILY OR AS DIRECTED What changed: Another medication with the same name was removed. Continue taking this medication, and follow the directions you see here.   warfarin 2 MG tablet Commonly known as: COUMADIN  TAKE 1 TABLET BY MOUTH DAILY  OR AS DIRECTED What changed: Another medication with the same name was removed. Continue taking this medication, and follow the directions you see here.        Discharge Exam: Filed Weights   11/15/23 0824  Weight: (!) 176 kg   Constitutional: NAD, calm, comfortable, obese Neck: normal, supple, no masses, no thyromegaly Respiratory: clear to auscultation bilaterally, no wheezing, no crackles Cardiovascular: Regular rate and rhythm, no murmurs / rubs / gallops Abdomen: no tenderness, no masses palpated. No hepatosplenomegaly. Bowel sounds positive.  Musculoskeletal: no clubbing / cyanosis. No joint deformity upper and lower extremities. Good ROM, no contractures. Normal muscle tone.  Skin: no rashes, lesions, ulcers. No induration Neurologic: AO x 3, no gross focal deficits  Condition at discharge: good  The results of significant diagnostics from this hospitalization (including imaging, microbiology, ancillary and laboratory) are listed below for reference.   Imaging Studies: ECHOCARDIOGRAM COMPLETE Result Date: 11/16/2023    ECHOCARDIOGRAM REPORT   Patient Name:   TYKE OUTMAN  Date of Exam: 11/16/2023 Medical Rec  #:  [id number]  Height:       67.0 in Accession #:    [id number] Weight:       388.0 lb Date of Birth:  26-Jul-1970 BSA:          2.680 m Patient Age:    52 years   BP:           98/59 mmHg Patient Gender: M          HR:           91 bpm. Exam Location:  [facility] Procedure: 2D Echo, Cardiac Doppler and Color Doppler (Both Spectral and Color            Flow Doppler were utilized during procedure). Indications:     Shock R57.9  History:         Patient has prior history of Echocardiogram examinations, most                  recent 11/12/2018.  Sonographer:     Thedora Louder RDCS, FASE Referring Phys:  [id number] INGE JONETTA LECHER Diagnosing Phys: Annalee Casa  Sonographer Comments: Technically challenging study due to limited acoustic windows, suboptimal parasternal window, suboptimal apical window, suboptimal subcostal window and patient is obese. Image acquisition challenging due to patient body habitus. Acoustically limited study due to large surgical sternal scar. The patient declined Definity , ultrasound imaging agent. IMPRESSIONS  1. Left ventricular ejection fraction, by estimation, is 60 to 65%. The left ventricle has normal function. The left ventricle has no regional wall motion abnormalities. Left ventricular diastolic parameters were normal.  2. Right ventricular systolic function is normal. The right ventricular size is normal.  3. The mitral valve is normal in structure. No evidence of mitral valve regurgitation. No evidence of mitral stenosis.  4. The aortic valve is normal in structure. Aortic valve regurgitation is not visualized. No aortic stenosis is present.  5. The inferior vena cava is normal in size with greater than 50% respiratory variability, suggesting right atrial pressure of 3 mmHg. FINDINGS  Left Ventricle: Left ventricular ejection fraction, by estimation, is 60 to 65%. The left ventricle has normal function. The left ventricle has no regional wall motion abnormalities. The left ventricular  internal cavity size was normal in size. There is  no left ventricular hypertrophy. Left ventricular diastolic parameters were normal. Right Ventricle: The right ventricular size is normal. No increase in right ventricular wall thickness. Right ventricular systolic  function is normal. Left Atrium: Left atrial size was normal in size. Right Atrium: Right atrial size was normal in size. Pericardium: There is no evidence of pericardial effusion. Mitral Valve: The mitral valve is normal in structure. No evidence of mitral valve regurgitation. No evidence of mitral valve stenosis. Tricuspid Valve: The tricuspid valve is normal in structure. Tricuspid valve regurgitation is trivial. Aortic Valve: The aortic valve is normal in structure. Aortic valve regurgitation is not visualized. No aortic stenosis is present. Aortic valve mean gradient measures 8.0 mmHg. Aortic valve peak gradient measures 15.5 mmHg. Pulmonic Valve: The pulmonic valve was normal in structure. Pulmonic valve regurgitation is not visualized. Aorta: The aortic root is normal in size and structure. Venous: The inferior vena cava is normal in size with greater than 50% respiratory variability, suggesting right atrial pressure of 3 mmHg. IAS/Shunts: No atrial level shunt detected by color flow Doppler.  LEFT VENTRICLE PLAX 2D LVIDd:         5.50 cm   Diastology LVIDs:         3.60 cm   LV e' lateral:   8.38 cm/s LV PW:         1.20 cm   LV E/e' lateral: 11.8 LV IVS:        1.00 cm LVOT diam:     2.00 cm LVOT Area:     3.14 cm  LEFT ATRIUM         Index LA diam:    5.70 cm 2.13 cm/m  AORTIC VALVE AV Vmax:      197.00 cm/s AV Vmean:     133.000 cm/s AV VTI:       0.308 m AV Peak Grad: 15.5 mmHg AV Mean Grad: 8.0 mmHg  AORTA Ao Root diam: 3.40 cm MITRAL VALVE MV Area (PHT): 5.23 cm    SHUNTS MV Decel Time: 145 msec    Systemic Diam: 2.00 cm MV E velocity: 98.50 cm/s Designer, multimedia signed by Annalee Casa Signature Date/Time:  11/16/2023/11:48:15 AM    Final    CT ABDOMEN PELVIS WO CONTRAST Result Date: 11/15/2023 CLINICAL DATA:  Sepsis and shortness of breath EXAM: CT ABDOMEN AND PELVIS WITHOUT CONTRAST TECHNIQUE: Multidetector CT imaging of the abdomen and pelvis was performed following the standard protocol without IV contrast. RADIATION DOSE REDUCTION: This exam was performed according to the departmental dose-optimization program which includes automated exposure control, adjustment of the mA and/or kV according to patient size and/or use of iterative reconstruction technique. COMPARISON:  07/17/2023 FINDINGS: Lower chest: Right coronary artery atherosclerosis. Mitral valve calcification. Aortic valve prosthesis. Mild right middle lobe scarring laterally. Hepatobiliary: Unremarkable Pancreas: Unremarkable Spleen: Unremarkable Adrenals/Urinary Tract: 2.8 by 2.4 cm right adrenal mass with internal density 34 Hounsfield units precontrast, nonspecific, but no change in size or appearance from 03/18/2015 indicating 8 years of stability implying benign etiology. Accordingly, further workup is considered optional and if elected, adrenal protocol MRI with and without contrast would be suggested. 1.1 cm benign Bosniak category 2 cyst of the right mid kidney on image 51 series 5 has an internal density of 97 Hounsfield units, compatible with benign hemorrhagic cyst. No further imaging workup of this lesion is indicated. Stomach/Bowel: Prior sleeve gastrectomy noted.  Normal appendix. Complex bilobed upper abdominal hernia with the lobulations containing part of the transverse colon. Below this are 2 separate supraumbilical hernias containing margins of the transverse colon. Another small ventral midline hernia contains a small segment of small bowel on image 59 of  series 5 and image 89 series 9. Finally, a lower abdominal midline hernia contains lobulations of adipose tissue as well as a loop of small bowel as on image 72 series 5 and image 77  series 9. None of these hernias appear to be associated with strangulation of bowel or obstruction. Vascular/Lymphatic: Atherosclerosis is present, including aortoiliac atherosclerotic disease. Reproductive: Unremarkable Other: Small foci of fat necrosis along the omentum. Musculoskeletal: Various primarily midline hernias as noted above. Anterior bridging spurring of the sacroiliac joints. Lower thoracic spondylosis with anterior bridging interbody spurring as on image 85 series 9. Subxiphoid hernia contains adipose tissue. IMPRESSION: 1. A specific cause for the patient's sepsis is not identified. 2. Multiple/complex ventral abdominal hernias containing part of the transverse colon, small bowel, and adipose tissue. There is also a sub xiphoid hernia containing adipose tissue. No strangulation or obstruction identified. 3. 2.8 by 2.4 cm right adrenal mass with internal density 34 Hounsfield units precontrast, nonspecific, but no change in size or appearance from 03/18/2015 indicating 8 years of stability implying benign etiology. Accordingly, further workup is considered optional and if elected, adrenal protocol MRI with and without contrast would be suggested. 4. Right coronary artery atherosclerosis. Mitral valve calcification. Aortic valve prosthesis. 5.  Aortic Atherosclerosis (ICD10-I70.0). Electronically Signed   By: Ryan Salvage M.D.   On: 11/15/2023 13:30   DG Chest Port 1 View Result Date: 11/15/2023 EXAM: 1 VIEW XRAY OF THE CHEST 11/15/2023 09:04:00 AM COMPARISON: Chest radiograph 10/19/2020. CLINICAL HISTORY: Shortness of breath. Reports shob started yesterday, dark urine, chills/shaking, nausea and overall not feeling well. FINDINGS: LUNGS AND PLEURA: Low lung volumes. No focal pulmonary opacity. No pulmonary edema. No pleural effusion. No pneumothorax. HEART AND MEDIASTINUM: No acute abnormality of the cardiac and mediastinal silhouettes. BONES AND SOFT TISSUES: No acute osseous abnormality.  IMPRESSION: 1. No acute cardiopulmonary process. 2. Low lung volumes. Electronically signed by: Donnice Mania MD 11/15/2023 09:27 AM EDT RP Workstation: [id number]    Microbiology: Results for orders placed or performed during the hospital encounter of 11/15/23  Blood Culture (routine x 2)     Status: None (Preliminary result)   Collection Time: 11/15/23  9:04 AM   Specimen: BLOOD  Result Value Ref Range Status   Specimen Description BLOOD BLOOD LEFT HAND  Final   Special Requests   Final    BOTTLES DRAWN AEROBIC AND ANAEROBIC Blood Culture results may not be optimal due to an inadequate volume of blood received in culture bottles   Culture   Final    NO GROWTH 2 DAYS Performed at [facility], [address], [place], [place] [address]    Report Status PENDING  Incomplete  Blood Culture (routine x 2)     Status: None (Preliminary result)   Collection Time: 11/15/23  9:16 AM   Specimen: BLOOD  Result Value Ref Range Status   Specimen Description BLOOD RIGHT ANTECUBITAL  Final   Special Requests   Final    BOTTLES DRAWN AEROBIC AND ANAEROBIC Blood Culture adequate volume   Culture   Final    NO GROWTH 2 DAYS Performed at [facility], [address] [place], [place] [address]    Report Status PENDING  Incomplete  Urine Culture     Status: Abnormal   Collection Time: 11/15/23 10:43 AM   Specimen: Urine, Random  Result Value Ref Range Status   Specimen Description   Final    URINE, RANDOM Performed at [facility], [address]  [place], [place] [address]    Special Requests   Final    NONE Reflexed from [phone] Performed at [facility], [address] [place], [place] [address]    Culture >=100,000 COLONIES/mL ESCHERICHIA COLI (A)  Final   Report Status 11/17/2023 FINAL  Final   Organism ID, Bacteria ESCHERICHIA COLI (A)  Final      Susceptibility   Escherichia coli - MIC*    AMPICILLIN >=32 RESISTANT Resistant     CEFAZOLIN  (URINE) Value in next row Sensitive      8 SENSITIVEThis is a modified FDA-approved test that has been validated and its performance characteristics determined by the reporting laboratory.  This laboratory is certified under the Clinical Laboratory Improvement Amendments CLIA as qualified to perform high complexity clinical laboratory testing.    CEFEPIME Value in next row Sensitive      8 SENSITIVEThis is a modified FDA-approved test that has been validated and its performance characteristics determined by the reporting laboratory.  This laboratory is certified under the Clinical Laboratory Improvement Amendments CLIA as qualified to perform high complexity clinical laboratory testing.    ERTAPENEM Value in next row Sensitive      8 SENSITIVEThis is a modified FDA-approved test that has been validated and its performance characteristics determined by the reporting laboratory.  This laboratory is certified under the Clinical Laboratory Improvement Amendments CLIA as qualified to perform high complexity clinical laboratory testing.    CEFTRIAXONE  Value in next row Sensitive      8 SENSITIVEThis is a modified FDA-approved test that has been validated and its performance characteristics determined by the reporting laboratory.  This laboratory is certified under the Clinical Laboratory Improvement Amendments CLIA as qualified to perform high complexity clinical laboratory testing.    CIPROFLOXACIN Value in next row Resistant      8 SENSITIVEThis is a modified FDA-approved test that has been validated and its performance characteristics determined by the reporting laboratory.  This laboratory is certified under the Clinical Laboratory Improvement Amendments CLIA as qualified to perform high complexity clinical laboratory testing.    GENTAMICIN Value in next row Resistant      8 SENSITIVEThis is a modified FDA-approved test that has been validated and its performance characteristics determined by the reporting  laboratory.  This laboratory is certified under the Clinical Laboratory Improvement Amendments CLIA as qualified to perform high complexity clinical laboratory testing.    NITROFURANTOIN Value in next row Sensitive      8 SENSITIVEThis is a modified FDA-approved test that has been validated and its performance characteristics determined by the reporting laboratory.  This laboratory is certified under the Clinical Laboratory Improvement Amendments CLIA as qualified to perform high complexity clinical laboratory testing.    TRIMETH/SULFA Value in next row Resistant      8 SENSITIVEThis is a modified FDA-approved test that has been validated and its performance characteristics determined by the reporting laboratory.  This laboratory is certified under the Clinical Laboratory Improvement Amendments CLIA as qualified to perform high complexity clinical laboratory testing.    AMPICILLIN/SULBACTAM Value in next row Resistant      8 SENSITIVEThis is a modified FDA-approved test that has been validated and its performance characteristics determined by the reporting laboratory.  This laboratory is certified under the Clinical Laboratory Improvement Amendments CLIA as qualified to perform high complexity clinical laboratory testing.    PIP/TAZO Value in next row Sensitive      8 SENSITIVEThis is a modified FDA-approved test that has  been validated and its performance characteristics determined by the reporting laboratory.  This laboratory is certified under the Clinical Laboratory Improvement Amendments CLIA as qualified to perform high complexity clinical laboratory testing.    MEROPENEM Value in next row Sensitive      8 SENSITIVEThis is a modified FDA-approved test that has been validated and its performance characteristics determined by the reporting laboratory.  This laboratory is certified under the Clinical Laboratory Improvement Amendments CLIA as qualified to perform high complexity clinical laboratory  testing.    * >=100,000 COLONIES/mL ESCHERICHIA COLI    Labs: CBC: Recent Labs  Lab 11/15/23 0904 11/16/23 0428 11/17/23 0441  WBC 10.4 5.1 3.9*  NEUTROABS 9.2*  --   --   HGB 13.3 9.8* 11.1*  HCT 41.5 30.8* 34.4*  MCV 88.7 90.9 88.9  PLT 143* 100* 115*   Basic Metabolic Panel: Recent Labs  Lab 11/15/23 0904 11/16/23 0428 11/17/23 0441  NA 136 132* 130*  K 4.8 4.6 3.7  CL 102 101 101  CO2 21* 20* 21*  GLUCOSE 116* 99 108*  BUN 37* 31* 27*  CREATININE 1.97* 1.40* 1.20  CALCIUM  9.4 9.0 9.0  MG 1.5* 1.6* 1.9   Liver Function Tests: Recent Labs  Lab 11/15/23 0904  AST 35  ALT 24  ALKPHOS 59  BILITOT 2.1*  PROT 7.0  ALBUMIN 3.4*   CBG: No results for input(s): GLUCAP in the last 168 hours.  Discharge time spent: greater than 30 minutes.  Signed: Laree Lock, MD [facility] 11/17/2023

## 2023-11-17 NOTE — Consult Note (Signed)
 PHARMACY - ANTICOAGULATION CONSULT NOTE  Pharmacy Consult for Warfarin Indication: atrial fibrillation and mAVR  Allergies  Allergen Reactions   Lovenox  [Enoxaparin  Sodium] Itching   Patient Measurements: Height: 5' 7 (170.2 cm) Weight: (!) 176 kg (388 lb) IBW/kg (Calculated) : 66.1 HEPARIN  DW (KG): 110.6  Labs: Recent Labs    11/15/23 0904 11/15/23 1057 11/16/23 0428 11/17/23 0441  HGB 13.3  --  9.8* 11.1*  HCT 41.5  --  30.8* 34.4*  PLT 143*  --  100* 115*  LABPROT 36.2*  --  35.2* 27.0*  INR 3.4*  --  3.3* 2.4*  CREATININE 1.97*  --  1.40* 1.20  CKTOTAL 54  --   --   --   TROPONINIHS 10 7  --   --     Estimated Creatinine Clearance: 113.4 mL/min (by C-G formula based on SCr of 1.2 mg/dL).   Medical History: Past Medical History:  Diagnosis Date   Anxiety    Aortic valve stenosis    Atrial fibrillation (HCC)    CHF (congestive heart failure) (HCC)    Depression    Diabetes mellitus without complication (HCC)    Hypertension     Medications:  Warfarin 2 mg every evening. Last patient reported dose was 11/14/23  Assessment: 53 y/o M with medical history including Afib and mAVR presenting with concerns for sepsis complicated by AKI. Pharmacy consulted to dose warfarin while admitted.  His Tbili is 2.1 which is elevated from 0.6 on 11/04/23 which may indicate some liver dysfunction. Albumin is 3.4 from 4.4 on 11/04/23. Baseline CBC with normal H&H, platelets mildly low  DDIs: Ceftriaxone . Home amiodarone  was re-started 9/20  Date INR Plan  9/19 3.4 1 mg  9/20 3.3 2 mg  9/21 2.4 3 mg   Goal of Therapy:  INR 2.5 - 3.5  Plan:  --Give warfarin 3 mg tonight --Daily INR per protocol  Marolyn KATHEE Mare 11/17/2023,8:56 AM

## 2023-11-17 NOTE — Plan of Care (Signed)

## 2023-11-18 ENCOUNTER — Telehealth: Payer: Self-pay

## 2023-11-18 NOTE — Transitions of Care (Post Inpatient/ED Visit) (Signed)
   11/18/2023  Name: Russell Ray MRN: [id number] DOB: Jun 14, 1970  Today's TOC FU Call Status: Today's TOC FU Call Status:: Successful TOC FU Call Completed TOC FU Call Complete Date: 11/18/23  Attempted to reach the patient regarding the most recent Inpatient/ED visit.  Follow Up Plan: Additional outreach attempts will be made to reach the patient to complete the Transitions of Care (Post Inpatient/ED visit) call.   Medford Balboa, BSN, RN [place]  [facility] - [facility] RN Care Manager [phone]

## 2023-11-19 ENCOUNTER — Telehealth: Payer: Self-pay

## 2023-11-19 NOTE — Transitions of Care (Post Inpatient/ED Visit) (Signed)
   11/19/2023  Name: Russell Ray MRN: [id number] DOB: 12-09-1970  Today's TOC FU Call Status: Today's TOC FU Call Status:: Unsuccessful Call (2nd Attempt) Unsuccessful Call (1st Attempt) Date: 11/18/23 Unsuccessful Call (2nd Attempt) Date: 11/19/23  Attempted to reach the patient regarding the most recent Inpatient/ED visit.  Follow Up Plan: Additional outreach attempts will be made to reach the patient to complete the Transitions of Care (Post Inpatient/ED visit) call.   Medford Balboa, BSN, RN [place]  [facility] - [facility] RN Care Manager [phone]

## 2023-11-19 NOTE — Transitions of Care (Post Inpatient/ED Visit) (Signed)
 11/19/2023  Name: Russell Ray MRN: [id number] DOB: 13-Jul-1970  Today's TOC FU Call Status: TOC FU Call Complete Date: 11/19/23 Patient's Name and Date of Birth confirmed.  Transition Care Management Follow-up Telephone Call Date of Discharge: 11/17/23 Discharge Facility: [facility]) Type of Discharge: Inpatient Admission Primary Inpatient Discharge Diagnosis:: Sepsis, UTI How have you been since you were released from the hospital?: Better Any questions or concerns?: No  Items Reviewed: Did you receive and understand the discharge instructions provided?: Yes Medications obtained,verified, and reconciled?: Yes (Medications Reviewed) Any new allergies since your discharge?: No Dietary orders reviewed?: NA Do you have support at home?: Yes People in Home [RPT]: alone Name of Support/Comfort Primary Source: Carole Sheldon  Medications Reviewed Today: Medications Reviewed Today     Reviewed by Moises Reusing, RN (Case Manager) on 11/19/23 at 1503  Med List Status: <None>   Medication Order Taking? Sig Documenting Provider Last Dose Status Informant  acetaminophen  (TYLENOL ) 325 MG tablet [id number] No Take 650 mg by mouth every 4 (four) hours as needed for moderate pain. [provider] 11/15/2023 Morning Active Self  amiodarone  (PACERONE ) 200 MG tablet [id number] No TAKE 1 TABLET BY MOUTH TWICE A DAY Fernand Denyse LABOR, MD 11/15/2023 Morning Active Self  budesonide -formoterol  (SYMBICORT ) 80-4.5 MCG/ACT inhaler [id number] No Inhale 2 puffs into the lungs 2 (two) times daily. Tejan-Sie, S Ahmed, MD Unknown Active Self  celecoxib  (CELEBREX ) 400 MG capsule [id number] No Take 400 mg by mouth daily. [provider] 11/15/2023 Morning Active Self  cephALEXin  (KEFLEX ) 500 MG capsule [id number]  Take 1 capsule (500 mg total) by mouth 4 (four) times daily for 7 days. Jerelene Critchley, MD  Active   cetirizine  (ZYRTEC  ALLERGY) 10 MG tablet [id number] No Take 1  tablet (10 mg total) by mouth daily. Tejan-Sie, S Ahmed, MD Unknown Active Self  cyanocobalamin  1000 MCG tablet [id number] No Take 1,000 mcg by mouth daily.  Patient not taking: Reported on 05/04/2022   [provider] Not Taking Active Self  fluticasone  (FLONASE ) 50 MCG/ACT nasal spray [id number] No Place 1 spray into both nostrils daily. Tejan-Sie, S Ahmed, MD Unknown Active Self  ibandronate  (BONIVA ) 150 MG tablet [id number] No Take 1 tablet (150 mg total) by mouth every 30 (thirty) days. Take in the morning with a full glass of water, on an empty stomach, and do not take anything else by mouth or lie down for the next 30 min. Tejan-Sie, S Ahmed, MD Past Week Active Self  Iron, Ferrous Sulfate , 142 (45 Fe) MG TBCR [id number] No Take 1 tablet by mouth daily.  Patient not taking: Reported on 05/04/2022   [provider] Not Taking Active Self  lisinopril  (ZESTRIL ) 20 MG tablet [id number] No TAKE 1 TABLET BY MOUTH EVERY DAY Albina GORMAN Dine, MD 11/15/2023 Morning Active Self  LORazepam  (ATIVAN ) 1 MG tablet [id number] No Take 1 tablet (1 mg total) by mouth 2 (two) times daily. Albina GORMAN Dine, MD 11/15/2023 Morning Active Self  LORazepam  (ATIVAN ) 1 MG tablet [id number]  Take 1 tablet (1 mg total) by mouth 2 (two) times daily. Albina GORMAN Dine, MD  Active Self  magnesium  oxide (MAG-OX) 400 MG tablet [id number] No TAKE 2 TABLETS BY MOUTH TWICE A DAY Albina GORMAN Dine, MD 11/15/2023 Morning Active Self           Med Note [facility], ELIZABETH A   Fri Nov 15, 2023  1:06 PM) 800MG  today  Magnesium  Oxide 400 (240 Mg) MG TABS [id number]  Take 1 tablet (400 mg total) by mouth every morning. Josette Ade, MD  Active Self  metoprolol  succinate (TOPROL -XL) 50 MG 24 hr tablet [id number] No TAKE 1 TABLET BY MOUTH EVERY DAY Fernand Denyse LABOR, MD 11/15/2023 Morning Active Self  ondansetron  (ZOFRAN ) 4 MG tablet [id number] No TAKE 1 TABLET BY MOUTH TWICE A DAY Tejan-Sie, S Ahmed, MD Unknown Active Self   ondansetron  (ZOFRAN -ODT) 4 MG disintegrating tablet [id number]  Take 1 tablet (4 mg total) by mouth every 8 (eight) hours as needed. Albina GORMAN Dine, MD  Active Self  oxyCODONE  (ROXICODONE ) 5 MG immediate release tablet [id number]  Take 1 tablet (5 mg total) by mouth every 6 (six) hours as needed for severe pain (pain score 7-10). Jerelene Critchley, MD  Active   pioglitazone  (ACTOS ) 45 MG tablet [id number] No Take 45 mg by mouth daily.  Patient not taking: Reported on 05/04/2022   [provider] Not Taking Active Self  rosuvastatin  (CRESTOR ) 20 MG tablet [id number] No TAKE 1 TABLET BY MOUTH EVERY DAY IN THE EVENING Fernand Denyse LABOR, MD 11/14/2023 Evening Active Self  tirzepatide  (MOUNJARO ) 2.5 MG/0.5ML Pen [id number] No INJECT 2.5 MG SUBCUTANEOUSLY WEEKLY  Patient not taking: No sig reported   Albina GORMAN Dine, MD Not Taking Active Self  tirzepatide  (MOUNJARO ) 5 MG/0.5ML Pen [id number] No Inject 5 mg into the skin once a week. Albina GORMAN Dine, MD 11/13/2023 Active Self  warfarin (COUMADIN ) 2 MG tablet [id number] No TAKE 1 TABLET BY MOUTH DAILY OR AS DIRECTED Fernand Denyse LABOR, MD 11/14/2023 Evening Active Self            Home Care and Equipment/Supplies: Were Home Health Services Ordered?: NA Any new equipment or medical supplies ordered?: NA  Functional Questionnaire: Do you need assistance with bathing/showering or dressing?: No Do you need assistance with meal preparation?: No Do you need assistance with eating?: No Do you have difficulty maintaining continence: No Do you need assistance with getting out of bed/getting out of a chair/moving?: No Do you have difficulty managing or taking your medications?: No  Follow up appointments reviewed: PCP Follow-up appointment confirmed?: Yes Date of PCP follow-up appointment?: 11/22/23 Follow-up Provider: D[facility] Follow-up appointment confirmed?: NA Do you need transportation to your follow-up  appointment?: No Do you understand care options if your condition(s) worsen?: Yes-patient verbalized understanding  SDOH Interventions Today    Flowsheet Row Most Recent Value  SDOH Interventions   Food Insecurity Interventions Intervention Not Indicated  Housing Interventions Intervention Not Indicated  Transportation Interventions Intervention Not Indicated  Utilities Interventions Intervention Not Indicated    Medford Balboa, BSN, RN [place]  [facility] - [facility] RN Care Manager [phone]

## 2023-11-20 LAB — CULTURE, BLOOD (ROUTINE X 2)
Culture: NO GROWTH
Culture: NO GROWTH
Special Requests: ADEQUATE

## 2023-11-22 ENCOUNTER — Other Ambulatory Visit: Payer: Self-pay | Admitting: Internal Medicine

## 2023-11-22 ENCOUNTER — Ambulatory Visit (INDEPENDENT_AMBULATORY_CARE_PROVIDER_SITE_OTHER): Admitting: Internal Medicine

## 2023-11-22 VITALS — BP 128/74 | HR 74 | Ht 71.0 in | Wt 387.0 lb

## 2023-11-22 DIAGNOSIS — E119 Type 2 diabetes mellitus without complications: Secondary | ICD-10-CM | POA: Diagnosis not present

## 2023-11-22 DIAGNOSIS — Z0130 Encounter for examination of blood pressure without abnormal findings: Secondary | ICD-10-CM

## 2023-11-22 DIAGNOSIS — E785 Hyperlipidemia, unspecified: Secondary | ICD-10-CM

## 2023-11-22 DIAGNOSIS — F419 Anxiety disorder, unspecified: Secondary | ICD-10-CM

## 2023-11-22 DIAGNOSIS — N179 Acute kidney failure, unspecified: Secondary | ICD-10-CM

## 2023-11-22 LAB — POCT CBG (FASTING - GLUCOSE)-MANUAL ENTRY: Glucose Fasting, POC: 109 mg/dL — AB (ref 70–99)

## 2023-11-22 MED ORDER — LORAZEPAM 1 MG PO TABS: 1.0000 mg | ORAL_TABLET | Freq: Two times a day (BID) | ORAL | 2 refills | Status: DC

## 2023-11-22 NOTE — Progress Notes (Signed)
 Established Patient Office Visit  Subjective:  Patient ID: Russell Ray, male    DOB: 04-06-1970  Age: 53 y.o. MRN: [id number]  Chief Complaint  Patient presents with   Follow-up    3 month follow up     No new complaints, here for hospital follow up. Was admitted with urosepsis and AKI.     No other concerns at this time.   Past Medical History:  Diagnosis Date   Anxiety    Aortic valve stenosis    Atrial fibrillation (HCC)    CHF (congestive heart failure) (HCC)    Depression    Diabetes mellitus without complication (HCC)    Hypertension     Past Surgical History:  Procedure Laterality Date   APPLICATION OF WOUND VAC     CARDIAC CATHETERIZATION Right 04/22/2015   Procedure: Right/Left Heart Cath and Coronary Angiography;  Surgeon: Denyse DELENA Bathe, MD;  Location: [facility] INVASIVE CV LAB;  Service: Cardiovascular;  Laterality: Right;   CARDIAC VALVE REPLACEMENT     CARDIOVERSION N/A 05/14/2017   Procedure: CARDIOVERSION;  Surgeon: Bathe Denyse DELENA, MD;  Location: [facility] ORS;  Service: Cardiovascular;  Laterality: N/A;   COLONOSCOPY N/A 10/21/2020   Procedure: COLONOSCOPY;  Surgeon: Toledo, Ladell POUR, MD;  Location: [facility] ENDOSCOPY;  Service: Gastroenterology;  Laterality: N/A;   COLONOSCOPY WITH PROPOFOL  N/A 10/05/2016   Procedure: COLONOSCOPY WITH PROPOFOL ;  Surgeon: Viktoria Lamar DASEN, MD;  Location: [facility] ENDOSCOPY;  Service: Endoscopy;  Laterality: N/A;   debridement sternum     ELECTROPHYSIOLOGIC STUDY N/A 05/05/2015   Procedure: Cardioversion;  Surgeon: Denyse DELENA Bathe, MD;  Location: [facility] ORS;  Service: Cardiovascular;  Laterality: N/A;   ELECTROPHYSIOLOGIC STUDY N/A 05/17/2015   Procedure: CARDIOVERSION;  Surgeon: Denyse DELENA Bathe, MD;  Location: [facility] ORS;  Service: Cardiovascular;  Laterality: N/A;   ESOPHAGOGASTRODUODENOSCOPY (EGD) WITH PROPOFOL  N/A 10/05/2016   Procedure: ESOPHAGOGASTRODUODENOSCOPY (EGD) WITH PROPOFOL ;  Surgeon: Viktoria Lamar DASEN, MD;  [facility] ENDOSCOPY;   Service: Endoscopy;  Laterality: N/A;   GREATER OMENTAL FLAP CLOSURE     HERNIA REPAIR     X3   VALVE REPLACEMENT     VENTRAL HERNIA REPAIR N/A 12/01/2016   Procedure: HERNIA REPAIR VENTRAL ADULT with mesh;  Surgeon: Jordis Laneta FALCON, MD;  Location: [facility] ORS;  Service: General;  Laterality: N/A;    Social History   Socioeconomic History   Marital status: Single    Spouse name: Not on file   Number of children: Not on file   Years of education: Not on file   Highest education level: Not on file  Occupational History   Occupation: Transit Driver  Tobacco Use   Smoking status: Former    Current packs/day: 2.00    Average packs/day: 2.0 packs/day for 28.0 years (56.0 ttl pk-yrs)    Types: Cigarettes   Smokeless tobacco: Never  Vaping Use   Vaping status: Never Used  Substance and Sexual Activity   Alcohol use: No   Drug use: No   Sexual activity: Yes  Other Topics Concern   Not on file  Social History Narrative   Not on file   Social Drivers of Health   Financial Resource Strain: Low Risk  (10/14/2023)   Received from [facility]   Overall Financial Resource Strain (CARDIA)    Difficulty of Paying Living Expenses: Not very hard  Food Insecurity: No Food Insecurity (11/19/2023)   Hunger Vital Sign    Worried About Running Out of Food in  the Last Year: Never true    Ran Out of Food in the Last Year: Never true  Transportation Needs: No Transportation Needs (11/19/2023)   PRAPARE - Administrator, Civil Service (Medical): No    Lack of Transportation (Non-Medical): No  Physical Activity: Not on file  Stress: Not on file  Social Connections: Unknown (07/11/2021)   Received from [facility] [place]   Social Network    Social Network: Not on file  Intimate Partner Violence: Not At Risk (11/19/2023)   Humiliation, Afraid, Rape, and Kick questionnaire    Fear of Current or Ex-Partner: No    Emotionally Abused: No    Physically Abused: No    Sexually  Abused: No    Family History  Problem Relation Age of Onset   Hypertension Mother    Hypertension Maternal Grandmother    Diabetes Maternal Grandmother     Allergies  Allergen Reactions   Lovenox  [Enoxaparin  Sodium] Itching    Outpatient Medications Prior to Visit  Medication Sig   acetaminophen  (TYLENOL ) 325 MG tablet Take 650 mg by mouth every 4 (four) hours as needed for moderate pain.   amiodarone  (PACERONE ) 200 MG tablet TAKE 1 TABLET BY MOUTH TWICE A DAY   budesonide -formoterol  (SYMBICORT ) 80-4.5 MCG/ACT inhaler Inhale 2 puffs into the lungs 2 (two) times daily.   celecoxib  (CELEBREX ) 400 MG capsule Take 400 mg by mouth daily.   cephALEXin  (KEFLEX ) 500 MG capsule Take 1 capsule (500 mg total) by mouth 4 (four) times daily for 7 days.   cetirizine  (ZYRTEC  ALLERGY) 10 MG tablet Take 1 tablet (10 mg total) by mouth daily.   cyanocobalamin  1000 MCG tablet Take 1,000 mcg by mouth daily. (Patient not taking: Reported on 05/04/2022)   fluticasone  (FLONASE ) 50 MCG/ACT nasal spray Place 1 spray into both nostrils daily.   ibandronate  (BONIVA ) 150 MG tablet Take 1 tablet (150 mg total) by mouth every 30 (thirty) days. Take in the morning with a full glass of water, on an empty stomach, and do not take anything else by mouth or lie down for the next 30 min.   Iron, Ferrous Sulfate , 142 (45 Fe) MG TBCR Take 1 tablet by mouth daily. (Patient not taking: Reported on 05/04/2022)   [Paused] lisinopril  (ZESTRIL ) 20 MG tablet TAKE 1 TABLET BY MOUTH EVERY DAY   LORazepam  (ATIVAN ) 1 MG tablet Take 1 tablet (1 mg total) by mouth 2 (two) times daily.   LORazepam  (ATIVAN ) 1 MG tablet Take 1 tablet (1 mg total) by mouth 2 (two) times daily.   magnesium  oxide (MAG-OX) 400 MG tablet TAKE 2 TABLETS BY MOUTH TWICE A DAY   Magnesium  Oxide 400 (240 Mg) MG TABS Take 1 tablet (400 mg total) by mouth every morning.   metoprolol  succinate (TOPROL -XL) 50 MG 24 hr tablet TAKE 1 TABLET BY MOUTH EVERY DAY   ondansetron   (ZOFRAN ) 4 MG tablet TAKE 1 TABLET BY MOUTH TWICE A DAY   ondansetron  (ZOFRAN -ODT) 4 MG disintegrating tablet Take 1 tablet (4 mg total) by mouth every 8 (eight) hours as needed.   oxyCODONE  (ROXICODONE ) 5 MG immediate release tablet Take 1 tablet (5 mg total) by mouth every 6 (six) hours as needed for severe pain (pain score 7-10).   pioglitazone  (ACTOS ) 45 MG tablet Take 45 mg by mouth daily. (Patient not taking: Reported on 05/04/2022)   rosuvastatin  (CRESTOR ) 20 MG tablet TAKE 1 TABLET BY MOUTH EVERY DAY IN THE EVENING   tirzepatide  (MOUNJARO ) 2.5 MG/0.5ML Pen  INJECT 2.5 MG SUBCUTANEOUSLY WEEKLY (Patient not taking: No sig reported)   warfarin (COUMADIN ) 2 MG tablet TAKE 1 TABLET BY MOUTH DAILY OR AS DIRECTED   No facility-administered medications prior to visit.    Review of Systems  Constitutional: Negative.  Negative for chills and fever.  HENT: Negative.    Eyes: Negative.   Respiratory: Negative.    Cardiovascular: Negative.   Gastrointestinal: Negative.   Genitourinary: Negative.   Skin: Negative.   Neurological: Negative.   Endo/Heme/Allergies: Negative.        Objective:   BP 128/74   Pulse 74   Ht 5' 11 (1.803 m)   Wt (!) 387 lb (175.5 kg)   SpO2 98%   BMI 53.98 kg/m   Vitals:   11/22/23 1051  BP: 128/74  Pulse: 74  Height: 5' 11 (1.803 m)  Weight: (!) 387 lb (175.5 kg)  SpO2: 98%  BMI (Calculated): 54    Physical Exam Vitals reviewed.  Constitutional:      Appearance: Normal appearance.  HENT:     Head: Normocephalic.     Left Ear: There is no impacted cerumen.     Nose: Nose normal.     Mouth/Throat:     Mouth: Mucous membranes are moist.     Pharynx: No posterior oropharyngeal erythema.  Eyes:     Extraocular Movements: Extraocular movements intact.     Pupils: Pupils are equal, round, and reactive to light.  Cardiovascular:     Rate and Rhythm: Regular rhythm.     Chest Wall: PMI is not displaced.     Pulses: Normal pulses.     Heart  sounds: Normal heart sounds. No murmur heard. Pulmonary:     Effort: Pulmonary effort is normal.     Breath sounds: Normal air entry. No rhonchi or rales.  Abdominal:     General: Abdomen is flat. Bowel sounds are normal. There is no distension.     Palpations: Abdomen is soft. There is no hepatomegaly, splenomegaly or mass.     Tenderness: There is no abdominal tenderness.     Comments: Large pannus  Musculoskeletal:        General: Normal range of motion.     Cervical back: Normal range of motion and neck supple.     Right lower leg: No edema.     Left lower leg: No edema.  Skin:    General: Skin is warm and dry.  Neurological:     General: No focal deficit present.     Mental Status: He is alert and oriented to person, place, and time.     Cranial Nerves: No cranial nerve deficit.     Motor: No weakness.  Psychiatric:        Mood and Affect: Mood normal.        Behavior: Behavior normal.      Results for orders placed or performed in visit on 11/22/23  POCT CBG (Fasting - Glucose)  Result Value Ref Range   Glucose Fasting, POC 109 (A) 70 - 99 mg/dL        Assessment & Plan:  Russell Ray was seen today for follow-up.  Type 2 diabetes mellitus without complication, without long-term current use of insulin  (HCC) -     POCT CBG (Fasting - Glucose)  AKI (acute kidney injury)    Problem List Items Addressed This Visit       Endocrine   Type 2 diabetes mellitus without complication - Primary   Relevant  Orders   POCT CBG (Fasting - Glucose) (Completed)    No follow-ups on file.   Total time spent: 20 minutes  Sherrill Cinderella Perry, MD  11/22/2023   This document may have been prepared by [facility] Voice Recognition software and as such may include unintentional dictation errors.

## 2023-11-23 LAB — LIPID PANEL
Chol/HDL Ratio: 4.1 ratio (ref 0.0–5.0)
Cholesterol, Total: 130 mg/dL (ref 100–199)
HDL: 32 mg/dL — ABNORMAL LOW (ref >39)
LDL Chol Calc (NIH): 71 mg/dL (ref 0–99)
Triglycerides: 152 mg/dL — ABNORMAL HIGH (ref 0–149)
VLDL Cholesterol Cal: 27 mg/dL (ref 5–40)

## 2023-11-23 LAB — BMP8+ANION GAP
Anion Gap: 17 mmol/L (ref 10.0–18.0)
BUN/Creatinine Ratio: 15 (ref 9–20)
BUN: 13 mg/dL (ref 6–24)
CO2: 20 mmol/L (ref 20–29)
Calcium: 9.7 mg/dL (ref 8.7–10.2)
Chloride: 100 mmol/L (ref 96–106)
Creatinine, Ser: 0.88 mg/dL (ref 0.76–1.27)
Glucose: 93 mg/dL (ref 70–99)
Potassium: 3.9 mmol/L (ref 3.5–5.2)
Sodium: 137 mmol/L (ref 134–144)
eGFR: 104 mL/min/1.73 (ref >59)

## 2023-11-25 ENCOUNTER — Ambulatory Visit: Payer: Self-pay | Admitting: Internal Medicine

## 2023-11-26 ENCOUNTER — Other Ambulatory Visit: Payer: Self-pay | Admitting: Internal Medicine

## 2023-11-27 ENCOUNTER — Ambulatory Visit: Admitting: Internal Medicine

## 2023-11-28 ENCOUNTER — Other Ambulatory Visit: Payer: Self-pay | Admitting: Cardiovascular Disease

## 2023-11-28 ENCOUNTER — Other Ambulatory Visit: Payer: Self-pay | Admitting: Internal Medicine

## 2023-11-28 DIAGNOSIS — E119 Type 2 diabetes mellitus without complications: Secondary | ICD-10-CM

## 2023-12-12 ENCOUNTER — Other Ambulatory Visit: Payer: Self-pay | Admitting: Internal Medicine

## 2023-12-12 DIAGNOSIS — M25569 Pain in unspecified knee: Secondary | ICD-10-CM

## 2023-12-13 ENCOUNTER — Ambulatory Visit: Payer: Self-pay | Admitting: Internal Medicine

## 2023-12-13 ENCOUNTER — Other Ambulatory Visit: Payer: Self-pay | Admitting: Internal Medicine

## 2023-12-13 DIAGNOSIS — E785 Hyperlipidemia, unspecified: Secondary | ICD-10-CM

## 2023-12-13 MED ORDER — ROSUVASTATIN CALCIUM 40 MG PO TABS: 40.0000 mg | ORAL_TABLET | Freq: Every day | ORAL | 1 refills | Status: DC

## 2023-12-20 ENCOUNTER — Other Ambulatory Visit: Payer: Self-pay | Admitting: Cardiovascular Disease

## 2023-12-20 DIAGNOSIS — I4891 Unspecified atrial fibrillation: Secondary | ICD-10-CM

## 2023-12-29 ENCOUNTER — Other Ambulatory Visit: Payer: Self-pay | Admitting: Cardiovascular Disease

## 2023-12-29 DIAGNOSIS — E785 Hyperlipidemia, unspecified: Secondary | ICD-10-CM

## 2023-12-30 ENCOUNTER — Other Ambulatory Visit: Payer: Self-pay | Admitting: Internal Medicine

## 2023-12-30 DIAGNOSIS — E785 Hyperlipidemia, unspecified: Secondary | ICD-10-CM

## 2023-12-30 MED ORDER — ROSUVASTATIN CALCIUM 20 MG PO TABS: 20.0000 mg | ORAL_TABLET | Freq: Every day | ORAL | 1 refills | Status: DC

## 2024-01-06 ENCOUNTER — Encounter: Payer: Self-pay | Admitting: Emergency Medicine

## 2024-01-06 ENCOUNTER — Emergency Department

## 2024-01-06 ENCOUNTER — Observation Stay
Admission: EM | Admit: 2024-01-06 | Discharge: 2024-01-07 | Disposition: A | Attending: Internal Medicine | Admitting: Internal Medicine

## 2024-01-06 ENCOUNTER — Other Ambulatory Visit: Payer: Self-pay

## 2024-01-06 DIAGNOSIS — I10 Essential (primary) hypertension: Secondary | ICD-10-CM | POA: Diagnosis present

## 2024-01-06 DIAGNOSIS — A419 Sepsis, unspecified organism: Principal | ICD-10-CM | POA: Diagnosis present

## 2024-01-06 DIAGNOSIS — Z959 Presence of cardiac and vascular implant and graft, unspecified: Secondary | ICD-10-CM | POA: Diagnosis not present

## 2024-01-06 DIAGNOSIS — N179 Acute kidney failure, unspecified: Principal | ICD-10-CM | POA: Insufficient documentation

## 2024-01-06 DIAGNOSIS — R8271 Bacteriuria: Secondary | ICD-10-CM

## 2024-01-06 DIAGNOSIS — E861 Hypovolemia: Secondary | ICD-10-CM | POA: Diagnosis not present

## 2024-01-06 DIAGNOSIS — I480 Paroxysmal atrial fibrillation: Secondary | ICD-10-CM | POA: Diagnosis not present

## 2024-01-06 DIAGNOSIS — I110 Hypertensive heart disease with heart failure: Secondary | ICD-10-CM | POA: Diagnosis not present

## 2024-01-06 DIAGNOSIS — Z952 Presence of prosthetic heart valve: Secondary | ICD-10-CM

## 2024-01-06 DIAGNOSIS — I5032 Chronic diastolic (congestive) heart failure: Secondary | ICD-10-CM | POA: Diagnosis not present

## 2024-01-06 DIAGNOSIS — E66813 Obesity, class 3: Secondary | ICD-10-CM | POA: Insufficient documentation

## 2024-01-06 DIAGNOSIS — E119 Type 2 diabetes mellitus without complications: Secondary | ICD-10-CM | POA: Diagnosis not present

## 2024-01-06 DIAGNOSIS — M549 Dorsalgia, unspecified: Secondary | ICD-10-CM | POA: Diagnosis not present

## 2024-01-06 DIAGNOSIS — N3000 Acute cystitis without hematuria: Secondary | ICD-10-CM

## 2024-01-06 DIAGNOSIS — N390 Urinary tract infection, site not specified: Secondary | ICD-10-CM | POA: Diagnosis not present

## 2024-01-06 DIAGNOSIS — R0989 Other specified symptoms and signs involving the circulatory and respiratory systems: Secondary | ICD-10-CM | POA: Diagnosis not present

## 2024-01-06 DIAGNOSIS — R0602 Shortness of breath: Secondary | ICD-10-CM | POA: Diagnosis not present

## 2024-01-06 LAB — URINALYSIS, ROUTINE W REFLEX MICROSCOPIC
Bilirubin Urine: NEGATIVE
Glucose, UA: NEGATIVE mg/dL
Ketones, ur: NEGATIVE mg/dL
Nitrite: NEGATIVE
Protein, ur: 100 mg/dL — AB
RBC / HPF: >50 RBC/hpf (ref 0–5)
Specific Gravity, Urine: 1.029 (ref 1.005–1.030)
WBC, UA: >50 WBC/hpf (ref 0–5)
pH: 5 (ref 5.0–8.0)

## 2024-01-06 LAB — CBC
HCT: 40.5 % (ref 39.0–52.0)
Hemoglobin: 12.9 g/dL — ABNORMAL LOW (ref 13.0–17.0)
MCH: 28.3 pg (ref 26.0–34.0)
MCHC: 31.9 g/dL (ref 30.0–36.0)
MCV: 88.8 fL (ref 80.0–100.0)
Platelets: 173 K/uL (ref 150–400)
RBC: 4.56 MIL/uL (ref 4.22–5.81)
RDW: 15.3 % (ref 11.5–15.5)
WBC: 16 K/uL — ABNORMAL HIGH (ref 4.0–10.5)
nRBC: 0 % (ref 0.0–0.2)

## 2024-01-06 LAB — HEPATIC FUNCTION PANEL
ALT: 14 U/L (ref 0–44)
AST: 20 U/L (ref 15–41)
Albumin: 3.3 g/dL — ABNORMAL LOW (ref 3.5–5.0)
Alkaline Phosphatase: 68 U/L (ref 38–126)
Bilirubin, Direct: 0.6 mg/dL — ABNORMAL HIGH (ref 0.0–0.2)
Indirect Bilirubin: 1.3 mg/dL — ABNORMAL HIGH (ref 0.3–0.9)
Total Bilirubin: 1.9 mg/dL — ABNORMAL HIGH (ref 0.0–1.2)
Total Protein: 7.5 g/dL (ref 6.5–8.1)

## 2024-01-06 LAB — APTT: aPTT: 56 s — ABNORMAL HIGH (ref 24–36)

## 2024-01-06 LAB — BRAIN NATRIURETIC PEPTIDE: B Natriuretic Peptide: 166.4 pg/mL — ABNORMAL HIGH (ref 0.0–100.0)

## 2024-01-06 LAB — BASIC METABOLIC PANEL WITH GFR
Anion gap: 13 (ref 5–15)
BUN: 25 mg/dL — ABNORMAL HIGH (ref 6–20)
CO2: 19 mmol/L — ABNORMAL LOW (ref 22–32)
Calcium: 9.7 mg/dL (ref 8.9–10.3)
Chloride: 102 mmol/L (ref 98–111)
Creatinine, Ser: 1.44 mg/dL — ABNORMAL HIGH (ref 0.61–1.24)
GFR, Estimated: 58 mL/min — ABNORMAL LOW (ref >60)
Glucose, Bld: 135 mg/dL — ABNORMAL HIGH (ref 70–99)
Potassium: 4.5 mmol/L (ref 3.5–5.1)
Sodium: 134 mmol/L — ABNORMAL LOW (ref 135–145)

## 2024-01-06 LAB — LACTIC ACID, PLASMA
Lactic Acid, Venous: 1.4 mmol/L (ref 0.5–1.9)
Lactic Acid, Venous: 1.7 mmol/L (ref 0.5–1.9)

## 2024-01-06 LAB — PROTIME-INR
INR: 2.3 — ABNORMAL HIGH (ref 0.8–1.2)
Prothrombin Time: 26.5 s — ABNORMAL HIGH (ref 11.4–15.2)

## 2024-01-06 LAB — CK: Total CK: 68 U/L (ref 49–397)

## 2024-01-06 LAB — MAGNESIUM: Magnesium: 1.8 mg/dL (ref 1.7–2.4)

## 2024-01-06 LAB — TSH: TSH: 2.315 u[IU]/mL (ref 0.350–4.500)

## 2024-01-06 LAB — LIPASE, BLOOD: Lipase: 24 U/L (ref 11–51)

## 2024-01-06 LAB — TROPONIN I (HIGH SENSITIVITY): Troponin I (High Sensitivity): 110 ng/L (ref <18)

## 2024-01-06 MED ORDER — SODIUM CHLORIDE 0.9 % IV BOLUS
1000.0000 mL | Freq: Once | INTRAVENOUS | Status: AC
  Administered 2024-01-06: 1000 mL via INTRAVENOUS

## 2024-01-06 MED ORDER — SODIUM CHLORIDE 0.9 % IV SOLN
1.0000 g | INTRAVENOUS | Status: DC
  Filled 2024-01-06: qty 10

## 2024-01-06 MED ORDER — WARFARIN SODIUM 1 MG PO TABS
1.0000 mg | ORAL_TABLET | Freq: Once | ORAL | Status: AC
  Administered 2024-01-06: 1 mg via ORAL
  Filled 2024-01-06 (×2): qty 1

## 2024-01-06 MED ORDER — ALBUMIN HUMAN 25 % IV SOLN
50.0000 g | Freq: Once | INTRAVENOUS | Status: AC
  Administered 2024-01-06: 50 g via INTRAVENOUS
  Filled 2024-01-06: qty 200

## 2024-01-06 MED ORDER — WARFARIN - PHARMACIST DOSING INPATIENT
Freq: Every day | Status: DC
  Filled 2024-01-06: qty 1

## 2024-01-06 MED ORDER — SODIUM CHLORIDE 0.9% FLUSH
3.0000 mL | Freq: Two times a day (BID) | INTRAVENOUS | Status: DC
  Administered 2024-01-06 – 2024-01-07 (×2): 3 mL via INTRAVENOUS

## 2024-01-06 MED ORDER — ACETAMINOPHEN 325 MG PO TABS
650.0000 mg | ORAL_TABLET | Freq: Four times a day (QID) | ORAL | Status: DC | PRN
  Administered 2024-01-06: 650 mg via ORAL
  Filled 2024-01-06: qty 2

## 2024-01-06 MED ORDER — SODIUM CHLORIDE 0.9 % IV SOLN
2.0000 g | Freq: Once | INTRAVENOUS | Status: AC
  Administered 2024-01-06: 2 g via INTRAVENOUS
  Filled 2024-01-06: qty 20

## 2024-01-06 MED ORDER — ACETAMINOPHEN 650 MG RE SUPP: 650.0000 mg | Freq: Four times a day (QID) | RECTAL | Status: DC | PRN

## 2024-01-06 MED ORDER — SENNOSIDES-DOCUSATE SODIUM 8.6-50 MG PO TABS: 1.0000 | ORAL_TABLET | Freq: Every evening | ORAL | Status: DC | PRN

## 2024-01-06 MED ORDER — LACTATED RINGERS IV SOLN
150.0000 mL/h | INTRAVENOUS | Status: DC
  Administered 2024-01-06 – 2024-01-07 (×3): 150 mL/h via INTRAVENOUS

## 2024-01-06 NOTE — ED Notes (Signed)
 Pt insisting on walking around room. RN advised pt not to on account of passing out or injury due to falling that could lead up to serious injury or death. Pt verbalized understanding and still wanted to get out of bed.

## 2024-01-06 NOTE — H&P (Addendum)
 History and Physical    Russell Ray FMW:[id number] DOB: 1970/07/20 DOA: 01/06/2024  DOS: the patient was seen and examined on 01/06/2024  PCP: Albina GORMAN Dine, MD   Patient coming from: Home  I have personally briefly reviewed patient's old medical records in [facility] and CareEverywhere  HPI:   Russell Ray is a 53 y.o. year old male with medical history of HTN, HLD, Class III obesity s/p gastric bypass surgery, PAF on coumadin , IIDM with urinary symptoms.    Pt states he has developed lightheadedness and hypotension and some darkening urine and was thinking he developed another UTI.  He has had 3 UTIs since September of this year.  He does not report any history of UTIs apart from this.  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  Afterwards he tries to clean the area and he has been doing this for a long time.  He has been seen by surgery to correct this issue but they recommend he get some weight loss before this can be done.  Currently denies any other symptoms such as fevers or chills but does report some back pain.  Denies any dysuria and states he would like to have Keflex  at discharge as it really takes care of his urinary problems for him.   On arrival to the ED patient was noted to be HDS stable. Lab work and imaging obtained and CBC with leukocytosis at 16K, BMP with AKI and non-anion gap metabolic acidosis.  BNP at 161.  INR at 2.3.  UA with signs of infection, lactic acid normal.  Urine culture ordered blood cultures ordered.  Chest x-ray without any acute findings.  Given these and has hypotensive blood pressures, [facility] contacted for admission.  Review of Systems: As mentioned in the history of present illness. All other systems reviewed and are negative.   Past Medical History:  Diagnosis Date   Anxiety    Aortic valve  stenosis    Atrial fibrillation (HCC)    CHF (congestive heart failure) (HCC)    Depression    Diabetes mellitus without complication (HCC)    Hypertension     Past Surgical History:  Procedure Laterality Date   APPLICATION OF WOUND VAC     CARDIAC CATHETERIZATION Right 04/22/2015   Procedure: Right/Left Heart Cath and Coronary Angiography;  Surgeon: Denyse DELENA Bathe, MD;  Location: [facility] INVASIVE CV LAB;  Service: Cardiovascular;  Laterality: Right;   CARDIAC VALVE REPLACEMENT     CARDIOVERSION N/A 05/14/2017   Procedure: CARDIOVERSION;  Surgeon: Bathe Denyse DELENA, MD;  Location: [facility] ORS;  Service: Cardiovascular;  Laterality: N/A;   COLONOSCOPY N/A 10/21/2020   Procedure: COLONOSCOPY;  Surgeon: Toledo, Ladell POUR, MD;  Location: [facility] ENDOSCOPY;  Service: Gastroenterology;  Laterality: N/A;   COLONOSCOPY WITH PROPOFOL  N/A 10/05/2016   Procedure: COLONOSCOPY WITH PROPOFOL ;  Surgeon: Viktoria Lamar DASEN, MD;  Location: [facility] ENDOSCOPY;  Service: Endoscopy;  Laterality: N/A;   debridement sternum     ELECTROPHYSIOLOGIC STUDY N/A 05/05/2015   Procedure: Cardioversion;  Surgeon: Denyse DELENA Bathe, MD;  Location: [facility] ORS;  Service: Cardiovascular;  Laterality: N/A;   ELECTROPHYSIOLOGIC STUDY N/A 05/17/2015   Procedure: CARDIOVERSION;  Surgeon: Denyse DELENA Bathe, MD;  Location: [facility] ORS;  Service: Cardiovascular;  Laterality: N/A;   ESOPHAGOGASTRODUODENOSCOPY (EGD) WITH PROPOFOL  N/A 10/05/2016   Procedure: ESOPHAGOGASTRODUODENOSCOPY (  EGD) WITH PROPOFOL ;  Surgeon: Viktoria Lamar DASEN, MD;  Location: [facility] ENDOSCOPY;  Service: Endoscopy;  Laterality: N/A;   GREATER OMENTAL FLAP CLOSURE     HERNIA REPAIR     X3   VALVE REPLACEMENT     VENTRAL HERNIA REPAIR N/A 12/01/2016   Procedure: HERNIA REPAIR VENTRAL ADULT with mesh;  Surgeon: Jordis Laneta FALCON, MD;  Location: [facility] ORS;  Service: General;  Laterality: N/A;     Allergies  Allergen Reactions   Lovenox  [Enoxaparin  Sodium] Itching   Enoxaparin  Anxiety,  Dermatitis, Hives, Itching, Nausea Only and Swelling    Family History  Problem Relation Age of Onset   Hypertension Mother    Hypertension Maternal Grandmother    Diabetes Maternal Grandmother     Prior to Admission medications   Medication Sig Start Date End Date Taking? Authorizing Provider  acetaminophen  (TYLENOL ) 325 MG tablet Take 650 mg by mouth every 4 (four) hours as needed for moderate pain.    [provider]  amiodarone  (PACERONE ) 200 MG tablet TAKE 1 TABLET BY MOUTH TWICE A DAY 11/28/23   Fernand Denyse LABOR, MD  budesonide -formoterol  (SYMBICORT ) 80-4.5 MCG/ACT inhaler Inhale 2 puffs into the lungs 2 (two) times daily. 08/27/23 12/09/23  Albina GORMAN Dine, MD  celecoxib  (CELEBREX ) 400 MG capsule TAKE 1 CAPSULE BY MOUTH DAILY. 12/13/23   Albina GORMAN Dine, MD  cetirizine  (ZYRTEC  ALLERGY) 10 MG tablet Take 1 tablet (10 mg total) by mouth daily. 08/27/23 11/25/23  Albina GORMAN Dine, MD  cyanocobalamin  1000 MCG tablet Take 1,000 mcg by mouth daily. Patient not taking: Reported on 05/04/2022    [provider]  fluticasone  (FLONASE ) 50 MCG/ACT nasal spray Place 1 spray into both nostrils daily. 08/27/23 11/25/23  Albina GORMAN Dine, MD  ibandronate  (BONIVA ) 150 MG tablet Take 1 tablet (150 mg total) by mouth every 30 (thirty) days. Take in the morning with a full glass of water, on an empty stomach, and do not take anything else by mouth or lie down for the next 30 min. 02/13/23 02/13/24  Tejan-Sie, S Ahmed, MD  Iron, Ferrous Sulfate , 142 (45 Fe) MG TBCR Take 1 tablet by mouth daily. Patient not taking: Reported on 05/04/2022    [provider]  lisinopril  (ZESTRIL ) 20 MG tablet TAKE 1 TABLET BY MOUTH EVERY DAY 09/23/23   Albina GORMAN Dine, MD  LORazepam  (ATIVAN ) 1 MG tablet Take 1 tablet (1 mg total) by mouth 2 (two) times daily. 11/22/23   Albina GORMAN Dine, MD  magnesium  oxide (MAG-OX) 400 MG tablet TAKE 2 TABLETS BY MOUTH TWICE A DAY 05/27/23   Albina GORMAN Dine, MD   Magnesium  Oxide 400 (240 Mg) MG TABS Take 1 tablet (400 mg total) by mouth every morning. 05/16/17   Josette Ade, MD  metoprolol  succinate (TOPROL -XL) 50 MG 24 hr tablet TAKE 1 TABLET BY MOUTH EVERY DAY 12/20/23   Fernand Denyse LABOR, MD  ondansetron  (ZOFRAN ) 4 MG tablet TAKE 1 TABLET BY MOUTH TWICE A DAY 11/26/23   Albina GORMAN Dine, MD  ondansetron  (ZOFRAN -ODT) 4 MG disintegrating tablet Take 1 tablet (4 mg total) by mouth every 8 (eight) hours as needed. 09/02/23   Albina GORMAN Dine, MD  oxyCODONE  (ROXICODONE ) 5 MG immediate release tablet Take 1 tablet (5 mg total) by mouth every 6 (six) hours as needed for severe pain (pain score 7-10). 11/17/23   Ponnala, Shruthi, MD  pioglitazone  (ACTOS ) 45 MG tablet Take 45 mg by mouth daily. Patient not taking: Reported on 05/04/2022  [provider]  rosuvastatin  (CRESTOR ) 20 MG tablet TAKE 1 TABLET BY MOUTH EVERY DAY IN THE EVENING 12/30/23   Fernand Denyse LABOR, MD  rosuvastatin  (CRESTOR ) 20 MG tablet Take 1 tablet (20 mg total) by mouth daily. 12/30/23 06/27/24  Albina GORMAN Dine, MD  tirzepatide  (MOUNJARO ) 5 MG/0.5ML Pen INJECT 5 MG SUBCUTANEOUSLY WEEKLY 11/29/23 02/27/24  Albina GORMAN Dine, MD  warfarin (COUMADIN ) 2 MG tablet TAKE 1 TABLET BY MOUTH DAILY OR AS DIRECTED 09/03/23   Fernand Denyse LABOR, MD    Social History:  reports that he has quit smoking. His smoking use included cigarettes. He has a 56 pack-year smoking history. He has never used smokeless tobacco. He reports that he does not drink alcohol and does not use drugs. Lives with his mother Tobacco- Denies use. EtOH- Denies use.  Illicit drug use- denies use.  IADLs/ADLs- can perform independently at baseline    Physical Exam: Vitals:   01/06/24 1315 01/06/24 1330 01/06/24 1338 01/06/24 1405  BP: (!) 86/53 (!) 88/49  (!) 92/53  Pulse:      Resp: 14 18  17   Temp:   98 F (36.7 C)   TempSrc:      SpO2:      Weight:      Height:       Gen: No acute distress HENT: NCAT, dry  MM CV: Irregularly irregular, diminished pedal pulses Resp: CTAB Abd: No TTP, protuberant abdomen MSK: No symmetry, no lower extremity edema Skin: Multiple surgical scars noted, no signs of skin infection Neuro: Alert and oriented x 4 Psych: Pleasant mood   Labs on Admission: I have personally reviewed following labs and imaging studies  CBC: Recent Labs  Lab 01/06/24 0959  WBC 16.0*  HGB 12.9*  HCT 40.5  MCV 88.8  PLT 173   Basic Metabolic Panel: Recent Labs  Lab 01/06/24 0959  NA 134*  K 4.5  CL 102  CO2 19*  GLUCOSE 135*  BUN 25*  CREATININE 1.44*  CALCIUM  9.7  MG 1.8   GFR: Estimated Creatinine Clearance: 97.8 mL/min (A) (by C-G formula based on SCr of 1.44 mg/dL (H)). Liver Function Tests: Recent Labs  Lab 01/06/24 0959  AST 20  ALT 14  ALKPHOS 68  BILITOT 1.9*  PROT 7.5  ALBUMIN 3.3*   Recent Labs  Lab 01/06/24 0959  LIPASE 24   No results for input(s): AMMONIA in the last 168 hours. Coagulation Profile: Recent Labs  Lab 01/06/24 1041  INR 2.3*   Cardiac Enzymes: Recent Labs  Lab 01/06/24 0959  CKTOTAL 68  TROPONINIHS 110*   BNP (last 3 results) Recent Labs    11/15/23 0904 01/06/24 0959  BNP 161.7* 166.4*   HbA1C: No results for input(s): HGBA1C in the last 72 hours. CBG: No results for input(s): GLUCAP in the last 168 hours. Lipid Profile: No results for input(s): CHOL, HDL, LDLCALC, TRIG, CHOLHDL, LDLDIRECT in the last 72 hours. Thyroid  Function Tests: Recent Labs    01/06/24 0959  TSH 2.315   Anemia Panel: No results for input(s): VITAMINB12, FOLATE, FERRITIN, TIBC, IRON, RETICCTPCT in the last 72 hours. Urine analysis:    Component Value Date/Time   COLORURINE AMBER (A) 01/06/2024 1121   APPEARANCEUR CLOUDY (A) 01/06/2024 1121   LABSPEC 1.029 01/06/2024 1121   PHURINE 5.0 01/06/2024 1121   GLUCOSEU NEGATIVE 01/06/2024 1121   HGBUR MODERATE (A) 01/06/2024 1121   BILIRUBINUR  NEGATIVE 01/06/2024 1121   KETONESUR NEGATIVE 01/06/2024 1121   PROTEINUR  100 (A) 01/06/2024 1121   NITRITE NEGATIVE 01/06/2024 1121   LEUKOCYTESUR MODERATE (A) 01/06/2024 1121    Radiological Exams on Admission: I have personally reviewed images DG Chest 2 View Result Date: 01/06/2024 CLINICAL DATA:  Shortness of breath and back pain. EXAM: CHEST - 2 VIEW COMPARISON:  11/15/2023 FINDINGS: Low lung volumes. Interstitial markings are diffusely coarsened with chronic features. The cardio pericardial silhouette is enlarged. Status post cardiac valve replacement. Telemetry leads overlie the chest. IMPRESSION: Low volume film without acute cardiopulmonary findings. Electronically Signed   By: Camellia Candle M.D.   On: 01/06/2024 11:26    EKG: My personal interpretation of EKG shows: atrial fibrillation without any acute ST changes.   Assessment/Plan Principal Problem:   Sepsis secondary to UTI [facility]) Active Problems:   Paroxysmal atrial fibrillation (HCC)   Type 2 diabetes mellitus without complications (HCC)   Essential hypertension   H/O aortic valve replacement   Chronic diastolic CHF (congestive heart failure) (HCC)   Urosepsis.Patient with some symptoms of malodorous urine and increased urination with urinalysis concerning for UTI.  CBC with leukocytosis of 16K, patient hypotensive on arrival with some improvement of IVF.  Urine culture and blood culture pending.  Will continue ceftriaxone  1 g daily for UTI.  Monitor cultures, monitor fever curve and trend leukocytosis.  Chronic problems Hypertension: Holding home meds, patient hypotensive on arrival likely due to above problem. Hyperlipidemia: Continue home meds Class III obesity: Patient is on Mounjaro , discussed lifestyle modifications. PAF: Continue home warfarin and amiodarone  IIDDM: On Mounjaro , will start SSI here.   VTE prophylaxis:  Coumadin   Diet: Regular Code Status:  Full Code Telemetry:  Admission status: Inpatient,  Progressive Patient is from: Home Anticipated d/c is to: Home Anticipated d/c is in: 3-4 days   Family Communication: Updated at bedside  Consults called: None   Severity of Illness: The appropriate patient status for this patient is INPATIENT. Inpatient status is judged to be reasonable and necessary in order to provide the required intensity of service to ensure the patient's safety. The patient's presenting symptoms, physical exam findings, and initial radiographic and laboratory data in the context of their chronic comorbidities is felt to place them at high risk for further clinical deterioration. Furthermore, it is not anticipated that the patient will be medically stable for discharge from the hospital within 2 midnights of admission.   * I certify that at the point of admission it is my clinical judgment that the patient will require inpatient hospital care spanning beyond 2 midnights from the point of admission due to high intensity of service, high risk for further deterioration and high frequency of surveillance required.DEWAINE Morene Bathe, MD Jolynn DEL. [facility]

## 2024-01-06 NOTE — ED Provider Notes (Signed)
 [facility] Provider Note    Event Date/Time   First MD Initiated Contact with Patient 01/06/24 1001     (approximate)   History   Shortness of Breath   HPI  Russell Ray is a 53 y.o. male with bicuspid aortic valve status post valve replacement, paroxysmal A-fib on Coumadin , hypertension, hyperlipidemia, prior gastric bypass, diabetes who comes in with concerns for urinary symptoms.  He did well after coming out of the hospital but some weeks later he started to have the symptoms again of some darkened urine, lightheadedness and so he started taking Keflex  as there was a refill on it.  He reports that the symptoms did get better again up until 2 or 3 days ago he started having the same symptoms.  He reports feeling dark urine and having some vague back pain that he took a muscle relaxer and lorazepam  for Saturday night.  He reports that this morning he noted to have feeling like he had a fever with significant shaking.  However he did not take a temperature but did take Tylenol  around 4 to 5 AM.  He reported noticing that his blood pressure was low this morning and these were similar symptoms when he was admitted before for UTI therefore he presented again today.  He denies taking his lisinopril  this morning.  Contrary to triage note he does not really report feeling short of breath it was more just like a lightheadedness, weakness similar to when he was admitted previously.  He reports that his back pain is just a little bit in the mid back.  He reports just being uncomfortable sitting in this bed.  I reviewed the note where patient was admitted and discharged from 10/1918 25 until 9/21 2025.  Patient had low blood pressures requiring IV fluids.  CT imaging without evidence of sepsis.  He was on ceftriaxone  and discharged on Keflex  and given urine culture did grow E. coli.  During this his creatinine was up to 1.97.   Physical Exam   Triage Vital Signs: ED Triage Vitals   Encounter Vitals Group     BP 01/06/24 0957 (!) 69/38     Girls Systolic BP Percentile --      Girls Diastolic BP Percentile --      Boys Systolic BP Percentile --      Boys Diastolic BP Percentile --      Pulse Rate 01/06/24 0957 77     Resp 01/06/24 0957 18     Temp 01/06/24 0957 (!) 97.5 F (36.4 C)     Temp Source 01/06/24 0957 Oral     SpO2 01/06/24 0957 97 %     Weight 01/06/24 0955 (!) 385 lb 12.9 oz (175 kg)     Height 01/06/24 0955 5' 11 (1.803 m)     Head Circumference --      Peak Flow --      Pain Score 01/06/24 0955 2     Pain Loc --      Pain Education --      Exclude from Growth Chart --     Most recent vital signs: Vitals:   01/06/24 1005 01/06/24 1006  BP: (!) 80/41   Pulse:  84  Resp:  16  Temp:    SpO2:  100%     General: Awake, no distress.  CV:  Good peripheral perfusion.  Resp:  Normal effort.  Abd:  No distention.  Soft and nontender Other:  No swelling  legs No CVA tenderness no flank pain tenderness.  ED Results / Procedures / Treatments   Labs (all labs ordered are listed, but only abnormal results are displayed) Labs Reviewed  CBC - Abnormal; Notable for the following components:      Result Value   WBC 16.0 (*)    Hemoglobin 12.9 (*)    All other components within normal limits  BASIC METABOLIC PANEL WITH GFR  BRAIN NATRIURETIC PEPTIDE  HEPATIC FUNCTION PANEL  LIPASE, BLOOD  URINALYSIS, ROUTINE W REFLEX MICROSCOPIC  PROTIME-INR  APTT  TROPONIN I (HIGH SENSITIVITY)     EKG  My interpretation of EKG:  Atrial fibrillation with a rate of 85 without any ST elevation or T wave inversions, normal intervals  RADIOLOGY none   PROCEDURES:  Critical Care performed: Yes, see critical care procedure note(s)  .1-3 Lead EKG Interpretation  Performed by: Ernest Ronal BRAVO, MD Authorized by: Ernest Ronal BRAVO, MD     Interpretation: normal     ECG rate:  60   ECG rate assessment: normal     Rhythm: sinus rhythm     Ectopy: none      Conduction: normal   .Critical Care  Performed by: Ernest Ronal BRAVO, MD Authorized by: Ernest Ronal BRAVO, MD   Critical care provider statement:    Critical care time (minutes):  30   Critical care was necessary to treat or prevent imminent or life-threatening deterioration of the following conditions: hypotension, severe infection.   Critical care was time spent personally by me on the following activities:  Development of treatment plan with patient or surrogate, discussions with consultants, evaluation of patient's response to treatment, examination of patient, ordering and review of laboratory studies, ordering and review of radiographic studies, ordering and performing treatments and interventions, pulse oximetry, re-evaluation of patient's condition and review of old charts    MEDICATIONS ORDERED IN ED: Medications  sodium chloride  0.9 % bolus 1,000 mL (has no administration in time range)  cefTRIAXone  (ROCEPHIN ) 2 g in sodium chloride  0.9 % 100 mL IVPB (has no administration in time range)  sodium chloride  0.9 % bolus 1,000 mL (1,000 mLs Intravenous New Bag/Given 01/06/24 1038)  sodium chloride  0.9 % bolus 1,000 mL (1,000 mLs Intravenous New Bag/Given 01/06/24 1108)     IMPRESSION / MDM / ASSESSMENT AND PLAN / ED COURSE  I reviewed the triage vital signs and the nursing notes.   Patient's presentation is most consistent with acute presentation with potential threat to life or bodily function.   Patient comes in hypotensive patient was ordered fluids and sepsis workup was ordered.  Workup was done evaluate for AKI, infection, UTI.  Doubt kidney stone as he has had recent CT imaging without evidence of kidney stones and his pain in his back is not flank pain.  Is just low mid pain.  Cbc shows elevated white count BMP AKI of 1.44 Urine looks positive for UTI will send for culture.  Will start patient on ceftriaxone .  Repeat blood pressures are coming up with maps of 65 he continues to  mentate well.  Patient will be ordered additional liter of fluid therefore a total of 3 L for ideal body weight.  Will discuss to hospital team for admission given low blood pressures, UTI.   The patient is on the cardiac monitor to evaluate for evidence of arrhythmia and/or significant heart rate changes.      FINAL CLINICAL IMPRESSION(S) / ED DIAGNOSES   Final diagnoses:  AKI (acute  kidney injury)  Acute cystitis without hematuria  Hypotension due to hypovolemia     Rx / DC Orders   ED Discharge Orders     None        Note:  This document was prepared using Dragon voice recognition software and may include unintentional dictation errors.   Ernest Ronal BRAVO, MD 01/06/24 [phone]

## 2024-01-06 NOTE — Consult Note (Addendum)
 Pharmacy Consult Note - Anticoagulation  Pharmacy Consult for Warfarin Indication: atrial fibrillation and MAVR  Patient Measurements: Height: 5' 11 (180.3 cm) Weight: (!) 175 kg (385 lb 12.9 oz) IBW/kg (Calculated) : 75.3 HEPARIN  DW (KG): 118.4  Vital Signs: Temp: 98 F (36.7 C) (11/10 1338) Temp Source: Oral (11/10 0957) BP: 92/53 (11/10 1405) Pulse Rate: 37 (11/10 1230)  Recent Labs    01/06/24 0959 01/06/24 1041  HGB 12.9*  --   HCT 40.5  --   PLT 173  --   APTT  --  56*  LABPROT  --  26.5*  INR  --  2.3*  CREATININE 1.44*  --   CKTOTAL 68  --   TROPONINIHS 110*  --     Estimated Creatinine Clearance: 97.8 mL/min (A) (by C-G formula based on SCr of 1.44 mg/dL (H)).  Past Medical History: Past Medical History:  Diagnosis Date   Anxiety    Aortic valve stenosis    Atrial fibrillation (HCC)    CHF (congestive heart failure) (HCC)    Depression    Diabetes mellitus without complication (HCC)    Hypertension     Medications:  (Not in a hospital admission)  Scheduled:   sodium chloride  flush  3 mL Intravenous Q12H   Infusions:   albumin human     PRN: acetaminophen  **OR** acetaminophen , senna-docusate  Baseline anticoagulation labs: Recent Labs    01/06/24 0959 01/06/24 1041  APTT  --  56*  INR  --  2.3*  HGB 12.9*  --   PLT 173  --      Assessment: Patient is a 53 y.o. male admitted on 01/06/2024 with PMH bicuspid aortic  valve replacement, paroxysmal A-fib on Coumadin , HTN HLD, and DM is presenting with a complicated UTI.  INR on admission 2.3. Pharmacy has been consulted to manage warfarin dosing.  Home dose: 1mg  and 2mg  daily on alternating days. Last dose: 1 mg on 11/10. Patient monitors INR at home with goal of 2.5-3.5 Concurrent anticoagulation: none Potential drug interactions: amiodarone  Current diet: Thin  Date INR Warfarin Dose  11/10 2.3 1 mg taken PTA (plan to give additional 1 mg today)         Goal(s) of therapy: Heparin   level 0.3 - 0.7 units/mL INR 2.5 - 3.5 (patient has mechanical AV replacement and Afib) Monitor platelets by anticoagulation protocol: Yes  Plan: INR is Subtherapeutic on admission Will give and additional 1 mg dose today then tentatively resume home dose of 1 mg and 2 mg on alternating days tomorrow. Monitor INR and adjust dose accordingly for a goal INR of 2.5-3.5  Annabella LOISE Banks, PharmD Clinical Pharmacist 01/06/2024 3:03 PM

## 2024-01-06 NOTE — ED Notes (Signed)
 Attempted to take pt back for triage but stating he needed to go outside to vehicle.

## 2024-01-06 NOTE — ED Triage Notes (Signed)
 Patient to ED via POV for SOB, back pain, and dark colored urine. States ongoing x2 days. Dc'd on 10/21- went home on antibiotics for AKI. States BP at home was 80/50. Aox4, ambulatory to triage.

## 2024-01-06 NOTE — Consult Note (Signed)
 CODE SEPSIS - PHARMACY COMMUNICATION  **Broad Spectrum Antibiotics should be administered within 1 hour of Sepsis diagnosis**  Time Code Sepsis Called/Page Received: 1230  Antibiotics Ordered: Rocephin  2G x1  Time of 1st antibiotic administration: 1330  Additional action taken by pharmacy: Messaged RN Steward, informed code sepsis canceled  Annabella LOISE Banks ,PharmD Clinical Pharmacist  01/06/2024  12:43 PM

## 2024-01-07 DIAGNOSIS — A419 Sepsis, unspecified organism: Principal | ICD-10-CM

## 2024-01-07 DIAGNOSIS — N390 Urinary tract infection, site not specified: Secondary | ICD-10-CM | POA: Diagnosis not present

## 2024-01-07 LAB — COMPREHENSIVE METABOLIC PANEL WITH GFR
ALT: 12 U/L (ref 0–44)
AST: 18 U/L (ref 15–41)
Albumin: 3.1 g/dL — ABNORMAL LOW (ref 3.5–5.0)
Alkaline Phosphatase: 57 U/L (ref 38–126)
Anion gap: 10 (ref 5–15)
BUN: 24 mg/dL — ABNORMAL HIGH (ref 6–20)
CO2: 20 mmol/L — ABNORMAL LOW (ref 22–32)
Calcium: 9.1 mg/dL (ref 8.9–10.3)
Chloride: 104 mmol/L (ref 98–111)
Creatinine, Ser: 0.87 mg/dL (ref 0.61–1.24)
GFR, Estimated: >60 mL/min (ref >60)
Glucose, Bld: 93 mg/dL (ref 70–99)
Potassium: 3.8 mmol/L (ref 3.5–5.1)
Sodium: 134 mmol/L — ABNORMAL LOW (ref 135–145)
Total Bilirubin: 1.1 mg/dL (ref 0.0–1.2)
Total Protein: 6.1 g/dL — ABNORMAL LOW (ref 6.5–8.1)

## 2024-01-07 LAB — CBC
HCT: 30.3 % — ABNORMAL LOW (ref 39.0–52.0)
Hemoglobin: 9.6 g/dL — ABNORMAL LOW (ref 13.0–17.0)
MCH: 28.1 pg (ref 26.0–34.0)
MCHC: 31.7 g/dL (ref 30.0–36.0)
MCV: 88.6 fL (ref 80.0–100.0)
Platelets: 120 K/uL — ABNORMAL LOW (ref 150–400)
RBC: 3.42 MIL/uL — ABNORMAL LOW (ref 4.22–5.81)
RDW: 15.5 % (ref 11.5–15.5)
WBC: 5.5 K/uL (ref 4.0–10.5)
nRBC: 0 % (ref 0.0–0.2)

## 2024-01-07 LAB — PROTIME-INR
INR: 2.4 — ABNORMAL HIGH (ref 0.8–1.2)
Prothrombin Time: 27.4 s — ABNORMAL HIGH (ref 11.4–15.2)

## 2024-01-07 MED ORDER — CEPHALEXIN 500 MG PO CAPS: 500.0000 mg | ORAL_CAPSULE | Freq: Four times a day (QID) | ORAL | 0 refills | Status: AC

## 2024-01-07 MED ORDER — AMIODARONE HCL 200 MG PO TABS
200.0000 mg | ORAL_TABLET | Freq: Once | ORAL | Status: AC
  Administered 2024-01-07: 200 mg via ORAL
  Filled 2024-01-07: qty 1

## 2024-01-07 MED ORDER — AMIODARONE HCL 200 MG PO TABS: 200.0000 mg | ORAL_TABLET | Freq: Two times a day (BID) | ORAL | Status: DC

## 2024-01-07 MED ORDER — SODIUM CHLORIDE 0.9 % IV SOLN
2.0000 g | INTRAVENOUS | Status: DC
  Filled 2024-01-07: qty 20

## 2024-01-07 MED ORDER — LACTATED RINGERS IV SOLN: INTRAVENOUS | Status: DC

## 2024-01-07 MED ORDER — ROSUVASTATIN CALCIUM 10 MG PO TABS
20.0000 mg | ORAL_TABLET | Freq: Every day | ORAL | Status: DC
  Administered 2024-01-07: 20 mg via ORAL
  Filled 2024-01-07: qty 2

## 2024-01-07 MED ORDER — WARFARIN SODIUM 2 MG PO TABS
2.0000 mg | ORAL_TABLET | Freq: Once | ORAL | Status: DC
  Filled 2024-01-07: qty 1

## 2024-01-07 MED ORDER — INSULIN ASPART 100 UNIT/ML IJ SOLN: 0.0000 [IU] | Freq: Three times a day (TID) | INTRAMUSCULAR | Status: DC

## 2024-01-07 MED ORDER — SODIUM CHLORIDE 0.9 % IV SOLN
2.0000 g | Freq: Once | INTRAVENOUS | Status: AC
  Administered 2024-01-07: 2 g via INTRAVENOUS
  Filled 2024-01-07: qty 20

## 2024-01-07 NOTE — Progress Notes (Signed)
 Patient encouraged to call staff to assist to and from the bathroom. Patient understands that he could fall and seriously hurt himself or death. Patient understands and states he does not feel light headed, however he states he will call for assistance if he feels he needs additional help.

## 2024-01-07 NOTE — Consult Note (Addendum)
 PHARMACY - ANTICOAGULATION CONSULT NOTE  Pharmacy Consult for Warfarin Indication: atrial fibrillation and MAVR  Allergies  Allergen Reactions   Lovenox  [Enoxaparin  Sodium] Itching   Enoxaparin  Anxiety, Dermatitis, Hives, Itching, Nausea Only and Swelling    Patient Measurements: Height: 5' 11 (180.3 cm) Weight: (!) 176.3 kg (388 lb 10.7 oz) IBW/kg (Calculated) : 75.3 HEPARIN  DW (KG): 118.4  Vital Signs: Temp: 99 F (37.2 C) (11/11 0755) Temp Source: Oral (11/11 0351) BP: 103/61 (11/11 0755) Pulse Rate: 77 (11/11 0755)  Labs: Recent Labs    01/06/24 0959 01/06/24 1041 01/07/24 0414  HGB 12.9*  --  9.6*  HCT 40.5  --  30.3*  PLT 173  --  120*  APTT  --  56*  --   LABPROT  --  26.5* 27.4*  INR  --  2.3* 2.4*  CREATININE 1.44*  --  0.87  CKTOTAL 68  --   --   TROPONINIHS 110*  --   --     Estimated Creatinine Clearance: 162.5 mL/min (by C-G formula based on SCr of 0.87 mg/dL).   Medical History: Past Medical History:  Diagnosis Date   Anxiety    Aortic valve stenosis    Atrial fibrillation (HCC)    CHF (congestive heart failure) (HCC)    Depression    Diabetes mellitus without complication (HCC)    Hypertension     Medications:  Scheduled:   amiodarone   200 mg Oral BID   rosuvastatin   20 mg Oral Daily   sodium chloride  flush  3 mL Intravenous Q12H   Warfarin - Pharmacist Dosing Inpatient   Does not apply q1600   Infusions:   cefTRIAXone  (ROCEPHIN )  IV     lactated ringers  150 mL/hr (01/07/24 0948)    Assessment: Patient is a 53 y.o. male admitted 01/06/24 for suspected sepsis secondary to recurrent UTI. Started on ceftriaxone  with pending blood and urine cultures. Significant PMH for bicuspid aortic valve replacement, paroxsymal A-fib on warfarin, HTN, HLD, DM, and prior gastric bypass. INR on admission was 2.3 (subtherapeutic). Hemoglobin trending down (12.9 on 11/10 > 9.6 today). No s/sx of bleeding noted.   Home warfarin dose: 1 mg and 2 mg on  alternating days. Last dose was 2 mg total on 11/10.  Potential drug interaction with amiodarone  noted, patient is stable on PTA amio/warfarin doses. Ceftriaxone  can also increase INR.    Goal of Therapy:  INR 2.5-3.5 (patient has mechanical aortic valve replacement and A-Fib) Monitor platelets by anticoagulation protocol: Yes  CHADSVASc score: 4 (CHF, HTN, mechanical valve, DM)   Date INR Warfarin Dose  11/10 2.3 1 mg taken PTA  1 mg given @1833   11/11 2.4 Give 2 mg today    Plan:  - INR is trending up but still subtherapeutic - Give 2 mg warfarin today since INR is still subtherapeutic   - Monitor INR daily and adjust accordingly for goal INR of 2.5-3.5  - Hgb is trending down, possibly due to hemodilution and albumin, continue to monitor   Signe Platt, PharmD Candidate 01/07/2024,9:59 AM

## 2024-01-07 NOTE — TOC CM/SW Note (Signed)
 Transition of Care [facility]) - Inpatient Brief Assessment   Patient Details  Name: Russell Ray MRN: [id number] Date of Birth: [birth year]  [facility]) CM/SW Contact:    Lauraine [name], LCSW Phone Number: 01/07/2024, 2:36 PM   Clinical Narrative: Patient has orders to discharge home today. Chart reviewed. No TOC needs identified. CSW signing off.  Transition of Care Asessment: Insurance and Status: Insurance coverage has been reviewed Patient has primary care physician: Yes Home environment has been reviewed: Apartment Prior level of function:: Not documented Prior/Current Home Services: No current home services Social Drivers of Health Review: SDOH reviewed no interventions necessary Readmission risk has been reviewed: Yes Transition of care needs: no transition of care needs at this time

## 2024-01-07 NOTE — Care Management Obs Status (Signed)
 MEDICARE OBSERVATION STATUS NOTIFICATION   Patient Details  Name: Russell Ray MRN: [id number] Date of Birth: [birth year]   Medicare Observation Status Notification Given:  Yes    Lauraine [name], LCSW 01/07/2024, 2:35 PM

## 2024-01-07 NOTE — Progress Notes (Signed)
 Patient has an order for continuous pulse ox. Patient refused and educated. Patient states he knows he has Afib, and is aware he becomes light headed at times. Patient agrees to keep the tele box on.

## 2024-01-07 NOTE — Discharge Summary (Signed)
 Physician Discharge Summary   Russell Ray  male DOB: [birth year]  FMW:[id number]  PCP: Albina GORMAN Dine, MD  Admit date: 01/06/2024 Discharge date: 01/07/2024  Admitted From: home Disposition:  home CODE STATUS: Full code  Discharge Instructions     Ambulatory referral to Urology   Complete by: As directed       Hospital Course:  For full details, please see H&P, progress notes, consult notes and ancillary notes.  Briefly,  Russell Ray is a 53 y.o. 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    He reported having 3 UTIs since September of this year.  He does not report any history of UTIs apart from this.  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  Afterwards he tries to clean the area and he has been doing this for a long time.  He has been seen by surgery to correct this issue but they recommend he get some weight loss before this can be done.    Bacteruria  UTI, ruled out --Pt had no urinary symptoms.  Pt seemed to attribute hypotension to UTI, although hypotension very well could be caused by too much BP meds.  Pt was started on ceftriaxone  on presentation, and requested Keflex  at discharge, pt was therefore discharged on 5 days of Keflex  to finish a course.   --Home Lisinopril  and metop d/c'ed due to hypotension.  Hypertension, not currently active Hypotension --systolic recorded as 60's-80's on presentation.  Received 3L NS bolus with improvement.  Prior to discharge, systolic 100's-110's without any antihypertensives, so home Lisinopril  and metop d/c'ed.  Hyperlipidemia:  Continue home statin  Class III obesity:  --cont Mounjaro   PAF:  Continue  home warfarin and amiodarone   DM2:  --last A1c 5.8 --cont Mounjaro    Unless noted above, medications under STOP list are ones pt was not taking PTA.  Discharge Diagnoses:  Principal Problem:   Sepsis secondary to UTI [facility]) Active Problems:   Paroxysmal atrial fibrillation (HCC)   Type 2 diabetes mellitus without complications (HCC)   Essential hypertension   H/O aortic valve replacement   Chronic diastolic CHF (congestive heart failure) (HCC)   Complicated UTI (urinary tract infection)   30 Day Unplanned Readmission Risk Score    Flowsheet Row ED to Hosp-Admission (Current) from 01/06/2024 in [facility]  30 Day Unplanned Readmission Risk Score (%) 19.32 Filed at 01/07/2024 1200    This score is the patient's risk of an unplanned readmission within 30 days of being discharged (0 -100%). The score is based on dignosis, age, lab data, medications, orders, and past utilization.   Low:  0-14.9   Medium: 15-21.9   High: 22-29.9   Extreme: 30 and above         Discharge Instructions:  Allergies as of 01/07/2024       Reactions   Lovenox  [enoxaparin  Sodium] Itching   Enoxaparin  Anxiety, Dermatitis, Hives, Itching, Nausea Only, Swelling        Medication List     STOP taking these medications    cyanocobalamin  1000 MCG tablet   Iron (Ferrous Sulfate ) 142 (45 Fe) MG Tbcr   lisinopril  20 MG tablet Commonly  known as: ZESTRIL    magnesium  oxide 400 (240 Mg) MG tablet Commonly known as: MAG-OX   metoprolol  succinate 50 MG 24 hr tablet Commonly known as: TOPROL -XL   ondansetron  4 MG disintegrating tablet Commonly known as: ZOFRAN -ODT   oxyCODONE  5 MG immediate release tablet Commonly known as: Roxicodone    pioglitazone  45 MG tablet Commonly known as: ACTOS        TAKE these medications    acetaminophen  325 MG tablet Commonly known as: TYLENOL  Take 650 mg by mouth every 4 (four) hours as needed for moderate pain.   amiodarone  200  MG tablet Commonly known as: PACERONE  TAKE 1 TABLET BY MOUTH TWICE A DAY   budesonide -formoterol  80-4.5 MCG/ACT inhaler Commonly known as: SYMBICORT  Inhale 2 puffs into the lungs 2 (two) times daily.   celecoxib  400 MG capsule Commonly known as: CELEBREX  TAKE 1 CAPSULE BY MOUTH DAILY.   cephALEXin  500 MG capsule Commonly known as: KEFLEX  Take 1 capsule (500 mg total) by mouth 4 (four) times daily for 5 days. Start taking on: January 08, 2024   cetirizine  10 MG tablet Commonly known as: ZyrTEC  Allergy Take 1 tablet (10 mg total) by mouth daily.   fluticasone  50 MCG/ACT nasal spray Commonly known as: FLONASE  Place 1 spray into both nostrils daily.   ibandronate  150 MG tablet Commonly known as: Boniva  Take 1 tablet (150 mg total) by mouth every 30 (thirty) days. Take in the morning with a full glass of water, on an empty stomach, and do not take anything else by mouth or lie down for the next 30 min.   LORazepam  1 MG tablet Commonly known as: ATIVAN  Take 1 tablet (1 mg total) by mouth 2 (two) times daily.   magnesium  oxide 400 MG tablet Commonly known as: MAG-OX TAKE 2 TABLETS BY MOUTH TWICE A DAY What changed: how much to take   Mounjaro  5 MG/0.5ML Pen Generic drug: tirzepatide  INJECT 5 MG SUBCUTANEOUSLY WEEKLY   ondansetron  4 MG tablet Commonly known as: ZOFRAN  TAKE 1 TABLET BY MOUTH TWICE A DAY   rosuvastatin  20 MG tablet Commonly known as: CRESTOR  TAKE 1 TABLET BY MOUTH EVERY DAY IN THE EVENING   warfarin 2 MG tablet Commonly known as: COUMADIN  TAKE 1 TABLET BY MOUTH DAILY OR AS DIRECTED         Follow-up Information     Albina GORMAN Dine, MD Follow up.   Specialty: Internal Medicine Contact information: [address] [place] [address] [phone]         Francisca Redell BROCKS, MD Follow up.   Specialty: Urology Why: As needed Contact information: [address] [place] [address] [phone]                  Allergies  Allergen Reactions   Lovenox  [Enoxaparin  Sodium] Itching   Enoxaparin  Anxiety, Dermatitis, Hives, Itching, Nausea Only and Swelling     The results of significant diagnostics from this hospitalization (including imaging, microbiology, ancillary and laboratory) are listed below for reference.   Consultations:   Procedures/Studies: DG Chest 2 View Result Date: 01/06/2024 CLINICAL DATA:  Shortness of breath and back pain. EXAM: CHEST - 2 VIEW COMPARISON:  11/15/2023 FINDINGS: Low lung volumes. Interstitial markings are diffusely coarsened with chronic features. The cardio pericardial silhouette is enlarged. Status post cardiac valve replacement. Telemetry leads overlie the chest. IMPRESSION: Low volume film without acute cardiopulmonary findings. Electronically Signed   By: Camellia Candle M.D.   On: 01/06/2024 11:26  Labs: BNP (last 3 results) Recent Labs    11/15/23 0904 01/06/24 0959  BNP 161.7* 166.4*   Basic Metabolic Panel: Recent Labs  Lab 01/06/24 0959 01/07/24 0414  NA 134* 134*  K 4.5 3.8  CL 102 104  CO2 19* 20*  GLUCOSE 135* 93  BUN 25* 24*  CREATININE 1.44* 0.87  CALCIUM  9.7 9.1  MG 1.8  --    Liver Function Tests: Recent Labs  Lab 01/06/24 0959 01/07/24 0414  AST 20 18  ALT 14 12  ALKPHOS 68 57  BILITOT 1.9* 1.1  PROT 7.5 6.1*  ALBUMIN 3.3* 3.1*   Recent Labs  Lab 01/06/24 0959  LIPASE 24   No results for input(s): AMMONIA in the last 168 hours. CBC: Recent Labs  Lab 01/06/24 0959 01/07/24 0414  WBC 16.0* 5.5  HGB 12.9* 9.6*  HCT 40.5 30.3*  MCV 88.8 88.6  PLT 173 120*   Cardiac Enzymes: Recent Labs  Lab 01/06/24 0959  CKTOTAL 68   BNP: Invalid input(s): POCBNP CBG: No results for input(s): GLUCAP in the last 168 hours. D-Dimer No results for input(s): DDIMER in the last 72 hours. Hgb A1c No results for input(s): HGBA1C in the last 72 hours. Lipid Profile No results for input(s): CHOL,  HDL, LDLCALC, TRIG, CHOLHDL, LDLDIRECT in the last 72 hours. Thyroid  function studies Recent Labs    01/06/24 0959  TSH 2.315   Anemia work up No results for input(s): VITAMINB12, FOLATE, FERRITIN, TIBC, IRON, RETICCTPCT in the last 72 hours. Urinalysis    Component Value Date/Time   COLORURINE AMBER (A) 01/06/2024 1121   APPEARANCEUR CLOUDY (A) 01/06/2024 1121   LABSPEC 1.029 01/06/2024 1121   PHURINE 5.0 01/06/2024 1121   GLUCOSEU NEGATIVE 01/06/2024 1121   HGBUR MODERATE (A) 01/06/2024 1121   BILIRUBINUR NEGATIVE 01/06/2024 1121   KETONESUR NEGATIVE 01/06/2024 1121   PROTEINUR 100 (A) 01/06/2024 1121   NITRITE NEGATIVE 01/06/2024 1121   LEUKOCYTESUR MODERATE (A) 01/06/2024 1121   Sepsis Labs Recent Labs  Lab 01/06/24 0959 01/07/24 0414  WBC 16.0* 5.5   Microbiology Recent Results (from the past 240 hours)  Blood culture (routine x 2)     Status: None (Preliminary result)   Collection Time: 01/06/24 10:41 AM   Specimen: BLOOD  Result Value Ref Range Status   Specimen Description BLOOD RIGHT ANTECUBITAL  Final   Special Requests   Final    BOTTLES DRAWN AEROBIC AND ANAEROBIC Blood Culture results may not be optimal due to an inadequate volume of blood received in culture bottles   Culture   Final    NO GROWTH < 24 HOURS Performed at [facility], [address], [place], [place] [address]    Report Status PENDING  Incomplete  Blood culture (routine x 2)     Status: None (Preliminary result)   Collection Time: 01/06/24 10:41 AM   Specimen: BLOOD  Result Value Ref Range Status   Specimen Description BLOOD LEFT ANTECUBITAL  Final   Special Requests   Final    BOTTLES DRAWN AEROBIC AND ANAEROBIC Blood Culture results may not be optimal due to an inadequate volume of blood received in culture bottles   Culture   Final    NO GROWTH < 24 HOURS Performed at [facility], [address], [place], [place] [address]    Report  Status PENDING  Incomplete  Urine Culture     Status: Abnormal (Preliminary result)   Collection Time: 01/06/24 11:21 AM  Specimen: Urine, Clean Catch  Result Value Ref Range Status   Specimen Description   Final    URINE, CLEAN CATCH Performed at [facility], [address], [place], [place] [address]    Special Requests   Final    NONE Performed at [facility], [address] [place], [place] [address]    Culture (A)  Final    >=100,000 COLONIES/mL ESCHERICHIA COLI SUSCEPTIBILITIES TO FOLLOW Performed at [facility], [address] [place], [place] [address]    Report Status PENDING  Incomplete     Total time spend on discharging this patient, including the last patient exam, discussing the hospital stay, instructions for ongoing care as it relates to all pertinent caregivers, as well as preparing the medical discharge records, prescriptions, and/or referrals as applicable, is 45 minutes.    Ellouise Haber, MD  [facility] 01/07/2024, 2:10 PM

## 2024-01-07 NOTE — Care Management CC44 (Signed)
 Condition Code 44 Documentation Completed  Patient Details  Name: Russell Ray MRN: [id number] Date of Birth: Aug 17, 1970   Condition Code 44 given:  Yes Patient signature on Condition Code 44 notice:  Yes Documentation of 2 MD's agreement:  Yes Code 44 added to claim:  Yes    Lauraine [name], LCSW 01/07/2024, 2:35 PM

## 2024-01-07 NOTE — Plan of Care (Signed)
  Problem: Health Behavior/Discharge Planning: Goal: Ability to manage health-related needs will improve Outcome: Progressing   Problem: Clinical Measurements: Goal: Ability to maintain clinical measurements within normal limits will improve Outcome: Progressing   Problem: Activity: Goal: Risk for activity intolerance will decrease Outcome: Progressing   Problem: Coping: Goal: Level of anxiety will decrease Outcome: Progressing   Problem: Safety: Goal: Ability to remain free from injury will improve Outcome: Progressing   

## 2024-01-08 LAB — URINE CULTURE: Culture: >=100000 — AB

## 2024-01-10 ENCOUNTER — Other Ambulatory Visit: Payer: Self-pay | Admitting: Internal Medicine

## 2024-01-11 LAB — CULTURE, BLOOD (ROUTINE X 2)
Culture: NO GROWTH
Culture: NO GROWTH

## 2024-01-27 ENCOUNTER — Ambulatory Visit: Payer: Self-pay | Admitting: Cardiology

## 2024-01-27 ENCOUNTER — Ambulatory Visit: Admitting: Cardiology

## 2024-01-27 ENCOUNTER — Other Ambulatory Visit

## 2024-01-27 ENCOUNTER — Encounter: Payer: Self-pay | Admitting: Cardiology

## 2024-01-27 VITALS — BP 112/78 | HR 102 | Ht 69.0 in | Wt 378.0 lb

## 2024-01-27 DIAGNOSIS — N390 Urinary tract infection, site not specified: Secondary | ICD-10-CM | POA: Diagnosis not present

## 2024-01-27 DIAGNOSIS — E65 Localized adiposity: Secondary | ICD-10-CM | POA: Diagnosis not present

## 2024-01-27 DIAGNOSIS — Z6841 Body Mass Index (BMI) 40.0 and over, adult: Secondary | ICD-10-CM | POA: Diagnosis not present

## 2024-01-27 DIAGNOSIS — R39198 Other difficulties with micturition: Secondary | ICD-10-CM | POA: Diagnosis not present

## 2024-01-27 DIAGNOSIS — E785 Hyperlipidemia, unspecified: Secondary | ICD-10-CM | POA: Diagnosis not present

## 2024-01-27 DIAGNOSIS — I10 Essential (primary) hypertension: Secondary | ICD-10-CM

## 2024-01-27 DIAGNOSIS — E119 Type 2 diabetes mellitus without complications: Secondary | ICD-10-CM | POA: Diagnosis not present

## 2024-01-27 LAB — POCT URINALYSIS DIPSTICK
Bilirubin, UA: POSITIVE
Blood, UA: POSITIVE
Glucose, UA: NEGATIVE
Ketones, UA: NEGATIVE
Nitrite, UA: POSITIVE
Protein, UA: POSITIVE — AB
Spec Grav, UA: 1.025 (ref 1.010–1.025)
Urobilinogen, UA: 0.2 U/dL
pH, UA: 5.5 (ref 5.0–8.0)

## 2024-01-27 MED ORDER — CEPHALEXIN 500 MG PO CAPS: 500.0000 mg | ORAL_CAPSULE | Freq: Four times a day (QID) | ORAL | 0 refills | Status: AC

## 2024-01-27 NOTE — Progress Notes (Signed)
 Established Patient Office Visit  Subjective:  Patient ID: Russell Ray, male    DOB: August 24, 1970  Age: 53 y.o. MRN: [id number]  Chief Complaint  Patient presents with   Acute Visit    Possible UTI    Patient in office for acute visit, thinks he may have a UTI. Patient reports this is the third UTI he has had since August. Patient feels he is getting frequent UTI's due to large abdominal pannus from weight loss. Patient reports he is unable to clean himself adequately after urinating. UA today abnormal, will send for culture. Patient states he has taken Keflex  the past two UTIs. Will send in Keflex .  Blood pressure well controlled today.  Patient seen by plastic surgery, they want him to lose an additional 70 lbs prior to surgery. Continue weight loss for pannectomy.   Urinary Tract Infection  This is a new problem. The current episode started 1 to 4 weeks ago. The problem occurs every urination. The problem has been unchanged. The pain is mild. There has been no fever. Associated symptoms include chills, flank pain, frequency, sweats and urgency. He has tried nothing for the symptoms. The treatment provided no relief.    No other concerns at this time.   Past Medical History:  Diagnosis Date   Anxiety    Aortic valve stenosis    Atrial fibrillation (HCC)    CHF (congestive heart failure) (HCC)    Depression    Diabetes mellitus without complication (HCC)    Hypertension     Past Surgical History:  Procedure Laterality Date   APPLICATION OF WOUND VAC     CARDIAC CATHETERIZATION Right 04/22/2015   Procedure: Right/Left Heart Cath and Coronary Angiography;  Surgeon: Denyse DELENA Bathe, MD;  Location: [facility];  Service: Cardiovascular;  Laterality: Right;   CARDIAC VALVE REPLACEMENT     CARDIOVERSION N/A 05/14/2017   Procedure: CARDIOVERSION;  Surgeon: Bathe Denyse DELENA, MD;  Location: [facility] ORS;  Service: Cardiovascular;  Laterality: N/A;   COLONOSCOPY N/A 10/21/2020    Procedure: COLONOSCOPY;  Surgeon: Toledo, Ladell POUR, MD;  Location: [facility] ENDOSCOPY;  Service: Gastroenterology;  Laterality: N/A;   COLONOSCOPY WITH PROPOFOL  N/A 10/05/2016   Procedure: COLONOSCOPY WITH PROPOFOL ;  Surgeon: Viktoria Lamar DASEN, MD;  Location: [facility] ENDOSCOPY;  Service: Endoscopy;  Laterality: N/A;   debridement sternum     ELECTROPHYSIOLOGIC STUDY N/A 05/05/2015   Procedure: Cardioversion;  Surgeon: Denyse DELENA Bathe, MD;  Location: [facility] ORS;  Service: Cardiovascular;  Laterality: N/A;   ELECTROPHYSIOLOGIC STUDY N/A 05/17/2015   Procedure: CARDIOVERSION;  Surgeon: Denyse DELENA Bathe, MD;  Location: [facility] ORS;  Service: Cardiovascular;  Laterality: N/A;   ESOPHAGOGASTRODUODENOSCOPY (EGD) WITH PROPOFOL  N/A 10/05/2016   Procedure: ESOPHAGOGASTRODUODENOSCOPY (EGD) WITH PROPOFOL ;  Surgeon: Viktoria Lamar DASEN, MD;  Location: [facility] ENDOSCOPY;  Service: Endoscopy;  Laterality: N/A;   GREATER OMENTAL FLAP CLOSURE     HERNIA REPAIR     X3   VALVE REPLACEMENT     VENTRAL HERNIA REPAIR N/A 12/01/2016   Procedure: HERNIA REPAIR VENTRAL ADULT with mesh;  Surgeon: Jordis Laneta FALCON, MD;  Location: [facility] ORS;  Service: General;  Laterality: N/A;    Social History   Socioeconomic History   Marital status: Single    Spouse name: Not on file   Number of children: Not on file   Years of education: Not on file   Highest education level: Not on file  Occupational History   Occupation: Transit Driver  Tobacco Use  Smoking status: Former    Current packs/day: 2.00    Average packs/day: 2.0 packs/day for 28.0 years (56.0 ttl pk-yrs)    Types: Cigarettes   Smokeless tobacco: Never  Vaping Use   Vaping status: Never Used  Substance and Sexual Activity   Alcohol use: No   Drug use: No   Sexual activity: Yes  Other Topics Concern   Not on file  Social History Narrative   Not on file   Social Drivers of Health   Financial Resource Strain: Low Risk  (10/14/2023)   Received from [facility]   Overall Financial Resource Strain (CARDIA)    Difficulty of Paying Living Expenses: Not very hard  Food Insecurity: No Food Insecurity (01/06/2024)   Hunger Vital Sign    Worried About Running Out of Food in the Last Year: Never true    Ran Out of Food in the Last Year: Never true  Transportation Needs: No Transportation Needs (01/06/2024)   PRAPARE - Administrator, Civil Service (Medical): No    Lack of Transportation (Non-Medical): No  Physical Activity: Not on file  Stress: Not on file  Social Connections: Unknown (07/11/2021)   Received from [facility]   Social Network    Social Network: Not on file  Intimate Partner Violence: Not At Risk (01/06/2024)   Humiliation, Afraid, Rape, and Kick questionnaire    Fear of Current or Ex-Partner: No    Emotionally Abused: No    Physically Abused: No    Sexually Abused: No    Family History  Problem Relation Age of Onset   Hypertension Mother    Hypertension Maternal Grandmother    Diabetes Maternal Grandmother     Allergies  Allergen Reactions   Lovenox  [Enoxaparin  Sodium] Itching   Enoxaparin  Anxiety, Dermatitis, Hives, Itching, Nausea Only and Swelling    Outpatient Medications Prior to Visit  Medication Sig   acetaminophen  (TYLENOL ) 325 MG tablet Take 650 mg by mouth every 4 (four) hours as needed for moderate pain.   amiodarone  (PACERONE ) 200 MG tablet TAKE 1 TABLET BY MOUTH TWICE A DAY   budesonide -formoterol  (SYMBICORT ) 80-4.5 MCG/ACT inhaler Inhale 2 puffs into the lungs 2 (two) times daily.   celecoxib  (CELEBREX ) 400 MG capsule TAKE 1 CAPSULE BY MOUTH DAILY.   ibandronate  (BONIVA ) 150 MG tablet Take 1 tablet (150 mg total) by mouth every 30 (thirty) days. Take in the morning with a full glass of water, on an empty stomach, and do not take anything else by mouth or lie down for the next 30 min.   LORazepam  (ATIVAN ) 1 MG tablet Take 1 tablet (1 mg total) by mouth 2 (two) times daily.   magnesium   oxide (MAG-OX) 400 MG tablet TAKE 2 TABLETS BY MOUTH TWICE A DAY   ondansetron  (ZOFRAN ) 4 MG tablet TAKE 1 TABLET BY MOUTH TWICE A DAY   rosuvastatin  (CRESTOR ) 20 MG tablet TAKE 1 TABLET BY MOUTH EVERY DAY IN THE EVENING   tirzepatide  (MOUNJARO ) 5 MG/0.5ML Pen INJECT 5 MG SUBCUTANEOUSLY WEEKLY   warfarin (COUMADIN ) 2 MG tablet TAKE 1 TABLET BY MOUTH DAILY OR AS DIRECTED   cetirizine  (ZYRTEC  ALLERGY) 10 MG tablet Take 1 tablet (10 mg total) by mouth daily. (Patient not taking: Reported on 01/27/2024)   fluticasone  (FLONASE ) 50 MCG/ACT nasal spray Place 1 spray into both nostrils daily. (Patient not taking: Reported on 01/27/2024)   No facility-administered medications prior to visit.    Review of Systems  Constitutional:  Positive for chills.  HENT: Negative.    Eyes: Negative.   Respiratory: Negative.  Negative for shortness of breath.   Cardiovascular: Negative.  Negative for chest pain.  Gastrointestinal: Negative.  Negative for abdominal pain, constipation and diarrhea.  Genitourinary:  Positive for dysuria, flank pain, frequency and urgency.  Musculoskeletal:  Negative for joint pain and myalgias.  Skin: Negative.   Neurological: Negative.  Negative for dizziness and headaches.  Endo/Heme/Allergies: Negative.   All other systems reviewed and are negative.      Objective:   BP 112/78   Pulse (!) 102   Ht 5' 9 (1.753 m)   Wt (!) 378 lb (171.5 kg)   SpO2 99%   BMI 55.82 kg/m   Vitals:   01/27/24 1013  BP: 112/78  Pulse: (!) 102  Height: 5' 9 (1.753 m)  Weight: (!) 378 lb (171.5 kg)  SpO2: 99%  BMI (Calculated): 55.8    Physical Exam Nursing note reviewed.  Constitutional:      Appearance: Normal appearance. He is normal weight.  HENT:     Head: Normocephalic and atraumatic.     Nose: Nose normal.     Mouth/Throat:     Mouth: Mucous membranes are moist.     Pharynx: Oropharynx is clear.  Eyes:     Extraocular Movements: Extraocular movements intact.      Conjunctiva/sclera: Conjunctivae normal.     Pupils: Pupils are equal, round, and reactive to light.  Cardiovascular:     Rate and Rhythm: Normal rate and regular rhythm.     Pulses: Normal pulses.     Heart sounds: Normal heart sounds.  Pulmonary:     Effort: Pulmonary effort is normal.     Breath sounds: Normal breath sounds.  Abdominal:     General: Abdomen is flat. Bowel sounds are normal.     Palpations: Abdomen is soft.  Musculoskeletal:        General: Normal range of motion.     Cervical back: Normal range of motion.  Skin:    General: Skin is warm and dry.  Neurological:     General: No focal deficit present.     Mental Status: He is alert and oriented to person, place, and time.  Psychiatric:        Mood and Affect: Mood normal.        Behavior: Behavior normal.        Thought Content: Thought content normal.        Judgment: Judgment normal.      Results for orders placed or performed in visit on 01/27/24  POCT urinalysis dipstick  Result Value Ref Range   Color, UA red    Clarity, UA     Glucose, UA Negative Negative   Bilirubin, UA Positive    Ketones, UA Negative    Spec Grav, UA 1.025 1.010 - 1.025   Blood, UA Positive    pH, UA 5.5 5.0 - 8.0   Protein, UA Positive (A) Negative   Urobilinogen, UA 0.2 0.2 or 1.0 E.U./dL   Nitrite, UA Positive    Leukocytes, UA Large (3+) (A) Negative   Appearance     Odor      Recent Results (from the past 2160 hours)  Hemoglobin A1c     Status: Abnormal   Collection Time: 11/04/23 11:59 AM  Result Value Ref Range   Hgb A1c MFr Bld 5.8 (H) 4.8 - 5.6 %    Comment:  Prediabetes: 5.7 - 6.4          Diabetes: >6.4          Glycemic control for adults with diabetes: <7.0    Est. average glucose Bld gHb Est-mCnc 120 mg/dL  TSH     Status: None   Collection Time: 11/04/23 11:59 AM  Result Value Ref Range   TSH 2.170 0.450 - 4.500 uIU/mL  CMP14+EGFR     Status: Abnormal   Collection Time: 11/04/23 11:59 AM   Result Value Ref Range   Glucose 89 70 - 99 mg/dL   BUN 23 6 - 24 mg/dL   Creatinine, Ser 9.16 0.76 - 1.27 mg/dL   eGFR 893 >40 fO/fpw/8.26   BUN/Creatinine Ratio 28 (H) 9 - 20   Sodium 138 134 - 144 mmol/L   Potassium 4.8 3.5 - 5.2 mmol/L   Chloride 102 96 - 106 mmol/L   CO2 17 (L) 20 - 29 mmol/L   Calcium  10.5 (H) 8.7 - 10.2 mg/dL   Total Protein 6.9 6.0 - 8.5 g/dL   Albumin  4.4 3.8 - 4.9 g/dL   Globulin, Total 2.5 1.5 - 4.5 g/dL   Bilirubin Total 0.6 0.0 - 1.2 mg/dL   Alkaline Phosphatase 74 44 - 121 IU/L    Comment: **Effective November 11, 2023 Alkaline Phosphatase**   reference interval will be changing to:              Age                Male          Male           0 -  5 days         47 - 127       47 - 127           6 - 10 days         29 - 242       29 - 242          11 - 20 days        109 - 357      109 - 357          21 - 30 days         94 - 494       94 - 494           1 -  2 months      149 - 539      149 - 539           3 -  6 months      131 - 452      131 - 452           7 - 11 months      117 - 401      117 - 401   12 months -  6 years       158 - 369      158 - 369           7 - 12 years       150 - 409      150 - 409               13 years       156 - 435       78 - 227  14 years       114 - 375       64 - 161               15 years        88 - 279       56 - 134               16 years        74 - 207       51 - 121               17 years        63 - 161       47 - 113          18 - 20 years        51 - 125       42 - 106          21 - 50 years         47 - 123       41 - 116          51 - 80 years        49 - 135       51 - 125              >80 years        48 - 129       48 - 129    AST 37 0 - 40 IU/L   ALT 31 0 - 44 IU/L  Lipid panel     Status: Abnormal   Collection Time: 11/04/23 11:59 AM  Result Value Ref Range   Cholesterol, Total 221 (H) 100 - 199 mg/dL   Triglycerides 838 (H) 0 - 149 mg/dL   HDL 68 >60 mg/dL   VLDL  Cholesterol Cal 28 5 - 40 mg/dL   LDL Chol Calc (NIH) 874 (H) 0 - 99 mg/dL   Chol/HDL Ratio 3.3 0.0 - 5.0 ratio    Comment:                                   T. Chol/HDL Ratio                                             Men  Women                               1/2 Avg.Risk  3.4    3.3                                   Avg.Risk  5.0    4.4                                2X Avg.Risk  9.6    7.1                                3X Avg.Risk 23.4   11.0   Comprehensive metabolic panel  Status: Abnormal   Collection Time: 11/15/23  9:04 AM  Result Value Ref Range   Sodium 136 135 - 145 mmol/L   Potassium 4.8 3.5 - 5.1 mmol/L   Chloride 102 98 - 111 mmol/L   CO2 21 (L) 22 - 32 mmol/L   Glucose, Bld 116 (H) 70 - 99 mg/dL    Comment: Glucose reference range applies only to samples taken after fasting for at least 8 hours.   BUN 37 (H) 6 - 20 mg/dL   Creatinine, Ser 8.02 (H) 0.61 - 1.24 mg/dL   Calcium  9.4 8.9 - 10.3 mg/dL   Total Protein 7.0 6.5 - 8.1 g/dL   Albumin  3.4 (L) 3.5 - 5.0 g/dL   AST 35 15 - 41 U/L   ALT 24 0 - 44 U/L   Alkaline Phosphatase 59 38 - 126 U/L   Total Bilirubin 2.1 (H) 0.0 - 1.2 mg/dL   GFR, Estimated 40 (L) >60 mL/min    Comment: (NOTE) Calculated using the CKD-EPI Creatinine Equation (2021)    Anion gap 13 5 - 15    Comment: Performed at [facility], [address], [place], [place] [address]  Troponin I (High Sensitivity)     Status: None   Collection Time: 11/15/23  9:04 AM  Result Value Ref Range   Troponin I (High Sensitivity) 10 <18 ng/L    Comment: (NOTE) Elevated high sensitivity troponin I (hsTnI) values and significant  changes across serial measurements may suggest ACS but many other  chronic and acute conditions are known to elevate hsTnI results.  Refer to the Links section for chest pain algorithms and additional  guidance. Performed at [facility], [address] [place], [place] [address]   Magnesium      Status:  Abnormal   Collection Time: 11/15/23  9:04 AM  Result Value Ref Range   Magnesium  1.5 (L) 1.7 - 2.4 mg/dL    Comment: Performed at [facility], [address] [place], [place] [address]  Brain natriuretic peptide     Status: Abnormal   Collection Time: 11/15/23  9:04 AM  Result Value Ref Range   B Natriuretic Peptide 161.7 (H) 0.0 - 100.0 pg/mL    Comment: Performed at [facility], [address] [place], [place] [address]  CK     Status: None   Collection Time: 11/15/23  9:04 AM  Result Value Ref Range   Total CK 54 49 - 397 U/L    Comment: Performed at [facility], [address] [place], [place] [address]  Protime-INR     Status: Abnormal   Collection Time: 11/15/23  9:04 AM  Result Value Ref Range   Prothrombin  Time 36.2 (H) 11.4 - 15.2 seconds   INR 3.4 (H) 0.8 - 1.2    Comment: (NOTE) INR goal varies based on device and disease states. Performed at [facility], [address] [place], [place] [address]   Blood Culture (routine x 2)     Status: None   Collection Time: 11/15/23  9:04 AM   Specimen: BLOOD  Result Value Ref Range   Specimen Description BLOOD BLOOD LEFT HAND    Special Requests      BOTTLES DRAWN AEROBIC AND ANAEROBIC Blood Culture results may not be optimal due to an inadequate volume of blood received in culture bottles   Culture      NO GROWTH 5 DAYS Performed at [facility], [address] [place], [place]  [address]    Report Status 11/20/2023 FINAL   CBC with Differential/Platelet     Status: Abnormal   Collection Time: 11/15/23  9:04 AM  Result Value Ref Range   WBC 10.4 4.0 - 10.5 K/uL   RBC 4.68 4.22 - 5.81 MIL/uL   Hemoglobin 13.3 13.0 - 17.0 g/dL   HCT 58.4 60.9 - 47.9 %   MCV 88.7 80.0 - 100.0 fL   MCH 28.4 26.0 - 34.0 pg   MCHC 32.0 30.0 - 36.0 g/dL   RDW 83.9 (H) 88.4 - 84.4 %   Platelets 143 (L) 150 - 400 K/uL   nRBC 0.0 0.0 - 0.2 %   Neutrophils Relative % 88 %   Neutro Abs  9.2 (H) 1.7 - 7.7 K/uL   Lymphocytes Relative 5 %   Lymphs Abs 0.5 (L) 0.7 - 4.0 K/uL   Monocytes Relative 5 %   Monocytes Absolute 0.5 0.1 - 1.0 K/uL   Eosinophils Relative 0 %   Eosinophils Absolute 0.0 0.0 - 0.5 K/uL   Basophils Relative 1 %   Basophils Absolute 0.1 0.0 - 0.1 K/uL   Immature Granulocytes 1 %   Abs Immature Granulocytes 0.15 (H) 0.00 - 0.07 K/uL    Comment: Performed at [facility], [address] [place], [place] [address]  Procalcitonin     Status: None   Collection Time: 11/15/23  9:04 AM  Result Value Ref Range   Procalcitonin 2.16 ng/mL    Comment:        Interpretation: PCT > 2 ng/mL: Systemic infection (sepsis) is likely, unless other causes are known. (NOTE)       Sepsis PCT Algorithm           Lower Respiratory Tract                                      Infection PCT Algorithm    ----------------------------     ----------------------------         PCT < 0.25 ng/mL                PCT < 0.10 ng/mL          Strongly encourage             Strongly discourage   discontinuation of antibiotics    initiation of antibiotics    ----------------------------     -----------------------------       PCT 0.25 - 0.50 ng/mL            PCT 0.10 - 0.25 ng/mL               OR       >80% decrease in PCT            Discourage initiation of                                            antibiotics      Encourage discontinuation           of antibiotics    ----------------------------     -----------------------------         PCT >= 0.50 ng/mL              PCT 0.26 - 0.50 ng/mL  AND       <80% decrease in PCT              Encourage initiation of                                             antibiotics       Encourage continuation           of antibiotics    ----------------------------     -----------------------------        PCT >= 0.50 ng/mL                  PCT > 0.50 ng/mL               AND         increase in PCT                  Strongly  encourage                                      initiation of antibiotics    Strongly encourage escalation           of antibiotics                                     -----------------------------                                           PCT <= 0.25 ng/mL                                                 OR                                        > 80% decrease in PCT                                      Discontinue / Do not initiate                                             antibiotics  Performed at [facility], [address] [place], [place] [address]   Lactic acid, plasma     Status: None   Collection Time: 11/15/23  9:16 AM  Result Value Ref Range   Lactic Acid, Venous 1.7 0.5 - 1.9 mmol/L    Comment: Performed at [facility], [address] [place], [place] [address]  Blood Culture (routine x 2)     Status: None   Collection Time: 11/15/23  9:16 AM   Specimen: BLOOD  Result Value Ref Range   Specimen Description BLOOD RIGHT ANTECUBITAL  Special Requests      BOTTLES DRAWN AEROBIC AND ANAEROBIC Blood Culture adequate volume   Culture      NO GROWTH 5 DAYS Performed at [facility], [address] [place], [place] [address]    Report Status 11/20/2023 FINAL   Urinalysis, w/ Reflex to Culture (Infection Suspected) -Urine, Clean Catch     Status: Abnormal   Collection Time: 11/15/23 10:43 AM  Result Value Ref Range   Specimen Source URINE, CLEAN CATCH    Color, Urine Russell Ray (A) YELLOW    Comment: BIOCHEMICALS MAY BE AFFECTED BY COLOR   APPearance CLOUDY (A) CLEAR   Specific Gravity, Urine 1.029 1.005 - 1.030   pH 5.0 5.0 - 8.0   Glucose, UA NEGATIVE NEGATIVE mg/dL   Hgb urine dipstick MODERATE (A) NEGATIVE   Bilirubin Urine NEGATIVE NEGATIVE   Ketones, ur NEGATIVE NEGATIVE mg/dL   Protein, ur 30 (A) NEGATIVE mg/dL   Nitrite NEGATIVE NEGATIVE   Leukocytes,Ua MODERATE (A) NEGATIVE   RBC / HPF 21-50 0 - 5 RBC/hpf   WBC, UA >50 0 - 5  WBC/hpf    Comment:        Reflex urine culture not performed if WBC <=10, OR if Squamous epithelial cells >5. If Squamous epithelial cells >5 suggest recollection.    Bacteria, UA RARE (A) NONE SEEN   Squamous Epithelial / HPF 0-5 0 - 5 /HPF   Mucus PRESENT    Hyaline Casts, UA PRESENT    Granular Casts, UA PRESENT     Comment: Performed at [facility], [address], [place], [place] [address]  Urine Culture     Status: Abnormal   Collection Time: 11/15/23 10:43 AM   Specimen: Urine, Random  Result Value Ref Range   Specimen Description      URINE, RANDOM Performed at [facility], [address] [place], [place] [address]    Special Requests      NONE Reflexed from [id number] Performed at [facility], [address] [place], [place] [address]    Culture >=100,000 COLONIES/mL ESCHERICHIA COLI (A)    Report Status 11/17/2023 FINAL    Organism ID, Bacteria ESCHERICHIA COLI (A)       Susceptibility   Escherichia coli - MIC*    AMPICILLIN >=32 RESISTANT Resistant     CEFAZOLIN (URINE) Value in next row Sensitive      8 SENSITIVEThis is a modified FDA-approved test that has been validated and its performance characteristics determined by the reporting laboratory.  This laboratory is certified under the Clinical Laboratory Improvement Amendments CLIA as qualified to perform high complexity clinical laboratory testing.    CEFEPIME Value in next row Sensitive      8 SENSITIVEThis is a modified FDA-approved test that has been validated and its performance characteristics determined by the reporting laboratory.  This laboratory is certified under the Clinical Laboratory Improvement Amendments CLIA as qualified to perform high complexity clinical laboratory testing.    ERTAPENEM Value in next row Sensitive      8 SENSITIVEThis is a modified FDA-approved test that has been validated and its performance characteristics determined by the reporting laboratory.   This laboratory is certified under the Clinical Laboratory Improvement Amendments CLIA as qualified to perform high complexity clinical laboratory testing.    CEFTRIAXONE  Value in next row Sensitive      8 SENSITIVEThis is a modified FDA-approved test that has been validated and its performance characteristics determined by the reporting laboratory.  This laboratory is certified under the Clinical Laboratory Improvement Amendments CLIA as qualified to perform high complexity clinical laboratory testing.    CIPROFLOXACIN Value in next row Resistant      8 SENSITIVEThis is a modified FDA-approved test that has been validated and its performance characteristics determined by the reporting laboratory.  This laboratory is certified under the Clinical Laboratory Improvement Amendments CLIA as qualified to perform high complexity clinical laboratory testing.    GENTAMICIN Value in next row Resistant      8 SENSITIVEThis is a modified FDA-approved test that has been validated and its performance characteristics determined by the reporting laboratory.  This laboratory is certified under the Clinical Laboratory Improvement Amendments CLIA as qualified to perform high complexity clinical laboratory testing.    NITROFURANTOIN Value in next row Sensitive      8 SENSITIVEThis is a modified FDA-approved test that has been validated and its performance characteristics determined by the reporting laboratory.  This laboratory is certified under the Clinical Laboratory Improvement Amendments CLIA as qualified to perform high complexity clinical laboratory testing.    TRIMETH/SULFA Value in next row Resistant      8 SENSITIVEThis is a modified FDA-approved test that has been validated and its performance characteristics determined by the reporting laboratory.  This laboratory is certified under the Clinical Laboratory Improvement Amendments CLIA as qualified to perform high complexity clinical laboratory testing.     AMPICILLIN/SULBACTAM Value in next row Resistant      8 SENSITIVEThis is a modified FDA-approved test that has been validated and its performance characteristics determined by the reporting laboratory.  This laboratory is certified under the Clinical Laboratory Improvement Amendments CLIA as qualified to perform high complexity clinical laboratory testing.    PIP/TAZO Value in next row Sensitive      8 SENSITIVEThis is a modified FDA-approved test that has been validated and its performance characteristics determined by the reporting laboratory.  This laboratory is certified under the Clinical Laboratory Improvement Amendments CLIA as qualified to perform high complexity clinical laboratory testing.    MEROPENEM Value in next row Sensitive      8 SENSITIVEThis is a modified FDA-approved test that has been validated and its performance characteristics determined by the reporting laboratory.  This laboratory is certified under the Clinical Laboratory Improvement Amendments CLIA as qualified to perform high complexity clinical laboratory testing.    * >=100,000 COLONIES/mL ESCHERICHIA COLI  Lactic acid, plasma     Status: None   Collection Time: 11/15/23 10:57 AM  Result Value Ref Range   Lactic Acid, Venous 1.7 0.5 - 1.9 mmol/L    Comment: Performed at [facility], [address] [place], [place] [address]  Troponin I (High Sensitivity)     Status: None   Collection Time: 11/15/23 10:57 AM  Result Value Ref Range   Troponin I (High Sensitivity) 7 <18 ng/L    Comment: (NOTE) Elevated high sensitivity troponin I (hsTnI) values and significant  changes across serial measurements may suggest ACS but many other  chronic and acute conditions are known to elevate hsTnI results.  Refer to the Links section for chest pain algorithms and additional  guidance. Performed at [facility], [address] [place], [place] [address]   TSH     Status: None   Collection Time: 11/15/23  10:57 AM  Result Value Ref Range   TSH 2.826 0.350 - 4.500 uIU/mL    Comment: Performed by a 3rd Generation assay with a functional sensitivity  of <=0.01 uIU/mL. Performed at [facility], [address] [place], [place] [address]   Cortisol     Status: None   Collection Time: 11/15/23  5:11 PM  Result Value Ref Range   Cortisol, Plasma 19.9 ug/dL    Comment: (NOTE) AM    6.7 - 22.6 ug/dL PM   <89.9       ug/dL Performed at [facility], [address], [place], [place] [address]   HIV Antibody (routine testing w rflx)     Status: None   Collection Time: 11/15/23  5:11 PM  Result Value Ref Range   HIV Screen 4th Generation wRfx Non Reactive Non Reactive    Comment: Performed at [facility], [address], [place], [place] [address]  CBC     Status: Abnormal   Collection Time: 11/16/23  4:28 AM  Result Value Ref Range   WBC 5.1 4.0 - 10.5 K/uL   RBC 3.39 (L) 4.22 - 5.81 MIL/uL   Hemoglobin 9.8 (L) 13.0 - 17.0 g/dL    Comment: REPEATED TO VERIFY   HCT 30.8 (L) 39.0 - 52.0 %   MCV 90.9 80.0 - 100.0 fL   MCH 28.9 26.0 - 34.0 pg   MCHC 31.8 30.0 - 36.0 g/dL   RDW 83.8 (H) 88.4 - 84.4 %   Platelets 100 (L) 150 - 400 K/uL   nRBC 0.0 0.0 - 0.2 %    Comment: Performed at [facility], [address] [place], [place] [address]  Procalcitonin     Status: None   Collection Time: 11/16/23  4:28 AM  Result Value Ref Range   Procalcitonin 1.83 ng/mL    Comment:        Interpretation: PCT > 0.5 ng/mL and <= 2 ng/mL: Systemic infection (sepsis) is possible, but other conditions are known to elevate PCT as well. (NOTE)       Sepsis PCT Algorithm           Lower Respiratory Tract                                      Infection PCT Algorithm    ----------------------------     ----------------------------         PCT < 0.25 ng/mL                PCT < 0.10 ng/mL          Strongly encourage             Strongly discourage   discontinuation of  antibiotics    initiation of antibiotics    ----------------------------     -----------------------------       PCT 0.25 - 0.50 ng/mL            PCT 0.10 - 0.25 ng/mL               OR       >80% decrease in PCT            Discourage initiation of                                            antibiotics      Encourage discontinuation  of antibiotics    ----------------------------     -----------------------------         PCT >= 0.50 ng/mL              PCT 0.26 - 0.50 ng/mL                AND       <80% decrease in PCT             Encourage initiation of                                             antibiotics       Encourage continuation           of antibiotics    ----------------------------     -----------------------------        PCT >= 0.50 ng/mL                  PCT > 0.50 ng/mL               AND         increase in PCT                  Strongly encourage                                      initiation of antibiotics    Strongly encourage escalation           of antibiotics                                     -----------------------------                                           PCT <= 0.25 ng/mL                                                 OR                                        > 80% decrease in PCT                                      Discontinue / Do not initiate                                             antibiotics  Performed at [facility], [address] [place], [place] [address]   Basic metabolic panel     Status: Abnormal   Collection Time: 11/16/23  4:28 AM  Result Value Ref Range   Sodium 132 (L) 135 - 145 mmol/L  Potassium 4.6 3.5 - 5.1 mmol/L   Chloride 101 98 - 111 mmol/L   CO2 20 (L) 22 - 32 mmol/L   Glucose, Bld 99 70 - 99 mg/dL    Comment: Glucose reference range applies only to samples taken after fasting for at least 8 hours.   BUN 31 (H) 6 - 20 mg/dL   Creatinine, Ser 8.59 (H) 0.61 - 1.24 mg/dL   Calcium  9.0 8.9 - 10.3  mg/dL   GFR, Estimated >39 >39 mL/min    Comment: (NOTE) Calculated using the CKD-EPI Creatinine Equation (2021)    Anion gap 11 5 - 15    Comment: Performed at [facility], [address] [place], [place] [address]  Protime-INR     Status: Abnormal   Collection Time: 11/16/23  4:28 AM  Result Value Ref Range   Prothrombin  Time 35.2 (H) 11.4 - 15.2 seconds   INR 3.3 (H) 0.8 - 1.2    Comment: (NOTE) INR goal varies based on device and disease states. Performed at [facility], [address] [place], [place] [address]   Magnesium      Status: Abnormal   Collection Time: 11/16/23  4:28 AM  Result Value Ref Range   Magnesium  1.6 (L) 1.7 - 2.4 mg/dL    Comment: Performed at [facility], [address] [place], [place] [address]  ECHOCARDIOGRAM COMPLETE     Status: None   Collection Time: 11/16/23 10:01 AM  Result Value Ref Range   Weight 6,208 oz   Height 67 in   BP 98/59 mmHg   Ao pk vel 1.97 m/s   AV Mean grad 8.0 mmHg   AV Peak grad 15.5 mmHg   S' Lateral 3.60 cm   Area-P 1/2 5.23 cm2   Est EF 60 - 65%   CBC     Status: Abnormal   Collection Time: 11/17/23  4:41 AM  Result Value Ref Range   WBC 3.9 (L) 4.0 - 10.5 K/uL   RBC 3.87 (L) 4.22 - 5.81 MIL/uL   Hemoglobin 11.1 (L) 13.0 - 17.0 g/dL   HCT 65.5 (L) 60.9 - 47.9 %   MCV 88.9 80.0 - 100.0 fL   MCH 28.7 26.0 - 34.0 pg   MCHC 32.3 30.0 - 36.0 g/dL   RDW 83.6 (H) 88.4 - 84.4 %   Platelets 115 (L) 150 - 400 K/uL   nRBC 0.0 0.0 - 0.2 %    Comment: Performed at [facility], [address] [place], [place] [address]  Protime-INR     Status: Abnormal   Collection Time: 11/17/23  4:41 AM  Result Value Ref Range   Prothrombin  Time 27.0 (H) 11.4 - 15.2 seconds   INR 2.4 (H) 0.8 - 1.2    Comment: (NOTE) INR goal varies based on device and disease states. Performed at [facility], [address] [place], [place] [address]   Basic metabolic panel     Status:  Abnormal   Collection Time: 11/17/23  4:41 AM  Result Value Ref Range   Sodium 130 (L) 135 - 145 mmol/L   Potassium 3.7 3.5 - 5.1 mmol/L   Chloride 101 98 - 111 mmol/L   CO2 21 (L) 22 - 32 mmol/L   Glucose, Bld 108 (H) 70 - 99 mg/dL    Comment: Glucose reference range applies only to samples taken after fasting for at least 8 hours.   BUN 27 (H) 6 - 20 mg/dL   Creatinine,  Ser 1.20 0.61 - 1.24 mg/dL   Calcium  9.0 8.9 - 10.3 mg/dL   GFR, Estimated >39 >39 mL/min    Comment: (NOTE) Calculated using the CKD-EPI Creatinine Equation (2021)    Anion gap 8 5 - 15    Comment: Performed at [facility], [address] [place], [place] [address]  Magnesium      Status: None   Collection Time: 11/17/23  4:41 AM  Result Value Ref Range   Magnesium  1.9 1.7 - 2.4 mg/dL    Comment: Performed at [facility], [address] [place], [place] [address]  POCT CBG (Fasting - Glucose)     Status: Abnormal   Collection Time: 11/22/23 10:55 AM  Result Value Ref Range   Glucose Fasting, POC 109 (A) 70 - 99 mg/dL  AFE1+Jwpnw Gap     Status: None   Collection Time: 11/22/23 11:41 AM  Result Value Ref Range   Glucose 93 70 - 99 mg/dL   BUN 13 6 - 24 mg/dL   Creatinine, Ser 9.11 0.76 - 1.27 mg/dL   eGFR 895 >40 fO/fpw/8.26   BUN/Creatinine Ratio 15 9 - 20   Sodium 137 134 - 144 mmol/L   Potassium 3.9 3.5 - 5.2 mmol/L   Chloride 100 96 - 106 mmol/L   CO2 20 20 - 29 mmol/L   Anion Gap 17.0 10.0 - 18.0 mmol/L   Calcium  9.7 8.7 - 10.2 mg/dL  Lipid panel     Status: Abnormal   Collection Time: 11/22/23 11:41 AM  Result Value Ref Range   Cholesterol, Total 130 100 - 199 mg/dL   Triglycerides 847 (H) 0 - 149 mg/dL   HDL 32 (L) >60 mg/dL   VLDL Cholesterol Cal 27 5 - 40 mg/dL   LDL Chol Calc (NIH) 71 0 - 99 mg/dL   Chol/HDL Ratio 4.1 0.0 - 5.0 ratio    Comment:                                   T. Chol/HDL Ratio                                             Men  Women                                1/2 Avg.Risk  3.4    3.3                                   Avg.Risk  5.0    4.4                                2X Avg.Risk  9.6    7.1                                3X Avg.Risk 23.4   11.0   Basic metabolic panel     Status: Abnormal   Collection Time: 01/06/24  9:59 AM  Result Value Ref Range   Sodium 134 (L) 135 - 145 mmol/L  Potassium 4.5 3.5 - 5.1 mmol/L   Chloride 102 98 - 111 mmol/L   CO2 19 (L) 22 - 32 mmol/L   Glucose, Bld 135 (H) 70 - 99 mg/dL    Comment: Glucose reference range applies only to samples taken after fasting for at least 8 hours.   BUN 25 (H) 6 - 20 mg/dL   Creatinine, Ser 8.55 (H) 0.61 - 1.24 mg/dL   Calcium  9.7 8.9 - 10.3 mg/dL   GFR, Estimated 58 (L) >60 mL/min    Comment: (NOTE) Calculated using the CKD-EPI Creatinine Equation (2021)    Anion gap 13 5 - 15    Comment: Performed at [facility], [address] [place], [place] [address]  CBC     Status: Abnormal   Collection Time: 01/06/24  9:59 AM  Result Value Ref Range   WBC 16.0 (H) 4.0 - 10.5 K/uL   RBC 4.56 4.22 - 5.81 MIL/uL   Hemoglobin 12.9 (L) 13.0 - 17.0 g/dL   HCT 59.4 60.9 - 47.9 %   MCV 88.8 80.0 - 100.0 fL   MCH 28.3 26.0 - 34.0 pg   MCHC 31.9 30.0 - 36.0 g/dL   RDW 84.6 88.4 - 84.4 %   Platelets 173 150 - 400 K/uL   nRBC 0.0 0.0 - 0.2 %    Comment: Performed at [facility], [address], [place], [place] [address]  BNP (Order if Patient has history of Heart Failure)     Status: Abnormal   Collection Time: 01/06/24  9:59 AM  Result Value Ref Range   B Natriuretic Peptide 166.4 (H) 0.0 - 100.0 pg/mL    Comment: Performed at [facility], [address] [place], [place] [address]  CK     Status: None   Collection Time: 01/06/24  9:59 AM  Result Value Ref Range   Total CK 68 49 - 397 U/L    Comment: Performed at [facility], [address] [place], [place] [address]  Hepatic function panel     Status: Abnormal    Collection Time: 01/06/24  9:59 AM  Result Value Ref Range   Total Protein 7.5 6.5 - 8.1 g/dL   Albumin  3.3 (L) 3.5 - 5.0 g/dL   AST 20 15 - 41 U/L   ALT 14 0 - 44 U/L   Alkaline Phosphatase 68 38 - 126 U/L   Total Bilirubin 1.9 (H) 0.0 - 1.2 mg/dL   Bilirubin, Direct 0.6 (H) 0.0 - 0.2 mg/dL   Indirect Bilirubin 1.3 (H) 0.3 - 0.9 mg/dL    Comment: Performed at [facility], [address] [place], [place] [address]  Lipase, blood     Status: None   Collection Time: 01/06/24  9:59 AM  Result Value Ref Range   Lipase 24 11 - 51 U/L    Comment: Performed at [facility], [address] [place], [place] [address]  Troponin I (High Sensitivity)     Status: Abnormal   Collection Time: 01/06/24  9:59 AM  Result Value Ref Range   Troponin I (High Sensitivity) 110 (HH) <18 ng/L    Comment: CRITICAL RESULT CALLED TO, READ BACK BY AND VERIFIED WITH BRANDON GROGG AT 1303 01/06/24 DAS (NOTE) Elevated high sensitivity troponin I (hsTnI) values and significant  changes across serial measurements may suggest ACS but many other  chronic and acute conditions are known to elevate hsTnI results.  Refer to the Links section for chest pain algorithms  and additional  guidance. Performed at [facility], [address] [place], [place] [address]   Magnesium      Status: None   Collection Time: 01/06/24  9:59 AM  Result Value Ref Range   Magnesium  1.8 1.7 - 2.4 mg/dL    Comment: Performed at [facility], [address] [place], [place] [address]  TSH     Status: None   Collection Time: 01/06/24  9:59 AM  Result Value Ref Range   TSH 2.315 0.350 - 4.500 uIU/mL    Comment: Performed by a 3rd Generation assay with a functional sensitivity of <=0.01 uIU/mL. Performed at [facility], [address] [place], [place] [address]   Protime-INR     Status: Abnormal   Collection Time: 01/06/24 10:41 AM  Result Value Ref Range   Prothrombin  Time 26.5  (H) 11.4 - 15.2 seconds   INR 2.3 (H) 0.8 - 1.2    Comment: (NOTE) INR goal varies based on device and disease states. Performed at [facility], [address] [place], [place] [address]   APTT     Status: Abnormal   Collection Time: 01/06/24 10:41 AM  Result Value Ref Range   aPTT 56 (H) 24 - 36 seconds    Comment:        IF BASELINE aPTT IS ELEVATED, SUGGEST PATIENT RISK ASSESSMENT BE USED TO DETERMINE APPROPRIATE ANTICOAGULANT THERAPY. Performed at [facility], [address] [place], [place] [address]   Blood culture (routine x 2)     Status: None   Collection Time: 01/06/24 10:41 AM   Specimen: BLOOD  Result Value Ref Range   Specimen Description BLOOD RIGHT ANTECUBITAL    Special Requests      BOTTLES DRAWN AEROBIC AND ANAEROBIC Blood Culture results may not be optimal due to an inadequate volume of blood received in culture bottles   Culture      NO GROWTH 5 DAYS Performed at [facility], [address] [place], [place] [address]    Report Status 01/11/2024 FINAL   Blood culture (routine x 2)     Status: None   Collection Time: 01/06/24 10:41 AM   Specimen: BLOOD  Result Value Ref Range   Specimen Description BLOOD LEFT ANTECUBITAL    Special Requests      BOTTLES DRAWN AEROBIC AND ANAEROBIC Blood Culture results may not be optimal due to an inadequate volume of blood received in culture bottles   Culture      NO GROWTH 5 DAYS Performed at [facility], [address] [place], [place] [address]    Report Status 01/11/2024 FINAL   Lactic acid, plasma     Status: None   Collection Time: 01/06/24 10:41 AM  Result Value Ref Range   Lactic Acid, Venous 1.4 0.5 - 1.9 mmol/L    Comment: Performed at [facility], [address] [place], [place] [address]  Urinalysis, Routine w reflex microscopic -Urine, Clean Catch     Status: Abnormal   Collection Time: 01/06/24 11:21 AM  Result Value Ref Range   Color, Urine  Russell Ray (A) YELLOW    Comment: BIOCHEMICALS MAY BE AFFECTED BY COLOR   APPearance CLOUDY (A) CLEAR   Specific Gravity, Urine 1.029 1.005 - 1.030   pH 5.0 5.0 - 8.0   Glucose, UA NEGATIVE NEGATIVE mg/dL   Hgb urine dipstick MODERATE (A) NEGATIVE   Bilirubin Urine NEGATIVE NEGATIVE   Ketones, ur NEGATIVE NEGATIVE mg/dL  Protein, ur 100 (A) NEGATIVE mg/dL   Nitrite NEGATIVE NEGATIVE   Leukocytes,Ua MODERATE (A) NEGATIVE   RBC / HPF >50 0 - 5 RBC/hpf   WBC, UA >50 0 - 5 WBC/hpf   Bacteria, UA FEW (A) NONE SEEN   Squamous Epithelial / HPF 0-5 0 - 5 /HPF   WBC Clumps PRESENT    Mucus PRESENT    Hyaline Casts, UA PRESENT     Comment: Performed at [facility], [address], [place], [place] [address]  Urine Culture     Status: Abnormal   Collection Time: 01/06/24 11:21 AM   Specimen: Urine, Clean Catch  Result Value Ref Range   Specimen Description      URINE, CLEAN CATCH Performed at [facility], [address], [place], [place] [address]    Special Requests      NONE Performed at [facility], [address], [place], [place] [address]    Culture >=100,000 COLONIES/mL ESCHERICHIA COLI (A)    Report Status 01/08/2024 FINAL    Organism ID, Bacteria ESCHERICHIA COLI (A)       Susceptibility   Escherichia coli - MIC*    AMPICILLIN >=32 RESISTANT Resistant     CEFAZOLIN (URINE) Value in next row Sensitive      8 SENSITIVEThis is a modified FDA-approved test that has been validated and its performance characteristics determined by the reporting laboratory.  This laboratory is certified under the Clinical Laboratory Improvement Amendments CLIA as qualified to perform high complexity clinical laboratory testing.    CEFEPIME Value in next row Sensitive      8 SENSITIVEThis is a modified FDA-approved test that has been validated and its performance characteristics determined by the reporting laboratory.  This laboratory is certified under the Clinical  Laboratory Improvement Amendments CLIA as qualified to perform high complexity clinical laboratory testing.    ERTAPENEM Value in next row Sensitive      8 SENSITIVEThis is a modified FDA-approved test that has been validated and its performance characteristics determined by the reporting laboratory.  This laboratory is certified under the Clinical Laboratory Improvement Amendments CLIA as qualified to perform high complexity clinical laboratory testing.    CEFTRIAXONE  Value in next row Sensitive      8 SENSITIVEThis is a modified FDA-approved test that has been validated and its performance characteristics determined by the reporting laboratory.  This laboratory is certified under the Clinical Laboratory Improvement Amendments CLIA as qualified to perform high complexity clinical laboratory testing.    CIPROFLOXACIN Value in next row Resistant      8 SENSITIVEThis is a modified FDA-approved test that has been validated and its performance characteristics determined by the reporting laboratory.  This laboratory is certified under the Clinical Laboratory Improvement Amendments CLIA as qualified to perform high complexity clinical laboratory testing.    GENTAMICIN Value in next row Resistant      8 SENSITIVEThis is a modified FDA-approved test that has been validated and its performance characteristics determined by the reporting laboratory.  This laboratory is certified under the Clinical Laboratory Improvement Amendments CLIA as qualified to perform high complexity clinical laboratory testing.    NITROFURANTOIN Value in next row Sensitive      8 SENSITIVEThis is a modified FDA-approved test that has been validated and its performance characteristics determined by the reporting laboratory.  This laboratory is certified under the Clinical Laboratory Improvement Amendments CLIA as qualified to perform high complexity clinical laboratory testing.    TRIMETH/SULFA  Value in next row Resistant      8  SENSITIVEThis is a modified FDA-approved test that has been validated and its performance characteristics determined by the reporting laboratory.  This laboratory is certified under the Clinical Laboratory Improvement Amendments CLIA as qualified to perform high complexity clinical laboratory testing.    AMPICILLIN/SULBACTAM Value in next row Resistant      8 SENSITIVEThis is a modified FDA-approved test that has been validated and its performance characteristics determined by the reporting laboratory.  This laboratory is certified under the Clinical Laboratory Improvement Amendments CLIA as qualified to perform high complexity clinical laboratory testing.    PIP/TAZO Value in next row Sensitive      8 SENSITIVEThis is a modified FDA-approved test that has been validated and its performance characteristics determined by the reporting laboratory.  This laboratory is certified under the Clinical Laboratory Improvement Amendments CLIA as qualified to perform high complexity clinical laboratory testing.    MEROPENEM Value in next row Sensitive      8 SENSITIVEThis is a modified FDA-approved test that has been validated and its performance characteristics determined by the reporting laboratory.  This laboratory is certified under the Clinical Laboratory Improvement Amendments CLIA as qualified to perform high complexity clinical laboratory testing.    * >=100,000 COLONIES/mL ESCHERICHIA COLI  Lactic acid, plasma     Status: None   Collection Time: 01/06/24  5:09 PM  Result Value Ref Range   Lactic Acid, Venous 1.7 0.5 - 1.9 mmol/L    Comment: Performed at [facility], [address] [place], [place] [address]  CBC     Status: Abnormal   Collection Time: 01/07/24  4:14 AM  Result Value Ref Range   WBC 5.5 4.0 - 10.5 K/uL   RBC 3.42 (L) 4.22 - 5.81 MIL/uL   Hemoglobin 9.6 (L) 13.0 - 17.0 g/dL    Comment: REPEATED TO VERIFY   HCT 30.3 (L) 39.0 - 52.0 %   MCV 88.6 80.0 - 100.0 fL   MCH 28.1  26.0 - 34.0 pg   MCHC 31.7 30.0 - 36.0 g/dL   RDW 84.4 88.4 - 84.4 %   Platelets 120 (L) 150 - 400 K/uL   nRBC 0.0 0.0 - 0.2 %    Comment: Performed at [facility], [address] [place], [place] [address]  Comprehensive metabolic panel     Status: Abnormal   Collection Time: 01/07/24  4:14 AM  Result Value Ref Range   Sodium 134 (L) 135 - 145 mmol/L   Potassium 3.8 3.5 - 5.1 mmol/L   Chloride 104 98 - 111 mmol/L   CO2 20 (L) 22 - 32 mmol/L   Glucose, Bld 93 70 - 99 mg/dL    Comment: Glucose reference range applies only to samples taken after fasting for at least 8 hours.   BUN 24 (H) 6 - 20 mg/dL   Creatinine, Ser 9.12 0.61 - 1.24 mg/dL   Calcium  9.1 8.9 - 10.3 mg/dL   Total Protein 6.1 (L) 6.5 - 8.1 g/dL   Albumin  3.1 (L) 3.5 - 5.0 g/dL   AST 18 15 - 41 U/L   ALT 12 0 - 44 U/L   Alkaline Phosphatase 57 38 - 126 U/L   Total Bilirubin 1.1 0.0 - 1.2 mg/dL   GFR, Estimated >39 >39 mL/min    Comment: (NOTE) Calculated using the CKD-EPI Creatinine Equation (2021)    Anion gap 10 5 - 15    Comment: Performed  at [facility], [address] [place], [place] [address]  Protime-INR     Status: Abnormal   Collection Time: 01/07/24  4:14 AM  Result Value Ref Range   Prothrombin  Time 27.4 (H) 11.4 - 15.2 seconds   INR 2.4 (H) 0.8 - 1.2    Comment: (NOTE) INR goal varies based on device and disease states. Performed at [facility], [address] [place], [place] [address]   POCT urinalysis dipstick     Status: Abnormal   Collection Time: 01/27/24 10:32 AM  Result Value Ref Range   Color, UA red    Clarity, UA     Glucose, UA Negative Negative   Bilirubin, UA Positive    Ketones, UA Negative    Spec Grav, UA 1.025 1.010 - 1.025   Blood, UA Positive    pH, UA 5.5 5.0 - 8.0   Protein, UA Positive (A) Negative   Urobilinogen, UA 0.2 0.2 or 1.0 E.U./dL   Nitrite, UA Positive    Leukocytes, UA Large (3+) (A) Negative   Appearance     Odor         Assessment & Plan:  Urine culture Keflex   Problem List Items Addressed This Visit       Cardiovascular and Mediastinum   Essential (primary) hypertension     Genitourinary   Complicated UTI (urinary tract infection) - Primary   Relevant Medications   cephALEXin  (KEFLEX ) 500 MG capsule     Other   Morbid obesity with BMI of 50.0-59.9, adult (HCC)   Pannus, abdominal   Abnormal urination   Relevant Orders   POCT urinalysis dipstick (Completed)   Urine Culture    Return if symptoms worsen or fail to improve, for as scheduled with TJ.   Total time spent: 25 minutes. This time includes review of previous notes and results and patient face to face interaction during today's visit.    Jeoffrey Pollen, NP  01/27/2024   This document may have been prepared by Dragon Voice Recognition software and as such may include unintentional dictation errors.

## 2024-01-28 LAB — LIPID PANEL
Chol/HDL Ratio: 1.9 ratio (ref 0.0–5.0)
Cholesterol, Total: 138 mg/dL (ref 100–199)
HDL: 73 mg/dL (ref >39)
LDL Chol Calc (NIH): 46 mg/dL (ref 0–99)
Triglycerides: 106 mg/dL (ref 0–149)
VLDL Cholesterol Cal: 19 mg/dL (ref 5–40)

## 2024-01-28 LAB — HEMOGLOBIN A1C
Est. average glucose Bld gHb Est-mCnc: 111 mg/dL
Hgb A1c MFr Bld: 5.5 % (ref 4.8–5.6)

## 2024-01-30 LAB — URINE CULTURE

## 2024-01-30 NOTE — Progress Notes (Signed)
 Left pt. A vm to call back about results.

## 2024-01-30 NOTE — Progress Notes (Signed)
Pt informed

## 2024-02-01 ENCOUNTER — Other Ambulatory Visit: Payer: Self-pay | Admitting: Internal Medicine

## 2024-02-01 DIAGNOSIS — M816 Localized osteoporosis [Lequesne]: Secondary | ICD-10-CM

## 2024-02-14 ENCOUNTER — Ambulatory Visit: Admitting: Internal Medicine

## 2024-02-14 ENCOUNTER — Ambulatory Visit: Payer: Self-pay | Admitting: Internal Medicine

## 2024-02-14 VITALS — BP 126/84 | HR 73 | Temp 97.9°F | Ht 69.0 in | Wt 373.7 lb

## 2024-02-14 DIAGNOSIS — E782 Mixed hyperlipidemia: Secondary | ICD-10-CM | POA: Diagnosis not present

## 2024-02-14 DIAGNOSIS — M25569 Pain in unspecified knee: Secondary | ICD-10-CM

## 2024-02-14 DIAGNOSIS — I10 Essential (primary) hypertension: Secondary | ICD-10-CM

## 2024-02-14 DIAGNOSIS — F419 Anxiety disorder, unspecified: Secondary | ICD-10-CM | POA: Diagnosis not present

## 2024-02-14 DIAGNOSIS — R809 Proteinuria, unspecified: Secondary | ICD-10-CM | POA: Diagnosis not present

## 2024-02-14 DIAGNOSIS — N390 Urinary tract infection, site not specified: Secondary | ICD-10-CM | POA: Diagnosis not present

## 2024-02-14 DIAGNOSIS — J208 Acute bronchitis due to other specified organisms: Secondary | ICD-10-CM

## 2024-02-14 DIAGNOSIS — E785 Hyperlipidemia, unspecified: Secondary | ICD-10-CM

## 2024-02-14 DIAGNOSIS — E1129 Type 2 diabetes mellitus with other diabetic kidney complication: Secondary | ICD-10-CM | POA: Diagnosis not present

## 2024-02-14 DIAGNOSIS — E119 Type 2 diabetes mellitus without complications: Secondary | ICD-10-CM

## 2024-02-14 LAB — POC CREATINE & ALBUMIN,URINE
Creatinine, POC: 200 mg/dL
Microalbumin Ur, POC: 150 mg/L

## 2024-02-14 LAB — POCT CBG (FASTING - GLUCOSE)-MANUAL ENTRY: Glucose Fasting, POC: 108 mg/dL — AB (ref 70–99)

## 2024-02-14 MED ORDER — BUDESONIDE-FORMOTEROL FUMARATE 80-4.5 MCG/ACT IN AERO: 2.0000 | INHALATION_SPRAY | Freq: Two times a day (BID) | RESPIRATORY_TRACT | 2 refills | Status: AC

## 2024-02-14 MED ORDER — LORAZEPAM 1 MG PO TABS: 1.0000 mg | ORAL_TABLET | Freq: Two times a day (BID) | ORAL | 2 refills | Status: AC

## 2024-02-14 MED ORDER — CEPHALEXIN 500 MG PO CAPS: 500.0000 mg | ORAL_CAPSULE | Freq: Four times a day (QID) | ORAL | 0 refills | Status: AC

## 2024-02-14 MED ORDER — MOUNJARO 5 MG/0.5ML ~~LOC~~ SOAJ: 5.0000 mg | SUBCUTANEOUS | 2 refills | Status: AC

## 2024-02-14 MED ORDER — CELECOXIB 400 MG PO CAPS: 400.0000 mg | ORAL_CAPSULE | Freq: Every day | ORAL | 2 refills | Status: AC

## 2024-02-14 MED ORDER — KERENDIA 10 MG PO TABS: 10.0000 mg | ORAL_TABLET | Freq: Every day | ORAL | 0 refills | Status: DC

## 2024-02-14 MED ORDER — LORAZEPAM 1 MG PO TABS: 1.0000 mg | ORAL_TABLET | Freq: Two times a day (BID) | ORAL | 2 refills | Status: DC

## 2024-02-14 NOTE — Progress Notes (Signed)
 "  Established Patient Office Visit  Subjective:  Patient ID: Russell Ray, male    DOB: [birth year]  Age: 53 y.o. MRN: [id number]  Chief Complaint  Patient presents with   Follow-up    Needs a Uacr    No new complaints, here for lab review and medication refills. Labs reviewed and notable for well controlled diabetes, A1c at target, lipids at target. Denies any hypoglycemic episodes and home bg readings have been at target.      No other concerns at this time.   Past Medical History:  Diagnosis Date   Anxiety    Aortic valve stenosis    Atrial fibrillation (HCC)    CHF (congestive heart failure) (HCC)    Depression    Diabetes mellitus without complication (HCC)    Hypertension     Past Surgical History:  Procedure Laterality Date   APPLICATION OF WOUND VAC     CARDIAC CATHETERIZATION Right 04/22/2015   Procedure: Right/Left Heart Cath and Coronary Angiography;  Surgeon: Denyse DELENA Bathe, MD;  Location: [facility];  Service: Cardiovascular;  Laterality: Right;   CARDIAC VALVE REPLACEMENT     CARDIOVERSION N/A 05/14/2017   Procedure: CARDIOVERSION;  Surgeon: Bathe Denyse DELENA, MD;  Location: [facility];  Service: Cardiovascular;  Laterality: N/A;   COLONOSCOPY N/A 10/21/2020   Procedure: COLONOSCOPY;  Surgeon: Toledo, Ladell POUR, MD;  Location: [facility] ENDOSCOPY;  Service: Gastroenterology;  Laterality: N/A;   COLONOSCOPY WITH PROPOFOL  N/A 10/05/2016   Procedure: COLONOSCOPY WITH PROPOFOL ;  Surgeon: Viktoria Lamar DASEN, MD;  Location: [facility] ENDOSCOPY;  Service: Endoscopy;  Laterality: N/A;   debridement sternum     ELECTROPHYSIOLOGIC STUDY N/A 05/05/2015   Procedure: Cardioversion;  Surgeon: Denyse DELENA Bathe, MD;  Location: [facility] ORS;  Service: Cardiovascular;  Laterality: N/A;   ELECTROPHYSIOLOGIC STUDY N/A 05/17/2015   Procedure: CARDIOVERSION;  Surgeon: Denyse DELENA Bathe, MD;  Location: [facility] ORS;  Service: Cardiovascular;  Laterality: N/A;   ESOPHAGOGASTRODUODENOSCOPY (EGD) WITH  PROPOFOL  N/A 10/05/2016   Procedure: ESOPHAGOGASTRODUODENOSCOPY (EGD) WITH PROPOFOL ;  Surgeon: Viktoria Lamar DASEN, MD;  Location: [facility] ENDOSCOPY;  Service: Endoscopy;  Laterality: N/A;   GREATER OMENTAL FLAP CLOSURE     HERNIA REPAIR     X3   VALVE REPLACEMENT     VENTRAL HERNIA REPAIR N/A 12/01/2016   Procedure: HERNIA REPAIR VENTRAL ADULT with mesh;  Surgeon: Jordis Laneta FALCON, MD;  Location: [facility] ORS;  Service: General;  Laterality: N/A;    Social History   Socioeconomic History   Marital status: Single    Spouse name: Not on file   Number of children: Not on file   Years of education: Not on file   Highest education level: Not on file  Occupational History   Occupation: Transit Driver  Tobacco Use   Smoking status: Former    Current packs/day: 2.00    Average packs/day: 2.0 packs/day for 28.0 years (56.0 ttl pk-yrs)    Types: Cigarettes   Smokeless tobacco: Never  Vaping Use   Vaping status: Never Used  Substance and Sexual Activity   Alcohol use: No   Drug use: No   Sexual activity: Yes  Other Topics Concern   Not on file  Social History Narrative   Not on file   Social Drivers of Health   Tobacco Use: Medium Risk (01/27/2024)   Patient History    Smoking Tobacco Use: Former    Smokeless Tobacco Use: Never    Passive Exposure: Not on Hospital Doctor  Resource Strain: Low Risk  (10/14/2023)   Received from [facility]   Overall Financial Resource Strain (CARDIA)    Difficulty of Paying Living Expenses: Not very hard  Food Insecurity: No Food Insecurity (01/06/2024)   Epic    Worried About Running Out of Food in the Last Year: Never true    Ran Out of Food in the Last Year: Never true  Transportation Needs: No Transportation Needs (01/06/2024)   Epic    Lack of Transportation (Medical): No    Lack of Transportation (Non-Medical): No  Physical Activity: Not on file  Stress: Not on file  Social Connections: Unknown (07/11/2021)   [facility] from  [facility]   Social Network    Social Network: Not on file  Intimate Partner Violence: Not At Risk (01/06/2024)   Epic    Fear of Current or Ex-Partner: No    Emotionally Abused: No    Physically Abused: No    Sexually Abused: No  Depression (PHQ2-9): Low Risk (02/06/2023)   Depression (PHQ2-9)    PHQ-2 Score: 0  Alcohol Screen: Not on file  Housing: Low Risk (01/06/2024)   Epic    Unable to Pay for Housing in the Last Year: No    Number of Times Moved in the Last Year: 0    Homeless in the Last Year: No  Utilities: Not At Risk (01/06/2024)   Epic    Threatened with loss of utilities: No  Health Literacy: Not on file    Family History  Problem Relation Age of Onset   Hypertension Mother    Hypertension Maternal Grandmother    Diabetes Maternal Grandmother     Allergies[1]  Show/hide medication list[2]  Review of Systems  Constitutional: Negative.  Negative for chills and fever.  HENT: Negative.    Eyes: Negative.   Respiratory: Negative.    Cardiovascular: Negative.   Gastrointestinal: Negative.   Genitourinary: Negative.   Skin: Negative.   Neurological: Negative.   Endo/Heme/Allergies: Negative.        Objective:   BP 126/84   Pulse 73   Temp 97.9 F (36.6 C)   Ht 5' 9 (1.753 m)   Wt (!) 373 lb 11.2 oz (169.5 kg)   SpO2 97%   BMI 55.19 kg/m   Vitals:   02/14/24 1012  BP: 126/84  Pulse: 73  Temp: 97.9 F (36.6 C)  Height: 5' 9 (1.753 m)  Weight: (!) 373 lb 11.2 oz (169.5 kg)  SpO2: 97%  BMI (Calculated): 55.16    Physical Exam Vitals reviewed.  Constitutional:      Appearance: Normal appearance.  HENT:     Head: Normocephalic.     Left Ear: There is no impacted cerumen.     Nose: Nose normal.     Mouth/Throat:     Mouth: Mucous membranes are moist.     Pharynx: No posterior oropharyngeal erythema.  Eyes:     Extraocular Movements: Extraocular movements intact.     Pupils: Pupils are equal, round, and reactive to light.   Cardiovascular:     Rate and Rhythm: Regular rhythm.     Chest Wall: PMI is not displaced.     Pulses: Normal pulses.     Heart sounds: Normal heart sounds. No murmur heard. Pulmonary:     Effort: Pulmonary effort is normal.     Breath sounds: Normal air entry. No rhonchi or rales.  Abdominal:     General: Abdomen is flat. Bowel sounds are  normal. There is no distension.     Palpations: Abdomen is soft. There is no hepatomegaly, splenomegaly or mass.     Tenderness: There is no abdominal tenderness.     Comments: Large pannus  Musculoskeletal:        General: Normal range of motion.     Cervical back: Normal range of motion and neck supple.     Right lower leg: No edema.     Left lower leg: No edema.  Skin:    General: Skin is warm and dry.  Neurological:     General: No focal deficit present.     Mental Status: He is alert and oriented to person, place, and time.     Cranial Nerves: No cranial nerve deficit.     Motor: No weakness.  Psychiatric:        Mood and Affect: Mood normal.        Behavior: Behavior normal.      Results for orders placed or performed in visit on 02/14/24  POCT CBG (Fasting - Glucose)  Result Value Ref Range   Glucose Fasting, POC 108 (A) 70 - 99 mg/dL  POC CREATINE & ALBUMIN ,URINE  Result Value Ref Range   Microalbumin Ur, POC 150 mg/L   Creatinine, POC 200 mg/dL   Albumin /Creatinine Ratio, Urine, POC 30-300         Assessment & Plan:  Russell Ray was seen today for follow-up.  Type 2 diabetes mellitus without complication, without long-term current use of insulin  (HCC) -     POCT CBG (Fasting - Glucose) -     POC CREATINE & ALBUMIN ,URINE -     Hemoglobin A1c  Complicated UTI (urinary tract infection)  Essential (primary) hypertension  Mixed hyperlipidemia  Hyperlipidemia, unspecified hyperlipidemia type -     Lipid panel    Problem List Items Addressed This Visit       Cardiovascular and Mediastinum   Essential (primary)  hypertension     Endocrine   Type 2 diabetes mellitus without complications (HCC) - Primary   Relevant Orders   POCT CBG (Fasting - Glucose) (Completed)   POC CREATINE & ALBUMIN ,URINE (Completed)     Genitourinary   Complicated UTI (urinary tract infection)   Other Visit Diagnoses       Mixed hyperlipidemia         Hyperlipidemia, unspecified hyperlipidemia type           Return in about 3 months (around 05/14/2024).   Total time spent: 20 minutes. This time includes review of previous notes and results and patient face to face interaction during today'Russell Ray visit.    Sherrill Cinderella Perry, MD  02/14/2024   This document may have been prepared by [facility] Voice Recognition software and as such may include unintentional dictation errors.     [1]  Allergies Allergen Reactions   Lovenox  [Enoxaparin  Sodium] Itching   Enoxaparin  Anxiety, Dermatitis, Hives, Itching, Nausea Only and Swelling  [2]  Outpatient Medications Prior to Visit  Medication Sig   acetaminophen  (TYLENOL ) 325 MG tablet Take 650 mg by mouth every 4 (four) hours as needed for moderate pain.   amiodarone  (PACERONE ) 200 MG tablet TAKE 1 TABLET BY MOUTH TWICE A DAY   budesonide -formoterol  (SYMBICORT ) 80-4.5 MCG/ACT inhaler Inhale 2 puffs into the lungs 2 (two) times daily.   celecoxib  (CELEBREX ) 400 MG capsule TAKE 1 CAPSULE BY MOUTH DAILY.   ibandronate  (BONIVA ) 150 MG tablet Take 1 tablet (150 mg total) by mouth every 30 (thirty)  days. Take in the morning with a full glass of water, on an empty stomach, and do not take anything else by mouth or lie down for the next 30 min.   LORazepam  (ATIVAN ) 1 MG tablet Take 1 tablet (1 mg total) by mouth 2 (two) times daily.   magnesium  oxide (MAG-OX) 400 MG tablet TAKE 2 TABLETS BY MOUTH TWICE A DAY   ondansetron  (ZOFRAN ) 4 MG tablet TAKE 1 TABLET BY MOUTH TWICE A DAY   rosuvastatin  (CRESTOR ) 20 MG tablet TAKE 1 TABLET BY MOUTH EVERY DAY IN THE EVENING   tirzepatide   (MOUNJARO ) 5 MG/0.5ML Pen INJECT 5 MG SUBCUTANEOUSLY WEEKLY   warfarin (COUMADIN ) 2 MG tablet TAKE 1 TABLET BY MOUTH DAILY OR AS DIRECTED   cetirizine  (ZYRTEC  ALLERGY) 10 MG tablet Take 1 tablet (10 mg total) by mouth daily. (Patient not taking: Reported on 02/14/2024)   fluticasone  (FLONASE ) 50 MCG/ACT nasal spray Place 1 spray into both nostrils daily. (Patient not taking: Reported on 02/14/2024)   No facility-administered medications prior to visit.   "

## 2024-02-18 ENCOUNTER — Ambulatory Visit: Admitting: Urology

## 2024-02-20 ENCOUNTER — Other Ambulatory Visit: Payer: Self-pay | Admitting: Internal Medicine

## 2024-02-23 ENCOUNTER — Other Ambulatory Visit: Payer: Self-pay | Admitting: Cardiovascular Disease

## 2024-03-13 ENCOUNTER — Encounter: Payer: Self-pay | Admitting: Internal Medicine

## 2024-03-13 ENCOUNTER — Ambulatory Visit: Admitting: Urology

## 2024-03-13 ENCOUNTER — Other Ambulatory Visit: Payer: Self-pay

## 2024-03-13 DIAGNOSIS — E1129 Type 2 diabetes mellitus with other diabetic kidney complication: Secondary | ICD-10-CM

## 2024-03-13 DIAGNOSIS — E785 Hyperlipidemia, unspecified: Secondary | ICD-10-CM

## 2024-03-13 MED ORDER — KERENDIA 10 MG PO TABS: 10.0000 mg | ORAL_TABLET | Freq: Every day | ORAL | 1 refills | Status: AC

## 2024-03-16 MED ORDER — WARFARIN SODIUM 2 MG PO TABS: ORAL_TABLET | ORAL | 1 refills | Status: AC

## 2024-03-16 MED ORDER — ROSUVASTATIN CALCIUM 20 MG PO TABS: 20.0000 mg | ORAL_TABLET | Freq: Every day | ORAL | 3 refills | Status: AC

## 2024-03-16 NOTE — Progress Notes (Addendum)
 Russell Ray                                          MRN: [id number]   03/16/2024   The [facility] Quality Team Specialist reviewed this patient medical record for the purposes of chart review for care gap closure. The following were reviewed: chart review for care gap closure-diabetic eye exam.  Russell Ray                                          MRN: [id number]   04/03/2024   The [facility] Quality Team Specialist reviewed this patient medical record for the purposes of chart review for care gap closure. The following were reviewed: abstraction for care gap closure-kidney health evaluation for diabetes:eGFR  and uACR.    [facility] Quality Team

## 2024-03-17 ENCOUNTER — Other Ambulatory Visit: Payer: Self-pay | Admitting: Cardiology

## 2024-03-17 MED ORDER — WARFARIN SODIUM 1 MG PO TABS: 1.0000 mg | ORAL_TABLET | Freq: Every day | ORAL | 0 refills | Status: AC

## 2024-03-18 ENCOUNTER — Other Ambulatory Visit: Payer: Self-pay | Admitting: Cardiovascular Disease

## 2024-03-18 DIAGNOSIS — I4891 Unspecified atrial fibrillation: Secondary | ICD-10-CM

## 2024-03-19 ENCOUNTER — Other Ambulatory Visit: Payer: Self-pay

## 2024-03-19 ENCOUNTER — Other Ambulatory Visit: Payer: Self-pay | Admitting: Cardiology

## 2024-03-19 DIAGNOSIS — I4891 Unspecified atrial fibrillation: Secondary | ICD-10-CM

## 2024-03-19 MED ORDER — METOPROLOL SUCCINATE ER 50 MG PO TB24: 50.0000 mg | ORAL_TABLET | Freq: Every day | ORAL | 0 refills | Status: AC

## 2024-04-30 ENCOUNTER — Ambulatory Visit: Admitting: Urology

## 2024-05-13 ENCOUNTER — Ambulatory Visit: Admitting: Internal Medicine
